# Patient Record
Sex: Female | Born: 1980 | Race: Black or African American | Hispanic: No | Smoking: Former smoker
Health system: Southern US, Community
[De-identification: ages and names within clinical notes are randomized; demographics above are authoritative.]

## PROBLEM LIST (undated history)

## (undated) DIAGNOSIS — H547 Unspecified visual loss: Secondary | ICD-10-CM

## (undated) DIAGNOSIS — K760 Fatty (change of) liver, not elsewhere classified: Secondary | ICD-10-CM

## (undated) DIAGNOSIS — H409 Unspecified glaucoma: Secondary | ICD-10-CM

## (undated) DIAGNOSIS — H3530 Unspecified macular degeneration: Secondary | ICD-10-CM

## (undated) DIAGNOSIS — G629 Polyneuropathy, unspecified: Secondary | ICD-10-CM

## (undated) DIAGNOSIS — H269 Unspecified cataract: Secondary | ICD-10-CM

## (undated) DIAGNOSIS — I10 Essential (primary) hypertension: Secondary | ICD-10-CM

## (undated) DIAGNOSIS — E785 Hyperlipidemia, unspecified: Secondary | ICD-10-CM

## (undated) DIAGNOSIS — D649 Anemia, unspecified: Secondary | ICD-10-CM

## (undated) DIAGNOSIS — F329 Major depressive disorder, single episode, unspecified: Secondary | ICD-10-CM

## (undated) DIAGNOSIS — F32A Depression, unspecified: Secondary | ICD-10-CM

## (undated) DIAGNOSIS — E119 Type 2 diabetes mellitus without complications: Secondary | ICD-10-CM

## (undated) HISTORY — PX: OTHER SURGICAL HISTORY: SHX169

## (undated) HISTORY — PX: RETINAL LASER PROCEDURE: SHX2339

## (undated) HISTORY — DX: Unspecified glaucoma: H40.9

## (undated) HISTORY — DX: Essential (primary) hypertension: I10

## (undated) HISTORY — DX: Anemia, unspecified: D64.9

## (undated) HISTORY — DX: Hyperlipidemia, unspecified: E78.5

---

## 2000-01-24 ENCOUNTER — Encounter: Admission: RE | Admit: 2000-01-24 | Discharge: 2000-04-23 | Payer: Self-pay | Admitting: Internal Medicine

## 2002-10-29 ENCOUNTER — Emergency Department (HOSPITAL_COMMUNITY): Admission: EM | Admit: 2002-10-29 | Discharge: 2002-10-29 | Payer: Self-pay | Admitting: Emergency Medicine

## 2002-11-21 ENCOUNTER — Emergency Department (HOSPITAL_COMMUNITY): Admission: EM | Admit: 2002-11-21 | Discharge: 2002-11-21 | Payer: Self-pay | Admitting: Emergency Medicine

## 2003-09-05 ENCOUNTER — Emergency Department (HOSPITAL_COMMUNITY): Admission: EM | Admit: 2003-09-05 | Discharge: 2003-09-05 | Payer: Self-pay | Admitting: Emergency Medicine

## 2004-04-12 ENCOUNTER — Emergency Department (HOSPITAL_COMMUNITY): Admission: EM | Admit: 2004-04-12 | Discharge: 2004-04-13 | Payer: Self-pay | Admitting: *Deleted

## 2004-05-26 ENCOUNTER — Emergency Department (HOSPITAL_COMMUNITY): Admission: EM | Admit: 2004-05-26 | Discharge: 2004-05-26 | Payer: Self-pay | Admitting: Emergency Medicine

## 2004-08-23 ENCOUNTER — Emergency Department (HOSPITAL_COMMUNITY): Admission: EM | Admit: 2004-08-23 | Discharge: 2004-08-23 | Payer: Self-pay | Admitting: Emergency Medicine

## 2004-10-01 ENCOUNTER — Emergency Department (HOSPITAL_COMMUNITY): Admission: EM | Admit: 2004-10-01 | Discharge: 2004-10-01 | Payer: Self-pay | Admitting: Emergency Medicine

## 2005-01-31 ENCOUNTER — Emergency Department (HOSPITAL_COMMUNITY): Admission: EM | Admit: 2005-01-31 | Discharge: 2005-01-31 | Payer: Self-pay | Admitting: Emergency Medicine

## 2005-07-19 ENCOUNTER — Emergency Department (HOSPITAL_COMMUNITY): Admission: EM | Admit: 2005-07-19 | Discharge: 2005-07-19 | Payer: Self-pay | Admitting: Emergency Medicine

## 2005-10-14 ENCOUNTER — Emergency Department (HOSPITAL_COMMUNITY): Admission: EM | Admit: 2005-10-14 | Discharge: 2005-10-14 | Payer: Self-pay | Admitting: Emergency Medicine

## 2006-02-03 ENCOUNTER — Emergency Department (HOSPITAL_COMMUNITY): Admission: EM | Admit: 2006-02-03 | Discharge: 2006-02-03 | Payer: Self-pay | Admitting: Emergency Medicine

## 2006-02-06 ENCOUNTER — Emergency Department (HOSPITAL_COMMUNITY): Admission: EM | Admit: 2006-02-06 | Discharge: 2006-02-06 | Payer: Self-pay | Admitting: Emergency Medicine

## 2006-02-07 ENCOUNTER — Ambulatory Visit: Payer: Self-pay | Admitting: Internal Medicine

## 2006-02-08 ENCOUNTER — Emergency Department (HOSPITAL_COMMUNITY): Admission: EM | Admit: 2006-02-08 | Discharge: 2006-02-08 | Payer: Self-pay | Admitting: Emergency Medicine

## 2006-02-11 ENCOUNTER — Ambulatory Visit: Payer: Self-pay | Admitting: Internal Medicine

## 2006-02-14 ENCOUNTER — Ambulatory Visit: Payer: Self-pay | Admitting: Internal Medicine

## 2006-02-17 ENCOUNTER — Ambulatory Visit: Payer: Self-pay | Admitting: Internal Medicine

## 2006-02-19 ENCOUNTER — Ambulatory Visit: Payer: Self-pay | Admitting: Internal Medicine

## 2006-02-24 ENCOUNTER — Ambulatory Visit: Payer: Self-pay | Admitting: Internal Medicine

## 2006-02-26 ENCOUNTER — Ambulatory Visit: Payer: Self-pay | Admitting: Internal Medicine

## 2006-03-05 ENCOUNTER — Ambulatory Visit: Payer: Self-pay | Admitting: Internal Medicine

## 2006-03-11 ENCOUNTER — Ambulatory Visit: Payer: Self-pay | Admitting: Family Medicine

## 2006-03-21 ENCOUNTER — Ambulatory Visit: Payer: Self-pay | Admitting: Internal Medicine

## 2006-08-27 ENCOUNTER — Ambulatory Visit: Payer: Self-pay | Admitting: Internal Medicine

## 2006-12-03 ENCOUNTER — Emergency Department (HOSPITAL_COMMUNITY): Admission: EM | Admit: 2006-12-03 | Discharge: 2006-12-03 | Payer: Self-pay | Admitting: Emergency Medicine

## 2007-02-03 ENCOUNTER — Emergency Department (HOSPITAL_COMMUNITY): Admission: EM | Admit: 2007-02-03 | Discharge: 2007-02-03 | Payer: Self-pay | Admitting: Emergency Medicine

## 2007-11-05 ENCOUNTER — Emergency Department (HOSPITAL_COMMUNITY): Admission: EM | Admit: 2007-11-05 | Discharge: 2007-11-05 | Payer: Self-pay | Admitting: Emergency Medicine

## 2008-08-15 ENCOUNTER — Emergency Department (HOSPITAL_COMMUNITY): Admission: EM | Admit: 2008-08-15 | Discharge: 2008-08-15 | Payer: Self-pay | Admitting: Emergency Medicine

## 2008-08-29 ENCOUNTER — Inpatient Hospital Stay (HOSPITAL_COMMUNITY): Admission: EM | Admit: 2008-08-29 | Discharge: 2008-09-03 | Payer: Self-pay | Admitting: Emergency Medicine

## 2008-08-31 ENCOUNTER — Ambulatory Visit: Payer: Self-pay | Admitting: Gastroenterology

## 2008-09-01 ENCOUNTER — Ambulatory Visit: Payer: Self-pay | Admitting: Internal Medicine

## 2008-09-03 ENCOUNTER — Ambulatory Visit: Payer: Self-pay | Admitting: Internal Medicine

## 2009-01-18 ENCOUNTER — Emergency Department (HOSPITAL_COMMUNITY): Admission: EM | Admit: 2009-01-18 | Discharge: 2009-01-18 | Payer: Self-pay | Admitting: Emergency Medicine

## 2009-02-07 ENCOUNTER — Emergency Department (HOSPITAL_COMMUNITY): Admission: EM | Admit: 2009-02-07 | Discharge: 2009-02-07 | Payer: Self-pay | Admitting: Emergency Medicine

## 2009-05-13 ENCOUNTER — Emergency Department (HOSPITAL_COMMUNITY): Admission: EM | Admit: 2009-05-13 | Discharge: 2009-05-13 | Payer: Self-pay | Admitting: Emergency Medicine

## 2009-07-10 ENCOUNTER — Emergency Department (HOSPITAL_COMMUNITY): Admission: EM | Admit: 2009-07-10 | Discharge: 2009-07-10 | Payer: Self-pay | Admitting: Emergency Medicine

## 2009-07-17 ENCOUNTER — Emergency Department (HOSPITAL_COMMUNITY): Admission: EM | Admit: 2009-07-17 | Discharge: 2009-07-17 | Payer: Self-pay | Admitting: Emergency Medicine

## 2009-07-25 ENCOUNTER — Emergency Department (HOSPITAL_COMMUNITY): Admission: EM | Admit: 2009-07-25 | Discharge: 2009-07-25 | Payer: Self-pay | Admitting: Emergency Medicine

## 2009-08-08 ENCOUNTER — Emergency Department (HOSPITAL_COMMUNITY): Admission: EM | Admit: 2009-08-08 | Discharge: 2009-08-08 | Payer: Self-pay | Admitting: Emergency Medicine

## 2010-04-03 ENCOUNTER — Emergency Department (HOSPITAL_COMMUNITY): Admission: EM | Admit: 2010-04-03 | Discharge: 2010-04-03 | Payer: Self-pay | Admitting: Emergency Medicine

## 2010-04-26 ENCOUNTER — Emergency Department (HOSPITAL_COMMUNITY): Admission: EM | Admit: 2010-04-26 | Discharge: 2010-04-26 | Payer: Self-pay | Admitting: Emergency Medicine

## 2011-02-02 ENCOUNTER — Inpatient Hospital Stay (INDEPENDENT_AMBULATORY_CARE_PROVIDER_SITE_OTHER)
Admission: RE | Admit: 2011-02-02 | Discharge: 2011-02-02 | Disposition: A | Discharge status: Home / Self Care | Payer: Self-pay | Source: Ambulatory Visit | Attending: Family Medicine | Admitting: Family Medicine

## 2011-02-02 DIAGNOSIS — L0100 Impetigo, unspecified: Secondary | ICD-10-CM

## 2011-02-02 LAB — POCT PREGNANCY, URINE: Preg Test, Ur: NEGATIVE

## 2011-03-09 LAB — RAPID STREP SCREEN (MED CTR MEBANE ONLY): Streptococcus, Group A Screen (Direct): NEGATIVE

## 2011-03-11 LAB — URINALYSIS, ROUTINE W REFLEX MICROSCOPIC
Bilirubin Urine: NEGATIVE
Glucose, UA: 500 mg/dL — AB
Hgb urine dipstick: NEGATIVE
Nitrite: NEGATIVE
Protein, ur: NEGATIVE mg/dL
Specific Gravity, Urine: >1.03 — ABNORMAL HIGH (ref 1.005–1.030)
Urobilinogen, UA: 0.2 mg/dL (ref 0.0–1.0)
pH: 5.5 (ref 5.0–8.0)

## 2011-03-11 LAB — POCT CARDIAC MARKERS
CKMB, poc: <1 ng/mL — ABNORMAL LOW (ref 1.0–8.0)
Myoglobin, poc: 33.4 ng/mL (ref 12–200)
Troponin i, poc: <0.05 ng/mL (ref 0.00–0.09)

## 2011-03-11 LAB — DIFFERENTIAL
Basophils Absolute: 0 10*3/uL (ref 0.0–0.1)
Basophils Relative: 1 % (ref 0–1)
Eosinophils Absolute: 0.1 10*3/uL (ref 0.0–0.7)
Eosinophils Relative: 1 % (ref 0–5)
Lymphocytes Relative: 38 % (ref 12–46)
Lymphs Abs: 2.8 10*3/uL (ref 0.7–4.0)
Monocytes Absolute: 0.4 10*3/uL (ref 0.1–1.0)
Monocytes Relative: 6 % (ref 3–12)
Neutro Abs: 4.1 10*3/uL (ref 1.7–7.7)
Neutrophils Relative %: 55 % (ref 43–77)

## 2011-03-11 LAB — CBC
HCT: 40.4 % (ref 36.0–46.0)
Hemoglobin: 14 g/dL (ref 12.0–15.0)
MCHC: 34.7 g/dL (ref 30.0–36.0)
MCV: 80.8 fL (ref 78.0–100.0)
Platelets: 233 10*3/uL (ref 150–400)
RBC: 5 MIL/uL (ref 3.87–5.11)
RDW: 14.7 % (ref 11.5–15.5)
WBC: 7.4 10*3/uL (ref 4.0–10.5)

## 2011-03-11 LAB — BASIC METABOLIC PANEL
BUN: 11 mg/dL (ref 6–23)
CO2: 24 mEq/L (ref 19–32)
Calcium: 9 mg/dL (ref 8.4–10.5)
Chloride: 106 mEq/L (ref 96–112)
Creatinine, Ser: 0.65 mg/dL (ref 0.4–1.2)
GFR calc Af Amer: >60 mL/min (ref >60)
GFR calc non Af Amer: >60 mL/min (ref >60)
Glucose, Bld: 276 mg/dL — ABNORMAL HIGH (ref 70–99)
Potassium: 4.2 mEq/L (ref 3.5–5.1)
Sodium: 138 mEq/L (ref 135–145)

## 2011-03-11 LAB — GLUCOSE, CAPILLARY: Glucose-Capillary: 278 mg/dL — ABNORMAL HIGH (ref 70–99)

## 2011-03-19 LAB — GLUCOSE, CAPILLARY: Glucose-Capillary: 195 mg/dL — ABNORMAL HIGH (ref 70–99)

## 2011-04-16 NOTE — Group Therapy Note (Signed)
NAME:  Rachel George, TAKEDA                ACCOUNT NO.:  [id number]   MEDICAL RECORD NO.:  [id number]          PATIENT TYPE:  INP   LOCATION:  A338                          FACILITY:  [facility]   PHYSICIAN:  Margaretmary Dys, M.D.DATE OF BIRTH:  31-Jul-1981   DATE OF PROCEDURE:  08/30/2008  DATE OF DISCHARGE:                                 PROGRESS NOTE   SUBJECTIVE:  The patient feels slightly better today.  She is having no  more vomiting but her nausea is still present.  She has had diarrhea,  but this has also improved.  The patient had an ultrasound today which  was unremarkable.  The patient is generally very unkempt due to poor  hygiene and her uncontrolled diabetes.   The patient also has some skin excoriation around her vulvar area,  likely due to Candida.   OBJECTIVE:  Conscious, alert, comfortable; not in acute distress.  Well-  oriented in time, place and person.  VITAL SIGNS:  Blood pressure 96/59, with a pulse of 117.  Respiratory is  20.  Temperature 98.5 degrees Fahrenheit.  Oxygen saturation was 98% on  room air.  Blood sugar is now down to 160.  HEENT:  Exam normocephalic, atraumatic.  Oral mucosa was moist.  Poor  oral dentition.  The patient is generally unkempt.  NECK:  Supple.  No JVD or lymphadenopathy.  LUNGS:  Clear clinically.  Good air entry bilaterally.  HEART:  S1 and S2, tachycardiac.  No S3, murmurs, gallops or rubs.  ABDOMEN:  Soft, obese.  Bowel sounds were positive.  No masses palpable.  EXTREMITIES:  No edema.  SKIN:  The patient has multiple small blistering lesions, also there  were some on the anterior chest with an exudate.  The patient also has  some excoriations around her vulva area.  CNS:  Exam was grossly intact with no focal neurological deficits.   LABORATORY/DIAGNOSTIC DATA:  White blood count 19.6, hemoglobin 13.6,  hematocrit 40.2, platelet count 130 with 89% neutrophils.  PT 15.2, INR  1.2.  Glucose levels range between 161-203.  Sodium  430, potassium 3.1,  chloride 98, CO2 22, glucose 229, BUN 36, creatinine 1.48.  AST 21, ALT  14, calcium 7.8, albumin 1.9.  Cholesterol 128, triglycerides 343, LDL  38.   ASSESSMENT AND PLAN:  1. Acute gastroenteritis, possibly viral.  2. Poorly controlled type 2 diabetes with nonketotic hyperglycemic      state.  3. Acute renal insufficiency, likely secondary to prerenal azotemia;      although cannot rule out possibility of early diabetic nephropathy.      The patient has protein in her urine.  4. Multiple skin infections with possible MRSA and also Candida      infection.  5. Sepsis  6. Dehydration.   PLAN:  1. Will continue IV fluid hydration with normal saline at 150 an hour.      I will replace potassium.  Renal ultrasound was negative.  Liver      function test unremarkable today.  2. Will continue current IV antibiotics with vancomycin and Zosyn.  3. We  will continue to monitor BUN and creatinine.  4. I will stop her insulin infusion and place her on a sliding scale      and also place her on Lantus insulin -- depending on how much      sliding scale she requires.  A hemoglobin A1c is not available at      this time.  5. Will continue antibiotics for urinary tract infection, as mentioned      above.  6. Will start the patient on nystatin cream b.i.d. for her vulvar skin      infection.   DISPOSITION:  The patient will remain on the medical floor for now.  We  will continue on IV fluids and continue to monitor her blood pressure.  Extensive counseling was provided to the patient regarding compliance to  the medications, dietary and lifestyle changes and weight loss.      Margaretmary Dys, M.D.  Electronically Signed     AM/MEDQ  D:  08/30/2008  T:  08/30/2008  Job:  [id number]

## 2011-04-16 NOTE — H&P (Signed)
NAME:  Rachel George, Rachel George                ACCOUNT NO.:  [id number]   MEDICAL RECORD NO.:  [id number]          PATIENT TYPE:  INP   LOCATION:  A338                          FACILITY:  [facility]   PHYSICIAN:  Osvaldo Shipper, MD     DATE OF BIRTH:  [birth year]   DATE OF ADMISSION:  08/29/2008  DATE OF DISCHARGE:  LH                              HISTORY & PHYSICAL   The patient does not have a PMD.   ADMISSION DIAGNOSES:  1. Gastroenteritis, acute.  2. Hyperglycemia, nonketotic.  3. Acute renal failure, likely secondary to prerenal azotemia.  4. Possible skin infection with methicillin resistant Staphylococcus      aureus.  5. Sepsis.  6. Severe Dehydration.   CHIEF COMPLAINT:  Nausea, vomiting, diarrhea, abdominal pain since three  days.   HISTORY OF PRESENT ILLNESS:  The patient is a 30 year old African  American female who is morbidly obese with a history of diabetes but she  has been untreated for the past four years.  The patient does not have a  PMD and has not been following up with anybody.  The patient was in her  usual state of health until about three days ago when she started  feeling bad.  She started feeling weak and this was followed by onset  of nausea and vomiting.  She has vomited numerous times, watery clear  liquid.  Then diarrhea started this morning.  She has had about five  episodes of watery stool which have been black in color.  She has been  taking Pepto Bismol which could account for the black stools.  She  denied any blood in the stools.  She had also been coughing green  expectoration, sometimes tinged with blood.  She denied any chills but  has been having fever but has not taken her temperature.  She has had  decreased p.o. intake as well for the past two days.  She also admits to  having some abdominal pain in the upper abdomen along with this nausea  and vomiting.  With the cough she experiences sharp chest pain but  otherwise does not have any pain.  She has  been having some shortness of  breath as well.  She denies any sick contacts.   She mentioned that she also had a boil on her chest wall area about two  days ago and that seems to be resolving.  She had a history of numerous  skin infections which have been I&D'd in the past.  All this is very  suspicious for MRSA.   MEDICATIONS AT HOME:  Nothing on a scheduled basis.  She used to be on  Glucophage in 2004 for her diabetes but has not taken anything since  then.  She is on Tylenol and Pepto Bismol as needed.   ALLERGIES:  SHELLFISH.   PAST MEDICAL HISTORY:  1. Positive for diabetes, untreated.  2. Hypertension.  3. Bronchitis.  4. Skin infections.   PAST SURGICAL HISTORY:  Local I&D's for the skin infections.  She still  has her gallbladder and appendix.   SOCIAL HISTORY:  She lives in [place] with significant other.  She  works at the [employer].  She smokes half a pack of cigarettes on a daily  basis.  Denies any alcohol use or illicit drug use.  Independent with  her daily activities.   FAMILY HISTORY:  Positive for diabetes.   REVIEW OF SYSTEMS:  GENERAL:  Positive for weakness, malaise.  HEENT:  Unremarkable.  CARDIOVASCULAR:  Unremarkable.  RESPIRATORY:  As in HPI.  GASTROINTESTINAL:  As in HPI.  GENITOURINARY:  Positive for decreased  quantity of urine and dark urine, otherwise unremarkable.  MUSCULOSKELETAL:  Unremarkable.  DERMATOLOGIC:  As in HPI.  PSYCHIATRIC:  Unremarkable.  NEUROLOGIC:  Unremarkable.  Other systems unremarkable.   PHYSICAL EXAMINATION:  VITAL SIGNS:  Temperature 98.2, blood pressure  109/53, heart rate when she came in was 153, respiratory rate 22,  saturation 97%.  Currently heart rate is in the 130s, irregular on the  monitor.  GENERAL:  This is a morbidly obese black female, looks a little bit sick  but in no distress.  HEENT:  There is no pallor, no icterus.  Oral mucous membranes very dry.  No oral lesions are noted.  NECK:  Soft and supple.   No thyromegaly is appreciated.  LUNGS:  Clear to auscultation bilaterally.  No wheezing, rales, or  rhonchi.  No dullness to percussion.  CARDIOVASCULAR:  Tachycardic, regular, no murmurs appreciated.  No S3 or  S4.  Heart sounds are somewhat distant because of her size.  ABDOMEN:  Soft.  There is tenderness in the epigastric and also over the  right upper quadrant.  No rebound or guarding is present.  She does have  normal bowel sounds.  No lower abdominal tenderness is elicited.  No  mass or organomegaly is present.  EXTREMITIES:  Did not show any edema.  Musculoskeletal exam otherwise  unremarkable.  DERMATOLOGIC:  Revealed multiple small punctate skin lesions.  The one  most prominent was her anterior chest where a small amount of exudate is  elicited.  There is no active drainage.  No significant erythema is  noted as well.  The area is tender to palpation.  NEUROLOGIC:  She is alert, oriented x3.  No focal neurological deficits  are present.   LABORATORY DATA:  Her white count was 23,500 with 92% neutrophils, more  than 20 bands  reported.  Toxic granulation is noted.  Hemoglobin is  14.9, platelet count is 141.  Coags are not being checked.  Sodium is  125, potassium is 3.8, chloride is 90, bicarb is 21, glucose is 495, BUN  is 34, creatinine is 1.96, GFR is 37, calcium is 8.1.  UA shows turbid  yellow urine, glucosuria, trace ketones, trace blood, some protein,  small leukocytes, 3-6 wbc's, many bacteria noted.   Imaging studies include chest x-ray which showed no evidence for  cardiopulmonary process.   She also had an EKG which shows sinus tachycardia rhythm with a rate of  140.  Axis was normal and intervals appear to be in the normal range.  No Q-waves are present.  No concerning ST or T-wave changes present.   ASSESSMENT:  This is a 30 year old African American female who has  untreated diabetes, was morbidly obese, and presents with a 3-day  history of nausea,  vomiting, diarrhea.  These symptoms are consistent  with gastroenteritis.  She also had right upper quadrant abdominal pain  which is worrisome for gallbladder disease considering her obesity and  the history of  diabetes.  She also has skin infections which is  concerning for MRSA.  She is very dehydrated  with acute renal failure.  She also has criteria fitting systemic inflammatory response syndrome  (SIRS) and possibly even sepsis. She has hyperglycemia due to untreated  diabetes.   PLAN:  1. Acute gastroenteritis will be treated symptomatically with      antiemetics and antidiarrheal agents.  Her black stool is because      of the Pepto Bismol and not because of any bleeding.  We will      monitor hemoglobin closely.  PPIs will be initiated.  2. Abdominal pain.  LFTs and lipase will be checked.  Ultrasound of      the abdomen will be ordered for tomorrow to evaluate for      cholecystitis.  A surgical consultation may be needed.  If the      ultrasound is negative and if she continues to have her symptoms a      GI consultation will be obtained and consideration to getting a CAT      scan will be given.  3. Systemic inflammatory response syndrome/Sepsis.  This was likely      from skin infections from possible urinary tract infection.  We      will put her on broad spectrum antibiotics and vancomycin for      presumed MRSA.  Blood cultures will be sent off.  Nasal swab for      MRSA screen will be done.  Swab from the skin will be sent off as      well.  Because of the possibility of gallbladder disease I will      also start her on Zosyn.  Once ultrasound comes back a narrow      spectrum treatment can be initiated. Aggresive fluid resuscitation      will be initiated.  4. Hyperglycemia.  Since she is having nausea and vomiting will      initiate a Glucommander which will provide a little bit more      consistent glycemic control.  5. Acute renal failure.  Likely from  dehydration.  We will initiate IV      fluids and monitor this closely.  6. Hyponatremia because of hypovolemia.  This will also be monitored      closely.   The patient has been counseled regarding her diabetes.  I have explained  to her that if she ignores her diabetes and does not get treated she  will end up getting multiple complications in the future.  All this has  been emphasized very strongly to the individual.   Stool studies will be sent off.  Contact precautions.  DVT prophylaxis.   Further management decisions will depend on results of further testing  and patient's response to treatment.      Osvaldo Shipper, MD  Electronically Signed     GK/MEDQ  D:  08/29/2008  T:  08/29/2008  Job:  [id number]

## 2011-04-16 NOTE — Group Therapy Note (Signed)
NAME:  Rachel George, SLAPE NO.:  [id number]   MEDICAL RECORD NO.:  [id number]          PATIENT TYPE:  INP   LOCATION:  A338                          FACILITY:  [facility]   PHYSICIAN:  Skeet Latch, DO    DATE OF BIRTH:  [birth year]   DATE OF PROCEDURE:  08/31/2008  DATE OF DISCHARGE:                                 PROGRESS NOTE   SUBJECTIVE:  The patient states that she still is having abdominal  discomfort with nausea and vomiting.  The patient states that she cannot  tolerate any liquids or solids at this time.  She has epigastric and  lower abdominal discomfort.  The patient also admits to having skin  lesions after been given Tramadol after going to the dentist for a  dental procedure.  The patient also admits to not being on diabetes  medications since 2004, has not seen a physician since then.   OBJECTIVE:  GENERAL:  The patient is awake, alert and in no acute  distress.  VITAL SIGNS:  Temperature is 96.7, pulse 101, respirations 22, blood  pressure 96/57.  HEENT:  Head is normocephalic, atraumatic.  Oral mucosa is moist.  Patient does have lesions on her face.  NECK:  Soft, supple, nontender, nondistended.  LUNGS:  Clear to auscultation bilaterally.  No rales, rhonchi or  wheezing.  HEART:  Regular rate and rhythm.  No murmurs, rubs or gallops.  ABDOMEN:  Soft, obese, positive bowel sounds.  She has some epigastric  and lower abdominal tenderness on deep palpation.  EXTREMITIES: No cyanosis, clubbing or edema.  SKIN:  She does have blistering lesions on anterior chest wall.  Excoriations around her vulva.   LABORATORY DATA:  Sodium 138, potassium 3.2, chloride 106, CO2 21,  glucose 254, BUN 23, creatinine 1.11.  White count is 16.3, hemoglobin  13.1, hematocrit 37.6, platelet count 112,000.   ASSESSMENT AND PLAN:  1. Abdominal/epigastric pain.  At this time will increase her Protonix      to 40 mg IV twice daily.  Will also get a gastroenterology  consultation regarding her abdominal pain and chronic nausea and      vomiting.  The patient's on a liquid diet and we will continue this      until she is evaluated by gastroenterology.  2. Poorly controlled type 2 diabetes with nonketotic hyperglycemic      state. Will change her sliding scale insulin to resistant as well      as get a hemoglobin A1C in the A.M.  Will also consult diabetes      team.  Patient will need close followup and aggressive therapy for      her diabetes secondary to her young age.  Patient will also need an      eye exam, probably as an outpatient, to evaluate her retina.  3. For her multiple skin infections, so far wound cultures show a few      Staphylococcus Aureus.  At this time we will continue with her      vancomycin and Zosyn.  The patient does have an elevated  white      count.  If this continues to be elevated we may need to adjust her      antibiotics.  4. For her dehydration, the patient's BUN and creatinine seem to be      within normal range.  Will continue with IV hydration.  Will      increase her IV secondary to her __________urinary output.  Will      continue to monitor closely.  5. For her hypokalemia we will continue to replace as needed at this      time.  6. Will continue to encourage compliance with her medications as well      as her diet and lifestyle as well as weight loss.      Skeet Latch, DO  Electronically Signed     SM/MEDQ  D:  08/31/2008  T:  08/31/2008  Job:  [id number]

## 2011-04-16 NOTE — Consult Note (Signed)
NAME:  Rachel George, Rachel George                ACCOUNT NO.:  [id number]   MEDICAL RECORD NO.:  [id number]          PATIENT TYPE:  INP   LOCATION:  A338                          FACILITY:  [facility]   PHYSICIAN:  Kassie Mends, M.D.      DATE OF BIRTH:  [birth year]   DATE OF CONSULTATION:  08/31/2008  DATE OF DISCHARGE:                                 CONSULTATION   REASON FOR CONSULTATION:  Abdominal pain.   PHYSICIAN REQUESTING CONSULTATION:  Skeet Latch, DO with [facility].   HISTORY OF PRESENT ILLNESS:  The patient is a 30 year old African  American female with history of diabetes mellitus, who has been  noncompliant for the past 5 years, who was admitted with acute-onset  nausea, vomiting, diarrhea.  The patient states that she was not feeling  well last week.  She had been on some pain pills including tramadol and  hydrocodone for her toothache over the past couple of weeks.  She  thought the medication was causing her nausea.  By the weekend, she was  having severe nausea and started vomiting.  She had several episodes.  Denies any hematemesis.  She then developed diarrhea.  This persisted  for several days.  She finally sought medical attention the day of  admission.  She states she does have chronic early satiety.  She does  have chronic daily heartburn for which she takes an occasional Rolaids.  Denies any dysphagia or odynophagia.  She has chronic alternating  constipation and diarrhea.  With this episode of vomiting, she has had  some epigastric pain.  The pain is persistent.  Her vomiting is  improving at this point since admission, but then she still had some  vomiting yesterday.  She mainly consumes ibuprofen, sometimes 6 or more  pills daily for various aches and pain, and predominantly for  toothaches.  She does not always do it daily, but does it quite  frequently.  She states she has not taken any of her medications for  diabetes in over 5 years.  She has not been trying  to control it with  her diet either.   MEDICATIONS AT HOME:  1. Tylenol p.r.n.  2. Pepto-Bismol p.r.n.  3. Rolaids p.r.n.  4. Ibuprofen up to 6 daily.   ALLERGIES:  SHELLFISH.   PAST MEDICAL HISTORY:  1. Diabetes mellitus.  She has been noncompliant for over 5 years.  2. Hypertension.  3. Skin infections with history of I&D in the past.  4. Bronchitis.   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  She  smokes half pack of cigarettes daily.  Denies alcohol or drug use.  She  lives with her significant other here in [place].   FAMILY HISTORY:  Negative for colorectal cancer, peptic ulcer disease.  She states her brother has chronic diarrhea with incontinence and is  currently being worked up.   REVIEW OF SYSTEMS:  GI:  Per HPI.  CONSTITUTIONAL:  Denies any weight  loss.  CARDIOPULMONARY:  No chest pain, shortness of breath, or  palpitations.  She  has had some coughing since her vomiting.  GENITOURINARY:  She has dysuria.  No hematuria.  She complains of  vaginal itching.   PHYSICAL EXAMINATION:  VITAL SIGNS:  Temperature 96.7, pulse 101,  respirations 22, blood pressure 86/57, height 67 inches, weight 143.6  kg.  GENERAL:  Pleasant, morbidly obese black female in no acute distress.  SKIN:  Warm and dry.  No jaundice.  HEENT:  Sclerae nonicteric.  Oropharyngeal mucosa moist and pink.  CHEST:  Lungs are clear to auscultation.  CARDIAC:  Regular rate and rhythm.  No murmurs, rubs, or gallops.  ABDOMEN:  Positive bowel sounds.  Abdomen is obese.  She has mild  epigastric tenderness to deep palpation.  No rebound or guarding.  No  organomegaly or masses appreciated, limited due to body habitus.  She  has multiple skin lesions in various stages of healing, which appear to  be boils.  LOWER EXTREMITIES:  No edema.   LABORATORY DATA:  Sodium 138, potassium 3.2, BUN 23, creatinine 1.11, on  admission it was 1.96, and glucose 254.  White count on admission  was  23,500 and today 16,300, hemoglobin 13.1 and platelets 112,000.  TSH  1.333, INR 1.2, magnesium 1.9, lipase less than 10.  Total bilirubin  0.8, alkaline phosphatase was 145, down to 131 today.  AST 21, ALT 14,  albumin was 1.8.  Urinalysis was positive for lots of bacteria, 3-6  wbc's, trace blood and ketones, small leukocytes, and negative nitrites.  Hemoglobin A1c was 12.4.  Blood cultures are pending.  Nasal Staph  screen is pending.  Wound culture is growing a few Staph aureus.  Mono  pending.   Abdominal ultrasound revealed a common bile duct diameter of 5 mm and  fatty infiltration.  Chest x-ray was negative.   IMPRESSION:  The patient is a 30 year old lady, noncompliant diabetic  with 7-10-day history of nausea with vomiting.  She also had diarrhea  over the weekend, which has appeared to have resolved.  She may have had  acute gastroenteritis on top of chronic gastroparesis, gastroesophageal  reflux disease, or even peptic ulcer disease, given her history of  significant Advil use.  Based on her abdominal ultrasound, it does not  appear that she has acute cholecystitis.  The source of her significant  leukocytosis is unclear at this time.  The patient is being covered with  Zosyn, vancomycin.  Leukocytosis is improving.   RECOMMENDATIONS:  1. Agree with PPI b.i.d.  2. Replete potassium.  3. We would hold on EGD for now, but will consider prior to discharge.      We would like her current infectious process to be controlled, and      I do not have any clear evidence at this time that it is GI in      etiology.  If her abdominal pain does not improve with increase in      PPI therapy, she may need a CT of the abdomen and pelvis      as well.  4. We will monitor the patient over the next 24 hours.  Further      recommendations to follow.  I have discussed this case with Dr.      Cira Servant, who is in agreement with the plan.      Tana Coast, P.A.      Kassie Mends,  M.D.  Electronically Signed    LL/MEDQ  D:  08/31/2008  T:  09/01/2008  Job:  [id number]

## 2011-04-19 NOTE — Discharge Summary (Signed)
NAME:  Rachel George, AMMONS NO.:  [id number]   MEDICAL RECORD NO.:  [id number]          PATIENT TYPE:  INP   LOCATION:  A338                          FACILITY:  [facility]   PHYSICIAN:  Skeet Latch, DO    DATE OF BIRTH:  Mar 21, 1981   DATE OF ADMISSION:  08/29/2008  DATE OF DISCHARGE:  10/03/2009LH                               DISCHARGE SUMMARY   DISCHARGE DIAGNOSES:  1. Abdominal pain.  2. Poorly controlled type 2 diabetes.  3. Multiple skin infections.  4. Dehydration resolved.  5. Hypokalemia.   BRIEF HOSPITAL COURSE:  This is a 30 year old African American female  morbidly obese with history of diabetes treated for 4 years who  presented with some weakness, nausea, vomiting, and abdominal pain.  The  patient states that 3 days prior to be admitted, she was feeling weak,  then began to have nausea and vomiting and diarrhea.  The patient had  approximately 5 episodes of watery stools and became black in color.  The patient also admitted to having coughing spells that were  productive, but denied any chills or fevers.  The patient states she did  began to have abdominal pain in her upper abdomen with some nausea and  vomiting and some sharp chest discomfort.  The patient has history of  multiple skin infections.  She had boil on her chest area about 2 days  prior, this seem to be resolving.  The patient does have a history of an  IUD in the past that was suspicious for MRSA.  The patient presented  with above symptoms.  Initial labs showed a white count 23,500 with 92  neutrophils, hemoglobin 40.9, platelets 141.  Sodium was 125, potassium  3.8, glucose 495, BUN 34, creatinine 1.96.  Imaging studies showed no  acute changes.  EKG showed she was in sinus tachycardia.  The patient  was admitted for gastroenteritis, placed on antiemetics, antidiarrheals,  as well as PPI.  For abdominal pain, her LFTs and lipase were checked.  She had ultrasound of her abdomen, which  showed no evidence of  gallstones of biliary dilatation.  It showed a diffuse fatty  infiltration of the liver.   OTHER RADIOLOGIC STUDIES:  The patient did have a CT of her abdomen and  pelvis, which showed no acute findings.  It did show a 5.5-cm right  ovarian cyst slightly represent functional ovarian cysts.  Recommend  followup pelvic ultrasound in 6-8 weeks.  Secondary to her abdominal  pain, nausea, and vomiting, Gastroenterology was consulted who felt that  she may have acute gastroenteritis, chronic gastroparesis with GERD or  possible positive ulcer disease.  I recommended continue the patient on  her IV antibiotics as well as her PPI twice a day.  Her potassium was  repleted.  Recommended no EGD at that time.  The patient's symptoms  slowly recovered.  The patient was placed on IV insulin on discharge as  well as a sliding scale.  The patient was told to follow up with a  primary care physician.   Her medications on discharge include Lantus 50  units subcu daily,  Bactrim DS twice a day for 10 days, Prilosec 40 mg daily, and Humalog  sliding scale.   The patient is told to follow up with her primary care physician and the  patient also was told that she needed diabetes teaching.  The patient  was discharged to home in stable condition.      Skeet Latch, DO  Electronically Signed     SM/MEDQ  D:  10/19/2008  T:  10/19/2008  Job:  [id number]

## 2011-08-03 ENCOUNTER — Emergency Department (HOSPITAL_COMMUNITY)
Admission: EM | Admit: 2011-08-03 | Discharge: 2011-08-03 | Disposition: A | Discharge status: Home / Self Care | Payer: Self-pay | Attending: Emergency Medicine | Admitting: Emergency Medicine

## 2011-08-03 DIAGNOSIS — Z91199 Patient's noncompliance with other medical treatment and regimen due to unspecified reason: Secondary | ICD-10-CM | POA: Insufficient documentation

## 2011-08-03 DIAGNOSIS — R42 Dizziness and giddiness: Secondary | ICD-10-CM | POA: Insufficient documentation

## 2011-08-03 DIAGNOSIS — Z794 Long term (current) use of insulin: Secondary | ICD-10-CM | POA: Insufficient documentation

## 2011-08-03 DIAGNOSIS — E119 Type 2 diabetes mellitus without complications: Secondary | ICD-10-CM | POA: Insufficient documentation

## 2011-08-03 DIAGNOSIS — E669 Obesity, unspecified: Secondary | ICD-10-CM | POA: Insufficient documentation

## 2011-08-03 DIAGNOSIS — I10 Essential (primary) hypertension: Secondary | ICD-10-CM | POA: Insufficient documentation

## 2011-08-03 DIAGNOSIS — Z9119 Patient's noncompliance with other medical treatment and regimen: Secondary | ICD-10-CM | POA: Insufficient documentation

## 2011-08-03 LAB — GLUCOSE, CAPILLARY: Glucose-Capillary: 295 mg/dL — ABNORMAL HIGH (ref 70–99)

## 2011-09-02 LAB — PHOSPHORUS: Phosphorus: 3.3

## 2011-09-02 LAB — GLUCOSE, CAPILLARY
Glucose-Capillary: 131 — ABNORMAL HIGH
Glucose-Capillary: 192 — ABNORMAL HIGH
Glucose-Capillary: 196 — ABNORMAL HIGH
Glucose-Capillary: 266 — ABNORMAL HIGH
Glucose-Capillary: 291 — ABNORMAL HIGH
Glucose-Capillary: 307 — ABNORMAL HIGH
Glucose-Capillary: 373 — ABNORMAL HIGH
Glucose-Capillary: 375 — ABNORMAL HIGH
Glucose-Capillary: 404 — ABNORMAL HIGH
Glucose-Capillary: 408 — ABNORMAL HIGH
Glucose-Capillary: 193 — ABNORMAL HIGH
Glucose-Capillary: 193 — ABNORMAL HIGH
Glucose-Capillary: 196 — ABNORMAL HIGH
Glucose-Capillary: 328 — ABNORMAL HIGH
Glucose-Capillary: 329 — ABNORMAL HIGH

## 2011-09-02 LAB — URINALYSIS, ROUTINE W REFLEX MICROSCOPIC
Bilirubin Urine: NEGATIVE
Glucose, UA: >1000 — AB
Leukocytes, UA: NEGATIVE
Nitrite: NEGATIVE
Protein, ur: 30 — AB
Protein, ur: 30 — AB
Specific Gravity, Urine: 1.025
Urobilinogen, UA: 0.2
Urobilinogen, UA: 0.2
pH: 5

## 2011-09-02 LAB — CBC
HCT: 37.6
HCT: 40.2
HCT: 36.3
Hemoglobin: 13.6
Hemoglobin: 14.9
Hemoglobin: 12.4
Hemoglobin: 12.6
MCHC: 33.4
MCHC: 34
MCHC: 34.2
MCV: 79.9
MCV: 80.7
Platelets: 112 — ABNORMAL LOW
Platelets: 130 — ABNORMAL LOW
Platelets: 141 — ABNORMAL LOW
Platelets: 158
RBC: 5.45 — ABNORMAL HIGH
RBC: 4.51
RBC: 4.95
RDW: 14
RDW: 14.3
RDW: 14.4
RDW: 14.6
RDW: 14.8
WBC: 19.6 — ABNORMAL HIGH
WBC: 13.2 — ABNORMAL HIGH

## 2011-09-02 LAB — URINE CULTURE
Colony Count: 30000
Colony Count: >=100000
Special Requests: NEGATIVE

## 2011-09-02 LAB — HEPATIC FUNCTION PANEL
Albumin: 2.2 — ABNORMAL LOW
Alkaline Phosphatase: 145 — ABNORMAL HIGH
Total Protein: 6.6

## 2011-09-02 LAB — BASIC METABOLIC PANEL
BUN: 23
BUN: 37 — ABNORMAL HIGH
CO2: 22
CO2: 24
Calcium: 7.7 — ABNORMAL LOW
Calcium: 8.1 — ABNORMAL LOW
Calcium: 8 — ABNORMAL LOW
Chloride: 106
Chloride: 96
Chloride: 102
Chloride: 103
Creatinine, Ser: 1.63 — ABNORMAL HIGH
Creatinine, Ser: 1.96 — ABNORMAL HIGH
Creatinine, Ser: 0.8
Creatinine, Ser: 0.98
GFR calc Af Amer: 37 — ABNORMAL LOW
GFR calc Af Amer: 45 — ABNORMAL LOW
GFR calc Af Amer: >60
GFR calc Af Amer: >60
GFR calc non Af Amer: 30 — ABNORMAL LOW
GFR calc non Af Amer: 59 — ABNORMAL LOW
GFR calc non Af Amer: >60
Glucose, Bld: 254 — ABNORMAL HIGH
Glucose, Bld: 198 — ABNORMAL HIGH
Glucose, Bld: 251 — ABNORMAL HIGH
Potassium: 3.2 — ABNORMAL LOW
Potassium: 3 — ABNORMAL LOW
Potassium: 3.3 — ABNORMAL LOW
Sodium: 125 — ABNORMAL LOW
Sodium: 134 — ABNORMAL LOW
Sodium: 135

## 2011-09-02 LAB — MAGNESIUM: Magnesium: 2.3

## 2011-09-02 LAB — LIPASE, BLOOD: Lipase: <10 — ABNORMAL LOW

## 2011-09-02 LAB — DIFFERENTIAL
Basophils Absolute: 0
Basophils Absolute: 0
Basophils Absolute: 0
Basophils Absolute: 0
Basophils Absolute: 0
Basophils Relative: 0
Basophils Relative: 0
Basophils Relative: 0
Eosinophils Absolute: 0.5
Eosinophils Absolute: 0.6
Eosinophils Absolute: 0.4
Eosinophils Relative: 1
Eosinophils Relative: 2
Eosinophils Relative: 4
Eosinophils Relative: 2
Lymphocytes Relative: 19
Lymphocytes Relative: 27
Lymphs Abs: 2.5
Monocytes Absolute: 0.2
Monocytes Absolute: 0.2
Monocytes Absolute: 0.2
Monocytes Absolute: 0.2
Monocytes Relative: 1 — ABNORMAL LOW
Monocytes Relative: 1 — ABNORMAL LOW
Monocytes Relative: 1 — ABNORMAL LOW
Monocytes Relative: 3
Neutro Abs: 8.6 — ABNORMAL HIGH
Neutro Abs: 9.8 — ABNORMAL HIGH
Neutrophils Relative %: 93 — ABNORMAL HIGH
Neutrophils Relative %: 70
Neutrophils Relative %: 74
WBC Morphology: INCREASED
WBC Morphology: INCREASED

## 2011-09-02 LAB — URINE MICROSCOPIC-ADD ON

## 2011-09-02 LAB — LIPID PANEL
Cholesterol: 128
HDL: 21 — ABNORMAL LOW
LDL Cholesterol: 38
Total CHOL/HDL Ratio: 6.1
Triglycerides: 343 — ABNORMAL HIGH

## 2011-09-02 LAB — COMPREHENSIVE METABOLIC PANEL
ALT: 14
Albumin: 1.9 — ABNORMAL LOW
Alkaline Phosphatase: 131 — ABNORMAL HIGH
Chloride: 98
Potassium: 3.1 — ABNORMAL LOW
Sodium: 130 — ABNORMAL LOW
Total Bilirubin: 0.8
Total Protein: 6.1

## 2011-09-02 LAB — HEMOGLOBIN A1C
Hgb A1c MFr Bld: 12.4 — ABNORMAL HIGH
Mean Plasma Glucose: 301

## 2011-09-02 LAB — CULTURE, BLOOD (ROUTINE X 2)
Culture: NO GROWTH
Culture: NO GROWTH
Report Status: 10032009

## 2011-09-02 LAB — WOUND CULTURE

## 2011-09-02 LAB — VANCOMYCIN, TROUGH: Vancomycin Tr: 17.2

## 2012-11-12 ENCOUNTER — Emergency Department (HOSPITAL_COMMUNITY)
Admission: EM | Admit: 2012-11-12 | Discharge: 2012-11-12 | Disposition: A | Discharge status: Home / Self Care | Payer: Self-pay | Attending: Emergency Medicine | Admitting: Emergency Medicine

## 2012-11-12 ENCOUNTER — Encounter (HOSPITAL_COMMUNITY): Payer: Self-pay

## 2012-11-12 DIAGNOSIS — E860 Dehydration: Secondary | ICD-10-CM

## 2012-11-12 DIAGNOSIS — R300 Dysuria: Secondary | ICD-10-CM | POA: Insufficient documentation

## 2012-11-12 DIAGNOSIS — R079 Chest pain, unspecified: Secondary | ICD-10-CM | POA: Insufficient documentation

## 2012-11-12 DIAGNOSIS — R5381 Other malaise: Secondary | ICD-10-CM | POA: Insufficient documentation

## 2012-11-12 DIAGNOSIS — R739 Hyperglycemia, unspecified: Secondary | ICD-10-CM

## 2012-11-12 DIAGNOSIS — R112 Nausea with vomiting, unspecified: Secondary | ICD-10-CM

## 2012-11-12 DIAGNOSIS — Z794 Long term (current) use of insulin: Secondary | ICD-10-CM | POA: Insufficient documentation

## 2012-11-12 DIAGNOSIS — R197 Diarrhea, unspecified: Secondary | ICD-10-CM | POA: Insufficient documentation

## 2012-11-12 DIAGNOSIS — E1169 Type 2 diabetes mellitus with other specified complication: Secondary | ICD-10-CM | POA: Insufficient documentation

## 2012-11-12 DIAGNOSIS — Z79899 Other long term (current) drug therapy: Secondary | ICD-10-CM | POA: Insufficient documentation

## 2012-11-12 DIAGNOSIS — R5383 Other fatigue: Secondary | ICD-10-CM | POA: Insufficient documentation

## 2012-11-12 DIAGNOSIS — N390 Urinary tract infection, site not specified: Secondary | ICD-10-CM

## 2012-11-12 HISTORY — DX: Type 2 diabetes mellitus without complications: E11.9

## 2012-11-12 LAB — URINALYSIS, ROUTINE W REFLEX MICROSCOPIC
Glucose, UA: 500 mg/dL — AB
Specific Gravity, Urine: 1.025 (ref 1.005–1.030)
pH: 5.5 (ref 5.0–8.0)

## 2012-11-12 LAB — CBC WITH DIFFERENTIAL/PLATELET
Eosinophils Absolute: 0 10*3/uL (ref 0.0–0.7)
Eosinophils Relative: 0 % (ref 0–5)
Hemoglobin: 14 g/dL (ref 12.0–15.0)
Lymphs Abs: 1.3 10*3/uL (ref 0.7–4.0)
MCH: 27.6 pg (ref 26.0–34.0)
MCV: 80.7 fL (ref 78.0–100.0)
Monocytes Relative: 12 % (ref 3–12)
RBC: 5.07 MIL/uL (ref 3.87–5.11)

## 2012-11-12 LAB — COMPREHENSIVE METABOLIC PANEL
AST: 13 U/L (ref 0–37)
Albumin: 2.9 g/dL — ABNORMAL LOW (ref 3.5–5.2)
Calcium: 9.4 mg/dL (ref 8.4–10.5)
Creatinine, Ser: 0.95 mg/dL (ref 0.50–1.10)
Total Protein: 7.7 g/dL (ref 6.0–8.3)

## 2012-11-12 LAB — GLUCOSE, CAPILLARY
Glucose-Capillary: 326 mg/dL — ABNORMAL HIGH (ref 70–99)
Glucose-Capillary: 296 mg/dL — ABNORMAL HIGH (ref 70–99)

## 2012-11-12 LAB — URINE MICROSCOPIC-ADD ON

## 2012-11-12 MED ORDER — LOPERAMIDE HCL 2 MG PO CAPS
4.0000 mg | ORAL_CAPSULE | Freq: Once | ORAL | Status: AC
  Administered 2012-11-12: 4 mg via ORAL
  Filled 2012-11-12: qty 2

## 2012-11-12 MED ORDER — DIPHENHYDRAMINE HCL 50 MG/ML IJ SOLN
25.0000 mg | Freq: Once | INTRAMUSCULAR | Status: AC
  Administered 2012-11-12: 25 mg via INTRAVENOUS
  Filled 2012-11-12: qty 1

## 2012-11-12 MED ORDER — CEFTRIAXONE SODIUM 1 G IJ SOLR
1.0000 g | Freq: Once | INTRAMUSCULAR | Status: AC
  Administered 2012-11-12: 1 g via INTRAVENOUS
  Filled 2012-11-12: qty 10

## 2012-11-12 MED ORDER — METOCLOPRAMIDE HCL 5 MG/ML IJ SOLN
10.0000 mg | Freq: Once | INTRAMUSCULAR | Status: AC
  Administered 2012-11-12: 10 mg via INTRAVENOUS
  Filled 2012-11-12: qty 2

## 2012-11-12 MED ORDER — CEPHALEXIN 500 MG PO CAPS: 500.0000 mg | ORAL_CAPSULE | Freq: Three times a day (TID) | ORAL | Status: DC

## 2012-11-12 MED ORDER — SODIUM CHLORIDE 0.9 % IV BOLUS (SEPSIS)
1000.0000 mL | Freq: Once | INTRAVENOUS | Status: AC
  Administered 2012-11-12: 1000 mL via INTRAVENOUS

## 2012-11-12 MED ORDER — ONDANSETRON HCL 4 MG/2ML IJ SOLN
4.0000 mg | Freq: Once | INTRAMUSCULAR | Status: AC
  Administered 2012-11-12: 4 mg via INTRAVENOUS
  Filled 2012-11-12: qty 2

## 2012-11-12 MED ORDER — PROMETHAZINE HCL 25 MG RE SUPP: 25.0000 mg | Freq: Four times a day (QID) | RECTAL | Status: DC | PRN

## 2012-11-12 MED ORDER — INSULIN ASPART 100 UNIT/ML IV SOLN
3.0000 [IU] | Freq: Once | INTRAVENOUS | Status: AC
  Administered 2012-11-12: 3 [IU] via INTRAVENOUS

## 2012-11-12 MED ORDER — FAMOTIDINE IN NACL 20-0.9 MG/50ML-% IV SOLN
20.0000 mg | Freq: Once | INTRAVENOUS | Status: AC
  Administered 2012-11-12: 20 mg via INTRAVENOUS
  Filled 2012-11-12: qty 50

## 2012-11-12 MED ORDER — SODIUM CHLORIDE 0.9 % IV SOLN
1000.0000 mL | INTRAVENOUS | Status: DC
  Administered 2012-11-12: 1000 mL via INTRAVENOUS

## 2012-11-12 MED ORDER — METFORMIN HCL 500 MG PO TABS: 500.0000 mg | ORAL_TABLET | Freq: Two times a day (BID) | ORAL | Status: DC

## 2012-11-12 MED ORDER — SODIUM CHLORIDE 0.9 % IV SOLN
1000.0000 mL | Freq: Once | INTRAVENOUS | Status: AC
  Administered 2012-11-12: 1000 mL via INTRAVENOUS

## 2012-11-12 NOTE — ED Notes (Signed)
C/o abd pain n/v/d since Tuesday. Pt also reports stopping her Novolog and Lantus 2 months ago, " because I couldn't afford it"

## 2012-11-12 NOTE — ED Notes (Signed)
Pt ambulatory to restoom, tolerated well, states that she is feeling better,

## 2012-11-12 NOTE — ED Provider Notes (Signed)
History  This chart was scribed for Ward Givens, MD by Manuela Schwartz, ED scribe. This patient was seen in room APA10/[facility] and the patient's care was started at 1506.   CSN: [id number]  Arrival date & time 11/12/12  1506   First MD Initiated Contact with Patient 11/12/12 1538      Chief Complaint  Patient presents with  . Abdominal Pain  . Nausea  . Emesis  . Diarrhea   The history is provided by the patient. No language interpreter was used.   Rachel George is a 31 y.o. female who presents to the Emergency Department complaining of emesis, diarrhea, and constant upper diffuse abdominal pain right side worse than left over the past 3 days that rotates from side to side. She states at least 7 episodes per day of emesis and diarrhea each, no blood in either. She also c/p intermittent sharp mid sternal CP that lasts a few seconds. She states associated dizzy, weakness, subjective fever, chills, intermittent urinary incontinence, dysuria, and feels dehydrated. She denies SOB.   Shes currently out of Novolog and Lantus past 2 months, DM since 31 yo and began with pills but switched to insulin 3 years ago. States she can't afford.  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 Smoker 0.5 ppd, no alcohol  No PCP, goes to [facility]   Past Medical History  Diagnosis Date  . Diabetes mellitus without complication     History reviewed. No pertinent past surgical history.  No family history on file.  History  Substance Use Topics  . Smoking status:  Smoker 1/2 ppd  . Smokeless tobacco: Not on file  . Alcohol Use: No  Employed Lives at home  OB History    Grav Para Term Preterm Abortions TAB SAB Ect Mult Living                  Review of Systems  Constitutional: Positive for fatigue. Negative for fever and chills.  Respiratory: Negative for shortness of breath.   Cardiovascular: Positive for chest pain.  Gastrointestinal:  Positive for nausea, vomiting, abdominal pain and diarrhea.  Genitourinary: Positive for dysuria.  Neurological: Negative for weakness.  All other systems reviewed and are negative.    Allergies  Shellfish allergy  Home Medications   Current Outpatient Rx  Name  Route  Sig  Dispense  Refill  . INSULIN ASPART 100 UNIT/ML [place] SOLN   Subcutaneous   Inject into the skin 3 (three) times daily before meals. As directed per sliding scale instructions         . INSULIN GLARGINE 100 UNIT/ML  SOLN   Subcutaneous   Inject 30 Units into the skin at bedtime.         Not taking for at least 2 months  Triage Vitals: BP 105/55  Pulse 114  Temp 98.8 F (37.1 C) (Oral)  Resp 16  SpO2 98%  Vital signs normal except tachycardia in low but normal blood pressure   Physical Exam  Nursing note and vitals reviewed. Constitutional: She is oriented to person, place, and time. She appears well-developed and well-nourished.  Non-toxic appearance. She does not appear ill. No distress.       Obese Looks like she feels bad  HENT:  Head: Normocephalic and atraumatic.  Right Ear: External ear normal.  Left Ear: External ear normal.  Nose: Nose normal. No mucosal edema or rhinorrhea.  Mouth/Throat: Mucous membranes  are normal. No dental abscesses or uvula swelling.       Dry mucous membranes  Eyes: Conjunctivae normal and EOM are normal. Pupils are equal, round, and reactive to light.  Neck: Normal range of motion and full passive range of motion without pain. Neck supple.  Cardiovascular: Normal rate, regular rhythm and normal heart sounds.  Exam reveals no gallop and no friction rub.   No murmur heard. Pulmonary/Chest: Effort normal and breath sounds normal. No respiratory distress. She has no wheezes. She has no rhonchi. She has no rales. She exhibits no tenderness and no crepitus.  Abdominal: Soft. Normal appearance and bowel sounds are normal. She exhibits no distension. There is tenderness  (diffuse upper abdominal pain). There is no rebound and no guarding.    Musculoskeletal: Normal range of motion. She exhibits no edema and no tenderness.       Moves all extremities well.   Neurological: She is alert and oriented to person, place, and time. She has normal strength. No cranial nerve deficit.  Skin: Skin is warm, dry and intact. No rash noted. No erythema. No pallor.  Psychiatric: Her speech is normal and behavior is normal. Her mood appears not anxious.       Flat affect    ED Course  Procedures (including critical care time)  Medications  0.9 %  sodium chloride infusion (0 mL Intravenous Stopped 11/12/12 1645)    Followed by  0.9 %  sodium chloride infusion (1000 mL Intravenous New Bag/Given 11/12/12 2149)  ondansetron (ZOFRAN) injection 4 mg (4 mg Intravenous Given 11/12/12 1621)  famotidine (PEPCID) IVPB 20 mg (0 mg Intravenous Stopped 11/12/12 1700)  metoCLOPramide (REGLAN) injection 10 mg (10 mg Intravenous Given 11/12/12 1621)  diphenhydrAMINE (BENADRYL) injection 25 mg (25 mg Intravenous Given 11/12/12 1621)  sodium chloride 0.9 % bolus 1,000 mL (0 mL Intravenous Stopped 11/12/12 2029)  insulin aspart (novoLOG) injection 3 Units (3 Units Intravenous Given 11/12/12 1653)  cefTRIAXone (ROCEPHIN) 1 g in dextrose 5 % 50 mL IVPB (0 g Intravenous Stopped 11/12/12 1834)  loperamide (IMODIUM) capsule 4 mg (4 mg Oral Given 11/12/12 1926)  sodium chloride 0.9 % bolus 1,000 mL (0 mL Intravenous Stopped 11/12/12 2148)   DIAGNOSTIC STUDIES: Oxygen Saturation is 98% on room air, normal by my interpretation.    COORDINATION OF CARE: At 410 PM Discussed treatment plan with patient which includes pepcid, reglan, benadryl, IV fluids, nausea medicine, blood work, UA. Patient agrees.   Recheck 18:25 feeling better, had diarrhea since I saw her, feels she can take orals now. CBG improved to 296. Will give imodium. IV not working, hasn't gotten second bolus yet.   Recheck 20:10  feeling better, voided once since last seen and states would be okay with more IV fluids before going home.   Recheck 21:02 Pt states feeling well after 3rd liter of fluid and I recommended going to [facility] for her diabetes medicines and plan to D/C home.    No results found. Results for orders placed during the hospital encounter of 11/12/12  GLUCOSE, CAPILLARY      Component Value Range   Glucose-Capillary 326 (*) 70 - 99 mg/dL   Comment 1 Notify RN    COMPREHENSIVE METABOLIC PANEL      Component Value Range   Sodium 132 (*) 135 - 145 mEq/L   Potassium 4.5  3.5 - 5.1 mEq/L   Chloride 97  96 - 112 mEq/L   CO2 26  19 - 32 mEq/L  Glucose, Bld 335 (*) 70 - 99 mg/dL   BUN 14  6 - 23 mg/dL   Creatinine, Ser 1.61  0.50 - 1.10 mg/dL   Calcium 9.4  8.4 - 09.6 mg/dL   Total Protein 7.7  6.0 - 8.3 g/dL   Albumin 2.9 (*) 3.5 - 5.2 g/dL   AST 13  0 - 37 U/L   ALT 12  0 - 35 U/L   Alkaline Phosphatase 98  39 - 117 U/L   Total Bilirubin 0.3  0.3 - 1.2 mg/dL   GFR calc non Af Amer 78 (*) >90 mL/min   GFR calc Af Amer >90  >90 mL/min  CBC WITH DIFFERENTIAL      Component Value Range   WBC 11.4 (*) 4.0 - 10.5 K/uL   RBC 5.07  3.87 - 5.11 MIL/uL   Hemoglobin 14.0  12.0 - 15.0 g/dL   HCT 04.5  40.9 - 81.1 %   MCV 80.7  78.0 - 100.0 fL   MCH 27.6  26.0 - 34.0 pg   MCHC 34.2  30.0 - 36.0 g/dL   RDW 91.4  78.2 - 95.6 %   Platelets 138 (*) 150 - 400 K/uL   Neutrophils Relative 76  43 - 77 %   Neutro Abs 8.6 (*) 1.7 - 7.7 K/uL   Lymphocytes Relative 12  12 - 46 %   Lymphs Abs 1.3  0.7 - 4.0 K/uL   Monocytes Relative 12  3 - 12 %   Monocytes Absolute 1.4 (*) 0.1 - 1.0 K/uL   Eosinophils Relative 0  0 - 5 %   Eosinophils Absolute 0.0  0.0 - 0.7 K/uL   Basophils Relative 0  0 - 1 %   Basophils Absolute 0.0  0.0 - 0.1 K/uL  LIPASE, BLOOD      Component Value Range   Lipase 12  11 - 59 U/L  URINALYSIS, ROUTINE W REFLEX MICROSCOPIC      Component Value Range   Color, Urine YELLOW  YELLOW    APPearance CLOUDY (*) CLEAR   Specific Gravity, Urine 1.025  1.005 - 1.030   pH 5.5  5.0 - 8.0   Glucose, UA 500 (*) NEGATIVE mg/dL   Hgb urine dipstick MODERATE (*) NEGATIVE   Bilirubin Urine NEGATIVE  NEGATIVE   Ketones, ur TRACE (*) NEGATIVE mg/dL   Protein, ur 213 (*) NEGATIVE mg/dL   Urobilinogen, UA 0.2  0.0 - 1.0 mg/dL   Nitrite NEGATIVE  NEGATIVE   Leukocytes, UA SMALL (*) NEGATIVE  URINE MICROSCOPIC-ADD ON      Component Value Range   Squamous Epithelial / LPF FEW (*) RARE   WBC, UA 21-50  <3 WBC/hpf   RBC / HPF 7-10  <3 RBC/hpf   Bacteria, UA MANY (*) RARE  GLUCOSE, CAPILLARY      Component Value Range   Glucose-Capillary 296 (*) 70 - 99 mg/dL   Comment 1 Notify RN     Laboratory interpretation all normal except hyperglycemia without ketoacidosis, UTI, mild leukocytosis     1. Hyperglycemia   2. Nausea vomiting and diarrhea   3. Dehydration   4. UTI (lower urinary tract infection)      New Prescriptions   CEPHALEXIN (KEFLEX) 500 MG CAPSULE    Take 1 capsule (500 mg total) by mouth 3 (three) times daily.   METFORMIN (GLUCOPHAGE) 500 MG TABLET    Take 1 tablet (500 mg total) by mouth 2 (two) times daily with a meal.  PROMETHAZINE (PHENERGAN) 25 MG SUPPOSITORY    Place 1 suppository (25 mg total) rectally every 6 (six) hours as needed for nausea.    Plan discharge   Devoria Albe, MD, FACEP    MDM  I personally performed the services described in this documentation, which was scribed in my presence. The recorded information has been reviewed and considered.  Devoria Albe, MD, Armando Gang          Ward Givens, MD 11/12/12 [phone]

## 2012-11-12 NOTE — ED Notes (Signed)
Pt assisted to restroom, tolerated well, update given on plan of care, states that the nausea is feeling better.

## 2012-11-14 LAB — URINE CULTURE

## 2012-11-15 NOTE — ED Notes (Signed)
+  Urine. Patient treated with Keflex. Sensitive to same. Per protocol MD. °

## 2014-04-20 ENCOUNTER — Emergency Department (HOSPITAL_COMMUNITY)
Admission: EM | Admit: 2014-04-20 | Discharge: 2014-04-20 | Disposition: A | Discharge status: Home / Self Care | Payer: Self-pay | Attending: Emergency Medicine | Admitting: Emergency Medicine

## 2014-04-20 ENCOUNTER — Encounter (HOSPITAL_COMMUNITY): Payer: Self-pay | Admitting: Emergency Medicine

## 2014-04-20 DIAGNOSIS — E119 Type 2 diabetes mellitus without complications: Secondary | ICD-10-CM | POA: Insufficient documentation

## 2014-04-20 DIAGNOSIS — J3489 Other specified disorders of nose and nasal sinuses: Secondary | ICD-10-CM | POA: Insufficient documentation

## 2014-04-20 DIAGNOSIS — X58XXXA Exposure to other specified factors, initial encounter: Secondary | ICD-10-CM | POA: Insufficient documentation

## 2014-04-20 DIAGNOSIS — R5381 Other malaise: Secondary | ICD-10-CM | POA: Insufficient documentation

## 2014-04-20 DIAGNOSIS — Z79899 Other long term (current) drug therapy: Secondary | ICD-10-CM | POA: Insufficient documentation

## 2014-04-20 DIAGNOSIS — R6889 Other general symptoms and signs: Secondary | ICD-10-CM | POA: Insufficient documentation

## 2014-04-20 DIAGNOSIS — Y929 Unspecified place or not applicable: Secondary | ICD-10-CM | POA: Insufficient documentation

## 2014-04-20 DIAGNOSIS — S0500XA Injury of conjunctiva and corneal abrasion without foreign body, unspecified eye, initial encounter: Secondary | ICD-10-CM

## 2014-04-20 DIAGNOSIS — R110 Nausea: Secondary | ICD-10-CM | POA: Insufficient documentation

## 2014-04-20 DIAGNOSIS — R0982 Postnasal drip: Secondary | ICD-10-CM | POA: Insufficient documentation

## 2014-04-20 DIAGNOSIS — H109 Unspecified conjunctivitis: Secondary | ICD-10-CM

## 2014-04-20 DIAGNOSIS — S058X9A Other injuries of unspecified eye and orbit, initial encounter: Secondary | ICD-10-CM | POA: Insufficient documentation

## 2014-04-20 DIAGNOSIS — Y939 Activity, unspecified: Secondary | ICD-10-CM | POA: Insufficient documentation

## 2014-04-20 DIAGNOSIS — R5383 Other fatigue: Secondary | ICD-10-CM

## 2014-04-20 DIAGNOSIS — Z794 Long term (current) use of insulin: Secondary | ICD-10-CM | POA: Insufficient documentation

## 2014-04-20 HISTORY — DX: Unspecified macular degeneration: H35.30

## 2014-04-20 LAB — CBG MONITORING, ED: Glucose-Capillary: 384 mg/dL — ABNORMAL HIGH (ref 70–99)

## 2014-04-20 MED ORDER — TETRACAINE HCL 0.5 % OP SOLN
OPHTHALMIC | Status: AC
  Filled 2014-04-20: qty 2

## 2014-04-20 MED ORDER — FLUORESCEIN SODIUM 1 MG OP STRP
ORAL_STRIP | OPHTHALMIC | Status: AC
  Filled 2014-04-20: qty 1

## 2014-04-20 MED ORDER — HYDROCODONE-ACETAMINOPHEN 5-325 MG PO TABS: 1.0000 | ORAL_TABLET | ORAL | Status: DC | PRN

## 2014-04-20 MED ORDER — TOBRAMYCIN 0.3 % OP SOLN
2.0000 [drp] | Freq: Once | OPHTHALMIC | Status: AC
  Administered 2014-04-20: 2 [drp] via OPHTHALMIC
  Filled 2014-04-20: qty 5

## 2014-04-20 MED ORDER — ACETAMINOPHEN 500 MG PO TABS
1000.0000 mg | ORAL_TABLET | Freq: Once | ORAL | Status: AC
Start: 2014-04-20 — End: 2014-04-20
  Administered 2014-04-20: 1000 mg via ORAL
  Filled 2014-04-20: qty 2

## 2014-04-20 NOTE — ED Notes (Signed)
C/o left eye pain and swelling.

## 2014-04-20 NOTE — Discharge Instructions (Signed)
Your examination is consistent with a conjunctivitis as well as a corneal abrasion. The conjunctivitis is contagious. Please wash hands frequently. Please use cool compress to the left eye frequently during the day. Please use dark glasses, and with a brim. Please white count your surfaces with antibacterial cleaners. Please use Tylenol for mild pain, use Norco for more severe pain. Norco may cause drowsiness, please use with caution. Please use 2  Tobramycin eyedrops to the left eye every 4 hours for the next 5 days. Conjunctivitis Conjunctivitis is commonly called "pink eye." Conjunctivitis can be caused by bacterial or viral infection, allergies, or injuries. There is usually redness of the lining of the eye, itching, discomfort, and sometimes discharge. There may be deposits of matter along the eyelids. A viral infection usually causes a watery discharge, while a bacterial infection causes a yellowish, thick discharge. Pink eye is very contagious and spreads by direct contact. You may be given antibiotic eyedrops as part of your treatment. Before using your eye medicine, remove all drainage from the eye by washing gently with warm water and cotton balls. Continue to use the medication until you have awakened 2 mornings in a row without discharge from the eye. Do not rub your eye. This increases the irritation and helps spread infection. Use separate towels from other household members. Wash your hands with soap and water before and after touching your eyes. Use cold compresses to reduce pain and sunglasses to relieve irritation from light. Do not wear contact lenses or wear eye makeup until the infection is gone. SEEK MEDICAL CARE IF:   Your symptoms are not better after 3 days of treatment.  You have increased pain or trouble seeing.  The outer eyelids become very red or swollen. Document Released: 12/26/2004 Document Revised: 02/10/2012 Document Reviewed: 11/18/2005 [facility] Patient Information  2014 [employer], [place].  Corneal Abrasion The cornea is the clear covering at the front and center of the eye. When you look at the colored portion of the eye, you are looking through the cornea. It is a thin tissue made up of layers. The top layer is the most sensitive layer. A corneal abrasion happens if this layer is scratched or an injury causes it to come off.  HOME CARE  You may be given drops or a medicated cream. Use the medicine as told by your doctor.  A pressure patch may be put over the eye. If this is done, follow your doctor's instructions for when to remove the patch. Do not drive or use machines while the eye patch is on. Judging distances is hard to do with a patch on.  See your doctor for a follow-up exam if you are told to do so. It is very important that you keep this appointment. GET HELP IF:   You have pain, are sensitive to light, and have a scratchy feeling in one eye or both eyes.  Your pressure patch keeps getting loose. You can blink your eye under the patch.  You have fluid coming from your eye or the lids stick together in the morning.  You have the same symptoms in the morning that you did with the first abrasion. This could be days, weeks, or months after the first abrasion healed. MAKE SURE YOU:   Understand these instructions.  Will watch your condition.  Will get help right away if you are not doing well or get worse. Document Released: 05/06/2008 Document Revised: 09/08/2013 Document Reviewed: 07/26/2013 [facility] Patient Information 2014 [place], [place].

## 2014-04-20 NOTE — ED Provider Notes (Signed)
CSN: [id number]     Arrival date & time 04/20/14  0845 History   First MD Initiated Contact with Patient 04/20/14 0932     Chief Complaint  Patient presents with  . Eye Pain     (Consider location/radiation/quality/duration/timing/severity/associated sxs/prior Treatment) HPI Comments: Pt states that on yesterday she awaken to the vision in the left eye to be "fuzzy", and the eye mild to moderately painful. There was drainage and increase mucus present. She has tried OTC eye drop, but his has not been successful. No hx in injury. No hx of welding or grinding. No know exposure to chemicals.   Patient is a 33 y.o. female presenting with eye pain. The history is provided by the patient.  Eye Pain This is a new problem. The current episode started yesterday. The problem occurs constantly. Associated symptoms include congestion, fatigue and nausea. Pertinent negatives include no abdominal pain, arthralgias, chest pain, coughing, fever or neck pain.    Past Medical History  Diagnosis Date  . Diabetes mellitus without complication   . Macular degeneration    History reviewed. No pertinent past surgical history. History reviewed. No pertinent family history. History  Substance Use Topics  . Smoking status: Never Smoker   . Smokeless tobacco: Not on file  . Alcohol Use: No   OB History   Grav Para Term Preterm Abortions TAB SAB Ect Mult Living                 Review of Systems  Constitutional: Positive for fatigue. Negative for fever and activity change.       All ROS Neg except as noted in HPI  HENT: Positive for congestion, postnasal drip, rhinorrhea and sneezing. Negative for nosebleeds.   Eyes: Positive for photophobia, pain, discharge, redness and itching.  Respiratory: Negative for cough, shortness of breath and wheezing.   Cardiovascular: Negative for chest pain and palpitations.  Gastrointestinal: Positive for nausea. Negative for abdominal pain and blood in stool.    Genitourinary: Negative for dysuria, frequency and hematuria.  Musculoskeletal: Negative for arthralgias, back pain and neck pain.  Skin: Negative.   Neurological: Negative for dizziness, seizures and speech difficulty.  Psychiatric/Behavioral: Negative for hallucinations and confusion.      Allergies  Shellfish allergy  Home Medications   Prior to Admission medications   Medication Sig Start Date End Date Taking? Authorizing Provider  acetaminophen (TYLENOL) 500 MG tablet Take 1,000 mg by mouth every 6 (six) hours as needed for moderate pain.   Yes Historical Provider, MD  insulin detemir (LEVEMIR) 100 UNIT/ML injection Inject 25 Units into the skin at bedtime.   Yes Historical Provider, MD  loratadine (CLARITIN) 10 MG tablet Take 10 mg by mouth daily.   Yes Historical Provider, MD   BP 117/64  Pulse 95  Temp(Src) 98 F (36.7 C) (Oral)  Resp 18  Ht 5\' 7"  (1.702 m)  Wt 315 lb (142.883 kg)  BMI 49.32 kg/m2  SpO2 99%  LMP 03/22/2014 Physical Exam  Nursing note and vitals reviewed. Constitutional: She is oriented to person, place, and time. She appears well-developed and well-nourished.  Non-toxic appearance.  HENT:  Head: Normocephalic.  Right Ear: Tympanic membrane and external ear normal.  Left Ear: Tympanic membrane and external ear normal.  Eyes: EOM are normal. Pupils are equal, round, and reactive to light. Right eye exhibits no chemosis and no hordeolum. Left eye exhibits discharge. Left eye exhibits no chemosis and no hordeolum. No foreign body present in the left  eye. Right conjunctiva is not injected. Left conjunctiva is injected.  Slit lamp exam:      The left eye shows corneal abrasion and fluorescein uptake. The left eye shows no corneal ulcer, no foreign body, no hyphema and no anterior chamber bulge.    Neck: Normal range of motion. Neck supple. Carotid bruit is not present.  Cardiovascular: Normal rate, regular rhythm, normal heart sounds, intact distal  pulses and normal pulses.   Pulmonary/Chest: Breath sounds normal. No respiratory distress.  Abdominal: Soft. Bowel sounds are normal. There is no tenderness. There is no guarding.  Musculoskeletal: Normal range of motion.  Lymphadenopathy:       Head (right side): No submandibular adenopathy present.       Head (left side): No submandibular adenopathy present.    She has no cervical adenopathy.  Neurological: She is alert and oriented to person, place, and time. She has normal strength. No cranial nerve deficit or sensory deficit.  Skin: Skin is warm and dry.  Psychiatric: She has a normal mood and affect. Her speech is normal.    ED Course  Procedures (including critical care time) Labs Review Labs Reviewed  CBG MONITORING, ED - Abnormal; Notable for the following:    Glucose-Capillary 384 (*)    All other components within normal limits    Imaging Review No results found.   EKG Interpretation None      MDM Patient's examination reveals evidence of conjunctivitis as well as a shallow abrasion of the left cornea. The patient is treated with tobramycin eyedrops. She's been advised to use cool compresses. Have advised her to use dark glasses and hat with brim.  No history of any welding or grinding, And no sign of foreign body in the eye.    Final diagnoses:  None    **I have reviewed nursing notes, vital signs, and all appropriate lab and imaging results for this patient.    Kathie DikeHobson M Temple Ewart, PA-C 04/20/14 1825

## 2014-04-21 NOTE — ED Provider Notes (Signed)
Medical screening examination/treatment/procedure(s) were performed by non-physician practitioner and as supervising physician I was immediately available for consultation/collaboration.   EKG Interpretation None        Nina Hoar L Rickey Sadowski, MD 04/21/14 1508 

## 2014-04-21 NOTE — Care Management Note (Addendum)
Pt is a diabetic with no PCP and no money for supplies or meds, unless she can get her parents to help her, which they do when they can. She has a problem with her vision already. [facility] but states she has a problem getting there. [facility] with meds and supplies. Appointment made for next Wednesday. She will use [facility]  for transportation. [facility]. Suggested she apply for disability if she is not able to work, and to [facility] for Medicaid and SSI. May not qualify, but she can at least try.

## 2015-01-29 ENCOUNTER — Emergency Department (HOSPITAL_COMMUNITY)
Admission: EM | Admit: 2015-01-29 | Discharge: 2015-01-29 | Disposition: A | Discharge status: Home / Self Care | Payer: Self-pay | Attending: Emergency Medicine | Admitting: Emergency Medicine

## 2015-01-29 ENCOUNTER — Encounter (HOSPITAL_COMMUNITY): Payer: Self-pay | Admitting: *Deleted

## 2015-01-29 ENCOUNTER — Emergency Department (HOSPITAL_COMMUNITY): Payer: Self-pay

## 2015-01-29 DIAGNOSIS — Z720 Tobacco use: Secondary | ICD-10-CM | POA: Insufficient documentation

## 2015-01-29 DIAGNOSIS — R69 Illness, unspecified: Secondary | ICD-10-CM

## 2015-01-29 DIAGNOSIS — J111 Influenza due to unidentified influenza virus with other respiratory manifestations: Secondary | ICD-10-CM | POA: Insufficient documentation

## 2015-01-29 DIAGNOSIS — E119 Type 2 diabetes mellitus without complications: Secondary | ICD-10-CM | POA: Insufficient documentation

## 2015-01-29 DIAGNOSIS — Z8669 Personal history of other diseases of the nervous system and sense organs: Secondary | ICD-10-CM | POA: Insufficient documentation

## 2015-01-29 DIAGNOSIS — Z794 Long term (current) use of insulin: Secondary | ICD-10-CM | POA: Insufficient documentation

## 2015-01-29 LAB — RAPID STREP SCREEN (MED CTR MEBANE ONLY): STREPTOCOCCUS, GROUP A SCREEN (DIRECT): NEGATIVE

## 2015-01-29 LAB — CBG MONITORING, ED: Glucose-Capillary: 123 mg/dL — ABNORMAL HIGH (ref 70–99)

## 2015-01-29 MED ORDER — IBUPROFEN 800 MG PO TABS
800.0000 mg | ORAL_TABLET | Freq: Once | ORAL | Status: AC
  Administered 2015-01-29: 800 mg via ORAL
  Filled 2015-01-29: qty 1

## 2015-01-29 MED ORDER — OSELTAMIVIR PHOSPHATE 75 MG PO CAPS: 75.0000 mg | ORAL_CAPSULE | Freq: Two times a day (BID) | ORAL | Status: DC

## 2015-01-29 NOTE — Discharge Instructions (Signed)

## 2015-01-29 NOTE — ED Provider Notes (Signed)
CSN: [id number]     Arrival date & time 01/29/15  1317 History  This chart was scribed for Rachel OctaveStephen Sharlyne Koeneman, MD by Ronney LionSuzanne Le, ED Scribe. This patient was seen in room APA17/[facility]17 and the patient's care was started at 3:21 PM.      Chief Complaint  Patient presents with  . Influenza   The history is provided by the patient. No language interpreter was used.    HPI Comments: Rachel George is a 34 y.o. female with a history of well-controlled IDDM who presents to the Emergency Department complaining of flu-like symptoms that began yesterday morning; she was last normal when she went to bed 2 nights ago. Patient complains of a productive cough with dark brown sputum, 1 episode of post-tussive vomiting today and 6 episodes yesterday, subjective fever, abdominal pain, generalized myalgias, headache. She hasn't taken Tylenol or Motrin. 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 Patient has a keloid on her scalp which is baseline. She denies a history of asthma or COPD. She denies a history of cholecystectomy or appendix.  PCP health department Patient has NKDA.   Past Medical History  Diagnosis Date  . Diabetes mellitus without complication   . Macular degeneration    History reviewed. No pertinent past surgical history. History reviewed. No pertinent family history. History  Substance Use Topics  . Smoking status: Current Every Day Smoker    Types: Cigarettes  . Smokeless tobacco: Not on file  . Alcohol Use: No   OB History    No data available     Review of Systems  A complete 10 system review of systems was obtained and all systems are negative except as noted in the HPI and PMH.    Allergies  Shellfish allergy  Home Medications   Prior to Admission medications   Medication Sig Start Date End Date Taking? Authorizing Provider  acetaminophen (TYLENOL) 500 MG tablet Take 1,000 mg by mouth every 6 (six) hours as needed for moderate pain.    Yes Historical Provider, MD  guaifenesin (ROBITUSSIN) 100 MG/5ML syrup Take 200 mg by mouth 3 (three) times daily as needed for cough.   Yes Historical Provider, MD  ibuprofen (ADVIL,MOTRIN) 200 MG tablet Take 600 mg by mouth every 6 (six) hours as needed for mild pain.   Yes Historical Provider, MD  insulin aspart (NOVOLOG) 100 UNIT/ML injection Inject 15 Units into the skin 3 (three) times daily before meals.   Yes Historical Provider, MD  insulin detemir (LEVEMIR) 100 UNIT/ML injection Inject 25 Units into the skin at bedtime.   Yes Historical Provider, MD  HYDROcodone-acetaminophen (NORCO/VICODIN) 5-325 MG per tablet Take 1 tablet by mouth every 4 (four) hours as needed for moderate pain. Patient not taking: Reported on 01/29/2015 04/20/14   Kathie DikeHobson M Bryant, PA-C  oseltamivir (TAMIFLU) 75 MG capsule Take 1 capsule (75 mg total) by mouth every 12 (twelve) hours. 01/29/15   Rachel OctaveStephen Rachel Eggenberger, MD   BP 106/61 mmHg  Pulse 92  Temp(Src) 98.8 F (37.1 C) (Oral)  Resp 18  Ht 5\' 7"  (1.702 m)  Wt 320 lb (145.151 kg)  BMI 50.11 kg/m2  SpO2 98%  LMP 01/16/2015 Physical Exam  Constitutional: She is oriented to person, place, and time. She appears well-developed and well-nourished. No distress.  Congested voice.   HENT:  Head: Normocephalic and atraumatic.  Mouth/Throat: Oropharynx is clear and moist. No oropharyngeal exudate.  Ears and throat are clear. Large nontender keloid to  posterior scalp.   Eyes: Conjunctivae and EOM are normal. Pupils are equal, round, and reactive to light.  Neck: Normal range of motion. Neck supple.  No meningismus.  Cardiovascular: Normal rate, regular rhythm, normal heart sounds and intact distal pulses.   No murmur heard. Pulmonary/Chest: Effort normal and breath sounds normal. No respiratory distress. She exhibits tenderness.  Diminished breath sounds. Chest wall tenderness.   Abdominal: Soft. There is no tenderness. There is no rebound and no guarding.   Musculoskeletal: Normal range of motion. She exhibits no edema or tenderness.  Neurological: She is alert and oriented to person, place, and time. No cranial nerve deficit. She exhibits normal muscle tone. Coordination normal.  No ataxia on finger to nose bilaterally. No pronator drift. 5/5 strength throughout. CN 2-12 intact. Negative Romberg. Equal grip strength. Sensation intact. Gait is normal.   Skin: Skin is warm.  Psychiatric: She has a normal mood and affect. Her behavior is normal.  Nursing note and vitals reviewed.   ED Course  Procedures (including critical care time)  DIAGNOSTIC STUDIES: Oxygen Saturation is 99% on room air, normal by my interpretation.    COORDINATION OF CARE: 3:28 PM - Discussed treatment plan with pt at bedside which includes CXR, and pt agreed to plan.   Labs Review Labs Reviewed  CBG MONITORING, ED - Abnormal; Notable for the following:    Glucose-Capillary 123 (*)    All other components within normal limits  RAPID STREP SCREEN  CULTURE, GROUP A STREP   Imaging Review Dg Chest 2 View  01/29/2015   CLINICAL DATA:  Cough, fever, influenza  EXAM: CHEST  2 VIEW  COMPARISON:  08/08/2009  FINDINGS: Lungs are essentially clear. Prominent lung markings in the bilateral lower lobes likely reflect normal pulmonary markings when correlating with prior studies. No pleural effusion or pneumothorax.  The heart is normal in size.  Mild degenerative changes of the visualized thoracolumbar spine.  IMPRESSION: No evidence of acute cardiopulmonary disease.   Electronically Signed   By: Charline Bills M.D.   On: 01/29/2015 16:21     EKG Interpretation None      MDM   Final diagnoses:  Influenza-like illness   2 days of influenza-like symptoms with cough, congestion, body aches, chills, vomiting multiple sick contacts.  No distress. No meningismus. Breath sounds diminished. Chest x-ray negative. Rapid strep negative.  Patient nontoxic appearing.  Suspected influenza-like illness. Patient agreeable to try Tamiflu after discussing risks and benefits. Follow up with PCP. Return precautions discussed.  I personally performed the services described in this documentation, which was scribed in my presence. The recorded information has been reviewed and is accurate.   Rachel Octave, MD 01/29/15 2132

## 2015-01-29 NOTE — ED Notes (Signed)
Pt c/o flu like symptoms since Sat. Morning, states co-workers have had the flu; c/o chills, N/V, body aches

## 2015-01-29 NOTE — ED Notes (Signed)
Pt did fine on ambulation. She stated that she did feel a little weak about half way around the nurses station. Gave Pt water.

## 2015-02-09 LAB — CULTURE, GROUP A STREP

## 2015-02-20 ENCOUNTER — Encounter (HOSPITAL_COMMUNITY): Payer: Self-pay | Admitting: Emergency Medicine

## 2015-02-20 ENCOUNTER — Emergency Department (HOSPITAL_COMMUNITY)
Admission: EM | Admit: 2015-02-20 | Discharge: 2015-02-20 | Discharge status: Against Medical Advice | Payer: Self-pay | Attending: Emergency Medicine | Admitting: Emergency Medicine

## 2015-02-20 DIAGNOSIS — Z720 Tobacco use: Secondary | ICD-10-CM | POA: Insufficient documentation

## 2015-02-20 DIAGNOSIS — R1030 Lower abdominal pain, unspecified: Secondary | ICD-10-CM | POA: Insufficient documentation

## 2015-02-20 DIAGNOSIS — E119 Type 2 diabetes mellitus without complications: Secondary | ICD-10-CM | POA: Insufficient documentation

## 2015-02-20 HISTORY — DX: Fatty (change of) liver, not elsewhere classified: K76.0

## 2015-02-20 LAB — COMPREHENSIVE METABOLIC PANEL
ALT: 14 U/L (ref 0–35)
AST: 17 U/L (ref 0–37)
Albumin: 3.5 g/dL (ref 3.5–5.2)
Alkaline Phosphatase: 79 U/L (ref 39–117)
Anion gap: 7 (ref 5–15)
BUN: 11 mg/dL (ref 6–23)
CHLORIDE: 104 mmol/L (ref 96–112)
CO2: 27 mmol/L (ref 19–32)
Calcium: 8.8 mg/dL (ref 8.4–10.5)
Creatinine, Ser: 0.78 mg/dL (ref 0.50–1.10)
GFR calc Af Amer: >90 mL/min (ref >90)
GLUCOSE: 407 mg/dL — AB (ref 70–99)
Potassium: 4.2 mmol/L (ref 3.5–5.1)
SODIUM: 138 mmol/L (ref 135–145)
Total Bilirubin: 0.4 mg/dL (ref 0.3–1.2)
Total Protein: 7.2 g/dL (ref 6.0–8.3)

## 2015-02-20 LAB — URINALYSIS, ROUTINE W REFLEX MICROSCOPIC
BILIRUBIN URINE: NEGATIVE
Glucose, UA: >1000 mg/dL — AB
KETONES UR: NEGATIVE mg/dL
LEUKOCYTES UA: NEGATIVE
NITRITE: POSITIVE — AB
PROTEIN: NEGATIVE mg/dL
Specific Gravity, Urine: 1.015 (ref 1.005–1.030)
UROBILINOGEN UA: 0.2 mg/dL (ref 0.0–1.0)
pH: 6 (ref 5.0–8.0)

## 2015-02-20 LAB — CBC WITH DIFFERENTIAL/PLATELET
Basophils Absolute: 0 10*3/uL (ref 0.0–0.1)
Basophils Relative: 0 % (ref 0–1)
Eosinophils Absolute: 0.1 10*3/uL (ref 0.0–0.7)
Eosinophils Relative: 1 % (ref 0–5)
HCT: 41.9 % (ref 36.0–46.0)
Hemoglobin: 13.8 g/dL (ref 12.0–15.0)
LYMPHS PCT: 55 % — AB (ref 12–46)
Lymphs Abs: 4.3 10*3/uL — ABNORMAL HIGH (ref 0.7–4.0)
MCH: 27.1 pg (ref 26.0–34.0)
MCHC: 32.9 g/dL (ref 30.0–36.0)
MCV: 82.3 fL (ref 78.0–100.0)
Monocytes Absolute: 0.4 10*3/uL (ref 0.1–1.0)
Monocytes Relative: 5 % (ref 3–12)
Neutro Abs: 3.1 10*3/uL (ref 1.7–7.7)
Neutrophils Relative %: 39 % — ABNORMAL LOW (ref 43–77)
PLATELETS: 216 10*3/uL (ref 150–400)
RBC: 5.09 MIL/uL (ref 3.87–5.11)
RDW: 15.1 % (ref 11.5–15.5)
WBC: 7.9 10*3/uL (ref 4.0–10.5)

## 2015-02-20 LAB — URINE MICROSCOPIC-ADD ON

## 2015-02-20 LAB — PREGNANCY, URINE: Preg Test, Ur: NEGATIVE

## 2015-02-20 NOTE — ED Notes (Signed)
Pt reports lower abdominal pain, "cloudy" urine x2 days. Pt reports v/d as well. nad noted.

## 2015-02-20 NOTE — ED Notes (Signed)
Pt called three times for room placement.  No response.

## 2015-03-18 ENCOUNTER — Emergency Department (HOSPITAL_COMMUNITY)
Admission: EM | Admit: 2015-03-18 | Discharge: 2015-03-18 | Disposition: A | Discharge status: Home / Self Care | Payer: Self-pay | Attending: Emergency Medicine | Admitting: Emergency Medicine

## 2015-03-18 ENCOUNTER — Encounter (HOSPITAL_COMMUNITY): Payer: Self-pay | Admitting: Cardiology

## 2015-03-18 DIAGNOSIS — M25562 Pain in left knee: Secondary | ICD-10-CM

## 2015-03-18 DIAGNOSIS — G8929 Other chronic pain: Secondary | ICD-10-CM | POA: Insufficient documentation

## 2015-03-18 DIAGNOSIS — R519 Headache, unspecified: Secondary | ICD-10-CM

## 2015-03-18 DIAGNOSIS — R0981 Nasal congestion: Secondary | ICD-10-CM | POA: Insufficient documentation

## 2015-03-18 DIAGNOSIS — Z794 Long term (current) use of insulin: Secondary | ICD-10-CM | POA: Insufficient documentation

## 2015-03-18 DIAGNOSIS — Z720 Tobacco use: Secondary | ICD-10-CM | POA: Insufficient documentation

## 2015-03-18 DIAGNOSIS — Z79899 Other long term (current) drug therapy: Secondary | ICD-10-CM | POA: Insufficient documentation

## 2015-03-18 DIAGNOSIS — E119 Type 2 diabetes mellitus without complications: Secondary | ICD-10-CM | POA: Insufficient documentation

## 2015-03-18 DIAGNOSIS — Z8719 Personal history of other diseases of the digestive system: Secondary | ICD-10-CM | POA: Insufficient documentation

## 2015-03-18 DIAGNOSIS — R51 Headache: Secondary | ICD-10-CM | POA: Insufficient documentation

## 2015-03-18 MED ORDER — METOCLOPRAMIDE HCL 5 MG/ML IJ SOLN
10.0000 mg | Freq: Once | INTRAMUSCULAR | Status: AC
  Administered 2015-03-18: 10 mg via INTRAMUSCULAR
  Filled 2015-03-18: qty 2

## 2015-03-18 MED ORDER — CETIRIZINE HCL 10 MG PO CAPS: ORAL_CAPSULE | ORAL | Status: DC

## 2015-03-18 MED ORDER — NAPROXEN 500 MG PO TABS: 500.0000 mg | ORAL_TABLET | Freq: Two times a day (BID) | ORAL | Status: DC

## 2015-03-18 MED ORDER — KETOROLAC TROMETHAMINE 60 MG/2ML IM SOLN
60.0000 mg | Freq: Once | INTRAMUSCULAR | Status: AC
  Administered 2015-03-18: 60 mg via INTRAMUSCULAR
  Filled 2015-03-18: qty 2

## 2015-03-18 MED ORDER — DIPHENHYDRAMINE HCL 25 MG PO CAPS
25.0000 mg | ORAL_CAPSULE | Freq: Once | ORAL | Status: AC
  Administered 2015-03-18: 25 mg via ORAL
  Filled 2015-03-18: qty 1

## 2015-03-18 NOTE — ED Provider Notes (Signed)
CSN: [id number]     Arrival date & time 03/18/15  0846 History   First MD Initiated Contact with Patient 03/18/15 [phone]     Chief Complaint  Patient presents with  . Headache  . Knee Pain     (Consider location/radiation/quality/duration/timing/severity/associated sxs/prior Treatment) HPI   Rachel George is a 34 y.o. female who presents to the Emergency Department complaining of left sided headache that began yesterday.  She describes the headache as a throbbing sensation to her left temple. Pain is waxing and waning, Improved yesterday after taking Tylenol but symptoms returned last evening. She also reports intermittent problems with her allergies and notes sneezing runny nose and watery eyes for several days. She's been taking Benadryl for her allergy symptoms without relief. Patient also reports recurrent left knee pain that also began yesterday. She describes the pain as aching with excessive walking and bending. She denies injury.  She also denies fever, neck pain or stiffness, facial pain or numbness, vomiting or nausea.     Past Medical History  Diagnosis Date  . Diabetes mellitus without complication   . Macular degeneration   . Fatty liver disease, nonalcoholic    History reviewed. No pertinent past surgical history. History reviewed. No pertinent family history. History  Substance Use Topics  . Smoking status: Current Every Day Smoker -- 0.50 packs/day    Types: Cigarettes  . Smokeless tobacco: Not on file  . Alcohol Use: No   OB History    No data available     Review of Systems  Constitutional: Negative for fever and chills.  HENT: Positive for congestion, sinus pressure and sneezing.   Eyes: Positive for photophobia. Negative for pain and visual disturbance.  Respiratory: Negative for shortness of breath.   Gastrointestinal: Negative for nausea, vomiting and abdominal pain.  Musculoskeletal: Negative for joint swelling. Arthralgias: left knee pain.  Skin:  Negative for color change and wound.  Neurological: Positive for headaches. Negative for dizziness, syncope, speech difficulty, weakness and numbness.  Psychiatric/Behavioral: The patient is not nervous/anxious.   All other systems reviewed and are negative.     Allergies  Shellfish allergy  Home Medications   Prior to Admission medications   Medication Sig Start Date End Date Taking? Authorizing Provider  acetaminophen (TYLENOL) 500 MG tablet Take 1,000 mg by mouth every 6 (six) hours as needed for moderate pain.    Historical Provider, MD  guaifenesin (ROBITUSSIN) 100 MG/5ML syrup Take 200 mg by mouth 3 (three) times daily as needed for cough.    Historical Provider, MD  HYDROcodone-acetaminophen (NORCO/VICODIN) 5-325 MG per tablet Take 1 tablet by mouth every 4 (four) hours as needed for moderate pain. Patient not taking: Reported on 01/29/2015 04/20/14   Ivery QualeHobson Bryant, PA-C  ibuprofen (ADVIL,MOTRIN) 200 MG tablet Take 600 mg by mouth every 6 (six) hours as needed for mild pain.    Historical Provider, MD  insulin aspart (NOVOLOG) 100 UNIT/ML injection Inject 15 Units into the skin 3 (three) times daily before meals.    Historical Provider, MD  insulin detemir (LEVEMIR) 100 UNIT/ML injection Inject 25 Units into the skin at bedtime.    Historical Provider, MD  oseltamivir (TAMIFLU) 75 MG capsule Take 1 capsule (75 mg total) by mouth every 12 (twelve) hours. 01/29/15   Glynn OctaveStephen Rancour, MD   LMP 03/08/2015 Physical Exam  Constitutional: She is oriented to person, place, and time. She appears well-developed and well-nourished. No distress.  HENT:  Head: Normocephalic and atraumatic.  Right Ear:  Tympanic membrane and ear canal normal.  Left Ear: Tympanic membrane and ear canal normal.  Nose: Mucosal edema and rhinorrhea present. Right sinus exhibits no maxillary sinus tenderness and no frontal sinus tenderness. Left sinus exhibits no maxillary sinus tenderness and no frontal sinus  tenderness.  Mouth/Throat: Uvula is midline, oropharynx is clear and moist and mucous membranes are normal.  Eyes: EOM and lids are normal. Pupils are equal, round, and reactive to light. Left conjunctiva is injected.  Left conjunctiva slightly injected  Neck: Normal range of motion, full passive range of motion without pain and phonation normal. Neck supple. No spinous process tenderness and no muscular tenderness present. No rigidity. No Kernig's sign noted.  Cardiovascular: Normal rate, regular rhythm, normal heart sounds and intact distal pulses.   No murmur heard. Pulmonary/Chest: Effort normal and breath sounds normal. No respiratory distress.  Musculoskeletal: Normal range of motion. She exhibits tenderness. She exhibits no edema.  Diffuse tenderness with movement of the left patella.  Mild patellar crepitus.  No effusion, erythema or edema.  Pt has full ROM.    Neurological: She is alert and oriented to person, place, and time. She has normal strength. No cranial nerve deficit or sensory deficit. She exhibits normal muscle tone. Coordination and gait normal. GCS eye subscore is 4. GCS verbal subscore is 5. GCS motor subscore is 6.  Reflex Scores:      Tricep reflexes are 2+ on the right side and 2+ on the left side.      Bicep reflexes are 2+ on the right side and 2+ on the left side. Skin: Skin is warm and dry.  Psychiatric: She has a normal mood and affect.  Nursing note and vitals reviewed.   ED Course  Procedures (including critical care time) Labs Review Labs Reviewed - No data to display  Imaging Review No results found.   EKG Interpretation None      MDM   Final diagnoses:  Left-sided headache  Chronic knee pain, left    Patient reviewed on the [place] narcotics database, no recent prescriptions filed.  Pt is well appearing, non-toxic. No meningeal signs. Vitals area stable.  Headache is waxing and waning and I do not clinically suspect emergent process.   Likely sinus vs tension headache.  Knee pain is chronic and no concerning sx's for septic joint. NV intact.  Pt agrees to symptomatic tx and close PMD f/u  She is feeling better after medications and appears stable for d/c.  Agrees to arrange PMD f/u. Pt understands that narcotics are not clinically indicated.      Pauline Aus, PA-C 03/18/15 2018  Azalia Bilis, MD 03/19/15 [phone]

## 2015-03-18 NOTE — Discharge Instructions (Signed)

## 2015-03-18 NOTE — ED Notes (Signed)
Headache since yesterday and chronic left  knee pain that is worse.

## 2015-04-16 ENCOUNTER — Emergency Department (HOSPITAL_COMMUNITY)
Admission: EM | Admit: 2015-04-16 | Discharge: 2015-04-17 | Disposition: A | Discharge status: Home / Self Care | Payer: Self-pay | Attending: Emergency Medicine | Admitting: Emergency Medicine

## 2015-04-16 ENCOUNTER — Encounter (HOSPITAL_COMMUNITY): Payer: Self-pay | Admitting: Emergency Medicine

## 2015-04-16 ENCOUNTER — Emergency Department (HOSPITAL_COMMUNITY): Payer: Self-pay

## 2015-04-16 DIAGNOSIS — Z794 Long term (current) use of insulin: Secondary | ICD-10-CM | POA: Insufficient documentation

## 2015-04-16 DIAGNOSIS — N390 Urinary tract infection, site not specified: Secondary | ICD-10-CM | POA: Insufficient documentation

## 2015-04-16 DIAGNOSIS — Z791 Long term (current) use of non-steroidal anti-inflammatories (NSAID): Secondary | ICD-10-CM | POA: Insufficient documentation

## 2015-04-16 DIAGNOSIS — Z8669 Personal history of other diseases of the nervous system and sense organs: Secondary | ICD-10-CM | POA: Insufficient documentation

## 2015-04-16 DIAGNOSIS — Z8719 Personal history of other diseases of the digestive system: Secondary | ICD-10-CM | POA: Insufficient documentation

## 2015-04-16 DIAGNOSIS — E119 Type 2 diabetes mellitus without complications: Secondary | ICD-10-CM | POA: Insufficient documentation

## 2015-04-16 DIAGNOSIS — Z3202 Encounter for pregnancy test, result negative: Secondary | ICD-10-CM | POA: Insufficient documentation

## 2015-04-16 DIAGNOSIS — Z79899 Other long term (current) drug therapy: Secondary | ICD-10-CM | POA: Insufficient documentation

## 2015-04-16 DIAGNOSIS — R197 Diarrhea, unspecified: Secondary | ICD-10-CM | POA: Insufficient documentation

## 2015-04-16 DIAGNOSIS — Z720 Tobacco use: Secondary | ICD-10-CM | POA: Insufficient documentation

## 2015-04-16 LAB — CBC WITH DIFFERENTIAL/PLATELET
BASOS ABS: 0 10*3/uL (ref 0.0–0.1)
BASOS PCT: 0 % (ref 0–1)
EOS PCT: 2 % (ref 0–5)
Eosinophils Absolute: 0.1 10*3/uL (ref 0.0–0.7)
HCT: 39.7 % (ref 36.0–46.0)
HEMOGLOBIN: 13 g/dL (ref 12.0–15.0)
LYMPHS PCT: 63 % — AB (ref 12–46)
Lymphs Abs: 5 10*3/uL — ABNORMAL HIGH (ref 0.7–4.0)
MCH: 26.8 pg (ref 26.0–34.0)
MCHC: 32.7 g/dL (ref 30.0–36.0)
MCV: 81.9 fL (ref 78.0–100.0)
MONO ABS: 0.4 10*3/uL (ref 0.1–1.0)
Monocytes Relative: 6 % (ref 3–12)
NEUTROS PCT: 29 % — AB (ref 43–77)
Neutro Abs: 2.3 10*3/uL (ref 1.7–7.7)
PLATELETS: 201 10*3/uL (ref 150–400)
RBC: 4.85 MIL/uL (ref 3.87–5.11)
RDW: 14.6 % (ref 11.5–15.5)
WBC: 7.9 10*3/uL (ref 4.0–10.5)

## 2015-04-16 LAB — URINALYSIS, ROUTINE W REFLEX MICROSCOPIC
Bilirubin Urine: NEGATIVE
Glucose, UA: 500 mg/dL — AB
Ketones, ur: NEGATIVE mg/dL
Leukocytes, UA: NEGATIVE
NITRITE: POSITIVE — AB
PH: 6 (ref 5.0–8.0)
Specific Gravity, Urine: >1.03 — ABNORMAL HIGH (ref 1.005–1.030)
Urobilinogen, UA: 0.2 mg/dL (ref 0.0–1.0)

## 2015-04-16 LAB — COMPREHENSIVE METABOLIC PANEL
ALT: 11 U/L — ABNORMAL LOW (ref 14–54)
AST: 14 U/L — ABNORMAL LOW (ref 15–41)
Albumin: 3.1 g/dL — ABNORMAL LOW (ref 3.5–5.0)
Alkaline Phosphatase: 86 U/L (ref 38–126)
Anion gap: 7 (ref 5–15)
BUN: 12 mg/dL (ref 6–20)
CO2: 22 mmol/L (ref 22–32)
CREATININE: 0.67 mg/dL (ref 0.44–1.00)
Calcium: 8.4 mg/dL — ABNORMAL LOW (ref 8.9–10.3)
Chloride: 108 mmol/L (ref 101–111)
GFR calc Af Amer: >60 mL/min (ref >60)
GFR calc non Af Amer: >60 mL/min (ref >60)
Glucose, Bld: 299 mg/dL — ABNORMAL HIGH (ref 65–99)
POTASSIUM: 3.7 mmol/L (ref 3.5–5.1)
Sodium: 137 mmol/L (ref 135–145)
Total Bilirubin: 0.2 mg/dL — ABNORMAL LOW (ref 0.3–1.2)
Total Protein: 6.1 g/dL — ABNORMAL LOW (ref 6.5–8.1)

## 2015-04-16 LAB — URINE MICROSCOPIC-ADD ON

## 2015-04-16 LAB — PREGNANCY, URINE: Preg Test, Ur: NEGATIVE

## 2015-04-16 MED ORDER — SODIUM CHLORIDE 0.9 % IV SOLN
1000.0000 mL | INTRAVENOUS | Status: DC
  Administered 2015-04-17: 1000 mL via INTRAVENOUS

## 2015-04-16 MED ORDER — FENTANYL CITRATE (PF) 100 MCG/2ML IJ SOLN
50.0000 ug | Freq: Once | INTRAMUSCULAR | Status: AC
  Administered 2015-04-16: 50 ug via INTRAVENOUS
  Filled 2015-04-16: qty 2

## 2015-04-16 MED ORDER — ONDANSETRON HCL 4 MG/2ML IJ SOLN
4.0000 mg | Freq: Once | INTRAMUSCULAR | Status: AC
  Administered 2015-04-16: 4 mg via INTRAVENOUS
  Filled 2015-04-16: qty 2

## 2015-04-16 MED ORDER — SODIUM CHLORIDE 0.9 % IJ SOLN
INTRAMUSCULAR | Status: AC
  Filled 2015-04-16: qty 30

## 2015-04-16 MED ORDER — SODIUM CHLORIDE 0.9 % IV SOLN
1000.0000 mL | Freq: Once | INTRAVENOUS | Status: AC
  Administered 2015-04-17: 1000 mL via INTRAVENOUS

## 2015-04-16 NOTE — ED Provider Notes (Signed)
CSN: [id number]     Arrival date & time 04/16/15  2139 History  This chart was scribed for Devoria AlbeIva Kaelen Caughlin, MD by [place] Peace, ED Scribe. The patient was seen in APA06/APA06. The patient's care was started at 11:35 PM.    Chief Complaint  Patient presents with  . Abdominal Pain      The history is provided by the patient. No language interpreter was used.   HPI Comments: Rachel George is a 34 y.o. female who presents to the Emergency Department complaining of recurrent abdominal pain x 1 week, right side worse than left, that has progressively worsened today with nausea, diarrhea (1 episode a day) last week, and feeling of urinary retention because she feels like she is only urinating small amounts. She adds her urine is "white" and "foamy". She complains of dysuria and frequency. Her pain is in the upper abdomen but worse on the right and goes into bilateral flank area. She states the pain was off and on but today became more constant. She describes it as pressure. She denies vomiting or fever. She states she's had similar symptoms with UTI in the past. . Pt reports blood sugars have been doing "all right", she reports levels are around 145 in the mornings and 160 - 180 at night.    [facility]  Past Medical History  Diagnosis Date  . Diabetes mellitus without complication   . Macular degeneration   . Fatty liver disease, nonalcoholic    History reviewed. No pertinent past surgical history. History reviewed. No pertinent family history. History  Substance Use Topics  . Smoking status: Current Every Day Smoker -- 0.50 packs/day    Types: Cigarettes  . Smokeless tobacco: Not on file  . Alcohol Use: No   employed  OB History    No data available     Review of Systems  Constitutional: Negative for fever.  Gastrointestinal: Positive for nausea, abdominal pain and diarrhea. Negative for vomiting.  Genitourinary: Positive for decreased urine volume.  All  other systems reviewed and are negative.     Allergies  Shellfish allergy  Home Medications   Prior to Admission medications   Medication Sig Start Date End Date Taking? Authorizing Provider  acetaminophen (TYLENOL) 500 MG tablet Take 1,000 mg by mouth every 6 (six) hours as needed for moderate pain.    Historical Provider, MD  cephALEXin (KEFLEX) 500 MG capsule Take 1 capsule (500 mg total) by mouth 3 (three) times daily. 04/17/15   Devoria AlbeIva Janisa Labus, MD  Cetirizine HCl (ZYRTEC ALLERGY) 10 MG CAPS One capsule daily as needed 03/18/15   Tammy Triplett, PA-C  diphenhydrAMINE (BENADRYL) 25 MG tablet Take 25 mg by mouth every 6 (six) hours as needed for allergies.    Historical Provider, MD  guaifenesin (ROBITUSSIN) 100 MG/5ML syrup Take 200 mg by mouth 3 (three) times daily as needed for cough.    Historical Provider, MD  HYDROcodone-acetaminophen (NORCO/VICODIN) 5-325 MG per tablet Take 1 tablet by mouth every 4 (four) hours as needed for moderate pain. Patient not taking: Reported on 01/29/2015 04/20/14   Ivery QualeHobson Bryant, PA-C  insulin aspart (NOVOLOG) 100 UNIT/ML injection Inject 15 Units into the skin 3 (three) times daily before meals.    Historical Provider, MD  insulin detemir (LEVEMIR) 100 UNIT/ML injection Inject 25 Units into the skin 2 (two) times daily.     Historical Provider, MD  naproxen (NAPROSYN) 500 MG tablet Take 1 tablet (500 mg total) by mouth 2 (  two) times daily with a meal. 03/18/15   Tammy Triplett, PA-C  oseltamivir (TAMIFLU) 75 MG capsule Take 1 capsule (75 mg total) by mouth every 12 (twelve) hours. Patient not taking: Reported on 03/18/2015 01/29/15   Glynn Octave, MD  phenazopyridine (PYRIDIUM) 200 MG tablet Take 1 tablet (200 mg total) by mouth 3 (three) times daily as needed (pain on urination). 04/17/15   Devoria Albe, MD  tetrahydrozoline 0.05 % ophthalmic solution Place 2 drops into both eyes 3 (three) times daily.    Historical Provider, MD   BP 156/90 mmHg  Pulse 84   Temp(Src) 97.9 F (36.6 C) (Oral)  Resp 20  Ht  (1.702 m)  Wt 300 lb (136.079 kg)  BMI 46.98 kg/m2  SpO2 100%  LMP 04/10/2015  Vital signs normal   Physical Exam  Constitutional: She is oriented to person, place, and time. She appears well-developed and well-nourished.  Non-toxic appearance. She does not appear ill. No distress.  HENT:  Head: Normocephalic and atraumatic.  Right Ear: External ear normal.  Left Ear: External ear normal.  Nose: Nose normal. No mucosal edema or rhinorrhea.  Mouth/Throat: Oropharynx is clear and moist and mucous membranes are normal. No dental abscesses or uvula swelling.  Tongue is dry.  Eyes: Conjunctivae and EOM are normal. Pupils are equal, round, and reactive to light.  Neck: Normal range of motion and full passive range of motion without pain. Neck supple.  Cardiovascular: Normal rate, regular rhythm and normal heart sounds.  Exam reveals no gallop and no friction rub.   No murmur heard. Pulmonary/Chest: Effort normal and breath sounds normal. No respiratory distress. She has no wheezes. She has no rhonchi. She has no rales. She exhibits no tenderness and no crepitus.  Abdominal: Soft. Normal appearance and bowel sounds are normal. She exhibits no distension. There is tenderness. There is no rebound and no guarding.    Tenderness to RUQ. Area of pain noted  Musculoskeletal: Normal range of motion. She exhibits no edema or tenderness.  Moves all extremities well.   Neurological: She is alert and oriented to person, place, and time. She has normal strength. No cranial nerve deficit.  Skin: Skin is warm, dry and intact. No rash noted. No erythema. No pallor.  Psychiatric: She has a normal mood and affect. Her speech is normal and behavior is normal. Her mood appears not anxious.  Nursing note and vitals reviewed.   ED Course  Procedures (including critical care time)  Medications  0.9 %  sodium chloride infusion (1,000 mLs Intravenous New  Bag/Given 04/17/15 0004)    Followed by  0.9 %  sodium chloride infusion (0 mLs Intravenous Stopped 04/17/15 0334)  sodium chloride 0.9 % injection (not administered)  fentaNYL (SUBLIMAZE) injection 50 mcg (50 mcg Intravenous Given 04/16/15 2355)  ondansetron (ZOFRAN) injection 4 mg (4 mg Intravenous Given 04/16/15 2354)  iohexol (OMNIPAQUE) 300 MG/ML solution 50 mL (50 mLs Oral Contrast Given 04/17/15 0020)  iohexol (OMNIPAQUE) 300 MG/ML solution 100 mL (100 mLs Intravenous Contrast Given 04/17/15 0123)  cefTRIAXone (ROCEPHIN) 1 g in dextrose 5 % 50 mL IVPB (1 g Intravenous New Bag/Given 04/17/15 0308)   11:44 PM- Treatment plan was discussed with patient who verbalizes understanding and agrees.  She was rechecked. She states her pain is improved. She was given IV antibiotics for presumed UTI.   Labs Review Results for orders placed or performed during the hospital encounter of 04/16/15  Urinalysis, Routine w reflex microscopic  Result Value  Ref Range   Color, Urine YELLOW YELLOW   APPearance HAZY (A) CLEAR   Specific Gravity, Urine >1.030 (H) 1.005 - 1.030   pH 6.0 5.0 - 8.0   Glucose, UA 500 (A) NEGATIVE mg/dL   Hgb urine dipstick MODERATE (A) NEGATIVE   Bilirubin Urine NEGATIVE NEGATIVE   Ketones, ur NEGATIVE NEGATIVE mg/dL   Protein, ur TRACE (A) NEGATIVE mg/dL   Urobilinogen, UA 0.2 0.0 - 1.0 mg/dL   Nitrite POSITIVE (A) NEGATIVE   Leukocytes, UA NEGATIVE NEGATIVE  CBC with Differential  Result Value Ref Range   WBC 7.9 4.0 - 10.5 K/uL   RBC 4.85 3.87 - 5.11 MIL/uL   Hemoglobin 13.0 12.0 - 15.0 g/dL   HCT 16.139.7 09.636.0 - 04.546.0 %   MCV 81.9 78.0 - 100.0 fL   MCH 26.8 26.0 - 34.0 pg   MCHC 32.7 30.0 - 36.0 g/dL   RDW 40.914.6 81.111.5 - 91.415.5 %   Platelets 201 150 - 400 K/uL   Neutrophils Relative % 29 (L) 43 - 77 %   Neutro Abs 2.3 1.7 - 7.7 K/uL   Lymphocytes Relative 63 (H) 12 - 46 %   Lymphs Abs 5.0 (H) 0.7 - 4.0 K/uL   Monocytes Relative 6 3 - 12 %   Monocytes Absolute 0.4 0.1 -  1.0 K/uL   Eosinophils Relative 2 0 - 5 %   Eosinophils Absolute 0.1 0.0 - 0.7 K/uL   Basophils Relative 0 0 - 1 %   Basophils Absolute 0.0 0.0 - 0.1 K/uL  Comprehensive metabolic panel  Result Value Ref Range   Sodium 137 135 - 145 mmol/L   Potassium 3.7 3.5 - 5.1 mmol/L   Chloride 108 101 - 111 mmol/L   CO2 22 22 - 32 mmol/L   Glucose, Bld 299 (H) 65 - 99 mg/dL   BUN 12 6 - 20 mg/dL   Creatinine, Ser 7.820.67 0.44 - 1.00 mg/dL   Calcium 8.4 (L) 8.9 - 10.3 mg/dL   Total Protein 6.1 (L) 6.5 - 8.1 g/dL   Albumin 3.1 (L) 3.5 - 5.0 g/dL   AST 14 (L) 15 - 41 U/L   ALT 11 (L) 14 - 54 U/L   Alkaline Phosphatase 86 38 - 126 U/L   Total Bilirubin 0.2 (L) 0.3 - 1.2 mg/dL   GFR calc non Af Amer >60 >60 mL/min   GFR calc Af Amer >60 >60 mL/min   Anion gap 7 5 - 15  Pregnancy, urine  Result Value Ref Range   Preg Test, Ur NEGATIVE NEGATIVE  Urine microscopic-add on  Result Value Ref Range   Squamous Epithelial / LPF FEW (A) RARE   WBC, UA 11-20 <3 WBC/hpf   RBC / HPF 3-6 <3 RBC/hpf   Bacteria, UA MANY (A) RARE  Lipase, blood  Result Value Ref Range   Lipase 28 22 - 51 U/L   Laboratory interpretation all normal except uti     Imaging Review Ct Abdomen Pelvis W Contrast  04/17/2015   CLINICAL DATA:  Acute onset of generalized abdominal pain, right greater than left, with nausea, diarrhea and urinary retention. Initial encounter.  EXAM: CT ABDOMEN AND PELVIS WITH CONTRAST  TECHNIQUE: Multidetector CT imaging of the abdomen and pelvis was performed using the standard protocol following bolus administration of intravenous contrast.  CONTRAST:  100mL OMNIPAQUE IOHEXOL 300 MG/ML  SOLN  COMPARISON:  CT of the abdomen and pelvis performed 08/31/2008  FINDINGS: The visualized lung bases are clear.  Chronic diffuse skin thickening is noted along the mid back, with underlying stable soft tissue scarring.  The liver and spleen are unremarkable in appearance. The gallbladder is within normal limits.  The pancreas and adrenal glands are unremarkable.  The kidneys are unremarkable in appearance. There is no evidence of hydronephrosis. No renal or ureteral stones are seen. No perinephric stranding is appreciated.  No free fluid is identified. The small bowel is unremarkable in appearance. The stomach is within normal limits. No acute vascular abnormalities are seen.  The appendix is normal in caliber, without evidence of appendicitis. The colon is largely decompressed and unremarkable in appearance.  The bladder is mildly distended. A small amount of air is seen within the bladder. 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 The uterus is unremarkable in appearance. The ovaries are within normal limits. No suspicious adnexal masses are seen. No inguinal lymphadenopathy is seen.  No acute osseous abnormalities are identified.  IMPRESSION: 1. Small amount of air noted within the bladder. 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 2. Stable appearance to chronic diffuse skin thickening along the mid back, with underlying chronic soft tissue scarring.   Electronically Signed   By: Roanna Raider M.D.   On: 04/17/2015 02:24     EKG Interpretation None          MDM   Final diagnoses:  Urinary tract infection without hematuria, site unspecified    New Prescriptions   CEPHALEXIN (KEFLEX) 500 MG CAPSULE    Take 1 capsule (500 mg total) by mouth 3 (three) times daily.   PHENAZOPYRIDINE (PYRIDIUM) 200 MG TABLET    Take 1 tablet (200 mg total) by mouth 3 (three) times daily as needed (pain on urination).   Plan discharge  Devoria Albe, MD, FACEP   I personally performed the services described in this documentation, which was scribed in my presence. The recorded information has been reviewed and considered.  Devoria Albe, MD, Concha Pyo, MD 04/17/15 [phone]

## 2015-04-16 NOTE — ED Notes (Addendum)
Patient complaining of abdominal pain to both upper quadrants for over a week, but has worsened today. She also reports urinary retention and nausea. States urine is white and "foamy".

## 2015-04-17 LAB — LIPASE, BLOOD: LIPASE: 28 U/L (ref 22–51)

## 2015-04-17 MED ORDER — IOHEXOL 300 MG/ML  SOLN
100.0000 mL | Freq: Once | INTRAMUSCULAR | Status: AC | PRN
  Administered 2015-04-17: 100 mL via INTRAVENOUS

## 2015-04-17 MED ORDER — CEPHALEXIN 500 MG PO CAPS: 500.0000 mg | ORAL_CAPSULE | Freq: Three times a day (TID) | ORAL | Status: DC

## 2015-04-17 MED ORDER — DEXTROSE 5 % IV SOLN
1.0000 g | Freq: Once | INTRAVENOUS | Status: AC
  Administered 2015-04-17: 1 g via INTRAVENOUS
  Filled 2015-04-17: qty 10

## 2015-04-17 MED ORDER — PHENAZOPYRIDINE HCL 200 MG PO TABS: 200.0000 mg | ORAL_TABLET | Freq: Three times a day (TID) | ORAL | Status: DC | PRN

## 2015-04-17 MED ORDER — IOHEXOL 300 MG/ML  SOLN
50.0000 mL | Freq: Once | INTRAMUSCULAR | Status: AC | PRN
  Administered 2015-04-17: 50 mL via ORAL

## 2015-04-17 NOTE — Discharge Instructions (Signed)
Drink plenty of fluids. Take the antibiotics until gone. Use the pyridium for pain on urination.  Recheck if you get a fever, or have uncontrolled vomiting or pain.  Urinary Tract Infection A urinary tract infection (UTI) can occur any place along the urinary tract. The tract includes the kidneys, ureters, bladder, and urethra. A type of germ called bacteria often causes a UTI. UTIs are often helped with antibiotic medicine.  HOME CARE   If given, take antibiotics as told by your doctor. Finish them even if you start to feel better.  Drink enough fluids to keep your pee (urine) clear or pale yellow.  Avoid tea, drinks with caffeine, and bubbly (carbonated) drinks.  Pee often. Avoid holding your pee in for a long time.  Pee before and after having sex (intercourse).  Wipe from front to back after you poop (bowel movement) if you are a woman. Use each tissue only once. GET HELP RIGHT AWAY IF:   You have back pain.  You have lower belly (abdominal) pain.  You have chills.  You feel sick to your stomach (nauseous).  You throw up (vomit).  Your burning or discomfort with peeing does not go away.  You have a fever.  Your symptoms are not better in 3 days. MAKE SURE YOU:   Understand these instructions.  Will watch your condition.  Will get help right away if you are not doing well or get worse. Document Released: 05/06/2008 Document Revised: 08/12/2012 Document Reviewed: 06/18/2012 [facility] Patient Information 2015 [place] [place]. This information is not intended to replace advice given to you by your health care provider. Make sure you discuss any questions you have with your health care provider.

## 2015-04-17 NOTE — ED Notes (Signed)
Discharge instructions given, pt demonstrated teach back and verbal understanding. No concerns voiced.  

## 2015-04-19 LAB — URINE CULTURE: Colony Count: >=100000

## 2015-04-20 ENCOUNTER — Telehealth (HOSPITAL_BASED_OUTPATIENT_CLINIC_OR_DEPARTMENT_OTHER): Payer: Self-pay | Admitting: Emergency Medicine

## 2015-04-20 NOTE — Telephone Encounter (Signed)
Post ED Visit - Positive Culture Follow-up  Culture report reviewed by antimicrobial stewardship pharmacist: []  Wes Dulaney, Pharm.D., BCPS [x]  Celedonio MiyamotoJeremy Frens, [address] BCPS []  Georgina PillionElizabeth Martin, Pharm.D., BCPS []  [place] Pham, [place]D., BCPS, AAHIVP []  Estella HuskMichelle Turner, Pharm.D., BCPS, AAHIVP []  Elder CyphersLorie Poole, [address] BCPS  Positive urine culture Klebsiella Treated with cephalexin, organism sensitive to the same and no further patient follow-up is required at this time.  Rachel George, Rachel George 04/20/2015, 11:34 AM

## 2015-08-25 ENCOUNTER — Emergency Department (HOSPITAL_COMMUNITY): Payer: Self-pay

## 2015-08-25 ENCOUNTER — Encounter (HOSPITAL_COMMUNITY): Payer: Self-pay | Admitting: *Deleted

## 2015-08-25 ENCOUNTER — Emergency Department (HOSPITAL_COMMUNITY)
Admission: EM | Admit: 2015-08-25 | Discharge: 2015-08-25 | Disposition: A | Discharge status: Home / Self Care | Payer: Self-pay | Attending: Emergency Medicine | Admitting: Emergency Medicine

## 2015-08-25 DIAGNOSIS — E669 Obesity, unspecified: Secondary | ICD-10-CM | POA: Insufficient documentation

## 2015-08-25 DIAGNOSIS — R110 Nausea: Secondary | ICD-10-CM | POA: Insufficient documentation

## 2015-08-25 DIAGNOSIS — Z3202 Encounter for pregnancy test, result negative: Secondary | ICD-10-CM | POA: Insufficient documentation

## 2015-08-25 DIAGNOSIS — K5900 Constipation, unspecified: Secondary | ICD-10-CM | POA: Insufficient documentation

## 2015-08-25 DIAGNOSIS — R1011 Right upper quadrant pain: Secondary | ICD-10-CM | POA: Insufficient documentation

## 2015-08-25 DIAGNOSIS — R109 Unspecified abdominal pain: Secondary | ICD-10-CM

## 2015-08-25 DIAGNOSIS — E119 Type 2 diabetes mellitus without complications: Secondary | ICD-10-CM | POA: Insufficient documentation

## 2015-08-25 DIAGNOSIS — Z794 Long term (current) use of insulin: Secondary | ICD-10-CM | POA: Insufficient documentation

## 2015-08-25 DIAGNOSIS — Z8669 Personal history of other diseases of the nervous system and sense organs: Secondary | ICD-10-CM | POA: Insufficient documentation

## 2015-08-25 DIAGNOSIS — Z792 Long term (current) use of antibiotics: Secondary | ICD-10-CM | POA: Insufficient documentation

## 2015-08-25 DIAGNOSIS — Z79899 Other long term (current) drug therapy: Secondary | ICD-10-CM | POA: Insufficient documentation

## 2015-08-25 DIAGNOSIS — Z720 Tobacco use: Secondary | ICD-10-CM | POA: Insufficient documentation

## 2015-08-25 LAB — COMPREHENSIVE METABOLIC PANEL
ALT: 14 U/L (ref 14–54)
ANION GAP: 7 (ref 5–15)
AST: 17 U/L (ref 15–41)
Albumin: 3.5 g/dL (ref 3.5–5.0)
Alkaline Phosphatase: 84 U/L (ref 38–126)
BUN: 9 mg/dL (ref 6–20)
CO2: 24 mmol/L (ref 22–32)
Calcium: 8.3 mg/dL — ABNORMAL LOW (ref 8.9–10.3)
Chloride: 103 mmol/L (ref 101–111)
Creatinine, Ser: 0.77 mg/dL (ref 0.44–1.00)
GFR calc Af Amer: >60 mL/min (ref >60)
GFR calc non Af Amer: >60 mL/min (ref >60)
Glucose, Bld: 376 mg/dL — ABNORMAL HIGH (ref 65–99)
POTASSIUM: 4.4 mmol/L (ref 3.5–5.1)
SODIUM: 134 mmol/L — AB (ref 135–145)
Total Bilirubin: 0.4 mg/dL (ref 0.3–1.2)
Total Protein: 7 g/dL (ref 6.5–8.1)

## 2015-08-25 LAB — PREGNANCY, URINE: PREG TEST UR: NEGATIVE

## 2015-08-25 LAB — CBC WITH DIFFERENTIAL/PLATELET
Basophils Absolute: 0 10*3/uL (ref 0.0–0.1)
Basophils Relative: 0 %
EOS ABS: 0.1 10*3/uL (ref 0.0–0.7)
EOS PCT: 1 %
HCT: 43.5 % (ref 36.0–46.0)
Hemoglobin: 14.4 g/dL (ref 12.0–15.0)
LYMPHS ABS: 3.9 10*3/uL (ref 0.7–4.0)
Lymphocytes Relative: 56 %
MCH: 27.2 pg (ref 26.0–34.0)
MCHC: 33.1 g/dL (ref 30.0–36.0)
MCV: 82.2 fL (ref 78.0–100.0)
MONO ABS: 0.6 10*3/uL (ref 0.1–1.0)
Monocytes Relative: 8 %
Neutro Abs: 2.5 10*3/uL (ref 1.7–7.7)
Neutrophils Relative %: 35 %
PLATELETS: 186 10*3/uL (ref 150–400)
RBC: 5.29 MIL/uL — AB (ref 3.87–5.11)
RDW: 15.1 % (ref 11.5–15.5)
WBC: 7.1 10*3/uL (ref 4.0–10.5)

## 2015-08-25 LAB — URINE MICROSCOPIC-ADD ON

## 2015-08-25 LAB — URINALYSIS, ROUTINE W REFLEX MICROSCOPIC
Bilirubin Urine: NEGATIVE
Glucose, UA: >1000 mg/dL — AB
Ketones, ur: NEGATIVE mg/dL
Leukocytes, UA: NEGATIVE
Nitrite: POSITIVE — AB
Protein, ur: NEGATIVE mg/dL
Specific Gravity, Urine: 1.015 (ref 1.005–1.030)
UROBILINOGEN UA: 0.2 mg/dL (ref 0.0–1.0)
pH: 5 (ref 5.0–8.0)

## 2015-08-25 LAB — LIPASE, BLOOD: LIPASE: 21 U/L — AB (ref 22–51)

## 2015-08-25 MED ORDER — ONDANSETRON HCL 4 MG/2ML IJ SOLN
4.0000 mg | Freq: Once | INTRAMUSCULAR | Status: AC
  Administered 2015-08-25: 4 mg via INTRAVENOUS
  Filled 2015-08-25: qty 2

## 2015-08-25 MED ORDER — PANTOPRAZOLE SODIUM 40 MG PO TBEC
40.0000 mg | DELAYED_RELEASE_TABLET | Freq: Once | ORAL | Status: AC
  Administered 2015-08-25: 40 mg via ORAL
  Filled 2015-08-25: qty 1

## 2015-08-25 MED ORDER — RANITIDINE HCL 150 MG PO CAPS: 150.0000 mg | ORAL_CAPSULE | Freq: Two times a day (BID) | ORAL | Status: DC

## 2015-08-25 MED ORDER — SODIUM CHLORIDE 0.9 % IV BOLUS (SEPSIS)
1000.0000 mL | Freq: Once | INTRAVENOUS | Status: AC
  Administered 2015-08-25: 1000 mL via INTRAVENOUS

## 2015-08-25 NOTE — ED Notes (Signed)
MD at bedside. 

## 2015-08-25 NOTE — ED Provider Notes (Addendum)
CSN: [id number]     Arrival date & time 08/25/15  0809 History  This chart was scribed for Rachel Berkshire, MD by Marica Otter, ED Scribe. This patient was seen in room APA11/APA11 and the patient's care was started at 8:25 AM.   Chief Complaint  Patient presents with  . Abdominal Pain   Patient is a 34 y.o. female presenting with abdominal pain. The history is provided by the patient. No language interpreter was used.  Abdominal Pain Pain location:  RUQ Pain radiates to:  Does not radiate Pain severity:  Severe Onset quality:  Gradual Duration:  1 day Chronicity:  Recurrent Context: not previous surgeries   Associated symptoms: constipation and nausea   Associated symptoms: no chest pain, no cough, no diarrhea, no fatigue, no hematuria and no vomiting   Risk factors: obesity   Risk factors: not elderly    PCP: No PCP Per Patient HPI Comments: Rachel George is a 34 y.o. female, with PMHx noted below including Hx of constipation with associated abd pain and nausea, who presents to the Emergency Department complaining of constipation onset 5 days ago with associated abd pain and nausea onset yesterday. Pt reports her last known baseline bowel movements was 6 days ago. Pt reports using magnesium citrate at home with minimal relief noting that she had a very small bowel movement yesterday. Pt denies any Hx of abd surgeries.   Past Medical History  Diagnosis Date  . Diabetes mellitus without complication   . Macular degeneration   . Fatty liver disease, nonalcoholic    History reviewed. No pertinent past surgical history. No family history on file. Social History  Substance Use Topics  . Smoking status: Current Every Day Smoker -- 0.50 packs/day    Types: Cigarettes  . Smokeless tobacco: None  . Alcohol Use: No   OB History    No data available     Review of Systems  Constitutional: Negative for appetite change and fatigue.  HENT: Negative for congestion, ear discharge and  sinus pressure.   Eyes: Negative for discharge.  Respiratory: Negative for cough.   Cardiovascular: Negative for chest pain.  Gastrointestinal: Positive for nausea, abdominal pain and constipation. Negative for vomiting and diarrhea.  Genitourinary: Negative for frequency and hematuria.  Musculoskeletal: Negative for back pain.  Skin: Negative for rash.  Neurological: Negative for seizures and headaches.  Psychiatric/Behavioral: Negative for hallucinations.   Allergies  Shellfish allergy  Home Medications   Prior to Admission medications   Medication Sig Start Date End Date Taking? Authorizing Provider  acetaminophen (TYLENOL) 500 MG tablet Take 1,000 mg by mouth every 6 (six) hours as needed for moderate pain.    Historical Provider, MD  cephALEXin (KEFLEX) 500 MG capsule Take 1 capsule (500 mg total) by mouth 3 (three) times daily. 04/17/15   Devoria Albe, MD  Cetirizine HCl (ZYRTEC ALLERGY) 10 MG CAPS One capsule daily as needed 03/18/15   Tammy Triplett, PA-C  diphenhydrAMINE (BENADRYL) 25 MG tablet Take 25 mg by mouth every 6 (six) hours as needed for allergies.    Historical Provider, MD  guaifenesin (ROBITUSSIN) 100 MG/5ML syrup Take 200 mg by mouth 3 (three) times daily as needed for cough.    Historical Provider, MD  HYDROcodone-acetaminophen (NORCO/VICODIN) 5-325 MG per tablet Take 1 tablet by mouth every 4 (four) hours as needed for moderate pain. Patient not taking: Reported on 01/29/2015 04/20/14   Ivery Quale, PA-C  insulin aspart (NOVOLOG) 100 UNIT/ML injection Inject 15 Units into  the skin 3 (three) times daily before meals.    Historical Provider, MD  insulin detemir (LEVEMIR) 100 UNIT/ML injection Inject 25 Units into the skin 2 (two) times daily.     Historical Provider, MD  naproxen (NAPROSYN) 500 MG tablet Take 1 tablet (500 mg total) by mouth 2 (two) times daily with a meal. 03/18/15   Tammy Triplett, PA-C  oseltamivir (TAMIFLU) 75 MG capsule Take 1 capsule (75 mg total)  by mouth every 12 (twelve) hours. Patient not taking: Reported on 03/18/2015 01/29/15   Glynn Octave, MD  phenazopyridine (PYRIDIUM) 200 MG tablet Take 1 tablet (200 mg total) by mouth 3 (three) times daily as needed (pain on urination). 04/17/15   Devoria Albe, MD  tetrahydrozoline 0.05 % ophthalmic solution Place 2 drops into both eyes 3 (three) times daily.    Historical Provider, MD   Triage Vitals: BP 143/92 mmHg  Pulse 94  Temp(Src) 97.9 F (36.6 C) (Oral)  Resp 18  Ht  (1.702 m)  Wt 300 lb (136.079 kg)  BMI 46.98 kg/m2  SpO2 95%  LMP 08/10/2015 Physical Exam  Constitutional: She is oriented to person, place, and time. She appears well-developed.  HENT:  Head: Normocephalic.  Eyes: Conjunctivae and EOM are normal. No scleral icterus.  Neck: Neck supple. No thyromegaly present.  Cardiovascular: Normal rate and regular rhythm.  Exam reveals no gallop and no friction rub.   No murmur heard. Pulmonary/Chest: No stridor. She has no wheezes. She has no rales. She exhibits no tenderness.  Abdominal: She exhibits no distension. There is tenderness in the right upper quadrant. There is no rebound.  Musculoskeletal: Normal range of motion. She exhibits no edema.  Lymphadenopathy:    She has no cervical adenopathy.  Neurological: She is oriented to person, place, and time. She exhibits normal muscle tone. Coordination normal.  Skin: No rash noted. No erythema.  Psychiatric: She has a normal mood and affect. Her behavior is normal.    ED Course  Procedures (including critical care time) DIAGNOSTIC STUDIES: Oxygen Saturation is 95% on RA, adequate by my interpretation.    COORDINATION OF CARE: 8:27 AM: Discussed treatment plan which includes labs with pt at bedside; patient verbalizes understanding and agrees with treatment plan.  Labs Review Labs Reviewed - No data to display  Imaging Review No results found. I have personally reviewed and evaluated these images and lab  results as part of my medical decision-making.  MDM   Final diagnoses:  None   abdominal pain with unremarkable length films and labs. We'll treat for GERD. Patient will follow up with primary care doctor if symptoms continue. She may need further workup to rule out gallstones.   Rachel Berkshire, MD The chart was scribed for me under my direct supervision.  I personally performed the history, physical, and medical decision making and all procedures in the evaluation of this patient.Marland Kitchen  08/25/15 1131  Rachel Berkshire, MD 08/25/15 1131

## 2015-08-25 NOTE — ED Notes (Signed)
Pt reports constipation and RUQ and LUQ abdominal pain. Tender to palpation in RUQ and LUQ, bowel sounds present in all 4 quadrants, pt able to pass gass. Pt reports her last normal BM was 08/19/15 but did have a very small one yesterday 08/24/15. Pt normally has 1-2 BMs daily. Pt reports she has taken Mag Citrate yesterday at 1700 and had very small BM after straining. Pt reports nausea, but denies vomiting. Pt reports she has hx of constipation problems.

## 2015-08-25 NOTE — ED Notes (Signed)
Pt states constipation, stating LNBM was Saturday. Mag Citrate attempted at home with no relief. Nausea with some vomiting, per pt.

## 2015-08-25 NOTE — Discharge Instructions (Signed)
Follow up with your md next week for recheck °

## 2015-08-27 LAB — URINE CULTURE: Special Requests: NORMAL

## 2015-08-28 ENCOUNTER — Telehealth (HOSPITAL_COMMUNITY): Payer: Self-pay

## 2015-08-28 NOTE — Telephone Encounter (Signed)
Post ED Visit - Positive Culture Follow-up: Successful Patient Follow-Up  Culture assessed and recommendations reviewed by:  Celedonio Miyamoto, Pharm.D., BCPS-AQ ID  Georgina Pillion, [address].D., BCPS  [place], [address].D., BCPS, AAHIVP  Estella Husk, Pharm.D., BCPS, AAHIVP  Cristy Reyes, [address].D.  Tennis Must, [place].D.  Positive urnie culture   Patient discharged without antimicrobial prescription and treatment is now indicated  Organism is resistant to prescribed ED discharge antimicrobial  Patient with positive blood cultures  Changes discussed with ED provider: Trixie Dredge PA  New antibiotic prescription symptom check. If having dysuria, frequency please give bactrim DS 1 tab bid x 3 days  Attempting to contact pt  Ashley Jacobs 08/28/2015, 11:54 AM

## 2015-08-28 NOTE — Progress Notes (Signed)
ED Antimicrobial Stewardship Positive Culture Follow Up   Rachel George is an 34 y.o. female who presented to [facility] on 08/25/2015 with a chief complaint of  Chief Complaint  Patient presents with  . Abdominal Pain    Recent Results (from the past 720 hour(s))  Urine culture     Status: None   Collection Time: 08/25/15 10:41 AM  Result Value Ref Range Status   Specimen Description URINE, CLEAN CATCH  Final   Special Requests Normal  Final   Culture   Final    >=100,000 COLONIES/mL KLEBSIELLA PNEUMONIAE Performed at [facility]    Report Status 08/27/2015 FINAL  Final   Organism ID, Bacteria KLEBSIELLA PNEUMONIAE  Final      Susceptibility   Klebsiella pneumoniae - MIC*    AMPICILLIN >=32 RESISTANT Resistant     CEFAZOLIN <=4 SENSITIVE Sensitive     CEFTRIAXONE <=1 SENSITIVE Sensitive     CIPROFLOXACIN <=0.25 SENSITIVE Sensitive     GENTAMICIN <=1 SENSITIVE Sensitive     IMIPENEM <=0.25 SENSITIVE Sensitive     NITROFURANTOIN 64 INTERMEDIATE Intermediate     TRIMETH/SULFA <=20 SENSITIVE Sensitive     AMPICILLIN/SULBACTAM 4 SENSITIVE Sensitive     PIP/TAZO <=4 SENSITIVE Sensitive     * >=100,000 COLONIES/mL KLEBSIELLA PNEUMONIAE   Check for symptoms of dysuria, urinary frequency or urgency. If pt reports symptoms, treat with Bactrim DS 1 tab PO BID x 3 days.   ED Shawn Dannenberg: Trixie Dredge, PA-C   Greggory Stallion, PharmD Clinical Pharmacy Resident Pager # [phone] 08/28/2015 9:11 AM   Infectious Diseases Pharmacist Phone# [phone]

## 2015-08-30 ENCOUNTER — Telehealth (HOSPITAL_BASED_OUTPATIENT_CLINIC_OR_DEPARTMENT_OTHER): Payer: Self-pay | Admitting: Emergency Medicine

## 2015-08-30 NOTE — Telephone Encounter (Signed)
Post ED Visit - Positive Culture Follow-up: Successful Patient Follow-Up  Culture assessed and recommendations reviewed by:  Celedonio Miyamoto, Pharm.D., BCPS-AQ ID  Georgina Pillion, Pharm.D., BCPS  [place], [address].D., BCPS, AAHIVP  Estella Husk, Pharm.D., BCPS, AAHIVP  [employer], [address].D.  Cassie Roseanne [place], [place].D.  Positive urine culture Klebsiella   Patient discharged without antimicrobial prescription and treatment is now indicated  Organism is resistant to prescribed ED discharge antimicrobial  Patient with positive blood cultures  Changes discussed with ED provider: Trixie Dredge PA, symptom check, pt with dysuria and frequency New antibiotic prescription Bactrim DS one tab po bid x 3 days Called to [facility]   Contacted patient, 08/30/15 1140   Rachel George 08/30/2015, 11:39 AM

## 2015-08-31 ENCOUNTER — Telehealth (HOSPITAL_BASED_OUTPATIENT_CLINIC_OR_DEPARTMENT_OTHER): Payer: Self-pay | Admitting: Emergency Medicine

## 2015-09-08 ENCOUNTER — Emergency Department (HOSPITAL_COMMUNITY)
Admission: EM | Admit: 2015-09-08 | Discharge: 2015-09-08 | Disposition: A | Discharge status: Home / Self Care | Payer: Self-pay | Attending: Emergency Medicine | Admitting: Emergency Medicine

## 2015-09-08 ENCOUNTER — Emergency Department (HOSPITAL_COMMUNITY): Payer: Self-pay

## 2015-09-08 ENCOUNTER — Encounter (HOSPITAL_COMMUNITY): Payer: Self-pay | Admitting: Emergency Medicine

## 2015-09-08 DIAGNOSIS — Z8669 Personal history of other diseases of the nervous system and sense organs: Secondary | ICD-10-CM | POA: Insufficient documentation

## 2015-09-08 DIAGNOSIS — Z794 Long term (current) use of insulin: Secondary | ICD-10-CM | POA: Insufficient documentation

## 2015-09-08 DIAGNOSIS — Z3202 Encounter for pregnancy test, result negative: Secondary | ICD-10-CM | POA: Insufficient documentation

## 2015-09-08 DIAGNOSIS — Z8719 Personal history of other diseases of the digestive system: Secondary | ICD-10-CM | POA: Insufficient documentation

## 2015-09-08 DIAGNOSIS — E1165 Type 2 diabetes mellitus with hyperglycemia: Secondary | ICD-10-CM | POA: Insufficient documentation

## 2015-09-08 DIAGNOSIS — R05 Cough: Secondary | ICD-10-CM | POA: Insufficient documentation

## 2015-09-08 DIAGNOSIS — Z720 Tobacco use: Secondary | ICD-10-CM | POA: Insufficient documentation

## 2015-09-08 DIAGNOSIS — R739 Hyperglycemia, unspecified: Secondary | ICD-10-CM

## 2015-09-08 LAB — BASIC METABOLIC PANEL
ANION GAP: 6 (ref 5–15)
BUN: 13 mg/dL (ref 6–20)
CALCIUM: 8.4 mg/dL — AB (ref 8.9–10.3)
CO2: 23 mmol/L (ref 22–32)
Chloride: 105 mmol/L (ref 101–111)
Creatinine, Ser: 0.84 mg/dL (ref 0.44–1.00)
GFR calc non Af Amer: >60 mL/min (ref >60)
Glucose, Bld: 300 mg/dL — ABNORMAL HIGH (ref 65–99)
POTASSIUM: 4.2 mmol/L (ref 3.5–5.1)
SODIUM: 134 mmol/L — AB (ref 135–145)

## 2015-09-08 LAB — CBG MONITORING, ED
Glucose-Capillary: 314 mg/dL — ABNORMAL HIGH (ref 65–99)
Glucose-Capillary: 237 mg/dL — ABNORMAL HIGH (ref 65–99)
Glucose-Capillary: 221 mg/dL — ABNORMAL HIGH (ref 65–99)

## 2015-09-08 LAB — CBC WITH DIFFERENTIAL/PLATELET
BASOS ABS: 0 10*3/uL (ref 0.0–0.1)
BASOS PCT: 1 %
EOS PCT: 1 %
Eosinophils Absolute: 0.1 10*3/uL (ref 0.0–0.7)
HCT: 41.2 % (ref 36.0–46.0)
Hemoglobin: 13.9 g/dL (ref 12.0–15.0)
LYMPHS PCT: 55 %
Lymphs Abs: 4.4 10*3/uL — ABNORMAL HIGH (ref 0.7–4.0)
MCH: 27.6 pg (ref 26.0–34.0)
MCHC: 33.7 g/dL (ref 30.0–36.0)
MCV: 81.9 fL (ref 78.0–100.0)
Monocytes Absolute: 0.6 10*3/uL (ref 0.1–1.0)
Monocytes Relative: 7 %
Neutro Abs: 2.9 10*3/uL (ref 1.7–7.7)
Neutrophils Relative %: 36 %
PLATELETS: 202 10*3/uL (ref 150–400)
RBC: 5.03 MIL/uL (ref 3.87–5.11)
RDW: 14.9 % (ref 11.5–15.5)
WBC: 8 10*3/uL (ref 4.0–10.5)

## 2015-09-08 LAB — URINALYSIS, ROUTINE W REFLEX MICROSCOPIC
BILIRUBIN URINE: NEGATIVE
Glucose, UA: >1000 mg/dL — AB
Ketones, ur: NEGATIVE mg/dL
Leukocytes, UA: NEGATIVE
NITRITE: NEGATIVE
PROTEIN: 30 mg/dL — AB
SPECIFIC GRAVITY, URINE: 1.015 (ref 1.005–1.030)
UROBILINOGEN UA: 0.2 mg/dL (ref 0.0–1.0)
pH: 6 (ref 5.0–8.0)

## 2015-09-08 LAB — URINE MICROSCOPIC-ADD ON

## 2015-09-08 LAB — PREGNANCY, URINE: PREG TEST UR: NEGATIVE

## 2015-09-08 MED ORDER — ONDANSETRON HCL 4 MG/2ML IJ SOLN
4.0000 mg | Freq: Once | INTRAMUSCULAR | Status: DC
  Filled 2015-09-08: qty 2

## 2015-09-08 MED ORDER — ONDANSETRON HCL 4 MG/2ML IJ SOLN
4.0000 mg | Freq: Once | INTRAMUSCULAR | Status: AC
  Administered 2015-09-08: 4 mg via INTRAVENOUS

## 2015-09-08 MED ORDER — SODIUM CHLORIDE 0.9 % IV BOLUS (SEPSIS)
1000.0000 mL | Freq: Once | INTRAVENOUS | Status: AC
  Administered 2015-09-08: 1000 mL via INTRAVENOUS

## 2015-09-08 MED ORDER — METFORMIN HCL 500 MG PO TABS: 500.0000 mg | ORAL_TABLET | Freq: Two times a day (BID) | ORAL | Status: DC

## 2015-09-08 MED ORDER — SODIUM CHLORIDE 0.9 % IV SOLN: INTRAVENOUS | Status: DC

## 2015-09-08 NOTE — ED Notes (Signed)
Was called into room by pt -- Pt vomiting.has also been noted to be coughing quite a bit since arrival into room - MD informed and orders obtained ( Accucheck was performed as pt took mothers diabetic med earlier ) .

## 2015-09-08 NOTE — ED Notes (Signed)
Glucose at home 467.  History of DM.  Having frequent urination, blurred vision, fatigue, etc.  Pt says she has taken any of her DM medication (levemir and Novolog since 2014.

## 2015-09-08 NOTE — Discharge Instructions (Signed)
Start the metformin as directed. Make appointment to follow-up either with health Department or the free clinic. Resource guide provided below to help you find a regular doctor.   Emergency Department Resource Guide 1) Find a Doctor and Pay Out of Pocket Although you won't have to find out who is covered by your insurance plan, it is a good idea to ask around and get recommendations. You will then need to call the office and see if the doctor you have chosen will accept you as a new patient and what types of options they offer for patients who are self-pay. Some doctors offer discounts or will set up payment plans for their patients who do not have insurance, but you will need to ask so you aren't surprised when you get to your appointment.  2) Contact Your Local Health Department Not all health departments have doctors that can see patients for sick visits, but many do, so it is worth a call to see if yours does. If you don't know where your local health department is, you can check in your phone book. The CDC also has a tool to help you locate your state's health department, and many state websites also have listings of all of their local health departments.  3) Find a Walk-in Clinic If your illness is not likely to be very severe or complicated, you may want to try a walk in clinic. These are popping up all over the country in pharmacies, drugstores, and shopping centers. They're usually staffed by nurse practitioners or physician assistants that have been trained to treat common illnesses and complaints. They're usually fairly quick and inexpensive. However, if you have serious medical issues or chronic medical problems, these are probably not your best option.  No Primary Care Doctor: - Call Health Connect at  [phone] - they can help you locate a primary care doctor that  accepts your insurance, provides certain services, etc. - Physician Referral Service- [phone]  Chronic Pain  Problems: Organization         Address  Phone   Notes  [facility]  [phone] Patients need to be referred by their primary care doctor.   Medication Assistance: Organization         Address  Phone   Notes  [facility] Medication [facility] [address] [place] [place], [place] [address] [phone] --Must be a resident of [facility] -- Must have NO insurance coverage whatsoever (no Medicaid/ Medicare, etc.) -- The pt. MUST have a primary care doctor that directs their care regularly and follows them in the community   [facility]ist  [phone]   [employer]  [phone]    Agencies that provide inexpensive medical care: Organization         Address  Phone   Notes  [facility]  [phone]   [facility]    [phone]   [facility] [address] [place], [place] [address] [phone]   [facility] [address] [place] [place] [phone]   [facility]anned Parenthood    [phone]   [facility]    [phone]   [facility]  [address], [place] Phone:  [phone], Fax:  [phone] Hours of Operation:  9 am - 6 pm, M-F.  Also accepts Medicaid/Medicare and self-pay.  [facility]  [address] [place],  [address], [place] Phone: [phone], Fax: [phone]. Hours of Operation:  8:30 am - 5:30 pm, M-F.  Also accepts Medicaid and self-pay.  [facility] [place] [address] [place] Phone: [phone]   [place], [place], [place] [phone], Ext. [phone] Mondays & Thursdays: 7-9 AM.  First 15 patients are seen on a first come, first serve basis.    [place] Providers:  Organization         Address  Phone   Notes  [facility] [address], [place] [phone] Also  accepts self-pay patients.  [facility] [address], [place]  [phone]   [place], [place] [phone]   [facility] [address] [place] [phone]   Lucianne Lei [address], [place]   [phone] Only accepts [place] Access [place] patients after they have their name applied to their card.   Self-Pay (no insurance) in [facility]:  Organization         Address  Phone   Notes  Sickle Cell Patients, [facility] [place] [phone]   [facility] [place] [phone]   [facility] [place]  [place], [place], [place] [phone]   [facility]/Dr. Osei-Bonsu  [address] [place] [address], [place] or [address], [place] [phone] Phone number for both [place] and [place] locations is the same.  [facility] [address] [place] [phone]   [facility] [address], [place] or [address] [phone]   [facility] [address], [place] [phone], phone; [phone], fax Sees patients 1st and 3rd Saturday of every month.  Must not qualify for public or private insurance (i.e. Medicaid, Medicare, [place] Health Choice, Veterans' Benefits)  Household income should be no more than 200% of the poverty level The clinic cannot treat you if you are pregnant or think you are pregnant  Sexually transmitted diseases are not treated at the clinic.    Dental Care: Organization         Address  Phone  Notes  [facility] Department of [facility] [phone] Accepts children up to age 14 who are enrolled in [place] or [place]; pregnant  women with a Medicaid card; and children who have applied for Medicaid or [place] Health Choice, but were declined, whose parents can pay a reduced fee at time of service.  [facility] Department of [facility]  [address], [place] [phone] Accepts children up to age 57 who are enrolled in [place] or [place]; pregnant women with a Medicaid card; and children who have applied for Medicaid or [place] Health Choice, but were declined, whose parents can pay a reduced fee at time of service.  [facility]  [place] [phone] Patients are seen by appointment only. Walk-ins are not accepted. [place] will see patients 65 years of age and older. Monday - Tuesday (8am-5pm) Most Wednesdays (8:30-5pm) $30 per visit, cash only  [place] [facility]  [address], [place] [phone] Patients are seen by appointment only. Walk-ins are not accepted. [facility] will see patients 18 years of age and older. °One Wednesday Evening (Monthly: Volunteer Based).  $30 per visit, cash only  °[facility]  [phone] for adults; Children under age 4, call Graduate Pediatric Dentistry at [phone]. Children aged 4-14, please call [phone] to request a pediatric application. ° Dental services are provided in all areas of dental care including fillings, crowns and bridges, complete and partial dentures, implants, gum treatment, root canals, and extractions. Preventive care is also provided. Treatment is provided to both adults and children. °Patients are selected via a lottery and there is often a waiting list. °  °[facility] [address], °[place] ° ([phone] www.drcivils.com °  °[facility] [address], [place], [place] [phone], Ext. [phone] Second and Fourth Thursday of each month, opens at 6:30 AM; Clinic ends at 9 AM.  Patients are  seen on a first-come first-served basis, and a limited number are seen during each clinic.  ° °[facility] ° [address], [place], [place] [phone]   Eligibility Requirements °You must have lived in [place], [place], or [place] counties for at least the last three months. °  You cannot be eligible for state or federal sponsored healthcare insurance, including Veterans Administration, Medicaid, or Medicare. °  You generally cannot be eligible for healthcare insurance through your employer.  °  How to apply: °Eligibility screenings are held every Tuesday and Wednesday afternoon from 1:00 pm until 4:00 pm. You do not need an appointment for the interview!  °[facility] [address], [place], [place] [phone]   °[facility]  [phone]   °[facility]  [phone]   °[facility]  [phone]   ° °Behavioral Health Resources in the Community: °Intensive Outpatient Programs °Organization         Address  Phone  Notes  °[facility] [address], [place], [place] [phone]   °[facility] [address], [place], [place] [phone]   °[facility] [address], [place], [place] ° [phone]   °[facility] [address],  °[place], [place] 1-800-853-5163 or [phone]   °Substance Abuse Resources °Organization         Address  Phone  Notes  °[facility]  [phone]   °[facility]  [phone]   °[facility]  [phone]   °[facility]  336-845-3988   °Residential & Outpatient Substance Abuse Program  1-800-659-3381   °Psychological Services °Organization         Address  Phone  Notes  ° Health  336- [phone]   °[facility]  336- [phone]   °[facility] [address], [place] 1-800-853-5163 or 336-641-4981   ° °Mobile Crisis  Teams °Organization         Address  Phone  Notes  °[facility], Mobile Crisis Care Unit  1-877-626-1772   °Assertive °Psychotherapeutic Services ° [address] [place], [place] [phone]   °Sharon DeEsch [address] °[place] [phone]   ° °Self-Help/Support Groups °Organization         Address  Phone             Notes  °[facility] [place] -   variety of support groups  336- 336-389-6044 Call for more information  Narcotics Anonymous (NA), [facility] [address], [employer] [place]  2 meetings at this location   Residential Facilities manager         Address  Phone  Notes  [facility] [place],    [place]  [phone]   [facility]  [address] [place] [address], [place], [place]   [place], [place] [phone] Admissions: 8am-3pm M-F  [facility] [place] [address],    [place], [place] [phone]   [facility] [address] [place], [place], [place]   [facility] [address]  [place], [place]   [facility] - Intensive Outpatient [address], [place], [place]   [facility] [place] [address]  [place], [place] 1-640-386-3300 or [phone]   [facility] ([facility]) [address] [place], [place] Accepts Medicaid  [facility] [place] [address],  [place] 1-646-193-9807 Substance Abuse/Addiction Treatment   [facility] Organization         Address  Phone  Notes  [facility]  [phone]   Domenic Schwab, PhD [address] [place], [place]   ([phone]) [phone] or ([phone]   [facility], [place] [phone]   [facility] [address] [place], [place] [phone]  Insurance/Medicaid/sponsorship through [facility] and Families [address]                                    [place], [place] [phone] [place] [address], [place] [phone]    Dr. Adele Schilder  [phone]   [facility] Dept. 1) [address] [place] 2) [place] 3)  [place] [address], [place] [phone]  [phone]   [place] [phone] or [phone] (After Hours)

## 2015-09-08 NOTE — ED Notes (Signed)
Discharge papers given to pt - reviewed use of metformin . Pt repeated back understanding. Ambulated off unit

## 2015-09-08 NOTE — ED Notes (Signed)
IV attempted x2 by this nurse-- unsuccessful

## 2015-09-08 NOTE — ED Provider Notes (Signed)
CSN: [id number]     Arrival date & time 09/08/15  1633 History   First MD Initiated Contact with Patient 09/08/15 1717     Chief Complaint  Patient presents with  . Hyperglycemia    at home 467     (Consider location/radiation/quality/duration/timing/severity/associated sxs/prior Treatment) Patient is a 34 y.o. female presenting with hyperglycemia. The history is provided by the patient.  Hyperglycemia Associated symptoms: fatigue, nausea and polyuria   Associated symptoms: no abdominal pain, no chest pain, no confusion, no dysuria, no fever, no shortness of breath and no vomiting    patient here for concerns of her diabetes being out of control. Patient had a glucose test at home that measured 467. Patient been diagnosed with diabetes in the past and been on type II diabetic meds since age 28. Patient states she used to take metformin and also took NovoLog and Levemir. Patient used to be followed by the health department. Patient ablates been having frequent urination blurred vision and fatigue. Some nausea no vomiting prior to arrival. No fevers. Patient also has had a cough.  Past Medical History  Diagnosis Date  . Diabetes mellitus without complication (HCC)   . Macular degeneration   . Fatty liver disease, nonalcoholic    History reviewed. No pertinent past surgical history. History reviewed. No pertinent family history. Social History  Substance Use Topics  . Smoking status: Current Every Day Smoker -- 0.50 packs/day    Types: Cigarettes  . Smokeless tobacco: None  . Alcohol Use: No   OB History    No data available     Review of Systems  Constitutional: Positive for fatigue. Negative for fever.  HENT: Negative for congestion.   Eyes: Positive for visual disturbance.  Respiratory: Positive for cough. Negative for shortness of breath.   Cardiovascular: Negative for chest pain.  Gastrointestinal: Positive for nausea. Negative for vomiting and abdominal pain.  Endocrine:  Positive for polyuria.  Genitourinary: Negative for dysuria.  Musculoskeletal: Negative for back pain.  Skin: Negative for rash.  Neurological: Positive for headaches.  Hematological: Does not bruise/bleed easily.  Psychiatric/Behavioral: Negative for confusion.      Allergies  Shellfish allergy  Home Medications   Prior to Admission medications   Medication Sig Start Date End Date Taking? Authorizing Provider  calcium carbonate (TUMS - DOSED IN MG ELEMENTAL CALCIUM) 500 MG chewable tablet Chew 1 tablet by mouth daily as needed for indigestion or heartburn.   Yes Historical Provider, MD  insulin aspart (NOVOLOG) 100 UNIT/ML injection Inject 5 Units into the skin 3 (three) times daily before meals.     Historical Provider, MD  insulin detemir (LEVEMIR) 100 UNIT/ML injection Inject 25 Units into the skin 2 (two) times daily.     Historical Provider, MD  ranitidine (ZANTAC) 150 MG capsule Take 1 capsule (150 mg total) by mouth 2 (two) times daily. Patient not taking: Reported on 09/08/2015 08/25/15   [name], MD   BP 147/92 mmHg  Pulse 86  Temp(Src) 98.2 F (36.8 C) (Oral)  Resp 16  Ht  (1.702 m)  Wt 300 lb (136.079 kg)  BMI 46.98 kg/m2  SpO2 100%  LMP 08/10/2015 Physical Exam  Constitutional: She is oriented to person, place, and time. She appears well-developed and well-nourished. No distress.  HENT:  Head: Normocephalic and atraumatic.  Mouth/Throat: Oropharynx is clear and moist.  Eyes: Conjunctivae and EOM are normal. Pupils are equal, round, and reactive to light.  Neck: Normal range of motion.  Cardiovascular: Normal rate, regular rhythm and normal heart sounds.   No murmur heard. Pulmonary/Chest: Effort normal and breath sounds normal. No respiratory distress.  Abdominal: Soft. Bowel sounds are normal. There is no tenderness.  Musculoskeletal: Normal range of motion.  Neurological: She is alert and oriented to person, place, and time. No cranial nerve  deficit. She exhibits normal muscle tone. Coordination normal.  Skin: Skin is warm. She is not diaphoretic.  Nursing note and vitals reviewed.   ED Course  Procedures (including critical care time) Labs Review Labs Reviewed  URINALYSIS, ROUTINE W REFLEX MICROSCOPIC (NOT AT [facility]) - Abnormal; Notable for the following:    Glucose, UA >1000 (*)    Hgb urine dipstick LARGE (*)    Protein, ur 30 (*)    All other components within normal limits  CBC WITH DIFFERENTIAL/PLATELET - Abnormal; Notable for the following:    Lymphs Abs 4.4 (*)    All other components within normal limits  BASIC METABOLIC PANEL - Abnormal; Notable for the following:    Sodium 134 (*)    Glucose, Bld 300 (*)    Calcium 8.4 (*)    All other components within normal limits  URINE MICROSCOPIC-ADD ON - Abnormal; Notable for the following:    Squamous Epithelial / LPF MANY (*)    Bacteria, UA FEW (*)    All other components within normal limits  CBG MONITORING, ED - Abnormal; Notable for the following:    Glucose-Capillary 314 (*)    All other components within normal limits  CBG MONITORING, ED - Abnormal; Notable for the following:    Glucose-Capillary 237 (*)    All other components within normal limits  CBG MONITORING, ED - Abnormal; Notable for the following:    Glucose-Capillary 221 (*)    All other components within normal limits  PREGNANCY, URINE  CBG MONITORING, ED   Results for orders placed or performed during the hospital encounter of 09/08/15  Urinalysis, Routine w reflex microscopic (not at [facility])  Result Value Ref Range   Color, Urine YELLOW YELLOW   APPearance CLEAR CLEAR   Specific Gravity, Urine 1.015 1.005 - 1.030   pH 6.0 5.0 - 8.0   Glucose, UA >1000 (A) NEGATIVE mg/dL   Hgb urine dipstick LARGE (A) NEGATIVE   Bilirubin Urine NEGATIVE NEGATIVE   Ketones, ur NEGATIVE NEGATIVE mg/dL   Protein, ur 30 (A) NEGATIVE mg/dL   Urobilinogen, UA 0.2 0.0 - 1.0 mg/dL   Nitrite NEGATIVE NEGATIVE    Leukocytes, UA NEGATIVE NEGATIVE  Pregnancy, urine  Result Value Ref Range   Preg Test, Ur NEGATIVE NEGATIVE  CBC with Differential/Platelet  Result Value Ref Range   WBC 8.0 4.0 - 10.5 K/uL   RBC 5.03 3.87 - 5.11 MIL/uL   Hemoglobin 13.9 12.0 - 15.0 g/dL   HCT 78.2 95.6 - 21.3 %   MCV 81.9 78.0 - 100.0 fL   MCH 27.6 26.0 - 34.0 pg   MCHC 33.7 30.0 - 36.0 g/dL   RDW 08.6 57.8 - 46.9 %   Platelets 202 150 - 400 K/uL   Neutrophils Relative % 36 %   Neutro Abs 2.9 1.7 - 7.7 K/uL   Lymphocytes Relative 55 %   Lymphs Abs 4.4 (H) 0.7 - 4.0 K/uL   Monocytes Relative 7 %   Monocytes Absolute 0.6 0.1 - 1.0 K/uL   Eosinophils Relative 1 %   Eosinophils Absolute 0.1 0.0 - 0.7 K/uL   Basophils Relative 1 %  Basophils Absolute 0.0 0.0 - 0.1 K/uL  Basic metabolic panel  Result Value Ref Range   Sodium 134 (L) 135 - 145 mmol/L   Potassium 4.2 3.5 - 5.1 mmol/L   Chloride 105 101 - 111 mmol/L   CO2 23 22 - 32 mmol/L   Glucose, Bld 300 (H) 65 - 99 mg/dL   BUN 13 6 - 20 mg/dL   Creatinine, Ser 1.61 0.44 - 1.00 mg/dL   Calcium 8.4 (L) 8.9 - 10.3 mg/dL   GFR calc non Af Amer >60 >60 mL/min   GFR calc Af Amer >60 >60 mL/min   Anion gap 6 5 - 15  Urine microscopic-add on  Result Value Ref Range   Squamous Epithelial / LPF MANY (A) RARE   WBC, UA 0-2 <3 WBC/hpf   RBC / HPF 21-50 <3 RBC/hpf   Bacteria, UA FEW (A) RARE  POC CBG, ED  Result Value Ref Range   Glucose-Capillary 314 (H) 65 - 99 mg/dL  CBG monitoring, ED  Result Value Ref Range   Glucose-Capillary 237 (H) 65 - 99 mg/dL  POC CBG, ED  Result Value Ref Range   Glucose-Capillary 221 (H) 65 - 99 mg/dL     Imaging Review Dg Chest 2 View  09/08/2015   CLINICAL DATA:  Cough  EXAM: CHEST  2 VIEW  COMPARISON:  08/25/2015  FINDINGS: Normal heart size. Clear lungs. No pneumothorax. No pleural effusion.  IMPRESSION: No active cardiopulmonary disease.   Electronically Signed   By: Jolaine Click M.D.   On: 09/08/2015 19:55   I have  personally reviewed and evaluated these images and lab results as part of my medical decision-making.   EKG Interpretation None      MDM   Final diagnoses:  Hyperglycemia    Patient with history of type 2 diabetes in the past. Been off medications for a year followed by the health department in the past. Patient here without any evidence of metabolic acidosis. Blood sugar showing signs of improvement and without any insulin. We did discover that patient did take thousand milligrams of metformin that belonged to her mother. This may explain the blood sugar coming down. She took this medication around 3:00 in the afternoon.  The patient will be started on 500 mg of metformin twice a day resource referral information provided for follow-up for her diabetes.    Vanetta Mulders, MD 09/08/15 2052

## 2016-03-19 ENCOUNTER — Encounter (HOSPITAL_COMMUNITY): Payer: Self-pay | Admitting: *Deleted

## 2016-03-19 ENCOUNTER — Emergency Department (HOSPITAL_COMMUNITY)
Admission: EM | Admit: 2016-03-19 | Discharge: 2016-03-19 | Disposition: A | Discharge status: Home / Self Care | Payer: Self-pay | Attending: Emergency Medicine | Admitting: Emergency Medicine

## 2016-03-19 DIAGNOSIS — Z79899 Other long term (current) drug therapy: Secondary | ICD-10-CM | POA: Insufficient documentation

## 2016-03-19 DIAGNOSIS — E1165 Type 2 diabetes mellitus with hyperglycemia: Secondary | ICD-10-CM | POA: Insufficient documentation

## 2016-03-19 DIAGNOSIS — R739 Hyperglycemia, unspecified: Secondary | ICD-10-CM

## 2016-03-19 DIAGNOSIS — F17210 Nicotine dependence, cigarettes, uncomplicated: Secondary | ICD-10-CM | POA: Insufficient documentation

## 2016-03-19 DIAGNOSIS — N390 Urinary tract infection, site not specified: Secondary | ICD-10-CM | POA: Insufficient documentation

## 2016-03-19 DIAGNOSIS — Z7984 Long term (current) use of oral hypoglycemic drugs: Secondary | ICD-10-CM | POA: Insufficient documentation

## 2016-03-19 LAB — URINALYSIS, ROUTINE W REFLEX MICROSCOPIC
Bilirubin Urine: NEGATIVE
Ketones, ur: NEGATIVE mg/dL
LEUKOCYTES UA: NEGATIVE
Nitrite: POSITIVE — AB
PH: 6 (ref 5.0–8.0)
Specific Gravity, Urine: 1.015 (ref 1.005–1.030)

## 2016-03-19 LAB — CBG MONITORING, ED
GLUCOSE-CAPILLARY: 290 mg/dL — AB (ref 65–99)
GLUCOSE-CAPILLARY: 313 mg/dL — AB (ref 65–99)

## 2016-03-19 LAB — URINE MICROSCOPIC-ADD ON

## 2016-03-19 LAB — PREGNANCY, URINE: Preg Test, Ur: NEGATIVE

## 2016-03-19 MED ORDER — PHENAZOPYRIDINE HCL 200 MG PO TABS: 200.0000 mg | ORAL_TABLET | Freq: Three times a day (TID) | ORAL | Status: DC

## 2016-03-19 MED ORDER — CEPHALEXIN 500 MG PO CAPS: 500.0000 mg | ORAL_CAPSULE | Freq: Four times a day (QID) | ORAL | Status: DC

## 2016-03-19 NOTE — ED Notes (Signed)
Pt. C/o dysuria and low back pain since Sunday.

## 2016-03-19 NOTE — ED Notes (Signed)
POC CBG 290.

## 2016-03-19 NOTE — ED Provider Notes (Signed)
CSN: [id number]     Arrival date & time 03/19/16  [id number] History   First MD Initiated Contact with Patient 03/19/16 [phone]     Chief Complaint  Patient presents with  . Dysuria     (Consider location/radiation/quality/duration/timing/severity/associated sxs/prior Treatment) HPI   Rachel George is a 35 y.o. female who presents to the Emergency Department complaining of urinary frequency, burning and right sided low back pain for two days.  She reports hx of frequent UTI's and symptoms feel similar to previous.  She also reports having dark colored urine and urinary hesistancy.  She denies fever, chills, vomiting, vaginal bleeding or discharge.  No new sexual partners.  Past Medical History  Diagnosis Date  . Diabetes mellitus without complication (HCC)   . Macular degeneration   . Fatty liver disease, nonalcoholic    History reviewed. No pertinent past surgical history. History reviewed. No pertinent family history. Social History  Substance Use Topics  . Smoking status: Current Every Day Smoker -- 0.50 packs/day    Types: Cigarettes  . Smokeless tobacco: None  . Alcohol Use: No   OB History    No data available     Review of Systems  Constitutional: Negative for fever, chills, activity change and appetite change.  Respiratory: Negative for chest tightness and shortness of breath.   Gastrointestinal: Negative for nausea, vomiting and abdominal pain.  Genitourinary: Positive for dysuria, urgency and frequency. Negative for hematuria, flank pain, decreased urine volume and difficulty urinating.  Musculoskeletal: Negative for back pain.  Skin: Negative for rash.  Neurological: Negative for dizziness, weakness and numbness.  Hematological: Negative for adenopathy.  Psychiatric/Behavioral: Negative for confusion.  All other systems reviewed and are negative.     Allergies  Shellfish allergy  Home Medications   Prior to Admission medications   Medication Sig Start Date  End Date Taking? Authorizing Provider  calcium carbonate (TUMS - DOSED IN MG ELEMENTAL CALCIUM) 500 MG chewable tablet Chew 1 tablet by mouth daily as needed for indigestion or heartburn.    Historical Provider, MD  insulin aspart (NOVOLOG) 100 UNIT/ML injection Inject 5 Units into the skin 3 (three) times daily before meals.     Historical Provider, MD  insulin detemir (LEVEMIR) 100 UNIT/ML injection Inject 25 Units into the skin 2 (two) times daily.     Historical Provider, MD  metFORMIN (GLUCOPHAGE) 500 MG tablet Take 1 tablet (500 mg total) by mouth 2 (two) times daily with a meal. 09/08/15   Vanetta MuldersScott Zackowski, MD  ranitidine (ZANTAC) 150 MG capsule Take 1 capsule (150 mg total) by mouth 2 (two) times daily. Patient not taking: Reported on 09/08/2015 08/25/15   Bethann BerkshireJoseph Zammit, MD   BP 159/93 mmHg  Pulse 90  Temp(Src) 98.3 F (36.8 C) (Oral)  Resp 16  Ht 5\' 7"  (1.702 m)  Wt 136.079 kg  BMI 46.98 kg/m2  SpO2 100% Physical Exam  Constitutional: She is oriented to person, place, and time. She appears well-developed and well-nourished. No distress.  HENT:  Head: Normocephalic and atraumatic.  Mouth/Throat: Oropharynx is clear and moist.  Cardiovascular: Normal rate, regular rhythm, normal heart sounds and intact distal pulses.   No murmur heard. Pulmonary/Chest: Effort normal and breath sounds normal. No respiratory distress. She has no wheezes. She has no rales.  Abdominal: Soft. Normal appearance. She exhibits no distension and no mass. There is no hepatosplenomegaly. There is tenderness in the suprapubic area. There is no rigidity, no rebound, no guarding, no CVA tenderness and no  tenderness at McBurney's point.  Mild ttp of the suprapubic region.  Remaining abdomen is soft, non-tender without guarding or rebound tenderness. No CVA tenderness  Musculoskeletal: Normal range of motion. She exhibits no edema.  Neurological: She is alert and oriented to person, place, and time. Coordination  normal.  Skin: Skin is warm and dry. No rash noted.  Nursing note and vitals reviewed.   ED Course  Procedures (including critical care time) Labs Review Labs Reviewed  URINE CULTURE - Abnormal; Notable for the following:    Culture >=100,000 COLONIES/mL ESCHERICHIA COLI (*)    All other components within normal limits  URINALYSIS, ROUTINE W REFLEX MICROSCOPIC (NOT AT [facility]) - Abnormal; Notable for the following:    Glucose, UA >1000 (*)    Hgb urine dipstick SMALL (*)    Protein, ur TRACE (*)    Nitrite POSITIVE (*)    All other components within normal limits  URINE MICROSCOPIC-ADD ON - Abnormal; Notable for the following:    Squamous Epithelial / LPF 0-5 (*)    Bacteria, UA MANY (*)    All other components within normal limits  CBG MONITORING, ED - Abnormal; Notable for the following:    Glucose-Capillary 290 (*)    All other components within normal limits  CBG MONITORING, ED - Abnormal; Notable for the following:    Glucose-Capillary 313 (*)    All other components within normal limits  PREGNANCY, URINE    Imaging Review No results found. I have personally reviewed and evaluated these images and lab results as part of my medical decision-making.   EKG Interpretation None      MDM   Final diagnoses:  UTI (urinary tract infection), uncomplicated  Hyperglycemia    Pt is well appearing, non-toxic.  Vitals stable.  No fever, vomiting or CVA tenderness to suggest pyelonephritis.  Blood sugar elevated w/o symptoms.  Pt admits to eating a meal and drinking coffee with sugar prior to arrival.  Rx for keflex and pyridium.  She agrees to ER return for any worsening sxs.    Rosey Bath 03/21/16 2129  Zadie Rhine, MD 03/22/16 [phone]

## 2016-03-19 NOTE — Discharge Instructions (Signed)
Urinary Tract Infection A urinary tract infection (UTI) can occur any place along the urinary tract. The tract includes the kidneys, ureters, bladder, and urethra. A type of germ called bacteria often causes a UTI. UTIs are often helped with antibiotic medicine.  HOME CARE   If given, take antibiotics as told by your doctor. Finish them even if you start to feel better.  Drink enough fluids to keep your pee (urine) clear or pale yellow.  Avoid tea, drinks with caffeine, and bubbly (carbonated) drinks.  Pee often. Avoid holding your pee in for a long time.  Pee before and after having sex (intercourse).  Wipe from front to back after you poop (bowel movement) if you are a woman. Use each tissue only once. GET HELP RIGHT AWAY IF:   You have back pain.  You have lower belly (abdominal) pain.  You have chills.  You feel sick to your stomach (nauseous).  You throw up (vomit).  Your burning or discomfort with peeing does not go away.  You have a fever.  Your symptoms are not better in 3 days. MAKE SURE YOU:   Understand these instructions.  Will watch your condition.  Will get help right away if you are not doing well or get worse.   This information is not intended to replace advice given to you by your health care provider. Make sure you discuss any questions you have with your health care provider.   Document Released: 05/06/2008 Document Revised: 12/09/2014 Document Reviewed: 06/18/2012 Elsevier Interactive Patient Education 2016 Elsevier Inc.  Hyperglycemia High blood sugar (hyperglycemia) means that the level of sugar in your blood is higher than it should be. Signs of high blood sugar include:  Feeling thirsty.  Frequent peeing (urinating).  Feeling tired or sleepy.  Dry mouth.  Vision changes.  Feeling weak.  Feeling hungry but losing weight.  Numbness and tingling in your hands or feet.  Headache. When you ignore these signs, your blood sugar may  keep going up. These problems may get worse, and other problems may begin. HOME CARE  Check your blood sugars as told by your doctor. Write down the numbers with the date and time.  Take the right amount of insulin or diabetes pills at the right time. Write down the dose with date and time.  Refill your insulin or diabetes pills before running out.  Watch what you eat. Follow your meal plan.  Drink liquids without sugar, such as water. Check with your doctor if you have kidney or heart disease.  Follow your doctor's orders for exercise. Exercise at the same time of day.  Keep your doctor's appointments. GET HELP RIGHT AWAY IF:   You have trouble thinking or are confused.  You have fast breathing with fruity smelling breath.  You pass out (faint).  You have 2 to 3 days of high blood sugars and you do not know why.  You have chest pain.  You are feeling sick to your stomach (nauseous) or throwing up (vomiting).  You have sudden vision changes. MAKE SURE YOU:   Understand these instructions.  Will watch your condition.  Will get help right away if you are not doing well or get worse.   This information is not intended to replace advice given to you by your health care provider. Make sure you discuss any questions you have with your health care provider.   Document Released: 09/15/2009 Document Revised: 12/09/2014 Document Reviewed: 07/25/2015 Elsevier Interactive Patient Education [employer] Elsevier Inc.

## 2016-03-22 LAB — URINE CULTURE

## 2016-03-23 ENCOUNTER — Telehealth: Payer: Self-pay

## 2016-03-23 NOTE — Telephone Encounter (Signed)
Post ED Visit - Positive Culture Follow-up  Culture report reviewed by antimicrobial stewardship pharmacist:  []  Rachel George, Pharm.D. []  Rachel George, Pharm.D., BCPS []  Rachel George, Pharm.D. []  Rachel George, Pharm.D., BCPS []  Rachel George, [address] BCPS, AAHIVP []  Rachel George, Pharm.D., BCPS, AAHIVP [x]  Rachel George, Pharm.D. []  Rachel George, [address]  Positive urine culture Treated with Cephalexin, organism sensitive to the same and no further patient follow-up is required at this time.  Rachel George, Rachel George 03/23/2016, 8:19 AM

## 2016-04-16 ENCOUNTER — Emergency Department (HOSPITAL_COMMUNITY)
Admission: EM | Admit: 2016-04-16 | Discharge: 2016-04-16 | Disposition: A | Discharge status: Home / Self Care | Payer: Self-pay | Attending: Emergency Medicine | Admitting: Emergency Medicine

## 2016-04-16 ENCOUNTER — Encounter (HOSPITAL_COMMUNITY): Payer: Self-pay

## 2016-04-16 ENCOUNTER — Emergency Department (HOSPITAL_COMMUNITY): Payer: Self-pay

## 2016-04-16 DIAGNOSIS — R197 Diarrhea, unspecified: Secondary | ICD-10-CM | POA: Insufficient documentation

## 2016-04-16 DIAGNOSIS — R739 Hyperglycemia, unspecified: Secondary | ICD-10-CM

## 2016-04-16 DIAGNOSIS — E1165 Type 2 diabetes mellitus with hyperglycemia: Secondary | ICD-10-CM | POA: Insufficient documentation

## 2016-04-16 DIAGNOSIS — Z7984 Long term (current) use of oral hypoglycemic drugs: Secondary | ICD-10-CM | POA: Insufficient documentation

## 2016-04-16 DIAGNOSIS — R110 Nausea: Secondary | ICD-10-CM | POA: Insufficient documentation

## 2016-04-16 DIAGNOSIS — Z79899 Other long term (current) drug therapy: Secondary | ICD-10-CM | POA: Insufficient documentation

## 2016-04-16 DIAGNOSIS — R1011 Right upper quadrant pain: Secondary | ICD-10-CM | POA: Insufficient documentation

## 2016-04-16 DIAGNOSIS — F17210 Nicotine dependence, cigarettes, uncomplicated: Secondary | ICD-10-CM | POA: Insufficient documentation

## 2016-04-16 DIAGNOSIS — M549 Dorsalgia, unspecified: Secondary | ICD-10-CM | POA: Insufficient documentation

## 2016-04-16 LAB — URINALYSIS, ROUTINE W REFLEX MICROSCOPIC
BILIRUBIN URINE: NEGATIVE
Glucose, UA: >1000 mg/dL — AB
Ketones, ur: NEGATIVE mg/dL
LEUKOCYTES UA: NEGATIVE
NITRITE: NEGATIVE
PH: 5.5 (ref 5.0–8.0)
SPECIFIC GRAVITY, URINE: 1.02 (ref 1.005–1.030)

## 2016-04-16 LAB — CBC WITH DIFFERENTIAL/PLATELET
Basophils Absolute: 0 10*3/uL (ref 0.0–0.1)
Basophils Relative: 0 %
EOS ABS: 0.2 10*3/uL (ref 0.0–0.7)
EOS PCT: 2 %
HCT: 45 % (ref 36.0–46.0)
HEMOGLOBIN: 14.6 g/dL (ref 12.0–15.0)
LYMPHS ABS: 4.9 10*3/uL — AB (ref 0.7–4.0)
Lymphocytes Relative: 65 %
MCH: 26.8 pg (ref 26.0–34.0)
MCHC: 32.4 g/dL (ref 30.0–36.0)
MCV: 82.6 fL (ref 78.0–100.0)
MONO ABS: 0.4 10*3/uL (ref 0.1–1.0)
MONOS PCT: 5 %
NEUTROS PCT: 28 %
Neutro Abs: 2.1 10*3/uL (ref 1.7–7.7)
Platelets: 209 10*3/uL (ref 150–400)
RBC: 5.45 MIL/uL — ABNORMAL HIGH (ref 3.87–5.11)
RDW: 14.3 % (ref 11.5–15.5)
WBC: 7.6 10*3/uL (ref 4.0–10.5)

## 2016-04-16 LAB — COMPREHENSIVE METABOLIC PANEL
ALK PHOS: 79 U/L (ref 38–126)
ALT: 14 U/L (ref 14–54)
ANION GAP: 7 (ref 5–15)
AST: 13 U/L — ABNORMAL LOW (ref 15–41)
Albumin: 3.6 g/dL (ref 3.5–5.0)
BILIRUBIN TOTAL: 0.4 mg/dL (ref 0.3–1.2)
BUN: 10 mg/dL (ref 6–20)
CHLORIDE: 104 mmol/L (ref 101–111)
CO2: 23 mmol/L (ref 22–32)
CREATININE: 0.75 mg/dL (ref 0.44–1.00)
Calcium: 8.8 mg/dL — ABNORMAL LOW (ref 8.9–10.3)
GFR calc non Af Amer: >60 mL/min (ref >60)
Glucose, Bld: 301 mg/dL — ABNORMAL HIGH (ref 65–99)
Potassium: 4.4 mmol/L (ref 3.5–5.1)
SODIUM: 134 mmol/L — AB (ref 135–145)
Total Protein: 7.3 g/dL (ref 6.5–8.1)

## 2016-04-16 LAB — URINE MICROSCOPIC-ADD ON

## 2016-04-16 LAB — PREGNANCY, URINE: PREG TEST UR: NEGATIVE

## 2016-04-16 LAB — TROPONIN I

## 2016-04-16 MED ORDER — ONDANSETRON 8 MG PO TBDP
8.0000 mg | ORAL_TABLET | Freq: Once | ORAL | Status: AC
  Administered 2016-04-16: 8 mg via ORAL
  Filled 2016-04-16: qty 1

## 2016-04-16 MED ORDER — HYDROCODONE-ACETAMINOPHEN 5-325 MG PO TABS: 1.0000 | ORAL_TABLET | ORAL | Status: DC | PRN

## 2016-04-16 MED ORDER — OXYCODONE-ACETAMINOPHEN 5-325 MG PO TABS
1.0000 | ORAL_TABLET | Freq: Once | ORAL | Status: AC
  Administered 2016-04-16: 1 via ORAL
  Filled 2016-04-16: qty 1

## 2016-04-16 NOTE — ED Provider Notes (Addendum)
CSN: [id number]     Arrival date & time 04/16/16  1040 History  By signing my name below, I, Rachel George, attest that this documentation has been prepared under the direction and in the presence of Mancel Bale, MD. Electronically Signed: Angelene Giovanni, ED Scribe. 04/16/2016. 12:31 PM.    Chief Complaint  Patient presents with  . Abdominal Pain   Patient is a 35 y.o. female presenting with abdominal pain. The history is provided by the patient. No language interpreter was used.  Abdominal Pain Pain location:  RUQ Pain radiates to:  Back Pain severity:  Moderate Onset quality:  Gradual Duration:  12 hours Timing:  Intermittent Progression:  Worsening Chronicity:  New Relieved by:  None tried Worsened by:  Nothing tried Ineffective treatments:  None tried Associated symptoms: diarrhea and nausea   Associated symptoms: no dysuria, no fever, no hematuria and no vomiting    HPI Comments: Rachel George is a 35 y.o. female with a hx of DM who presents to the Emergency Department complaining of gradually worsening intermittent RUQ abdominal pain onset last night. She reports associated nausea prior to onset. She adds 2 episodes of non-bloody diarrhea, one episode of non-bloody vomiting, and mild right lower back pain. Pt explains that the pain started when she was laying down after eating potatoes. No alleviating factors noted. Pt has not tried any medication PTA. She is a current smoker but she denies any alcohol use. Pt is currently on Metformin and she states that her home blood sugar runs between 160-200. She denies any fever, or urinary symptoms. There are no other known modifying factors.  No PCP but [facility].    Past Medical History  Diagnosis Date  . Diabetes mellitus without complication (HCC)   . Macular degeneration   . Fatty liver disease, nonalcoholic    History reviewed. No pertinent past surgical history. No family history on file. Social  History  Substance Use Topics  . Smoking status: Current Every Day Smoker -- 0.50 packs/day    Types: Cigarettes  . Smokeless tobacco: None  . Alcohol Use: No   OB History    No data available     Review of Systems  Constitutional: Negative for fever.  Gastrointestinal: Positive for nausea, abdominal pain and diarrhea. Negative for vomiting.  Genitourinary: Negative for dysuria and hematuria.  Musculoskeletal: Positive for back pain.  All other systems reviewed and are negative.     Allergies  Shellfish allergy  Home Medications   Prior to Admission medications   Medication Sig Start Date End Date Taking? Authorizing Provider  bismuth subsalicylate (PEPTO BISMOL) 262 MG/15ML suspension Take 30 mLs by mouth every 6 (six) hours as needed.   Yes Historical Provider, MD  diphenhydrAMINE (BENADRYL) 25 MG tablet Take 25 mg by mouth every 6 (six) hours as needed for allergies.   Yes Historical Provider, MD  metFORMIN (GLUCOPHAGE) 500 MG tablet Take 1 tablet (500 mg total) by mouth 2 (two) times daily with a meal. 09/08/15  Yes Vanetta Mulders, MD  phenazopyridine (PYRIDIUM) 200 MG tablet Take 1 tablet (200 mg total) by mouth 3 (three) times daily. 03/19/16  Yes Tammy Triplett, PA-C  tetrahydrozoline-zinc (VISINE-AC) 0.05-0.25 % ophthalmic solution Place 2 drops into both eyes 3 (three) times daily as needed.   Yes Historical Provider, MD  cephALEXin (KEFLEX) 500 MG capsule Take 1 capsule (500 mg total) by mouth 4 (four) times daily. For 7 days Patient not taking: Reported on 04/16/2016 03/19/16  Tammy Triplett, PA-C  HYDROcodone-acetaminophen (NORCO) 5-325 MG tablet Take 1-2 tablets by mouth every 4 (four) hours as needed. 04/16/16   Mancel Bale, MD  insulin aspart (NOVOLOG) 100 UNIT/ML injection Inject 5 Units into the skin 3 (three) times daily before meals. Reported on 04/16/2016    Historical Provider, MD  insulin detemir (LEVEMIR) 100 UNIT/ML injection Inject 25 Units into the skin 2  (two) times daily. Reported on 04/16/2016    Historical Provider, MD   BP 148/66 mmHg  Pulse 85  Temp(Src) 98.5 F (36.9 C) (Oral)  Resp 20  Ht 5\' 6"  (1.676 m)  Wt 300 lb (136.079 kg)  BMI 48.44 kg/m2  SpO2 100%  LMP 03/18/2016 Physical Exam  Constitutional: She is oriented to person, place, and time. She appears well-developed and well-nourished.  HENT:  Head: Normocephalic and atraumatic.  Cardiovascular: Normal rate.   Pulmonary/Chest: Effort normal.  Abdominal: There is tenderness. There is guarding.  RUQ moderate tenderness with guarding  Musculoskeletal:  Diffuse right lumbar pain  Neurological: She is alert and oriented to person, place, and time.  Skin: Skin is warm and dry.  Psychiatric: She has a normal mood and affect.  Nursing note and vitals reviewed.   ED Course  Procedures (including critical care time) DIAGNOSTIC STUDIES: Oxygen Saturation is 100% on RA, normal by my interpretation.    COORDINATION OF CARE: 12:27 PM- Pt advised of plan for treatment and pt agrees. Pt will receive lab work and US abdomen for further evaluation. She will receive Percocet, Zofran, and a work note.   Medications  ondansetron (ZOFRAN-ODT) disintegrating tablet 8 mg (8 mg Oral Given 04/16/16 1234)  oxyCODONE-acetaminophen (PERCOCET/ROXICET) 5-325 MG per tablet 1 tablet (1 tablet Oral Given 04/16/16 1234)    Patient Vitals for the past 24 hrs:  BP Temp Temp src Pulse Resp SpO2 Height Weight  04/16/16 1054 148/66 mmHg 98.5 F (36.9 C) Oral 85 20 100 % 5\' 6"  (1.676 m) 300 lb (136.079 kg)    1:28 PM Reevaluation with update and discussion. After initial assessment and treatment, an updated evaluation reveals She is comfortable at this time. She expresses no further complaints. Findings discussed with the patient and all questions were answered. [name]     Labs Review Labs Reviewed  CBC WITH DIFFERENTIAL/PLATELET - Abnormal; Notable for the following:    RBC 5.45 (*)     Lymphs Abs 4.9 (*)    All other components within normal limits  COMPREHENSIVE METABOLIC PANEL - Abnormal; Notable for the following:    Sodium 134 (*)    Glucose, Bld 301 (*)    Calcium 8.8 (*)    AST 13 (*)    All other components within normal limits  URINALYSIS, ROUTINE W REFLEX MICROSCOPIC (NOT AT [facility]) - Abnormal; Notable for the following:    APPearance HAZY (*)    Glucose, UA >1000 (*)    Hgb urine dipstick TRACE (*)    Protein, ur TRACE (*)    All other components within normal limits  URINE MICROSCOPIC-ADD ON - Abnormal; Notable for the following:    Squamous Epithelial / LPF 6-30 (*)    Bacteria, UA MANY (*)    All other components within normal limits  URINE CULTURE  PREGNANCY, URINE  TROPONIN I    Imaging Review US Abdomen Complete  04/16/2016  CLINICAL DATA:  Right lower quadrant pain starting last night, nausea and vomiting EXAM: ABDOMEN ULTRASOUND COMPLETE COMPARISON:  04/17/2015 FINDINGS: Gallbladder: No gallstones or  wall thickening visualized. No sonographic Murphy sign noted by sonographer. Common bile duct: Diameter: 4 mm in diameter within normal limits. Liver: No focal lesion identified. Within normal limits in parenchymal echogenicity. IVC: No abnormality visualized. Pancreas: Visualized portion unremarkable. Partially obscured by bowel gas. Spleen: Size and appearance within normal limits. Measures 8.4 cm in length Right Kidney: Length: 12 cm. Echogenicity within normal limits. No mass or hydronephrosis visualized. Left Kidney: Length: 13.4 cm. Echogenicity within normal limits. No mass or hydronephrosis visualized. Abdominal aorta: No aneurysm visualized. Distal aorta and aortic bifurcation is obscured by bowel gas. Other findings: None. IMPRESSION: No gallstones are noted within gallbladder. Normal CBD. No hydronephrosis. Suboptimal exam due to abundant bowel gas. No aortic aneurysm. Electronically Signed   By: Natasha MeadLiviu  Pop M.D.   On: 04/16/2016 13:18      Mancel BaleElliott Courtney Fenlon, MD has personally reviewed and evaluated these images and lab results as part of his medical decision-making.   EKG Interpretation None      MDM   Final diagnoses:  Right upper quadrant pain  Hyperglycemia    Nonspecific right upper quadrant pain, differential diagnosis includes gallstones, UTI, muscle strain and distal cramping. ED evaluation is reassuring. Doubt cholecystitis, cholelithiasis, UTI, intestinal obstruction or colitis.  Nursing Notes Reviewed/ Care Coordinated Applicable Imaging Reviewed Interpretation of Laboratory Data incorporated into ED treatment  The patient appears reasonably screened and/or stabilized for discharge and I doubt any other medical condition or other [facility] requiring further screening, evaluation, or treatment in the ED at this time prior to discharge.  Plan: Home Medications- Norco; Home Treatments- rest, heat to area; return here if the recommended treatment, does not improve the symptoms; Recommended follow up- PCP prn   I personally performed the services described in this documentation, which was scribed in my presence. The recorded information has been reviewed and is accurate.    Mancel BaleElliott Jaina Morin, MD 04/16/16 1339  Mancel BaleElliott Lorilynn Lehr, MD 04/16/16 1340

## 2016-04-16 NOTE — ED Notes (Signed)
Patient with no complaints at this time. Respirations even and unlabored. Skin warm/dry. Discharge instructions reviewed with patient at this time. Patient given opportunity to voice concerns/ask questions. Patient discharged at this time and left Emergency Department with steady gait.   

## 2016-04-16 NOTE — Discharge Instructions (Signed)
There is no clear cause today for the pain. There are no worrisome signs on the testing today. Her blood sugar was a little bit over 300, so it needs to be lowered. Try to drink 1 or 2 L of water each day to help lower the blood sugar. Careful to avoid any concentrated sweets, and limit carbohydrates in your diet. See the instructions below to help you with your sugar intake. For the abdominal pain, try using some heat on the area and resting for a couple of days.  Abdominal Pain, Adult Many things can cause abdominal pain. Usually, abdominal pain is not caused by a disease and will improve without treatment. It can often be observed and treated at home. Your health care provider will do a physical exam and possibly order blood tests and X-rays to help determine the seriousness of your pain. However, in many cases, more time must pass before a clear cause of the pain can be found. Before that point, your health care provider may not know if you need more testing or further treatment. HOME CARE INSTRUCTIONS Monitor your abdominal pain for any changes. The following actions may help to alleviate any discomfort you are experiencing:  Only take over-the-counter or prescription medicines as directed by your health care provider.  Do not take laxatives unless directed to do so by your health care provider.  Try a clear liquid diet (broth, tea, or water) as directed by your health care provider. Slowly move to a bland diet as tolerated. SEEK MEDICAL CARE IF:  You have unexplained abdominal pain.  You have abdominal pain associated with nausea or diarrhea.  You have pain when you urinate or have a bowel movement.  You experience abdominal pain that wakes you in the night.  You have abdominal pain that is worsened or improved by eating food.  You have abdominal pain that is worsened with eating fatty foods.  You have a fever. SEEK IMMEDIATE MEDICAL CARE IF:  Your pain does not go away  within 2 hours.  You keep throwing up (vomiting).  Your pain is felt only in portions of the abdomen, such as the right side or the left lower portion of the abdomen.  You pass bloody or black tarry stools. MAKE SURE YOU:  Understand these instructions.  Will watch your condition.  Will get help right away if you are not doing well or get worse.   This information is not intended to replace advice given to you by your health care provider. Make sure you discuss any questions you have with your health care provider.   Document Released: 08/28/2005 Document Revised: 08/09/2015 Document Reviewed: 07/28/2013 Elsevier Interactive Patient Education 2016 [employer] Inc[employer]  Hyperglycemia Hyperglycemia occurs when the glucose (sugar) in your blood is too high. Hyperglycemia can happen for many reasons, but it most often happens to people who do not know they have diabetes or are not managing their diabetes properly.  CAUSES  Whether you have diabetes or not, there are other causes of hyperglycemia. Hyperglycemia can occur when you have diabetes, but it can also occur in other situations that you might not be as aware of, such as: Diabetes  If you have diabetes and are having problems controlling your blood glucose, hyperglycemia could occur because of some of the following reasons:  Not following your meal plan.  Not taking your diabetes medications or not taking it properly.  Exercising less or doing less activity than you normally do.  Being sick. Pre-diabetes  This cannot be ignored. Before people develop Type 2 diabetes, they almost always have "pre-diabetes." This is when your blood glucose levels are higher than normal, but not yet high enough to be diagnosed as diabetes. Research has shown that some long-term damage to the body, especially the heart and circulatory system, may already be occurring during pre-diabetes. If you take action to manage your blood glucose when you have  pre-diabetes, you may delay or prevent Type 2 diabetes from developing. Stress  If you have diabetes, you may be "diet" controlled or on oral medications or insulin to control your diabetes. However, you may find that your blood glucose is higher than usual in the hospital whether you have diabetes or not. This is often referred to as "stress hyperglycemia." Stress can elevate your blood glucose. This happens because of hormones put out by the body during times of stress. If stress has been the cause of your high blood glucose, it can be followed regularly by your caregiver. That way he/she can make sure your hyperglycemia does not continue to get worse or progress to diabetes. Steroids  Steroids are medications that act on the infection fighting system (immune system) to block inflammation or infection. One side effect can be a rise in blood glucose. Most people can produce enough extra insulin to allow for this rise, but for those who cannot, steroids make blood glucose levels go even higher. It is not unusual for steroid treatments to "uncover" diabetes that is developing. It is not always possible to determine if the hyperglycemia will go away after the steroids are stopped. A special blood test called an A1c is sometimes done to determine if your blood glucose was elevated before the steroids were started. SYMPTOMS  Thirsty.  Frequent urination.  Dry mouth.  Blurred vision.  Tired or fatigue.  Weakness.  Sleepy.  Tingling in feet or leg. DIAGNOSIS  Diagnosis is made by monitoring blood glucose in one or all of the following ways:  A1c test. This is a chemical found in your blood.  Fingerstick blood glucose monitoring.  Laboratory results. TREATMENT  First, knowing the cause of the hyperglycemia is important before the hyperglycemia can be treated. Treatment may include, but is not be limited to:  Education.  Change or adjustment in medications.  Change or adjustment in  meal plan.  Treatment for an illness, infection, etc.  More frequent blood glucose monitoring.  Change in exercise plan.  Decreasing or stopping steroids.  Lifestyle changes. HOME CARE INSTRUCTIONS   Test your blood glucose as directed.  Exercise regularly. Your caregiver will give you instructions about exercise. Pre-diabetes or diabetes which comes on with stress is helped by exercising.  Eat wholesome, balanced meals. Eat often and at regular, fixed times. Your caregiver or nutritionist will give you a meal plan to guide your sugar intake.  Being at an ideal weight is important. If needed, losing as little as 10 to 15 pounds may help improve blood glucose levels. SEEK MEDICAL CARE IF:   You have questions about medicine, activity, or diet.  You continue to have symptoms (problems such as increased thirst, urination, or weight gain). SEEK IMMEDIATE MEDICAL CARE IF:   You are vomiting or have diarrhea.  Your breath smells fruity.  You are breathing faster or slower.  You are very sleepy or incoherent.  You have numbness, tingling, or pain in your feet or hands.  You have chest pain.  Your symptoms get worse even though you have been  following your caregiver's orders.  If you have any other questions or concerns.   This information is not intended to replace advice given to you by your health care provider. Make sure you discuss any questions you have with your health care provider.   Document Released: 05/14/2001 Document Revised: 02/10/2012 Document Reviewed: 07/25/2015 Elsevier Interactive Patient Education 2016 [employer]. Diabetes Mellitus and Food It is important for you to manage your blood sugar (glucose) level. Your blood glucose level can be greatly affected by what you eat. Eating healthier foods in the appropriate amounts throughout the day at about the same time each day will help you control your blood glucose level. It can also help slow or prevent  worsening of your diabetes mellitus. Healthy eating may even help you improve the level of your blood pressure and reach or maintain a healthy weight.  General recommendations for healthful eating and cooking habits include:  Eating meals and snacks regularly. Avoid going long periods of time without eating to lose weight.  Eating a diet that consists mainly of plant-based foods, such as fruits, vegetables, nuts, legumes, and whole grains.  Using low-heat cooking methods, such as baking, instead of high-heat cooking methods, such as deep frying. Work with your dietitian to make sure you understand how to use the Nutrition Facts information on food labels. HOW CAN FOOD AFFECT ME? Carbohydrates Carbohydrates affect your blood glucose level more than any other type of food. Your dietitian will help you determine how many carbohydrates to eat at each meal and teach you how to count carbohydrates. Counting carbohydrates is important to keep your blood glucose at a healthy level, especially if you are using insulin or taking certain medicines for diabetes mellitus. Alcohol Alcohol can cause sudden decreases in blood glucose (hypoglycemia), especially if you use insulin or take certain medicines for diabetes mellitus. Hypoglycemia can be a life-threatening condition. Symptoms of hypoglycemia (sleepiness, dizziness, and disorientation) are similar to symptoms of having too much alcohol.  If your health care provider has given you approval to drink alcohol, do so in moderation and use the following guidelines:  Women should not have more than one drink per day, and men should not have more than two drinks per day. One drink is equal to:  12 oz of beer.  5 oz of wine.  1 oz of hard liquor.  Do not drink on an empty stomach.  Keep yourself hydrated. Have water, diet soda, or unsweetened iced tea.  Regular soda, juice, and other mixers might contain a lot of carbohydrates and should be  counted. WHAT FOODS ARE NOT RECOMMENDED? As you make food choices, it is important to remember that all foods are not the same. Some foods have fewer nutrients per serving than other foods, even though they might have the same number of calories or carbohydrates. It is difficult to get your body what it needs when you eat foods with fewer nutrients. Examples of foods that you should avoid that are high in calories and carbohydrates but low in nutrients include:  Trans fats (most processed foods list trans fats on the Nutrition Facts label).  Regular soda.  Juice.  Candy.  Sweets, such as cake, pie, doughnuts, and cookies.  Fried foods. WHAT FOODS CAN I EAT? Eat nutrient-rich foods, which will nourish your body and keep you healthy. The food you should eat also will depend on several factors, including:  The calories you need.  The medicines you take.  Your weight.  Your  blood glucose level.  Your blood pressure level.  Your cholesterol level. You should eat a variety of foods, including:  Protein.  Lean cuts of meat.  Proteins low in saturated fats, such as fish, egg whites, and beans. Avoid processed meats.  Fruits and vegetables.  Fruits and vegetables that may help control blood glucose levels, such as apples, mangoes, and yams.  Dairy products.  Choose fat-free or low-fat dairy products, such as milk, yogurt, and cheese.  Grains, bread, pasta, and rice.  Choose whole grain products, such as multigrain bread, whole oats, and brown rice. These foods may help control blood pressure.  Fats.  Foods containing healthful fats, such as nuts, avocado, olive oil, canola oil, and fish. DOES EVERYONE WITH DIABETES MELLITUS HAVE THE SAME MEAL PLAN? Because every person with diabetes mellitus is different, there is not one meal plan that works for everyone. It is very important that you meet with a dietitian who will help you create a meal plan that is just right for you.    This information is not intended to replace advice given to you by your health care provider. Make sure you discuss any questions you have with your health care provider.   Document Released: 08/15/2005 Document Revised: 12/09/2014 Document Reviewed: 10/15/2013 Elsevier Interactive Patient Education [employer] Elsevier Inc.

## 2016-04-16 NOTE — ED Notes (Signed)
Pt reports pain in ruq since last night.  Also reports n/v/d.

## 2016-04-17 LAB — URINE CULTURE

## 2016-05-16 ENCOUNTER — Emergency Department (HOSPITAL_COMMUNITY)
Admission: EM | Admit: 2016-05-16 | Discharge: 2016-05-16 | Disposition: A | Discharge status: Home / Self Care | Payer: Self-pay | Attending: Emergency Medicine | Admitting: Emergency Medicine

## 2016-05-16 ENCOUNTER — Encounter (HOSPITAL_COMMUNITY): Payer: Self-pay | Admitting: Emergency Medicine

## 2016-05-16 DIAGNOSIS — Z794 Long term (current) use of insulin: Secondary | ICD-10-CM | POA: Insufficient documentation

## 2016-05-16 DIAGNOSIS — Z7984 Long term (current) use of oral hypoglycemic drugs: Secondary | ICD-10-CM | POA: Insufficient documentation

## 2016-05-16 DIAGNOSIS — F17210 Nicotine dependence, cigarettes, uncomplicated: Secondary | ICD-10-CM | POA: Insufficient documentation

## 2016-05-16 DIAGNOSIS — E119 Type 2 diabetes mellitus without complications: Secondary | ICD-10-CM | POA: Insufficient documentation

## 2016-05-16 DIAGNOSIS — R110 Nausea: Secondary | ICD-10-CM | POA: Insufficient documentation

## 2016-05-16 DIAGNOSIS — L309 Dermatitis, unspecified: Secondary | ICD-10-CM | POA: Insufficient documentation

## 2016-05-16 MED ORDER — TRIAMCINOLONE ACETONIDE 0.5 % EX OINT: 1.0000 "application " | TOPICAL_OINTMENT | Freq: Two times a day (BID) | CUTANEOUS | Status: DC

## 2016-05-16 MED ORDER — DIPHENHYDRAMINE HCL 25 MG PO TABS: 25.0000 mg | ORAL_TABLET | Freq: Four times a day (QID) | ORAL | Status: DC

## 2016-05-16 MED ORDER — DEXAMETHASONE SODIUM PHOSPHATE 4 MG/ML IJ SOLN
8.0000 mg | Freq: Once | INTRAMUSCULAR | Status: AC
  Administered 2016-05-16: 8 mg via INTRAMUSCULAR
  Filled 2016-05-16: qty 2

## 2016-05-16 NOTE — ED Notes (Signed)
Patient with rash to bilateral forearms. +itching. Also c/o nausea that started yesterday. No vomiting. No abdominal pain. LBM was yesterday and loose, but only 1 episode.

## 2016-05-16 NOTE — ED Provider Notes (Signed)
CSN: [id number]     Arrival date & time 05/16/16  0751 History   First MD Initiated Contact with Patient 05/16/16 [phone]     Chief Complaint  Patient presents with  . Rash     (Consider location/radiation/quality/duration/timing/severity/associated sxs/prior Treatment) Patient is a 35 y.o. female presenting with rash. The history is provided by the patient.  Rash Location:  Shoulder/arm Shoulder/arm rash location:  L arm and R arm Quality: dryness, itchiness and scaling   Severity:  Moderate Onset quality:  Gradual Duration:  2 days Timing:  Constant Progression:  Worsening Chronicity:  New Context: not animal contact, not exposure to similar rash, not insect bite/sting, not medications, not new detergent/soap and not plant contact   Relieved by:  Nothing Worsened by:  Nothing tried Associated symptoms: nausea   Associated symptoms: no fever, no shortness of breath and not wheezing     Past Medical History  Diagnosis Date  . Diabetes mellitus without complication (HCC)   . Macular degeneration   . Fatty liver disease, nonalcoholic    History reviewed. No pertinent past surgical history. No family history on file. Social History  Substance Use Topics  . Smoking status: Current Every Day Smoker -- 0.50 packs/day    Types: Cigarettes  . Smokeless tobacco: None  . Alcohol Use: No   OB History    No data available     Review of Systems  Constitutional: Negative for fever, activity change and appetite change.  Respiratory: Negative for shortness of breath, wheezing and stridor.   Gastrointestinal: Positive for nausea.  Skin: Positive for rash.  All other systems reviewed and are negative.     Allergies  Shellfish allergy and Percocet  Home Medications   Prior to Admission medications   Medication Sig Start Date End Date Taking? Authorizing Provider  bismuth subsalicylate (PEPTO BISMOL) 262 MG/15ML suspension Take 30 mLs by mouth every 6 (six) hours as needed.     Historical Provider, MD  cephALEXin (KEFLEX) 500 MG capsule Take 1 capsule (500 mg total) by mouth 4 (four) times daily. For 7 days Patient not taking: Reported on 04/16/2016 03/19/16   Tammy Triplett, PA-C  diphenhydrAMINE (BENADRYL) 25 MG tablet Take 25 mg by mouth every 6 (six) hours as needed for allergies.    Historical Provider, MD  HYDROcodone-acetaminophen (NORCO) 5-325 MG tablet Take 1-2 tablets by mouth every 4 (four) hours as needed. 04/16/16   Mancel BaleElliott Wentz, MD  insulin aspart (NOVOLOG) 100 UNIT/ML injection Inject 5 Units into the skin 3 (three) times daily before meals. Reported on 04/16/2016    Historical Provider, MD  insulin detemir (LEVEMIR) 100 UNIT/ML injection Inject 25 Units into the skin 2 (two) times daily. Reported on 04/16/2016    Historical Provider, MD  metFORMIN (GLUCOPHAGE) 500 MG tablet Take 1 tablet (500 mg total) by mouth 2 (two) times daily with a meal. 09/08/15   Vanetta MuldersScott Zackowski, MD  phenazopyridine (PYRIDIUM) 200 MG tablet Take 1 tablet (200 mg total) by mouth 3 (three) times daily. 03/19/16   Tammy Triplett, PA-C  tetrahydrozoline-zinc (VISINE-AC) 0.05-0.25 % ophthalmic solution Place 2 drops into both eyes 3 (three) times daily as needed.    Historical Provider, MD   BP 117/72 mmHg  Pulse 81  Temp(Src) 98 F (36.7 C) (Oral)  Ht 5\' 7"  (1.702 m)  Wt 131.543 kg  BMI 45.41 kg/m2  SpO2 99%  LMP 05/13/2016 Physical Exam  Constitutional: She is oriented to person, place, and time. She appears well-developed and  well-nourished.  Non-toxic appearance.  HENT:  Head: Normocephalic.  Right Ear: Tympanic membrane and external ear normal.  Left Ear: Tympanic membrane and external ear normal.  Eyes: EOM and lids are normal. Pupils are equal, round, and reactive to light.  Neck: Normal range of motion. Neck supple. Carotid bruit is not present.  Cardiovascular: Normal rate, regular rhythm, normal heart sounds, intact distal pulses and normal pulses.   Pulmonary/Chest:  Breath sounds normal. No respiratory distress.  Abdominal: Soft. Bowel sounds are normal. There is no tenderness. There is no guarding.  Musculoskeletal: Normal range of motion.  Lymphadenopathy:       Head (right side): No submandibular adenopathy present.       Head (left side): No submandibular adenopathy present.    She has no cervical adenopathy.  Neurological: She is alert and oriented to person, place, and time. She has normal strength. No cranial nerve deficit or sensory deficit.  Skin: Skin is warm and dry. Rash noted.  Dry scaling rash of the upper extremities. Antecubital area involved. Some rash on the face. No rash of the oral pharynx or plantar surface or palmar surface.  Psychiatric: She has a normal mood and affect. Her speech is normal.  Nursing note and vitals reviewed.   ED Course  Procedures (including critical care time) Labs Review Labs Reviewed - No data to display  Imaging Review No results found. I have personally reviewed and evaluated these images and lab results as part of my medical decision-making.   EKG Interpretation None      MDM  Vital signs stable. No exposure to new soap, medication, food or environment. Suspect eczema rash. Rx for triamcinolone and benadryl given to the patient. Pt treated with IM decadron in ED. She is in agreement with this plan.   Final diagnoses:  None    *I have reviewed nursing notes, vital signs, and all appropriate lab and imaging results for this patient.[address] PA-C 05/16/16 1610  Raeford Razor, MD 05/28/16 [phone]

## 2016-05-16 NOTE — Discharge Instructions (Signed)
Please apply triamcinolone to all rash areas (except on your face) two times daily. Use benadryl every 6 hours for itching. This may cause drowsiness, use with caution. You were treated with a steroid shot in the ED. [facility] for additional follow  Up if not improving.

## 2016-05-20 ENCOUNTER — Encounter (HOSPITAL_COMMUNITY): Payer: Self-pay | Admitting: Emergency Medicine

## 2016-05-20 ENCOUNTER — Emergency Department (HOSPITAL_COMMUNITY)
Admission: EM | Admit: 2016-05-20 | Discharge: 2016-05-20 | Disposition: A | Discharge status: Home / Self Care | Payer: Self-pay | Attending: Emergency Medicine | Admitting: Emergency Medicine

## 2016-05-20 DIAGNOSIS — F17210 Nicotine dependence, cigarettes, uncomplicated: Secondary | ICD-10-CM | POA: Insufficient documentation

## 2016-05-20 DIAGNOSIS — R739 Hyperglycemia, unspecified: Secondary | ICD-10-CM

## 2016-05-20 DIAGNOSIS — R110 Nausea: Secondary | ICD-10-CM | POA: Insufficient documentation

## 2016-05-20 DIAGNOSIS — E669 Obesity, unspecified: Secondary | ICD-10-CM | POA: Insufficient documentation

## 2016-05-20 DIAGNOSIS — Z794 Long term (current) use of insulin: Secondary | ICD-10-CM | POA: Insufficient documentation

## 2016-05-20 DIAGNOSIS — E1165 Type 2 diabetes mellitus with hyperglycemia: Secondary | ICD-10-CM | POA: Insufficient documentation

## 2016-05-20 DIAGNOSIS — Z6841 Body Mass Index (BMI) 40.0 and over, adult: Secondary | ICD-10-CM | POA: Insufficient documentation

## 2016-05-20 DIAGNOSIS — Z7984 Long term (current) use of oral hypoglycemic drugs: Secondary | ICD-10-CM | POA: Insufficient documentation

## 2016-05-20 LAB — CBC WITH DIFFERENTIAL/PLATELET
BASOS PCT: 0 %
Basophils Absolute: 0 10*3/uL (ref 0.0–0.1)
EOS ABS: 0.1 10*3/uL (ref 0.0–0.7)
EOS PCT: 1 %
HCT: 43.2 % (ref 36.0–46.0)
Hemoglobin: 14.2 g/dL (ref 12.0–15.0)
Lymphocytes Relative: 69 %
Lymphs Abs: 5.7 10*3/uL — ABNORMAL HIGH (ref 0.7–4.0)
MCH: 26.9 pg (ref 26.0–34.0)
MCHC: 32.9 g/dL (ref 30.0–36.0)
MCV: 81.8 fL (ref 78.0–100.0)
MONO ABS: 0.3 10*3/uL (ref 0.1–1.0)
MONOS PCT: 4 %
Neutro Abs: 2.1 10*3/uL (ref 1.7–7.7)
Neutrophils Relative %: 26 %
PLATELETS: 217 10*3/uL (ref 150–400)
RBC: 5.28 MIL/uL — ABNORMAL HIGH (ref 3.87–5.11)
RDW: 14.2 % (ref 11.5–15.5)
WBC: 8.2 10*3/uL (ref 4.0–10.5)

## 2016-05-20 LAB — BASIC METABOLIC PANEL
Anion gap: 4 — ABNORMAL LOW (ref 5–15)
BUN: 12 mg/dL (ref 6–20)
CALCIUM: 8.3 mg/dL — AB (ref 8.9–10.3)
CO2: 23 mmol/L (ref 22–32)
CREATININE: 0.73 mg/dL (ref 0.44–1.00)
Chloride: 108 mmol/L (ref 101–111)
GFR calc Af Amer: >60 mL/min (ref >60)
GLUCOSE: 208 mg/dL — AB (ref 65–99)
Potassium: 4.1 mmol/L (ref 3.5–5.1)
Sodium: 135 mmol/L (ref 135–145)

## 2016-05-20 LAB — CBG MONITORING, ED: GLUCOSE-CAPILLARY: 210 mg/dL — AB (ref 65–99)

## 2016-05-20 NOTE — ED Notes (Signed)
Rachel George at bedside. 

## 2016-05-20 NOTE — Discharge Instructions (Signed)
Hyperglycemia °High blood sugar (hyperglycemia) means that the level of sugar in your blood is higher than it should be. Signs of high blood sugar include: °· Feeling thirsty. °· Frequent peeing (urinating). °· Feeling tired or sleepy. °· Dry mouth. °· Vision changes. °· Feeling weak. °· Feeling hungry but losing weight. °· Numbness and tingling in your hands or feet. °· Headache. °When you ignore these signs, your blood sugar may keep going up. These problems may get worse, and other problems may begin. °HOME CARE °· Check your blood sugars as told by your doctor. Write down the numbers with the date and time. °· Take the right amount of insulin or diabetes pills at the right time. Write down the dose with date and time. °· Refill your insulin or diabetes pills before running out. °· Watch what you eat. Follow your meal plan. °· Drink liquids without sugar, such as water. Check with your doctor if you have kidney or heart disease. °· Follow your doctor's orders for exercise. Exercise at the same time of day. °· Keep your doctor's appointments. °GET HELP RIGHT AWAY IF:  °· You have trouble thinking or are confused. °· You have fast breathing with fruity smelling breath. °· You pass out (faint). °· You have 2 to 3 days of high blood sugars and you do not know why. °· You have chest pain. °· You are feeling sick to your stomach (nauseous) or throwing up (vomiting). °· You have sudden vision changes. °MAKE SURE YOU:  °· Understand these instructions. °· Will watch your condition. °· Will get help right away if you are not doing well or get worse. °  °This information is not intended to replace advice given to you by your health care provider. Make sure you discuss any questions you have with your health care provider. °  °Document Released: 09/15/2009 Document Revised: 12/09/2014 Document Reviewed: 07/25/2015 °Elsevier Interactive Patient Education ©2016 Elsevier Inc. ° °

## 2016-05-20 NOTE — ED Provider Notes (Signed)
CSN: [id number]     Arrival date & time 05/20/16  1320 History  By signing my name below, I, Iona BeardChristian Pulliam, attest that this documentation has been prepared under the direction and in the presence of Venancio Chenier, PA-C.   Electronically Signed: Iona Beardhristian Pulliam, ED Scribe 05/20/2016 at 2:27 PM.  Chief Complaint  Patient presents with  . Hyperglycemia    Patient is a 35 y.o. female presenting with hyperglycemia. The history is provided by the patient. No language interpreter was used.  Hyperglycemia Blood sugar level PTA:  210 Severity:  Moderate Onset quality:  Gradual Duration:  4 days Timing:  Constant Progression:  Unable to specify Chronicity:  Chronic Diabetes status:  Controlled with oral medications Current diabetic therapy:  Metformin Context: not change in medication, not insulin pump use, not new diabetes diagnosis, not noncompliance, not recent change in diet and not recent illness   Context comment:  Recent steroid injection Relieved by:  Nothing Ineffective treatments:  Oral agents Associated symptoms: fatigue and nausea   Associated symptoms: no abdominal pain, no chest pain and no shortness of breath    HPI Comments: Rachel BarkerKaren Droessler is a 35 y.o. female with PMHx of DM who presents to the Emergency Department complaining of gradual onset, hyperglycemia, ongoing for about four days. Pt reports associated fatigue, loss of appetite, nausea, and malaise. Pt was seen on 05/16/2016 for a rash and given a steroid injection. She states "her sugars have been high since receiving the shot". Pt takes metformin but cannot afford to pay for insulin. No other associated symptoms noted. No worsening or alleviating factors noted. Pt denies chest pain, shortness of breath, abdominal pain, vomiting or any other pertinent symptoms.   Past Medical History  Diagnosis Date  . Diabetes mellitus without complication (HCC)   . Macular degeneration   . Fatty liver disease, nonalcoholic     History reviewed. No pertinent past surgical history. History reviewed. No pertinent family history. Social History  Substance Use Topics  . Smoking status: Current Every Day Smoker -- 0.50 packs/day    Types: Cigarettes  . Smokeless tobacco: None  . Alcohol Use: No   OB History    No data available     Review of Systems  Constitutional: Positive for appetite change and fatigue.       Malaise.  Respiratory: Negative for shortness of breath.   Cardiovascular: Negative for chest pain.  Gastrointestinal: Positive for nausea. Negative for abdominal pain.     Allergies  Shellfish allergy and Percocet  Home Medications   Prior to Admission medications   Medication Sig Start Date End Date Taking? Authorizing Provider  bismuth subsalicylate (PEPTO BISMOL) 262 MG/15ML suspension Take 30 mLs by mouth every 6 (six) hours as needed.    Historical Provider, MD  cephALEXin (KEFLEX) 500 MG capsule Take 1 capsule (500 mg total) by mouth 4 (four) times daily. For 7 days Patient not taking: Reported on 04/16/2016 03/19/16   Bethann Qualley, PA-C  diphenhydrAMINE (BENADRYL) 25 MG tablet Take 1 tablet (25 mg total) by mouth every 6 (six) hours. 05/16/16   Ivery QualeHobson Bryant, PA-C  HYDROcodone-acetaminophen (NORCO) 5-325 MG tablet Take 1-2 tablets by mouth every 4 (four) hours as needed. 04/16/16   Mancel BaleElliott Wentz, MD  insulin aspart (NOVOLOG) 100 UNIT/ML injection Inject 5 Units into the skin 3 (three) times daily before meals. Reported on 04/16/2016    Historical Provider, MD  insulin detemir (LEVEMIR) 100 UNIT/ML injection Inject 25 Units into the skin 2 (  two) times daily. Reported on 04/16/2016    Historical Provider, MD  metFORMIN (GLUCOPHAGE) 500 MG tablet Take 1 tablet (500 mg total) by mouth 2 (two) times daily with a meal. 09/08/15   Vanetta Mulders, MD  phenazopyridine (PYRIDIUM) 200 MG tablet Take 1 tablet (200 mg total) by mouth 3 (three) times daily. 03/19/16   Sienna Stonehocker, PA-C   tetrahydrozoline-zinc (VISINE-AC) 0.05-0.25 % ophthalmic solution Place 2 drops into both eyes 3 (three) times daily as needed.    Historical Provider, MD  triamcinolone ointment (KENALOG) 0.5 % Apply 1 application topically 2 (two) times daily. 05/16/16   Ivery Quale, PA-C   BP 129/73 mmHg  Pulse 87  Temp(Src) 98.7 F (37.1 C)  Resp 18  Ht  (1.702 m)  Wt 290 lb (131.543 kg)  BMI 45.41 kg/m2  SpO2 99%  LMP 05/13/2016 Physical Exam  Constitutional: She appears well-developed and well-nourished. No distress.  Obese.  HENT:  Head: Normocephalic and atraumatic.  Mouth/Throat: Oropharynx is clear and moist.  Eyes: Conjunctivae and EOM are normal.  Neck: Neck supple. No tracheal deviation present.  Cardiovascular: Normal rate, regular rhythm and normal heart sounds.   No murmur heard. Pulmonary/Chest: Effort normal. No respiratory distress.  Abdominal: Soft. She exhibits no distension. There is no tenderness. There is no rebound and no guarding.  Musculoskeletal: Normal range of motion.  Lymphadenopathy:    She has no cervical adenopathy.  Neurological: She is alert. Coordination normal.  Skin: Skin is warm and dry.  Psychiatric: She has a normal mood and affect. Her behavior is normal.  Nursing note and vitals reviewed.   ED Course  Procedures (including critical care time) DIAGNOSTIC STUDIES: Oxygen Saturation is 99% on RA, normal by my interpretation.    COORDINATION OF CARE: 2:44 PM Discussed treatment plan which includes BMP and CBC with differential with pt at bedside and pt agreed to plan.  3:33 PM Caseworker spoke with patient and is trying to find resources for affordable insulin medication. Patient has a follow up appointment with the health department in two weeks.    Labs Review Labs Reviewed  BASIC METABOLIC PANEL - Abnormal; Notable for the following:    Glucose, Bld 208 (*)    Calcium 8.3 (*)    Anion gap 4 (*)    All other components within normal  limits  CBC WITH DIFFERENTIAL/PLATELET - Abnormal; Notable for the following:    RBC 5.28 (*)    Lymphs Abs 5.7 (*)    All other components within normal limits  CBG MONITORING, ED - Abnormal; Notable for the following:    Glucose-Capillary 210 (*)    All other components within normal limits   Imaging Review No results found. I have personally reviewed and evaluated these lab results as part of my medical decision-making.   EKG Interpretation None      MDM   Final diagnoses:  Hyperglycemia   Pt well appearing.  No concerning sx's for metabolic acidosis.  Vitals stable.  CBG 208. A gap wnml.  Pt currently taking metformin.  Social worker spoke with patient regarding possibility of financial assistance for insulin.  She has appt with health dept in 2 weeks.  Appears stable for d/c, agrees to continue metformin.       I personally performed the services described in this documentation, which was scribed in my presence. The recorded information has been reviewed and is accurate.    Rachel Aus, PA-C 05/20/16 2253  Zadie Rhine, MD  05/21/16 0740 

## 2016-05-20 NOTE — ED Notes (Signed)
Pt denying any symptoms at this time.

## 2016-05-20 NOTE — ED Notes (Signed)
Patient states she was given steroid shot here last week and her sugar has been high ever since. States "I don't have the money for my insulin, I just take my metformin." Patient's CBG - 210 at triage.

## 2016-06-04 ENCOUNTER — Emergency Department (HOSPITAL_COMMUNITY)
Admission: EM | Admit: 2016-06-04 | Discharge: 2016-06-04 | Disposition: A | Discharge status: Home / Self Care | Payer: Self-pay | Attending: Emergency Medicine | Admitting: Emergency Medicine

## 2016-06-04 ENCOUNTER — Encounter (HOSPITAL_COMMUNITY): Payer: Self-pay | Admitting: Emergency Medicine

## 2016-06-04 ENCOUNTER — Emergency Department (HOSPITAL_COMMUNITY): Payer: Self-pay

## 2016-06-04 DIAGNOSIS — Z7984 Long term (current) use of oral hypoglycemic drugs: Secondary | ICD-10-CM | POA: Insufficient documentation

## 2016-06-04 DIAGNOSIS — E119 Type 2 diabetes mellitus without complications: Secondary | ICD-10-CM | POA: Insufficient documentation

## 2016-06-04 DIAGNOSIS — J40 Bronchitis, not specified as acute or chronic: Secondary | ICD-10-CM | POA: Insufficient documentation

## 2016-06-04 DIAGNOSIS — Z79899 Other long term (current) drug therapy: Secondary | ICD-10-CM | POA: Insufficient documentation

## 2016-06-04 DIAGNOSIS — Z794 Long term (current) use of insulin: Secondary | ICD-10-CM | POA: Insufficient documentation

## 2016-06-04 DIAGNOSIS — F17210 Nicotine dependence, cigarettes, uncomplicated: Secondary | ICD-10-CM | POA: Insufficient documentation

## 2016-06-04 DIAGNOSIS — R0981 Nasal congestion: Secondary | ICD-10-CM | POA: Insufficient documentation

## 2016-06-04 MED ORDER — AZITHROMYCIN 250 MG PO TABS: ORAL_TABLET | ORAL | Status: DC

## 2016-06-04 MED ORDER — AZITHROMYCIN 250 MG PO TABS
500.0000 mg | ORAL_TABLET | Freq: Once | ORAL | Status: AC
  Administered 2016-06-04: 500 mg via ORAL
  Filled 2016-06-04: qty 2

## 2016-06-04 MED ORDER — ONDANSETRON HCL 4 MG PO TABS
4.0000 mg | ORAL_TABLET | Freq: Once | ORAL | Status: AC
  Administered 2016-06-04: 4 mg via ORAL
  Filled 2016-06-04: qty 1

## 2016-06-04 MED ORDER — IPRATROPIUM-ALBUTEROL 0.5-2.5 (3) MG/3ML IN SOLN
3.0000 mL | Freq: Once | RESPIRATORY_TRACT | Status: AC
  Administered 2016-06-04: 3 mL via RESPIRATORY_TRACT
  Filled 2016-06-04: qty 3

## 2016-06-04 MED ORDER — ALBUTEROL SULFATE HFA 108 (90 BASE) MCG/ACT IN AERS
2.0000 | INHALATION_SPRAY | Freq: Once | RESPIRATORY_TRACT | Status: AC
Start: 2016-06-04 — End: 2016-06-04
  Administered 2016-06-04: 2 via RESPIRATORY_TRACT
  Filled 2016-06-04: qty 6.7

## 2016-06-04 MED ORDER — PREDNISONE 20 MG PO TABS
40.0000 mg | ORAL_TABLET | Freq: Once | ORAL | Status: AC
  Administered 2016-06-04: 40 mg via ORAL
  Filled 2016-06-04: qty 2

## 2016-06-04 MED ORDER — CEPHALEXIN 500 MG PO CAPS
500.0000 mg | ORAL_CAPSULE | Freq: Once | ORAL | Status: AC
  Administered 2016-06-04: 500 mg via ORAL
  Filled 2016-06-04: qty 1

## 2016-06-04 MED ORDER — DEXAMETHASONE 4 MG PO TABS: 4.0000 mg | ORAL_TABLET | Freq: Two times a day (BID) | ORAL | Status: DC

## 2016-06-04 MED ORDER — ALBUTEROL SULFATE (2.5 MG/3ML) 0.083% IN NEBU
2.5000 mg | INHALATION_SOLUTION | Freq: Once | RESPIRATORY_TRACT | Status: AC
  Administered 2016-06-04: 2.5 mg via RESPIRATORY_TRACT
  Filled 2016-06-04: qty 3

## 2016-06-04 NOTE — ED Notes (Signed)
Pt reports cough and congestion x3 days coughing up yellow sputum with streaks of blood. Pt alert and oriented.

## 2016-06-04 NOTE — Progress Notes (Signed)
Increased BBS with a very faint scattered expiratory wheeze noted.

## 2016-06-04 NOTE — ED Provider Notes (Signed)
CSN: [id number]     Arrival date & time 06/04/16  [id number] History   First MD Initiated Contact with Patient 06/04/16 1013     Chief Complaint  Patient presents with  . Cough     (Consider location/radiation/quality/duration/timing/severity/associated sxs/prior Treatment) Patient is a 35 y.o. female presenting with cough. The history is provided by the patient.  Cough Cough characteristics:  Productive Sputum characteristics:  Yellow Severity:  Moderate Onset quality:  Gradual Duration:  3 days Timing:  Intermittent Progression:  Worsening Chronicity:  New Context: sick contacts and upper respiratory infection   Relieved by:  Nothing Worsened by:  Nothing tried Associated symptoms: sinus congestion and wheezing   Associated symptoms: no chills and no fever     Past Medical History  Diagnosis Date  . Diabetes mellitus without complication (HCC)   . Macular degeneration   . Fatty liver disease, nonalcoholic    History reviewed. No pertinent past surgical history. History reviewed. No pertinent family history. Social History  Substance Use Topics  . Smoking status: Current Every Day Smoker -- 0.50 packs/day    Types: Cigarettes  . Smokeless tobacco: None  . Alcohol Use: No   OB History    No data available     Review of Systems  Constitutional: Negative for fever and chills.  HENT: Positive for congestion.   Respiratory: Positive for cough and wheezing.   All other systems reviewed and are negative.     Allergies  Shellfish allergy and Percocet  Home Medications   Prior to Admission medications   Medication Sig Start Date End Date Taking? Authorizing Provider  bismuth subsalicylate (PEPTO BISMOL) 262 MG/15ML suspension Take 30 mLs by mouth every 6 (six) hours as needed.   Yes Historical Provider, MD  diphenhydrAMINE (BENADRYL) 25 MG tablet Take 1 tablet (25 mg total) by mouth every 6 (six) hours. 05/16/16  Yes Ivery QualeHobson Jenie Parish, PA-C  insulin aspart (NOVOLOG) 100  UNIT/ML injection Inject 5 Units into the skin 3 (three) times daily before meals. Reported on 04/16/2016   Yes Historical Provider, MD  insulin detemir (LEVEMIR) 100 UNIT/ML injection Inject 25 Units into the skin 2 (two) times daily. Reported on 04/16/2016   Yes Historical Provider, MD  metFORMIN (GLUCOPHAGE) 500 MG tablet Take 1 tablet (500 mg total) by mouth 2 (two) times daily with a meal. 09/08/15  Yes Vanetta MuldersScott Zackowski, MD  tetrahydrozoline-zinc (VISINE-AC) 0.05-0.25 % ophthalmic solution Place 2 drops into both eyes 3 (three) times daily as needed.   Yes Historical Provider, MD  triamcinolone ointment (KENALOG) 0.5 % Apply 1 application topically 2 (two) times daily. 05/16/16  Yes Ivery QualeHobson Lula Michaux, PA-C  azithromycin (ZITHROMAX) 250 MG tablet Take1 every day until finished. Start 7/5. 06/04/16   Ivery QualeHobson Marolyn Urschel, PA-C  dexamethasone (DECADRON) 4 MG tablet Take 1 tablet (4 mg total) by mouth 2 (two) times daily with a meal. 06/04/16   Ivery QualeHobson Belva Koziel, PA-C  phenazopyridine (PYRIDIUM) 200 MG tablet Take 1 tablet (200 mg total) by mouth 3 (three) times daily. Patient not taking: Reported on 06/04/2016 03/19/16   Tammy Triplett, PA-C   BP 172/94 mmHg  Pulse 95  Temp(Src) 98.7 F (37.1 C) (Oral)  Resp 18  Ht 5\' 7"  (1.702 m)  Wt 131.543 kg  BMI 45.41 kg/m2  SpO2 100%  LMP 05/13/2016 Physical Exam  Constitutional: She is oriented to person, place, and time. She appears well-developed and well-nourished.  Non-toxic appearance.  HENT:  Head: Normocephalic.  Right Ear: Tympanic membrane and external ear  normal.  Left Ear: Tympanic membrane and external ear normal.  Eyes: EOM and lids are normal. Pupils are equal, round, and reactive to light.  Neck: Normal range of motion. Neck supple. Carotid bruit is not present.  Cardiovascular: Normal rate, regular rhythm, normal heart sounds, intact distal pulses and normal pulses.   Pulmonary/Chest: No respiratory distress. She has wheezes. She has rhonchi.  Abdominal:  Soft. Bowel sounds are normal. There is no tenderness. There is no guarding.  Musculoskeletal: Normal range of motion.  Lymphadenopathy:       Head (right side): No submandibular adenopathy present.       Head (left side): No submandibular adenopathy present.    She has no cervical adenopathy.  Neurological: She is alert and oriented to person, place, and time. She has normal strength. No cranial nerve deficit or sensory deficit.  Skin: Skin is warm and dry.  Psychiatric: She has a normal mood and affect. Her speech is normal.  Nursing note and vitals reviewed.   ED Course  Procedures (including critical care time) Labs Review Labs Reviewed - No data to display  Imaging Review Dg Chest 2 View  06/04/2016  CLINICAL DATA:  Cough, congestion for 3 days. EXAM: CHEST  2 VIEW COMPARISON:  09/08/2015 FINDINGS: Heart and mediastinal contours are within normal limits. No focal opacities or effusions. No acute bony abnormality. IMPRESSION: No active cardiopulmonary disease. Electronically Signed   By: Charlett NoseKevin  Dover M.D.   On: 06/04/2016 10:42   I have personally reviewed and evaluated these images and lab results as part of my medical decision-making.   EKG Interpretation None      MDM  Chest x-ray is negative for acute changes. The examination favors acute bronchitis. Patient will be treated with albuterol, Decadron, and Zithromax. Patient will follow with her primary physician if not improving.    Final diagnoses:  Bronchitis    **I have reviewed nursing notes, vital signs, and all appropriate lab and imaging results for this patient.    Ivery QualeHobson Jordann Grime, PA-C 06/04/16 1440  Lavera Guiseana Duo Liu, MD 06/04/16 [phone]

## 2016-06-04 NOTE — Discharge Instructions (Signed)
Your x-ray is negative for pneumonia, mass, or fluid. Please increase liquids. Use Afrin spray in each nostril for 5 days only for nasal congestion. Use Zithromax daily until all taken. Use Decadron 2 times daily with a meal until all taken. Use 2 puffs of albuterol every 4 hours.

## 2016-07-11 ENCOUNTER — Emergency Department (HOSPITAL_COMMUNITY)
Admission: EM | Admit: 2016-07-11 | Discharge: 2016-07-11 | Disposition: A | Discharge status: Home / Self Care | Payer: Self-pay | Attending: Emergency Medicine | Admitting: Emergency Medicine

## 2016-07-11 ENCOUNTER — Encounter (HOSPITAL_COMMUNITY): Payer: Self-pay | Admitting: Emergency Medicine

## 2016-07-11 DIAGNOSIS — Z79899 Other long term (current) drug therapy: Secondary | ICD-10-CM | POA: Insufficient documentation

## 2016-07-11 DIAGNOSIS — R197 Diarrhea, unspecified: Secondary | ICD-10-CM | POA: Insufficient documentation

## 2016-07-11 DIAGNOSIS — N390 Urinary tract infection, site not specified: Secondary | ICD-10-CM | POA: Insufficient documentation

## 2016-07-11 DIAGNOSIS — R739 Hyperglycemia, unspecified: Secondary | ICD-10-CM

## 2016-07-11 DIAGNOSIS — E1165 Type 2 diabetes mellitus with hyperglycemia: Secondary | ICD-10-CM | POA: Insufficient documentation

## 2016-07-11 DIAGNOSIS — Z794 Long term (current) use of insulin: Secondary | ICD-10-CM | POA: Insufficient documentation

## 2016-07-11 DIAGNOSIS — Z7984 Long term (current) use of oral hypoglycemic drugs: Secondary | ICD-10-CM | POA: Insufficient documentation

## 2016-07-11 DIAGNOSIS — F17210 Nicotine dependence, cigarettes, uncomplicated: Secondary | ICD-10-CM | POA: Insufficient documentation

## 2016-07-11 DIAGNOSIS — R1110 Vomiting, unspecified: Secondary | ICD-10-CM

## 2016-07-11 LAB — URINE MICROSCOPIC-ADD ON

## 2016-07-11 LAB — URINALYSIS, ROUTINE W REFLEX MICROSCOPIC
Bilirubin Urine: NEGATIVE
KETONES UR: NEGATIVE mg/dL
LEUKOCYTES UA: NEGATIVE
Nitrite: POSITIVE — AB
Specific Gravity, Urine: 1.02 (ref 1.005–1.030)
pH: 5.5 (ref 5.0–8.0)

## 2016-07-11 LAB — I-STAT CHEM 8, ED
BUN: 8 mg/dL (ref 6–20)
CHLORIDE: 102 mmol/L (ref 101–111)
Calcium, Ion: 1.17 mmol/L (ref 1.13–1.30)
Creatinine, Ser: 0.7 mg/dL (ref 0.44–1.00)
Glucose, Bld: 334 mg/dL — ABNORMAL HIGH (ref 65–99)
HEMATOCRIT: 44 % (ref 36.0–46.0)
Hemoglobin: 15 g/dL (ref 12.0–15.0)
Potassium: 4 mmol/L (ref 3.5–5.1)
SODIUM: 139 mmol/L (ref 135–145)
TCO2: 23 mmol/L (ref 0–100)

## 2016-07-11 LAB — CBG MONITORING, ED: GLUCOSE-CAPILLARY: 367 mg/dL — AB (ref 65–99)

## 2016-07-11 LAB — POC URINE PREG, ED: PREG TEST UR: NEGATIVE

## 2016-07-11 MED ORDER — ONDANSETRON HCL 4 MG PO TABS: 4.0000 mg | ORAL_TABLET | Freq: Four times a day (QID) | ORAL | 0 refills | Status: DC | PRN

## 2016-07-11 MED ORDER — ONDANSETRON 4 MG PO TBDP
4.0000 mg | ORAL_TABLET | Freq: Once | ORAL | Status: AC
  Administered 2016-07-11: 4 mg via ORAL
  Filled 2016-07-11: qty 1

## 2016-07-11 MED ORDER — CEPHALEXIN 250 MG PO CAPS: 250.0000 mg | ORAL_CAPSULE | Freq: Four times a day (QID) | ORAL | 0 refills | Status: DC

## 2016-07-11 NOTE — ED Provider Notes (Signed)
[facility]-EMERGENCY DEPT Provider Note   CSN: [id number] Arrival date & time: 07/11/16  1253  First Provider Contact:  First MD Initiated Contact with Patient 07/11/16 1313        History   Chief Complaint Chief Complaint  Patient presents with  . Nausea  . Emesis    times one    HPI Rachel George is a 35 y.o. female.  HPI Patient presents with one day of vomiting and diarrhea. She's had one episode of vomiting and 4 episodes of watery stool. No blood in either. No fever or chills. Patient has had some fatigue. 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 No other known sick contacts. No recent travel. No suspicious food consumption. Denies any abdominal pain currently. She has episodic cramping and gurgling in the abdomen. States her blood sugars have been in the 200 range recently. She also complains of strong urine and dysuria for the past few days. She states she gets frequent UTIs. Past Medical History:  Diagnosis Date  . Diabetes mellitus without complication (HCC)   . Fatty liver disease, nonalcoholic   . Macular degeneration     There are no active problems to display for this patient.   No past surgical history on file.  OB History    No data available       Home Medications    Prior to Admission medications   Medication Sig Start Date End Date Taking? Authorizing Provider  albuterol (PROVENTIL HFA;VENTOLIN HFA) 108 (90 Base) MCG/ACT inhaler Inhale 2 puffs into the lungs every 6 (six) hours as needed for wheezing or shortness of breath.   Yes Historical Provider, MD  bismuth subsalicylate (PEPTO BISMOL) 262 MG/15ML suspension Take 30 mLs by mouth every 6 (six) hours as needed.   Yes Historical Provider, MD  diphenhydrAMINE (BENADRYL) 25 MG tablet Take 1 tablet (25 mg total) by mouth every 6 (six) hours. 05/16/16  Yes Ivery Quale, PA-C  insulin aspart (NOVOLOG) 100 UNIT/ML injection Inject 5 Units into the skin 3 (three) times daily before meals. Reported on  04/16/2016   Yes Historical Provider, MD  insulin detemir (LEVEMIR) 100 UNIT/ML injection Inject 25 Units into the skin 2 (two) times daily. Reported on 04/16/2016   Yes Historical Provider, MD  metFORMIN (GLUCOPHAGE) 500 MG tablet Take 1 tablet (500 mg total) by mouth 2 (two) times daily with a meal. 09/08/15  Yes Vanetta Mulders, MD  tetrahydrozoline-zinc (VISINE-AC) 0.05-0.25 % ophthalmic solution Place 2 drops into both eyes 3 (three) times daily as needed.   Yes Historical Provider, MD  azithromycin (ZITHROMAX) 250 MG tablet Take1 every day until finished. Start 7/5. Patient not taking: Reported on 07/11/2016 06/04/16   Ivery Quale, PA-C  cephALEXin (KEFLEX) 250 MG capsule Take 1 capsule (250 mg total) by mouth 4 (four) times daily. 07/11/16   Loren Racer, MD  dexamethasone (DECADRON) 4 MG tablet Take 1 tablet (4 mg total) by mouth 2 (two) times daily with a meal. Patient not taking: Reported on 07/11/2016 06/04/16   Ivery Quale, PA-C  ondansetron (ZOFRAN) 4 MG tablet Take 1 tablet (4 mg total) by mouth every 6 (six) hours as needed for nausea or vomiting. 07/11/16   Loren Racer, MD  triamcinolone ointment (KENALOG) 0.5 % Apply 1 application topically 2 (two) times daily. Patient not taking: Reported on 07/11/2016 05/16/16   Ivery Quale, PA-C    Family History No family history on file.  Social History Social History  Substance Use Topics  . Smoking status:  Current Every Day Smoker    Packs/day: 0.50    Types: Cigarettes  . Smokeless tobacco: Never Used  . Alcohol use No     Allergies   Shellfish allergy and Percocet [oxycodone-acetaminophen]   Review of Systems Review of Systems  Constitutional: Positive for fatigue. Negative for chills and fever.  Respiratory: Negative for shortness of breath.   Cardiovascular: Negative for chest pain.  Gastrointestinal: Positive for diarrhea, nausea and vomiting. Negative for abdominal pain and blood in stool.  Genitourinary: Positive  for dysuria. Negative for difficulty urinating, flank pain and frequency.  Musculoskeletal: Negative for back pain, myalgias and neck pain.  Skin: Negative for rash and wound.  Neurological: Negative for dizziness, weakness, light-headedness, numbness and headaches.  All other systems reviewed and are negative.    Physical Exam Updated Vital Signs BP 125/68 (BP Location: Left Arm)   Pulse 89   Temp 98.9 F (37.2 C) (Oral)   Resp 16   Ht 5\' 7"  (1.702 m)   Wt 290 lb (131.5 kg)   LMP 06/13/2016 (Exact Date)   SpO2 99%   BMI 45.42 kg/m   Physical Exam  Constitutional: She is oriented to person, place, and time. She appears well-developed and well-nourished.  HENT:  Head: Normocephalic and atraumatic.  Mouth/Throat: Oropharynx is clear and moist.  Eyes: EOM are normal. Pupils are equal, round, and reactive to light.  Neck: Normal range of motion. Neck supple.  Cardiovascular: Normal rate and regular rhythm.   Pulmonary/Chest: Effort normal and breath sounds normal.  Abdominal: Soft. Bowel sounds are normal. There is no tenderness. There is no rebound and no guarding.  Abdomen is soft and nontender  Musculoskeletal: Normal range of motion. She exhibits no edema or tenderness.  Neurological: She is alert and oriented to person, place, and time.  Skin: Skin is warm and dry. No rash noted. No erythema.  Psychiatric: She has a normal mood and affect. Her behavior is normal.  Nursing note and vitals reviewed.    ED Treatments / Results  Labs (all labs ordered are listed, but only abnormal results are displayed) Labs Reviewed  URINALYSIS, ROUTINE W REFLEX MICROSCOPIC (NOT AT [facility]) - Abnormal; Notable for the following:       Result Value   Glucose, UA >1000 (*)    Hgb urine dipstick TRACE (*)    Protein, ur TRACE (*)    Nitrite POSITIVE (*)    All other components within normal limits  URINE MICROSCOPIC-ADD ON - Abnormal; Notable for the following:    Squamous Epithelial /  LPF 6-30 (*)    Bacteria, UA MANY (*)    All other components within normal limits  I-STAT CHEM 8, ED - Abnormal; Notable for the following:    Glucose, Bld 334 (*)    All other components within normal limits  CBG MONITORING, ED - Abnormal; Notable for the following:    Glucose-Capillary 367 (*)    All other components within normal limits  POC URINE PREG, ED    EKG  EKG Interpretation None       Radiology No results found.  Procedures Procedures (including critical care time)  Medications Ordered in ED Medications  ondansetron (ZOFRAN-ODT) disintegrating tablet 4 mg (4 mg Oral Given 07/11/16 1334)     Initial Impression / Assessment and Plan / ED Course  I have reviewed the triage vital signs and the nursing notes.  Pertinent labs & imaging results that were available during my care of the patient  were reviewed by me and considered in my medical decision making (see chart for details).  Clinical Course    Patient is well-appearing. I will exam is benign. No evidence of dehydration. Patient is drinking fluids without further nausea or vomiting. No evidence of DKA. We'll treat with antiemetics and antibiotic for UTI. Return precautions given. Final Clinical Impressions(s) / ED Diagnoses   Final diagnoses:  UTI (lower urinary tract infection)  Vomiting and diarrhea  Hyperglycemia    New Prescriptions New Prescriptions   CEPHALEXIN (KEFLEX) 250 MG CAPSULE    Take 1 capsule (250 mg total) by mouth 4 (four) times daily.   ONDANSETRON (ZOFRAN) 4 MG TABLET    Take 1 tablet (4 mg total) by mouth every 6 (six) hours as needed for nausea or vomiting.     Loren Racer, MD 07/11/16 [phone]

## 2016-07-11 NOTE — ED Triage Notes (Signed)
N/V/D started this am.  Vomited once and 4 loose-watery stool (light brown).  C/o nausea.

## 2016-08-06 ENCOUNTER — Emergency Department (HOSPITAL_COMMUNITY)
Admission: EM | Admit: 2016-08-06 | Discharge: 2016-08-06 | Disposition: A | Discharge status: Home / Self Care | Payer: Self-pay | Attending: Emergency Medicine | Admitting: Emergency Medicine

## 2016-08-06 ENCOUNTER — Encounter (HOSPITAL_COMMUNITY): Payer: Self-pay | Admitting: Emergency Medicine

## 2016-08-06 DIAGNOSIS — Z7984 Long term (current) use of oral hypoglycemic drugs: Secondary | ICD-10-CM | POA: Insufficient documentation

## 2016-08-06 DIAGNOSIS — R112 Nausea with vomiting, unspecified: Secondary | ICD-10-CM | POA: Insufficient documentation

## 2016-08-06 DIAGNOSIS — R197 Diarrhea, unspecified: Secondary | ICD-10-CM | POA: Insufficient documentation

## 2016-08-06 DIAGNOSIS — Z79899 Other long term (current) drug therapy: Secondary | ICD-10-CM | POA: Insufficient documentation

## 2016-08-06 DIAGNOSIS — Z794 Long term (current) use of insulin: Secondary | ICD-10-CM | POA: Insufficient documentation

## 2016-08-06 DIAGNOSIS — F17210 Nicotine dependence, cigarettes, uncomplicated: Secondary | ICD-10-CM | POA: Insufficient documentation

## 2016-08-06 DIAGNOSIS — E119 Type 2 diabetes mellitus without complications: Secondary | ICD-10-CM | POA: Insufficient documentation

## 2016-08-06 LAB — COMPREHENSIVE METABOLIC PANEL
ALT: 13 U/L — AB (ref 14–54)
AST: 14 U/L — AB (ref 15–41)
Albumin: 3.5 g/dL (ref 3.5–5.0)
Alkaline Phosphatase: 64 U/L (ref 38–126)
Anion gap: 8 (ref 5–15)
BUN: 13 mg/dL (ref 6–20)
CHLORIDE: 105 mmol/L (ref 101–111)
CO2: 25 mmol/L (ref 22–32)
Calcium: 8.9 mg/dL (ref 8.9–10.3)
Creatinine, Ser: 0.79 mg/dL (ref 0.44–1.00)
GFR calc Af Amer: >60 mL/min (ref >60)
GFR calc non Af Amer: >60 mL/min (ref >60)
Glucose, Bld: 220 mg/dL — ABNORMAL HIGH (ref 65–99)
POTASSIUM: 4.1 mmol/L (ref 3.5–5.1)
SODIUM: 138 mmol/L (ref 135–145)
Total Bilirubin: 0.4 mg/dL (ref 0.3–1.2)
Total Protein: 6.8 g/dL (ref 6.5–8.1)

## 2016-08-06 LAB — CBC
HEMATOCRIT: 44.3 % (ref 36.0–46.0)
Hemoglobin: 14.5 g/dL (ref 12.0–15.0)
MCH: 26.9 pg (ref 26.0–34.0)
MCHC: 32.7 g/dL (ref 30.0–36.0)
MCV: 82 fL (ref 78.0–100.0)
PLATELETS: 205 10*3/uL (ref 150–400)
RBC: 5.4 MIL/uL — AB (ref 3.87–5.11)
RDW: 14.4 % (ref 11.5–15.5)
WBC: 8.5 10*3/uL (ref 4.0–10.5)

## 2016-08-06 LAB — URINALYSIS, ROUTINE W REFLEX MICROSCOPIC
Bilirubin Urine: NEGATIVE
GLUCOSE, UA: NEGATIVE mg/dL
HGB URINE DIPSTICK: NEGATIVE
Ketones, ur: NEGATIVE mg/dL
Leukocytes, UA: NEGATIVE
Nitrite: POSITIVE — AB
PH: 6 (ref 5.0–8.0)
Specific Gravity, Urine: 1.02 (ref 1.005–1.030)

## 2016-08-06 LAB — URINE MICROSCOPIC-ADD ON: RBC / HPF: NONE SEEN RBC/hpf (ref 0–5)

## 2016-08-06 LAB — PREGNANCY, URINE: Preg Test, Ur: NEGATIVE

## 2016-08-06 LAB — LIPASE, BLOOD: LIPASE: 22 U/L (ref 11–51)

## 2016-08-06 MED ORDER — ONDANSETRON 8 MG PO TBDP
8.0000 mg | ORAL_TABLET | Freq: Once | ORAL | Status: AC
  Administered 2016-08-06: 8 mg via ORAL
  Filled 2016-08-06: qty 1

## 2016-08-06 MED ORDER — DIPHENOXYLATE-ATROPINE 2.5-0.025 MG PO TABS: 1.0000 | ORAL_TABLET | Freq: Four times a day (QID) | ORAL | 0 refills | Status: DC | PRN

## 2016-08-06 MED ORDER — DIPHENOXYLATE-ATROPINE 2.5-0.025 MG PO TABS
2.0000 | ORAL_TABLET | Freq: Once | ORAL | Status: AC
  Administered 2016-08-06: 2 via ORAL
  Filled 2016-08-06: qty 2

## 2016-08-06 MED ORDER — ONDANSETRON 4 MG PO TBDP: 4.0000 mg | ORAL_TABLET | Freq: Three times a day (TID) | ORAL | 0 refills | Status: DC | PRN

## 2016-08-06 MED ORDER — DICYCLOMINE HCL 10 MG/ML IM SOLN
20.0000 mg | Freq: Once | INTRAMUSCULAR | Status: AC
  Administered 2016-08-06: 20 mg via INTRAMUSCULAR
  Filled 2016-08-06: qty 2

## 2016-08-06 MED ORDER — DICYCLOMINE HCL 20 MG PO TABS: 20.0000 mg | ORAL_TABLET | Freq: Two times a day (BID) | ORAL | 0 refills | Status: DC

## 2016-08-06 MED ORDER — MORPHINE SULFATE (PF) 4 MG/ML IV SOLN
INTRAVENOUS | Status: AC
  Filled 2016-08-06: qty 1

## 2016-08-06 NOTE — ED Provider Notes (Signed)
[facility] DEPT Provider Note   CSN: [id number] Arrival date & time: 08/06/16  1129     History   Chief Complaint Chief Complaint  Patient presents with  . Emesis    HPI Rachel George is a 35 y.o. female. History of diabetes. Nausea and vomiting since last night. Some diarrhea this morning and abdominal cramping. Heme-negative nonbilious emesis. No fevers or chills. No localizing abdominal pain no dysuria for used to hematuria. No vaginal bleeding or discharge.  HPI  Past Medical History:  Diagnosis Date  . Diabetes mellitus without complication (HCC)   . Fatty liver disease, nonalcoholic   . Macular degeneration     There are no active problems to display for this patient.   History reviewed. No pertinent surgical history.  OB History    No data available       Home Medications    Prior to Admission medications   Medication Sig Start Date End Date Taking? Authorizing Provider  albuterol (PROVENTIL HFA;VENTOLIN HFA) 108 (90 Base) MCG/ACT inhaler Inhale 2 puffs into the lungs every 6 (six) hours as needed for wheezing or shortness of breath.   Yes Historical Provider, MD  bismuth subsalicylate (PEPTO BISMOL) 262 MG/15ML suspension Take 30 mLs by mouth every 6 (six) hours as needed.   Yes Historical Provider, MD  insulin aspart (NOVOLOG) 100 UNIT/ML injection Inject 5 Units into the skin 3 (three) times daily before meals. Reported on 04/16/2016   Yes Historical Provider, MD  insulin detemir (LEVEMIR) 100 UNIT/ML injection Inject 25 Units into the skin 2 (two) times daily. Reported on 04/16/2016   Yes Historical Provider, MD  loperamide (IMODIUM) 2 MG capsule Take 2 mg by mouth as needed for diarrhea or loose stools.   Yes Historical Provider, MD  metFORMIN (GLUCOPHAGE) 500 MG tablet Take 1 tablet (500 mg total) by mouth 2 (two) times daily with a meal. 09/08/15  Yes Vanetta Mulders, MD  ondansetron (ZOFRAN) 4 MG tablet Take 1 tablet (4 mg total) by mouth every 6 (six)  hours as needed for nausea or vomiting. 07/11/16  Yes Loren Racer, MD  tetrahydrozoline-zinc (VISINE-AC) 0.05-0.25 % ophthalmic solution Place 2 drops into both eyes 3 (three) times daily as needed.   Yes Historical Provider, MD  azithromycin (ZITHROMAX) 250 MG tablet Take1 every day until finished. Start 7/5. Patient not taking: Reported on 07/11/2016 06/04/16   Ivery Quale, PA-C  cephALEXin (KEFLEX) 250 MG capsule Take 1 capsule (250 mg total) by mouth 4 (four) times daily. Patient not taking: Reported on 08/06/2016 07/11/16   Loren Racer, MD  dexamethasone (DECADRON) 4 MG tablet Take 1 tablet (4 mg total) by mouth 2 (two) times daily with a meal. Patient not taking: Reported on 07/11/2016 06/04/16   Ivery Quale, PA-C  diphenhydrAMINE (BENADRYL) 25 MG tablet Take 1 tablet (25 mg total) by mouth every 6 (six) hours. Patient taking differently: Take 25 mg by mouth every 6 (six) hours as needed for allergies.  05/16/16   Ivery Quale, PA-C  triamcinolone ointment (KENALOG) 0.5 % Apply 1 application topically 2 (two) times daily. Patient not taking: Reported on 07/11/2016 05/16/16   Ivery Quale, PA-C    Family History No family history on file.  Social History Social History  Substance Use Topics  . Smoking status: Current Every Day Smoker    Packs/day: 0.50    Types: Cigarettes  . Smokeless tobacco: Never Used  . Alcohol use No     Allergies   Shellfish allergy and  Percocet [oxycodone-acetaminophen]   Review of Systems Review of Systems  Constitutional: Negative for appetite change, chills, diaphoresis, fatigue and fever.  HENT: Negative for mouth sores, sore throat and trouble swallowing.   Eyes: Negative for visual disturbance.  Respiratory: Negative for cough, chest tightness, shortness of breath and wheezing.   Cardiovascular: Negative for chest pain.  Gastrointestinal: Positive for diarrhea, nausea and vomiting. Negative for abdominal distention and abdominal pain.    Endocrine: Negative for polydipsia, polyphagia and polyuria.  Genitourinary: Negative for dysuria, frequency and hematuria.  Musculoskeletal: Negative for gait problem.  Skin: Negative for color change, pallor and rash.  Neurological: Negative for dizziness, syncope, light-headedness and headaches.  Hematological: Does not bruise/bleed easily.  Psychiatric/Behavioral: Negative for behavioral problems and confusion.     Physical Exam Updated Vital Signs BP 122/71 (BP Location: Left Arm)   Pulse 102   Temp 98.4 F (36.9 C) (Oral)   Resp 22   Ht 5\' 7"  (1.702 m)   Wt 290 lb (131.5 kg)   LMP 07/21/2016   SpO2 93%   BMI 45.42 kg/m   Physical Exam  Constitutional: She is oriented to person, place, and time. She appears well-developed and well-nourished. No distress.  HENT:  Head: Normocephalic.  Eyes: Conjunctivae are normal. Pupils are equal, round, and reactive to light. No scleral icterus.  Neck: Normal range of motion. Neck supple. No thyromegaly present.  Cardiovascular: Normal rate and regular rhythm.  Exam reveals no gallop and no friction rub.   No murmur heard. Pulmonary/Chest: Effort normal and breath sounds normal. No respiratory distress. She has no wheezes. She has no rales.  Abdominal: Soft. Bowel sounds are normal. She exhibits no distension. There is no tenderness. There is no rebound.  No localizing tenderness. No guarding or rebound. Normal active bowel sounds.  Musculoskeletal: Normal range of motion.  Neurological: She is alert and oriented to person, place, and time.  Skin: Skin is warm and dry. No rash noted.  Psychiatric: She has a normal mood and affect. Her behavior is normal.     ED Treatments / Results  Labs (all labs ordered are listed, but only abnormal results are displayed) Labs Reviewed  COMPREHENSIVE METABOLIC PANEL - Abnormal; Notable for the following:       Result Value   Glucose, Bld 220 (*)    AST 14 (*)    ALT 13 (*)    All other  components within normal limits  CBC - Abnormal; Notable for the following:    RBC 5.40 (*)    All other components within normal limits  LIPASE, BLOOD  URINALYSIS, ROUTINE W REFLEX MICROSCOPIC (NOT AT [facility])  PREGNANCY, URINE    EKG  EKG Interpretation None       Radiology No results found.  Procedures Procedures (including critical care time)  Medications Ordered in ED Medications  ondansetron (ZOFRAN-ODT) disintegrating tablet 8 mg (8 mg Oral Given 08/06/16 1318)  dicyclomine (BENTYL) injection 20 mg (20 mg Intramuscular Given 08/06/16 1319)  diphenoxylate-atropine (LOMOTIL) 2.5-0.025 MG per tablet 2 tablet (2 tablets Oral Given 08/06/16 1318)     Initial Impression / Assessment and Plan / ED Course  I have reviewed the triage vital signs and the nursing notes.  Pertinent labs & imaging results that were available during my care of the patient were reviewed by me and considered in my medical decision making (see chart for details).  Clinical Course    Given IM Bentyl. Given by mouth Zofran and Lomotil. Symptoms  improved. Taking by mouth. Sugar 220. Not acidotic. Appropriate for discharge. Plan Zofran, Bentyl, push fluids  Final Clinical Impressions(s) / ED Diagnoses   Final diagnoses:  None    New Prescriptions New Prescriptions   No medications on file     Rolland Porter, MD 08/06/16 1432

## 2016-08-06 NOTE — Discharge Instructions (Signed)
Push fluids , stay hydrated.

## 2016-08-06 NOTE — ED Notes (Signed)
Pt requested not to have an IV if possible. Would prefer PO zofran and fluid challenge.

## 2016-08-06 NOTE — ED Triage Notes (Signed)
Pt c/o n/v/d and malaise that began this morning. States that her husband has the stomach virus.

## 2016-09-04 ENCOUNTER — Emergency Department (HOSPITAL_COMMUNITY)
Admission: EM | Admit: 2016-09-04 | Discharge: 2016-09-04 | Disposition: A | Discharge status: Home / Self Care | Payer: Self-pay | Attending: Emergency Medicine | Admitting: Emergency Medicine

## 2016-09-04 ENCOUNTER — Encounter (HOSPITAL_COMMUNITY): Payer: Self-pay | Admitting: Emergency Medicine

## 2016-09-04 ENCOUNTER — Emergency Department (HOSPITAL_COMMUNITY): Payer: Self-pay

## 2016-09-04 DIAGNOSIS — Y939 Activity, unspecified: Secondary | ICD-10-CM | POA: Insufficient documentation

## 2016-09-04 DIAGNOSIS — Z7984 Long term (current) use of oral hypoglycemic drugs: Secondary | ICD-10-CM | POA: Insufficient documentation

## 2016-09-04 DIAGNOSIS — E119 Type 2 diabetes mellitus without complications: Secondary | ICD-10-CM | POA: Insufficient documentation

## 2016-09-04 DIAGNOSIS — Y999 Unspecified external cause status: Secondary | ICD-10-CM | POA: Insufficient documentation

## 2016-09-04 DIAGNOSIS — W16212A Fall in (into) filled bathtub causing other injury, initial encounter: Secondary | ICD-10-CM | POA: Insufficient documentation

## 2016-09-04 DIAGNOSIS — L03115 Cellulitis of right lower limb: Secondary | ICD-10-CM | POA: Insufficient documentation

## 2016-09-04 DIAGNOSIS — F17210 Nicotine dependence, cigarettes, uncomplicated: Secondary | ICD-10-CM | POA: Insufficient documentation

## 2016-09-04 DIAGNOSIS — Y929 Unspecified place or not applicable: Secondary | ICD-10-CM | POA: Insufficient documentation

## 2016-09-04 DIAGNOSIS — B351 Tinea unguium: Secondary | ICD-10-CM | POA: Insufficient documentation

## 2016-09-04 DIAGNOSIS — L089 Local infection of the skin and subcutaneous tissue, unspecified: Secondary | ICD-10-CM

## 2016-09-04 DIAGNOSIS — Z79899 Other long term (current) drug therapy: Secondary | ICD-10-CM | POA: Insufficient documentation

## 2016-09-04 DIAGNOSIS — Z794 Long term (current) use of insulin: Secondary | ICD-10-CM | POA: Insufficient documentation

## 2016-09-04 LAB — BASIC METABOLIC PANEL
Anion gap: 6 (ref 5–15)
BUN: 11 mg/dL (ref 6–20)
CHLORIDE: 105 mmol/L (ref 101–111)
CO2: 25 mmol/L (ref 22–32)
Calcium: 8.7 mg/dL — ABNORMAL LOW (ref 8.9–10.3)
Creatinine, Ser: 0.69 mg/dL (ref 0.44–1.00)
GFR calc Af Amer: >60 mL/min (ref >60)
GFR calc non Af Amer: >60 mL/min (ref >60)
GLUCOSE: 219 mg/dL — AB (ref 65–99)
POTASSIUM: 4 mmol/L (ref 3.5–5.1)
Sodium: 136 mmol/L (ref 135–145)

## 2016-09-04 LAB — CBC WITH DIFFERENTIAL/PLATELET
Basophils Absolute: 0 10*3/uL (ref 0.0–0.1)
Basophils Relative: 0 %
Eosinophils Absolute: 0.2 10*3/uL (ref 0.0–0.7)
Eosinophils Relative: 2 %
HCT: 42.2 % (ref 36.0–46.0)
HEMOGLOBIN: 14 g/dL (ref 12.0–15.0)
LYMPHS ABS: 4.3 10*3/uL — AB (ref 0.7–4.0)
LYMPHS PCT: 55 %
MCH: 27 pg (ref 26.0–34.0)
MCHC: 33.2 g/dL (ref 30.0–36.0)
MCV: 81.3 fL (ref 78.0–100.0)
MONOS PCT: 6 %
Monocytes Absolute: 0.5 10*3/uL (ref 0.1–1.0)
NEUTROS PCT: 37 %
Neutro Abs: 2.9 10*3/uL (ref 1.7–7.7)
Platelets: 199 10*3/uL (ref 150–400)
RBC: 5.19 MIL/uL — AB (ref 3.87–5.11)
RDW: 14.1 % (ref 11.5–15.5)
WBC: 7.9 10*3/uL (ref 4.0–10.5)

## 2016-09-04 LAB — CBG MONITORING, ED: GLUCOSE-CAPILLARY: 218 mg/dL — AB (ref 65–99)

## 2016-09-04 MED ORDER — AMOXICILLIN-POT CLAVULANATE 875-125 MG PO TABS
1.0000 | ORAL_TABLET | Freq: Two times a day (BID) | ORAL | 0 refills | Status: DC
Start: 2016-09-04 — End: 2016-10-03

## 2016-09-04 MED ORDER — MORPHINE SULFATE (PF) 4 MG/ML IV SOLN
4.0000 mg | Freq: Once | INTRAVENOUS | Status: AC
  Administered 2016-09-04: 4 mg via INTRAVENOUS
  Filled 2016-09-04: qty 1

## 2016-09-04 MED ORDER — ONDANSETRON HCL 4 MG/2ML IJ SOLN
4.0000 mg | Freq: Once | INTRAMUSCULAR | Status: AC
  Administered 2016-09-04: 4 mg via INTRAVENOUS
  Filled 2016-09-04: qty 2

## 2016-09-04 MED ORDER — TRAMADOL HCL 50 MG PO TABS: 50.0000 mg | ORAL_TABLET | Freq: Four times a day (QID) | ORAL | 0 refills | Status: DC | PRN

## 2016-09-04 MED ORDER — CEFAZOLIN IN D5W 1 GM/50ML IV SOLN
1.0000 g | Freq: Once | INTRAVENOUS | Status: AC
  Administered 2016-09-04: 1 g via INTRAVENOUS
  Filled 2016-09-04: qty 50

## 2016-09-04 NOTE — ED Provider Notes (Signed)
[facility] Provider Note   CSN: [id number] Arrival date & time: 09/04/16  1044     History   Chief Complaint Chief Complaint  Patient presents with  . Foot Pain    HPI Rachel BarkerKaren George is a 35 y.o. female with a past medical history significant for DM, presenting for evaluation of right ankle pain since she slipped in the bathtub and inverted her ankle last week. Pain is aching, constant and worse with palpation, movement and weight bearing.  The patient was able to weight bear immediately after the event.  There is no radiation of pain and the patient denies numbness distal to the injury site.  The patients treatment prior to arrival included rest and ice.  Additionally she reports chronic toenail fungal infection along with increased swelling of her right great toe with purulent drainage from around the toenail edge.  She has noticed increased redness across her foot since the fall with soreness that is radiating into her mid anterior shin.  She suspects she bumped the nail during her fall which has been chronically thickened and raised.   Foot Pain     Past Medical History:  Diagnosis Date  . Diabetes mellitus without complication (HCC)   . Fatty liver disease, nonalcoholic   . Macular degeneration     There are no active problems to display for this patient.   History reviewed. No pertinent surgical history.  OB History    No data available       Home Medications    Prior to Admission medications   Medication Sig Start Date End Date Taking? Authorizing Provider  albuterol (PROVENTIL HFA;VENTOLIN HFA) 108 (90 Base) MCG/ACT inhaler Inhale 2 puffs into the lungs every 6 (six) hours as needed for wheezing or shortness of breath.    Historical Provider, MD  amoxicillin-clavulanate (AUGMENTIN) 875-125 MG tablet Take 1 tablet by mouth every 12 (twelve) hours. 09/04/16   Burgess AmorJulie Aedyn Mckeon, PA-C  bismuth subsalicylate (PEPTO BISMOL) 262 MG/15ML suspension Take 30 mLs by  mouth every 6 (six) hours as needed.    Historical Provider, MD  dexamethasone (DECADRON) 4 MG tablet Take 1 tablet (4 mg total) by mouth 2 (two) times daily with a meal. Patient not taking: Reported on 07/11/2016 06/04/16   Ivery QualeHobson Bryant, PA-C  dicyclomine (BENTYL) 20 MG tablet Take 1 tablet (20 mg total) by mouth 2 (two) times daily. 08/06/16   Rolland PorterMark [name], MD  diphenhydrAMINE (BENADRYL) 25 MG tablet Take 1 tablet (25 mg total) by mouth every 6 (six) hours. Patient taking differently: Take 25 mg by mouth every 6 (six) hours as needed for allergies.  05/16/16   Ivery QualeHobson Bryant, PA-C  diphenoxylate-atropine (LOMOTIL) 2.5-0.025 MG tablet Take 1 tablet by mouth 4 (four) times daily as needed for diarrhea or loose stools. 08/06/16   Rolland PorterMark [name], MD  insulin aspart (NOVOLOG) 100 UNIT/ML injection Inject 5 Units into the skin 3 (three) times daily before meals. Reported on 04/16/2016    Historical Provider, MD  insulin detemir (LEVEMIR) 100 UNIT/ML injection Inject 25 Units into the skin 2 (two) times daily. Reported on 04/16/2016    Historical Provider, MD  loperamide (IMODIUM) 2 MG capsule Take 2 mg by mouth as needed for diarrhea or loose stools.    Historical Provider, MD  metFORMIN (GLUCOPHAGE) 500 MG tablet Take 1 tablet (500 mg total) by mouth 2 (two) times daily with a meal. 09/08/15   Vanetta MuldersScott Zackowski, MD  ondansetron (ZOFRAN ODT) 4 MG disintegrating tablet Take 1  tablet (4 mg total) by mouth every 8 (eight) hours as needed for nausea. 08/06/16   Rolland Porter, MD  ondansetron (ZOFRAN) 4 MG tablet Take 1 tablet (4 mg total) by mouth every 6 (six) hours as needed for nausea or vomiting. 07/11/16   Loren Racer, MD  tetrahydrozoline-zinc (VISINE-AC) 0.05-0.25 % ophthalmic solution Place 2 drops into both eyes 3 (three) times daily as needed.    Historical Provider, MD  traMADol (ULTRAM) 50 MG tablet Take 1 tablet (50 mg total) by mouth every 6 (six) hours as needed. 09/04/16   Burgess Amor, PA-C  triamcinolone ointment  (KENALOG) 0.5 % Apply 1 application topically 2 (two) times daily. Patient not taking: Reported on 07/11/2016 05/16/16   Ivery Quale, PA-C    Family History No family history on file.  Social History Social History  Substance Use Topics  . Smoking status: Current Every Day Smoker    Packs/day: 0.50    Types: Cigarettes  . Smokeless tobacco: Never Used  . Alcohol use No     Allergies   Shellfish allergy and Percocet [oxycodone-acetaminophen]   Review of Systems Review of Systems  Musculoskeletal: Positive for arthralgias and joint swelling.  Skin: Negative for wound.  Neurological: Negative for weakness and numbness.     Physical Exam Updated Vital Signs BP 146/88 (BP Location: Right Arm)   Pulse 87   Temp 98.2 F (36.8 C) (Oral)   Resp 18   Ht 5\' 7"  (1.702 m)   Wt 131.5 kg   LMP 08/16/2016   SpO2 96%   BMI 45.42 kg/m   Physical Exam  Constitutional: She appears well-developed and well-nourished.  HENT:  Head: Normocephalic.  Cardiovascular: Normal rate and intact distal pulses.  Exam reveals no decreased pulses.   Pulses:      Dorsalis pedis pulses are 2+ on the right side, and 2+ on the left side.       Posterior tibial pulses are 2+ on the right side, and 2+ on the left side.  Musculoskeletal: She exhibits edema and tenderness.       Right ankle: She exhibits decreased range of motion, swelling and ecchymosis. She exhibits normal pulse. Tenderness. Medial malleolus tenderness found. No head of 5th metatarsal and no proximal fibula tenderness found. Achilles tendon normal.  Right great toe with edema and plantar callus formation.  Toe nail is dark, thickened and raised but intact.  There is a small amount of purulent drainage around the proximal nail fold.  No fluctuance or sign of drainable abscess pocket.  Dorsal foot with warm erythema.   Neurological: She is alert. No sensory deficit.  Skin: Skin is warm, dry and intact.  Nursing note and vitals  reviewed.    ED Treatments / Results  Labs (all labs ordered are listed, but only abnormal results are displayed) Labs Reviewed  CBC WITH DIFFERENTIAL/PLATELET - Abnormal; Notable for the following:       Result Value   RBC 5.19 (*)    Lymphs Abs 4.3 (*)    All other components within normal limits  BASIC METABOLIC PANEL - Abnormal; Notable for the following:    Glucose, Bld 219 (*)    Calcium 8.7 (*)    All other components within normal limits  CBG MONITORING, ED - Abnormal; Notable for the following:    Glucose-Capillary 218 (*)    All other components within normal limits    EKG  EKG Interpretation None       Radiology Dg  Ankle Complete Right  Result Date: 09/04/2016 CLINICAL DATA:  Fall in shower last week.  Ankle pain. EXAM: RIGHT ANKLE - COMPLETE 3+ VIEW COMPARISON:  None. FINDINGS: Mild generalized soft tissue swelling. Irregularity at the tip of the medial malleolus could reflect avulsion fracture, age indeterminate. No fibular abnormality. Joint spaces are maintained. Plantar calcaneal spur noted. IMPRESSION: Questionable avulsion fracture off the tip of the medial malleolus, age indeterminate. Electronically Signed   By: Charlett Nose M.D.   On: 09/04/2016 11:56   Dg Foot Complete Right  Result Date: 09/04/2016 CLINICAL DATA:  Fall in shower last week at home.  Ankle pain. EXAM: RIGHT FOOT COMPLETE - 3+ VIEW COMPARISON:  None. FINDINGS: There is no evidence of fracture or dislocation. There is no evidence of arthropathy or other focal bone abnormality. Soft tissues are unremarkable. IMPRESSION: Negative. Electronically Signed   By: Charlett Nose M.D.   On: 09/04/2016 11:55    Procedures Procedures (including critical care time)  Medications Ordered in ED Medications  morphine 4 MG/ML injection 4 mg (4 mg Intravenous Given 09/04/16 1309)  ondansetron (ZOFRAN) injection 4 mg (4 mg Intravenous Given 09/04/16 1309)  ceFAZolin (ANCEF) IVPB 1 g/50 mL premix (1 g  Intravenous New Bag/Given 09/04/16 1340)     Initial Impression / Assessment and Plan / ED Course  I have reviewed the triage vital signs and the nursing notes.  Pertinent labs & imaging results that were available during my care of the patient were reviewed by me and considered in my medical decision making (see chart for details).  Clinical Course    Discussed admission for further IV abx, pt defers, stating would rather try to treat at home.  She understands to return here for any worsening sx including increased pain, spreading redness or swelling.  She was given a dose of IV ancef here, augmentin prescribed (with goodrx coupon), tramadol.  Epsom salt soaks, rest, elevation. She is currently pt of health dept but is transferring her care to the [facility]  She was also given podiatry referral as she will need long term tx of the fungus and diabetic foot care once the acute infection is resolved.  Pt understands and agrees with plan.    Pt was seen by Dr. Lynelle Doctor prior to dc home.  Final Clinical Impressions(s) / ED Diagnoses   Final diagnoses:  Cellulitis of right lower extremity  Fungal toenail infection  Toe infection    New Prescriptions New Prescriptions   AMOXICILLIN-CLAVULANATE (AUGMENTIN) 875-125 MG TABLET    Take 1 tablet by mouth every 12 (twelve) hours.   TRAMADOL (ULTRAM) 50 MG TABLET    Take 1 tablet (50 mg total) by mouth every 6 (six) hours as needed.     Burgess Amor, PA-C 09/05/16 1145    Linwood Dibbles, MD 09/07/16 [phone]

## 2016-09-04 NOTE — Discharge Instructions (Signed)
As discussed you have a diabetic foot infection in association with the fungal infection which will need to be addressed once the bacterial infection is resolved.  Take the entire course of your antibiotics, taking your first tablet this evening.  Do a warm Epsom salt soak with your foot for 15 minutes twice daily.  Return here for recheck in 2 days, sooner if your symptoms are worsening as discussed.

## 2016-09-04 NOTE — ED Notes (Signed)
Patient transported to X-ray 

## 2016-09-04 NOTE — ED Triage Notes (Signed)
Pt c/o right foot pain and swelling since falling last week.

## 2016-09-05 NOTE — ED Provider Notes (Signed)
Pt is a 35 y.o. female who presents with  Chief Complaint  Patient presents with  . Foot Pain  Pt presented to the ED with complaints of toe pain and swelling.  Also complained of ankle pain after a recent fall.  Physical Exam  Constitutional: No distress.  HENT:  Head: Normocephalic and atraumatic.  Eyes: Conjunctivae are normal. Left eye exhibits no discharge. No scleral icterus.  Neck: No tracheal deviation present. No thyromegaly present.  Pulmonary/Chest: Effort normal and breath sounds normal. No stridor.  Abdominal: She exhibits no distension.  Musculoskeletal: She exhibits edema and tenderness. She exhibits no deformity.  Great toe on right with callus formation, evidence of paronychia formation, difficult to assess if there is purulence below the callus although no fluctuance when palpated, erythema of the toe with some extension to the midfoot, onychomycosis of the toenails  Neurological: She is alert.  Skin: Skin is warm. No rash noted. She is not diaphoretic. No erythema.  Psychiatric: Affect normal.    Clinical Course  Pt has evidence of infection.  Spontaneously draining paronychia.  Non toxic and well appearing.  Discussed concern about her diabetes and the infection.  Pt does not want to be admitted to the hospital.  Reasonable to try oral abx with close follow up.    Cellulitis of right lower extremity  Fungal toenail infection  Toe infection   Medical screening examination/treatment/procedure(s) were conducted as a shared visit with non-physician practitioner(s) and myself.  I personally evaluated the patient during the encounter.        Linwood DibblesJon Mariadelosang Wynns, MD 09/05/16 [phone]

## 2016-09-06 ENCOUNTER — Emergency Department (HOSPITAL_COMMUNITY)
Admission: EM | Admit: 2016-09-06 | Discharge: 2016-09-06 | Disposition: A | Discharge status: Home / Self Care | Payer: Self-pay | Attending: Emergency Medicine | Admitting: Emergency Medicine

## 2016-09-06 ENCOUNTER — Encounter (HOSPITAL_COMMUNITY): Payer: Self-pay | Admitting: Emergency Medicine

## 2016-09-06 DIAGNOSIS — E119 Type 2 diabetes mellitus without complications: Secondary | ICD-10-CM | POA: Insufficient documentation

## 2016-09-06 DIAGNOSIS — F17210 Nicotine dependence, cigarettes, uncomplicated: Secondary | ICD-10-CM | POA: Insufficient documentation

## 2016-09-06 DIAGNOSIS — L02611 Cutaneous abscess of right foot: Secondary | ICD-10-CM | POA: Insufficient documentation

## 2016-09-06 DIAGNOSIS — Z7984 Long term (current) use of oral hypoglycemic drugs: Secondary | ICD-10-CM | POA: Insufficient documentation

## 2016-09-06 DIAGNOSIS — L03031 Cellulitis of right toe: Secondary | ICD-10-CM | POA: Insufficient documentation

## 2016-09-06 DIAGNOSIS — Z794 Long term (current) use of insulin: Secondary | ICD-10-CM | POA: Insufficient documentation

## 2016-09-06 MED ORDER — CEFTRIAXONE SODIUM 1 G IJ SOLR
1.0000 g | Freq: Once | INTRAMUSCULAR | Status: AC
  Administered 2016-09-06: 1 g via INTRAMUSCULAR
  Filled 2016-09-06: qty 10

## 2016-09-06 MED ORDER — LIDOCAINE HCL (PF) 1 % IJ SOLN
INTRAMUSCULAR | Status: AC
Start: 2016-09-06 — End: 2016-09-06
  Administered 2016-09-06: 2.1 mL
  Filled 2016-09-06: qty 5

## 2016-09-06 NOTE — Discharge Instructions (Signed)
You were treated in the emergency department today with intramuscular Rocephin. Please continue your Augmentin. Continue your soaks, elevate your foot is much as possible, change her dressing daily. Please return to the emergency department on Monday, October 9 for recheck of your cellulitis. Your temperature was 99.2 today, please keep a close check on your temperature.

## 2016-09-06 NOTE — ED Triage Notes (Signed)
PT states she is her for follow up due to cellulitis to right foot great toe and states pain/swelling has decreased since the start of her PO antibiotics.

## 2016-09-06 NOTE — ED Provider Notes (Signed)
[facility] DEPT Provider Note   CSN: [id number] Arrival date & time: 09/06/16  1478     History   Chief Complaint Chief Complaint  Patient presents with  . Follow-up    HPI Rachel George is a 35 y.o. female.  Patient's 35 year old female who presents to the emergency department requesting recheck of her right first toe. The patient was seen in the emergency department on October 4 was found that she had swelling of the right great toe with edema present. Is a small amount of purulent drainage at the nail fold was also noted some redness of the toe. The patient was treated with antibiotics and advised to return today for recheck. The patient denies any fever or chills. She states the pain is some better, but the swelling is worse.   The history is provided by the patient.    Past Medical History:  Diagnosis Date  . Diabetes mellitus without complication (HCC)   . Fatty liver disease, nonalcoholic   . Macular degeneration     There are no active problems to display for this patient.   History reviewed. No pertinent surgical history.  OB History    No data available       Home Medications    Prior to Admission medications   Medication Sig Start Date End Date Taking? Authorizing Provider  albuterol (PROVENTIL HFA;VENTOLIN HFA) 108 (90 Base) MCG/ACT inhaler Inhale 2 puffs into the lungs every 6 (six) hours as needed for wheezing or shortness of breath.    Historical Provider, MD  amoxicillin-clavulanate (AUGMENTIN) 875-125 MG tablet Take 1 tablet by mouth every 12 (twelve) hours. 09/04/16   Burgess Amor, PA-C  bismuth subsalicylate (PEPTO BISMOL) 262 MG/15ML suspension Take 30 mLs by mouth every 6 (six) hours as needed.    Historical Provider, MD  dexamethasone (DECADRON) 4 MG tablet Take 1 tablet (4 mg total) by mouth 2 (two) times daily with a meal. Patient not taking: Reported on 07/11/2016 06/04/16   Ivery Quale, PA-C  dicyclomine (BENTYL) 20 MG tablet Take 1  tablet (20 mg total) by mouth 2 (two) times daily. 08/06/16   Rolland Porter, MD  diphenhydrAMINE (BENADRYL) 25 MG tablet Take 1 tablet (25 mg total) by mouth every 6 (six) hours. Patient taking differently: Take 25 mg by mouth every 6 (six) hours as needed for allergies.  05/16/16   Ivery Quale, PA-C  diphenoxylate-atropine (LOMOTIL) 2.5-0.025 MG tablet Take 1 tablet by mouth 4 (four) times daily as needed for diarrhea or loose stools. 08/06/16   Rolland Porter, MD  insulin aspart (NOVOLOG) 100 UNIT/ML injection Inject 5 Units into the skin 3 (three) times daily before meals. Reported on 04/16/2016    Historical Provider, MD  insulin detemir (LEVEMIR) 100 UNIT/ML injection Inject 25 Units into the skin 2 (two) times daily. Reported on 04/16/2016    Historical Provider, MD  loperamide (IMODIUM) 2 MG capsule Take 2 mg by mouth as needed for diarrhea or loose stools.    Historical Provider, MD  metFORMIN (GLUCOPHAGE) 500 MG tablet Take 1 tablet (500 mg total) by mouth 2 (two) times daily with a meal. 09/08/15   Vanetta Mulders, MD  ondansetron (ZOFRAN ODT) 4 MG disintegrating tablet Take 1 tablet (4 mg total) by mouth every 8 (eight) hours as needed for nausea. 08/06/16   Rolland Porter, MD  ondansetron (ZOFRAN) 4 MG tablet Take 1 tablet (4 mg total) by mouth every 6 (six) hours as needed for nausea or vomiting. 07/11/16  Loren Racer, MD  tetrahydrozoline-zinc (VISINE-AC) 0.05-0.25 % ophthalmic solution Place 2 drops into both eyes 3 (three) times daily as needed.    Historical Provider, MD  traMADol (ULTRAM) 50 MG tablet Take 1 tablet (50 mg total) by mouth every 6 (six) hours as needed. 09/04/16   Burgess Amor, PA-C  triamcinolone ointment (KENALOG) 0.5 % Apply 1 application topically 2 (two) times daily. Patient not taking: Reported on 07/11/2016 05/16/16   Ivery Quale, PA-C    Family History History reviewed. No pertinent family history.  Social History Social History  Substance Use Topics  . Smoking status:  Current Every Day Smoker    Packs/day: 0.50    Types: Cigarettes  . Smokeless tobacco: Never Used  . Alcohol use No     Allergies   Shellfish allergy and Percocet [oxycodone-acetaminophen]   Review of Systems Review of Systems  Constitutional: Negative for chills and fever.  Musculoskeletal: Positive for arthralgias.       Foot pain  All other systems reviewed and are negative.    Physical Exam Updated Vital Signs BP 122/95 (BP Location: Left Arm)   Pulse 88   Temp 99.2 F (37.3 C) (Oral)   Resp 18   Ht 5\' 7"  (1.702 m)   Wt 131.5 kg   LMP 08/16/2016   SpO2 99%   BMI 45.42 kg/m   Physical Exam  Constitutional: Vital signs are normal. She appears well-developed and well-nourished. She is active.  HENT:  Head: Normocephalic and atraumatic.  Right Ear: Tympanic membrane, external ear and ear canal normal.  Left Ear: Tympanic membrane, external ear and ear canal normal.  Nose: Nose normal.  Mouth/Throat: Uvula is midline, oropharynx is clear and moist and mucous membranes are normal.  Eyes: Conjunctivae, EOM and lids are normal. Pupils are equal, round, and reactive to light.  Neck: Trachea normal, normal range of motion and phonation normal. Neck supple. Carotid bruit is not present.  Cardiovascular: Normal rate, regular rhythm and normal pulses.   Abdominal: Soft. Normal appearance and bowel sounds are normal.  Musculoskeletal: She exhibits tenderness.  There continues to be a ulcer/paronychia that is draining purulent drainage of the right first toe. The first and second toes are swollen area there is increased redness at the base of the first and second toe on. There is redness extending into the dorsum of the right foot. There no lesions noted between the toes. There no puncture wounds noted of the plantar surface of the foot. There is edema from the foot to the mid anterior tibial area. The foot and ankle are warm but not hot.  Lymphadenopathy:       Head (right  side): No submental, no preauricular and no posterior auricular adenopathy present.       Head (left side): No submental, no preauricular and no posterior auricular adenopathy present.    She has no cervical adenopathy.  Neurological: She is alert. She has normal strength. No cranial nerve deficit or sensory deficit. GCS eye subscore is 4. GCS verbal subscore is 5. GCS motor subscore is 6.  Skin: Skin is warm and dry.  Psychiatric: Her speech is normal.         ED Treatments / Results  Labs (all labs ordered are listed, but only abnormal results are displayed) Labs Reviewed - No data to display  EKG  EKG Interpretation None       Radiology No results found.  Procedures Procedures (including critical care time)  Medications Ordered in  ED Medications  cefTRIAXone (ROCEPHIN) injection 1 g (1 g Intramuscular Given 09/06/16 1213)  lidocaine (PF) (XYLOCAINE) 1 % injection (2.1 mLs  Given 09/06/16 1213)     Initial Impression / Assessment and Plan / ED Course  I have reviewed the triage vital signs and the nursing notes.  Pertinent labs & imaging results that were available during my care of the patient were reviewed by me and considered in my medical decision making (see chart for details).  Clinical Course    *I have reviewed nursing notes, vital signs, and all appropriate lab and imaging results for this patient.**  Final Clinical Impressions(s) / ED Diagnoses  Patient's temperature is now 99.2, the remainder the vital signs within normal limits. I encouraged the patient to consider admission for additional IV antibiotics. The patient refuses, but she will receive an IM injection while she is here in the emergency department. Patient given intramuscular Rocephin and asked to continue her current antibiotics. She will continue her current antibiotics. Nonstick dressing was applied. We discussed the need for soaks, for elevation, and for applying a dressing. The patient is  to have her site reexamined in 48-72 hours.    Final diagnoses:  None    New Prescriptions New Prescriptions   No medications on file     Ivery QualeHobson Christie Viscomi, PA-C 09/09/16 1932    Raeford RazorStephen Kohut, MD 09/10/16 [phone]

## 2016-09-09 ENCOUNTER — Encounter: Payer: Self-pay | Admitting: Physician Assistant

## 2016-09-09 ENCOUNTER — Ambulatory Visit: Payer: Self-pay | Admitting: Physician Assistant

## 2016-09-09 VITALS — BP 130/66 | HR 81 | Temp 97.7°F | Ht 66.0 in | Wt 338.5 lb

## 2016-09-09 DIAGNOSIS — E1142 Type 2 diabetes mellitus with diabetic polyneuropathy: Secondary | ICD-10-CM | POA: Insufficient documentation

## 2016-09-09 DIAGNOSIS — E118 Type 2 diabetes mellitus with unspecified complications: Principal | ICD-10-CM

## 2016-09-09 DIAGNOSIS — E1165 Type 2 diabetes mellitus with hyperglycemia: Secondary | ICD-10-CM | POA: Insufficient documentation

## 2016-09-09 DIAGNOSIS — Z13220 Encounter for screening for lipoid disorders: Secondary | ICD-10-CM

## 2016-09-09 DIAGNOSIS — L03119 Cellulitis of unspecified part of limb: Secondary | ICD-10-CM

## 2016-09-09 DIAGNOSIS — E119 Type 2 diabetes mellitus without complications: Secondary | ICD-10-CM | POA: Insufficient documentation

## 2016-09-09 NOTE — Progress Notes (Signed)
BP 130/66 (BP Location: Left Arm, Patient Position: Sitting, Cuff Size: Large)   Pulse 81   Temp 97.7 F (36.5 C)   Ht 5\' 6"  (1.676 m)   Wt (!) 338 lb 8 oz (153.5 kg)   LMP 08/16/2016   SpO2 97%   BMI 54.64 kg/m    Subjective:    Patient ID: Rachel George, female    DOB: 12/08/1980, 35 y.o.   MRN: [id number]  HPI: Rachel George is a 35 y.o. female presenting on 09/09/2016 for No chief complaint on file.   HPI   Pt previously went to [facility] but hasn't been there since last year.  She says she ran out of her insulin about 2 wk ago.  She works as Lawyer in assisted living facility  She doesn't have blood sugar meter but uses her parents' meters.   pt reports having macular degeneration. States last seen by ophthalogist in august. Says was referred to specialist but couldn't afford to go  Pt has been to the ER twice with cellulitis R great toe. She says it is doing much better and the swelling has gone down a lot. She is not having fevers  Relevant past medical, surgical, family and social history reviewed and updated as indicated. Interim medical history since our last visit reviewed. Allergies and medications reviewed and updated.   Current Outpatient Prescriptions:  .  albuterol (PROVENTIL HFA;VENTOLIN HFA) 108 (90 Base) MCG/ACT inhaler, Inhale 2 puffs into the lungs every 6 (six) hours as needed for wheezing or shortness of breath., Disp: , Rfl:  .  amoxicillin-clavulanate (AUGMENTIN) 875-125 MG tablet, Take 1 tablet by mouth every 12 (twelve) hours., Disp: 14 tablet, Rfl: 0 .  bismuth subsalicylate (PEPTO BISMOL) 262 MG/15ML suspension, Take 30 mLs by mouth every 6 (six) hours as needed., Disp: , Rfl:  .  diphenhydrAMINE (BENADRYL) 25 MG tablet, Take 1 tablet (25 mg total) by mouth every 6 (six) hours. (Patient taking differently: Take 25 mg by mouth every 6 (six) hours as needed for allergies. ), Disp: 20 tablet, Rfl: 0 .  diphenoxylate-atropine (LOMOTIL) 2.5-0.025 MG  tablet, Take 1 tablet by mouth 4 (four) times daily as needed for diarrhea or loose stools., Disp: 30 tablet, Rfl: 0 .  loperamide (IMODIUM) 2 MG capsule, Take 2 mg by mouth as needed for diarrhea or loose stools., Disp: , Rfl:  .  metFORMIN (GLUCOPHAGE) 500 MG tablet, Take 1 tablet (500 mg total) by mouth 2 (two) times daily with a meal., Disp: 28 tablet, Rfl: 1 .  ondansetron (ZOFRAN ODT) 4 MG disintegrating tablet, Take 1 tablet (4 mg total) by mouth every 8 (eight) hours as needed for nausea., Disp: 6 tablet, Rfl: 0 .  tetrahydrozoline-zinc (VISINE-AC) 0.05-0.25 % ophthalmic solution, Place 2 drops into both eyes 3 (three) times daily as needed., Disp: , Rfl:  .  traMADol (ULTRAM) 50 MG tablet, Take 1 tablet (50 mg total) by mouth every 6 (six) hours as needed., Disp: 15 tablet, Rfl: 0 .  insulin aspart (NOVOLOG) 100 UNIT/ML injection, Inject 5 Units into the skin 3 (three) times daily before meals. Reported on 04/16/2016, Disp: , Rfl:  .  insulin detemir (LEVEMIR) 100 UNIT/ML injection, Inject 25 Units into the skin 2 (two) times daily. Reported on 04/16/2016, Disp: , Rfl:    Review of Systems  Constitutional: Negative for appetite change, chills, diaphoresis, fatigue, fever and unexpected weight change.  HENT: Negative for congestion, dental problem, drooling, ear pain, facial swelling, hearing loss,  mouth sores, sneezing, sore throat, trouble swallowing and voice change.   Eyes: Positive for pain and redness. Negative for discharge, itching and visual disturbance.  Respiratory: Negative for cough, choking, shortness of breath and wheezing.   Cardiovascular: Positive for leg swelling. Negative for chest pain and palpitations.  Gastrointestinal: Negative for abdominal pain, blood in stool, constipation, diarrhea and vomiting.  Endocrine: Negative for cold intolerance, heat intolerance and polydipsia.  Genitourinary: Negative for decreased urine volume, dysuria and hematuria.  Musculoskeletal:  Positive for arthralgias and gait problem. Negative for back pain.  Skin: Negative for rash.  Allergic/Immunologic: Negative for environmental allergies.  Neurological: Negative for seizures, syncope, light-headedness and headaches.  Hematological: Negative for adenopathy.  Psychiatric/Behavioral: Positive for dysphoric mood. Negative for agitation and suicidal ideas. The patient is nervous/anxious.     Per HPI unless specifically indicated above     Objective:    BP 130/66 (BP Location: Left Arm, Patient Position: Sitting, Cuff Size: Large)   Pulse 81   Temp 97.7 F (36.5 C)   Ht 5\' 6"  (1.676 m)   Wt (!) 338 lb 8 oz (153.5 kg)   LMP 08/16/2016   SpO2 97%   BMI 54.64 kg/m   Wt Readings from Last 3 Encounters:  09/09/16 (!) 338 lb 8 oz (153.5 kg)  09/06/16 290 lb (131.5 kg)  09/04/16 290 lb (131.5 kg)    Physical Exam  Constitutional: She is oriented to person, place, and time. She appears well-developed and well-nourished.  HENT:  Head: Normocephalic and atraumatic.  Mouth/Throat: Oropharynx is clear and moist. No oropharyngeal exudate.  Eyes: Conjunctivae and EOM are normal. Pupils are equal, round, and reactive to light.  Neck: Neck supple. No thyromegaly present.  Cardiovascular: Normal rate and regular rhythm.   Pulmonary/Chest: Effort normal and breath sounds normal.  Abdominal: Soft. Bowel sounds are normal. She exhibits no mass. There is no hepatosplenomegaly. There is no tenderness.  Musculoskeletal: She exhibits no edema.       Right ankle: She exhibits swelling.       Right foot: There is swelling.  Mild swelling R foot and ankle. Not hot to the touch.  ucler on R great toe without redness. Appears to be healing.  The R great toenail is very thick and growing outward - looks more like horn than nail. The skin on both feet is very thick and callused. There is no maceration in the webspaces.  Lymphadenopathy:    She has no cervical adenopathy.  Neurological: She  is alert and oriented to person, place, and time. She displays no tremor. Coordination and gait normal.  Sensation both feet present but diminished when tested with monofilament  Skin: Skin is warm and dry.  Psychiatric: She has a normal mood and affect. Her behavior is normal.  Vitals reviewed.   Results for orders placed or performed during the hospital encounter of 09/04/16  CBC with Differential  Result Value Ref Range   WBC 7.9 4.0 - 10.5 K/uL   RBC 5.19 (H) 3.87 - 5.11 MIL/uL   Hemoglobin 14.0 12.0 - 15.0 g/dL   HCT 16.142.2 09.636.0 - 04.546.0 %   MCV 81.3 78.0 - 100.0 fL   MCH 27.0 26.0 - 34.0 pg   MCHC 33.2 30.0 - 36.0 g/dL   RDW 40.914.1 81.111.5 - 91.415.5 %   Platelets 199 150 - 400 K/uL   Neutrophils Relative % 37 %   Neutro Abs 2.9 1.7 - 7.7 K/uL   Lymphocytes Relative 55 %  Lymphs Abs 4.3 (H) 0.7 - 4.0 K/uL   Monocytes Relative 6 %   Monocytes Absolute 0.5 0.1 - 1.0 K/uL   Eosinophils Relative 2 %   Eosinophils Absolute 0.2 0.0 - 0.7 K/uL   Basophils Relative 0 %   Basophils Absolute 0.0 0.0 - 0.1 K/uL  Basic metabolic panel  Result Value Ref Range   Sodium 136 135 - 145 mmol/L   Potassium 4.0 3.5 - 5.1 mmol/L   Chloride 105 101 - 111 mmol/L   CO2 25 22 - 32 mmol/L   Glucose, Bld 219 (H) 65 - 99 mg/dL   BUN 11 6 - 20 mg/dL   Creatinine, Ser 1.61 0.44 - 1.00 mg/dL   Calcium 8.7 (L) 8.9 - 10.3 mg/dL   GFR calc non Af Amer >60 >60 mL/min   GFR calc Af Amer >60 >60 mL/min   Anion gap 6 5 - 15  POC CBG, ED  Result Value Ref Range   Glucose-Capillary 218 (H) 65 - 99 mg/dL   Comment 1 Document in Chart       Assessment & Plan:   Encounter Diagnoses  Name Primary?  Marland Kitchen Uncontrolled type 2 diabetes mellitus with complication, unspecified long term insulin use status (HCC) Yes  . Cellulitis of foot   . Screening cholesterol level   . Morbid obesity (HCC)   . Diabetic polyneuropathy associated with type 2 diabetes mellitus (HCC)      -will Check on podiatrists to see if we can  refer pt with [facility] -pt to finish her augmentin -Record request sent to [facility] for most recent pap and to [facility] for records on macular degeneration -pt to Get baseline labs drawn -Gave insulin samples to pt- levemir and novolog -gave pt Note to return to work tomorrow -pt to attend diabetes education class -follow up one month with bs log.  She is to RTO right away for any worsening of the foot

## 2016-09-09 NOTE — Patient Instructions (Addendum)
Get labs drawn Go to diabetic education class

## 2016-09-12 LAB — LIPID PANEL
CHOLESTEROL: 128 mg/dL (ref 125–200)
HDL: 36 mg/dL — ABNORMAL LOW (ref >=46)
LDL Cholesterol: 67 mg/dL (ref <130)
Total CHOL/HDL Ratio: 3.6 Ratio (ref <=5.0)
Triglycerides: 124 mg/dL (ref <150)
VLDL: 25 mg/dL (ref <30)

## 2016-09-12 LAB — COMPREHENSIVE METABOLIC PANEL
ALBUMIN: 3.4 g/dL — AB (ref 3.6–5.1)
ALK PHOS: 88 U/L (ref 33–115)
ALT: 10 U/L (ref 6–29)
AST: 14 U/L (ref 10–30)
BILIRUBIN TOTAL: 0.3 mg/dL (ref 0.2–1.2)
BUN: 14 mg/dL (ref 7–25)
CALCIUM: 8.9 mg/dL (ref 8.6–10.2)
CO2: 22 mmol/L (ref 20–31)
CREATININE: 0.67 mg/dL (ref 0.50–1.10)
Chloride: 105 mmol/L (ref 98–110)
GLUCOSE: 278 mg/dL — AB (ref 65–99)
Potassium: 4.9 mmol/L (ref 3.5–5.3)
SODIUM: 136 mmol/L (ref 135–146)
Total Protein: 6.3 g/dL (ref 6.1–8.1)

## 2016-09-12 LAB — TSH: TSH: 0.9 mIU/L

## 2016-09-13 LAB — HEMOGLOBIN A1C
Hgb A1c MFr Bld: 11 % — ABNORMAL HIGH
Mean Plasma Glucose: 269 mg/dL

## 2016-09-13 LAB — MICROALBUMIN, URINE: Microalb, Ur: 34.4 mg/dL

## 2016-10-03 ENCOUNTER — Encounter: Payer: Self-pay | Admitting: Physician Assistant

## 2016-10-03 ENCOUNTER — Ambulatory Visit: Payer: Self-pay | Admitting: Physician Assistant

## 2016-10-03 VITALS — BP 134/72 | HR 95 | Temp 97.9°F | Ht 66.0 in | Wt 342.3 lb

## 2016-10-03 DIAGNOSIS — E118 Type 2 diabetes mellitus with unspecified complications: Secondary | ICD-10-CM

## 2016-10-03 DIAGNOSIS — E1142 Type 2 diabetes mellitus with diabetic polyneuropathy: Secondary | ICD-10-CM

## 2016-10-03 DIAGNOSIS — E1165 Type 2 diabetes mellitus with hyperglycemia: Secondary | ICD-10-CM

## 2016-10-03 DIAGNOSIS — J069 Acute upper respiratory infection, unspecified: Secondary | ICD-10-CM

## 2016-10-03 DIAGNOSIS — L03031 Cellulitis of right toe: Secondary | ICD-10-CM

## 2016-10-03 MED ORDER — CEFTRIAXONE SODIUM 1 G IJ SOLR
1.0000 g | INTRAMUSCULAR | Status: DC
  Administered 2016-10-03: 1 g via INTRAMUSCULAR

## 2016-10-03 MED ORDER — AMOXICILLIN-POT CLAVULANATE 875-125 MG PO TABS: 1.0000 | ORAL_TABLET | Freq: Two times a day (BID) | ORAL | 0 refills | Status: DC

## 2016-10-03 MED ORDER — DICLOFENAC SODIUM 75 MG PO TBEC: 75.0000 mg | DELAYED_RELEASE_TABLET | Freq: Two times a day (BID) | ORAL | 0 refills | Status: DC | PRN

## 2016-10-03 NOTE — Progress Notes (Signed)
BP 134/72 (BP Location: Left Arm, Patient Position: Sitting, Cuff Size: Large)   Pulse 95   Temp 97.9 F (36.6 C) (Other (Comment))   Ht 5\' 6"  (1.676 m)   Wt (!) 342 lb 4.8 oz (155.3 kg)   LMP 09/10/2016 (Approximate)   SpO2 97%   BMI 55.25 kg/m    Subjective:    Patient ID: Rachel George, female    DOB: [birth year], 35 y.o.   MRN: [id number]  HPI: Rachel George is a 35 y.o. female presenting on 10/03/2016 for Nasal Congestion (and chest congestion since last Friday); Cough (productive since Friday, has been taking Mucinex q4h prn); and Toe Pain (reinjured at work when was run over by wheelchair last Sunday)   HPI   Chief Complaint  Patient presents with  . Nasal Congestion    and chest congestion since last Friday  . Cough    productive since Friday, has been taking Mucinex q4h prn  . Toe Pain    reinjured at work when was run over by wheelchair last Sunday   She is a smoker.  She says mucinex helps some  Pt states that she originally injured her R great toe with a fall.  She went to ER and got antibiotics and says the toe improved.  It was doing better at her new pt appointment here a month ago.  She says that she re-injured it at work when a wheelchair ran over her toe last Sunday.  States the toe is painful, discolored and the R lower leg is swelled.   Relevant past medical, surgical, family and social history reviewed and updated as indicated. Interim medical history since our last visit reviewed. Allergies and medications reviewed and updated.   Current Outpatient Prescriptions:  .  albuterol (PROVENTIL HFA;VENTOLIN HFA) 108 (90 Base) MCG/ACT inhaler, Inhale 2 puffs into the lungs every 6 (six) hours as needed for wheezing or shortness of breath., Disp: , Rfl:  .  bismuth subsalicylate (PEPTO BISMOL) 262 MG/15ML suspension, Take 30 mLs by mouth every 6 (six) hours as needed., Disp: , Rfl:  .  diphenhydrAMINE (BENADRYL) 25 MG tablet, Take 1 tablet (25 mg total) by  mouth every 6 (six) hours. (Patient taking differently: Take 25 mg by mouth every 6 (six) hours as needed for allergies. ), Disp: 20 tablet, Rfl: 0 .  diphenoxylate-atropine (LOMOTIL) 2.5-0.025 MG tablet, Take 1 tablet by mouth 4 (four) times daily as needed for diarrhea or loose stools., Disp: 30 tablet, Rfl: 0 .  insulin aspart (NOVOLOG) 100 UNIT/ML injection, Inject 5 Units into the skin 3 (three) times daily before meals. Reported on 04/16/2016, Disp: , Rfl:  .  insulin detemir (LEVEMIR) 100 UNIT/ML injection, Inject 25 Units into the skin 2 (two) times daily. Reported on 04/16/2016, Disp: , Rfl:  .  loperamide (IMODIUM) 2 MG capsule, Take 2 mg by mouth as needed for diarrhea or loose stools., Disp: , Rfl:  .  metFORMIN (GLUCOPHAGE) 500 MG tablet, Take 1 tablet (500 mg total) by mouth 2 (two) times daily with a meal., Disp: 28 tablet, Rfl: 1 .  ondansetron (ZOFRAN ODT) 4 MG disintegrating tablet, Take 1 tablet (4 mg total) by mouth every 8 (eight) hours as needed for nausea., Disp: 6 tablet, Rfl: 0 .  tetrahydrozoline-zinc (VISINE-AC) 0.05-0.25 % ophthalmic solution, Place 2 drops into both eyes 3 (three) times daily as needed., Disp: , Rfl:  .  traMADol (ULTRAM) 50 MG tablet, Take 1 tablet (50 mg total) by mouth  every 6 (six) hours as needed., Disp: 15 tablet, Rfl: 0   Review of Systems  Constitutional: Positive for appetite change and fatigue. Negative for chills, diaphoresis, fever and unexpected weight change.  HENT: Positive for congestion, sneezing, sore throat, trouble swallowing and voice change. Negative for dental problem, drooling, ear pain, facial swelling, hearing loss and mouth sores.   Eyes: Positive for pain and redness. Negative for discharge, itching and visual disturbance.  Respiratory: Positive for cough, shortness of breath and wheezing. Negative for choking.   Cardiovascular: Positive for leg swelling. Negative for chest pain and palpitations.  Gastrointestinal: Negative for  abdominal pain, blood in stool, constipation, diarrhea and vomiting.  Endocrine: Positive for cold intolerance and polydipsia. Negative for heat intolerance.  Genitourinary: Negative for decreased urine volume, dysuria and hematuria.  Musculoskeletal: Positive for arthralgias and gait problem. Negative for back pain.  Skin: Negative for rash.  Allergic/Immunologic: Positive for environmental allergies.  Neurological: Positive for headaches. Negative for seizures, syncope and light-headedness.  Hematological: Negative for adenopathy.  Psychiatric/Behavioral: Positive for agitation and dysphoric mood. Negative for suicidal ideas. The patient is not nervous/anxious.     Per HPI unless specifically indicated above     Objective:    BP 134/72 (BP Location: Left Arm, Patient Position: Sitting, Cuff Size: Large)   Pulse 95   Temp 97.9 F (36.6 C) (Other (Comment))   Ht 5\' 6"  (1.676 m)   Wt (!) 342 lb 4.8 oz (155.3 kg)   LMP 09/10/2016 (Approximate)   SpO2 97%   BMI 55.25 kg/m   Wt Readings from Last 3 Encounters:  10/03/16 (!) 342 lb 4.8 oz (155.3 kg)  09/09/16 (!) 338 lb 8 oz (153.5 kg)  09/06/16 290 lb (131.5 kg)    Physical Exam  Constitutional: She is oriented to person, place, and time. She appears well-developed and well-nourished.  HENT:  Head: Normocephalic and atraumatic.  Right Ear: Hearing, tympanic membrane, external ear and ear canal normal.  Left Ear: Hearing, tympanic membrane, external ear and ear canal normal.  Nose: Nose normal.  Mouth/Throat: Uvula is midline and oropharynx is clear and moist. No oropharyngeal exudate.  Neck: Neck supple.  Cardiovascular: Normal rate and regular rhythm.   Pulses:      Dorsalis pedis pulses are 2+ on the right side.  Pulmonary/Chest: Effort normal and breath sounds normal. She has no wheezes.  Musculoskeletal:       Right lower leg: She exhibits swelling and edema.       Right foot: There is tenderness and swelling.  Right  great toe with swelling, red, black and purulence.   Lymphadenopathy:    She has no cervical adenopathy.  Neurological: She is alert and oriented to person, place, and time.  Skin: Skin is warm and dry.  Psychiatric: She has a normal mood and affect. Her behavior is normal.  Vitals reviewed.  (pictures taken, unable to attach to encounter)     Assessment & Plan:     Encounter Diagnoses  Name Primary?  . Cellulitis of toe of right foot Yes  . Acute upper respiratory infection   . Uncontrolled type 2 diabetes mellitus with complication, unspecified long term insulin use status (HCC)   . Diabetic polyneuropathy associated with type 2 diabetes mellitus (HCC)     -F/u Monday as scheduled. Go to ER if worsens (ie fever, worsening pain, swelling or redness, etc). Pt states understanding -rocephin given in office for cellulitis. rx augmentin.   Referred to wound clinic-appointment  next week -pt to elevate the leg.  She is given note to be out of work for 2 days -Counseled on symptomatic treatment of URI with OTCs

## 2016-10-03 NOTE — Patient Instructions (Addendum)
[facility] [phone] [address], [address]  [place], [place] [id number]  Thurs. Nov. 9, 2017 @ 8:45 AM

## 2016-10-07 ENCOUNTER — Encounter: Payer: Self-pay | Admitting: Physician Assistant

## 2016-10-07 ENCOUNTER — Ambulatory Visit: Payer: Self-pay | Admitting: Physician Assistant

## 2016-10-07 VITALS — BP 124/76 | HR 87 | Temp 97.9°F | Ht 66.0 in | Wt 340.0 lb

## 2016-10-07 DIAGNOSIS — E1142 Type 2 diabetes mellitus with diabetic polyneuropathy: Secondary | ICD-10-CM

## 2016-10-07 DIAGNOSIS — E118 Type 2 diabetes mellitus with unspecified complications: Secondary | ICD-10-CM

## 2016-10-07 DIAGNOSIS — E1165 Type 2 diabetes mellitus with hyperglycemia: Secondary | ICD-10-CM

## 2016-10-07 DIAGNOSIS — L03031 Cellulitis of right toe: Secondary | ICD-10-CM | POA: Insufficient documentation

## 2016-10-07 HISTORY — DX: Cellulitis of right toe: L03.031

## 2016-10-07 MED ORDER — CEFTRIAXONE SODIUM 1 G IJ SOLR
1.0000 g | Freq: Once | INTRAMUSCULAR | Status: AC
  Administered 2016-10-07: 1 g via INTRAMUSCULAR

## 2016-10-07 MED ORDER — SITAGLIPTIN PHOSPHATE 100 MG PO TABS: 100.0000 mg | ORAL_TABLET | Freq: Every day | ORAL | 2 refills | Status: DC

## 2016-10-07 NOTE — Progress Notes (Signed)
BP 124/76 (BP Location: Left Arm, Patient Position: Sitting, Cuff Size: Large)   Pulse 87   Temp 97.9 F (36.6 C) (Other (Comment))   Ht 5\' 6"  (1.676 m)   Wt (!) 340 lb (154.2 kg)   LMP 09/10/2016 (Approximate)   SpO2 98%   BMI 54.88 kg/m    Subjective:    Patient ID: Rachel George, female    DOB: [birth year], 35 y.o.   MRN: [id number]  HPI: Rachel George is a 35 y.o. female presenting on 10/07/2016 for Diabetes (feels right great toe is getting better, swelling has decreased)   HPI   Pt didn't bring her bs log. She says she was looking at it last night and she doesn't remember where she put it.  She says it's been running between 200-300.   She hasn't been to diabetic class yet  Records obtained from eye dr- [facility] to [facility]   [facility]  She is taking her augmentin and is soaking the toe.    Relevant past medical, surgical, family and social history reviewed and updated as indicated. Interim medical history since our last visit reviewed. Allergies and medications reviewed and updated.   Current Outpatient Prescriptions:  .  albuterol (PROVENTIL HFA;VENTOLIN HFA) 108 (90 Base) MCG/ACT inhaler, Inhale 2 puffs into the lungs every 6 (six) hours as needed for wheezing or shortness of breath., Disp: , Rfl:  .  amoxicillin-clavulanate (AUGMENTIN) 875-125 MG tablet, Take 1 tablet by mouth 2 (two) times daily., Disp: 20 tablet, Rfl: 0 .  bismuth subsalicylate (PEPTO BISMOL) 262 MG/15ML suspension, Take 30 mLs by mouth every 6 (six) hours as needed., Disp: , Rfl:  .  diclofenac (VOLTAREN) 75 MG EC tablet, Take 1 tablet (75 mg total) by mouth 2 (two) times daily as needed., Disp: 30 tablet, Rfl: 0 .  diphenhydrAMINE (BENADRYL) 25 MG tablet, Take 1 tablet (25 mg total) by mouth every 6 (six) hours. (Patient taking differently: Take 25 mg by mouth every 6 (six) hours as needed for allergies. ),  Disp: 20 tablet, Rfl: 0 .  diphenoxylate-atropine (LOMOTIL) 2.5-0.025 MG tablet, Take 1 tablet by mouth 4 (four) times daily as needed for diarrhea or loose stools., Disp: 30 tablet, Rfl: 0 .  insulin aspart (NOVOLOG) 100 UNIT/ML injection, Inject 5 Units into the skin 3 (three) times daily before meals. Reported on 04/16/2016, Disp: , Rfl:  .  insulin detemir (LEVEMIR) 100 UNIT/ML injection, Inject 25 Units into the skin 2 (two) times daily. Reported on 04/16/2016, Disp: , Rfl:  .  loperamide (IMODIUM) 2 MG capsule, Take 2 mg by mouth as needed for diarrhea or loose stools., Disp: , Rfl:  .  metFORMIN (GLUCOPHAGE) 500 MG tablet, Take 1 tablet (500 mg total) by mouth 2 (two) times daily with a meal., Disp: 28 tablet, Rfl: 1 .  ondansetron (ZOFRAN ODT) 4 MG disintegrating tablet, Take 1 tablet (4 mg total) by mouth every 8 (eight) hours as needed for nausea., Disp: 6 tablet, Rfl: 0 .  tetrahydrozoline-zinc (VISINE-AC) 0.05-0.25 % ophthalmic solution, Place 2 drops into both eyes 3 (three) times daily as needed., Disp: , Rfl:  .  traMADol (ULTRAM) 50 MG tablet, Take 1 tablet (50 mg total) by mouth every 6 (six) hours as needed., Disp: 15 tablet, Rfl: 0   Review of Systems  Constitutional: Positive for fatigue. Negative for appetite change, chills, diaphoresis, fever and  unexpected weight change.  HENT: Positive for congestion. Negative for dental problem, drooling, ear pain, facial swelling, hearing loss, mouth sores, sneezing, sore throat, trouble swallowing and voice change.   Eyes: Positive for pain, discharge, redness, itching and visual disturbance.  Respiratory: Positive for cough. Negative for choking, shortness of breath and wheezing.   Cardiovascular: Positive for leg swelling. Negative for chest pain and palpitations.  Gastrointestinal: Negative for abdominal pain, blood in stool, constipation, diarrhea and vomiting.  Endocrine: Negative for cold intolerance, heat intolerance and polydipsia.   Genitourinary: Negative for decreased urine volume, dysuria and hematuria.  Musculoskeletal: Positive for arthralgias and gait problem. Negative for back pain.  Skin: Negative for rash.  Allergic/Immunologic: Negative for environmental allergies.  Neurological: Negative for seizures, syncope, light-headedness and headaches.  Hematological: Negative for adenopathy.  Psychiatric/Behavioral: Positive for agitation. Negative for dysphoric mood and suicidal ideas. The patient is nervous/anxious.     Per HPI unless specifically indicated above     Objective:    BP 124/76 (BP Location: Left Arm, Patient Position: Sitting, Cuff Size: Large)   Pulse 87   Temp 97.9 F (36.6 C) (Other (Comment))   Ht 5\' 6"  (1.676 m)   Wt (!) 340 lb (154.2 kg)   LMP 09/10/2016 (Approximate)   SpO2 98%   BMI 54.88 kg/m   Wt Readings from Last 3 Encounters:  10/07/16 (!) 340 lb (154.2 kg)  10/03/16 (!) 342 lb 4.8 oz (155.3 kg)  09/09/16 (!) 338 lb 8 oz (153.5 kg)    Physical Exam  Constitutional: She is oriented to person, place, and time. She appears well-developed and well-nourished.  HENT:  Head: Normocephalic and atraumatic.  Neck: Neck supple.  Cardiovascular: Normal rate and regular rhythm.   Pulmonary/Chest: Effort normal and breath sounds normal.  Abdominal: Soft. Bowel sounds are normal. She exhibits no mass. There is no hepatosplenomegaly. There is no tenderness.  Musculoskeletal: She exhibits edema (RLE).       Right foot: There is swelling.  R great toe still with redness, swelling, purulence, odor.  No changes from exam Thursday.   Lymphadenopathy:    She has no cervical adenopathy.  Neurological: She is alert and oriented to person, place, and time.  Skin: Skin is warm and dry.  Psychiatric: She has a normal mood and affect. Her behavior is normal.  Vitals reviewed.   Results for orders placed or performed in visit on 09/09/16  HgB A1c  Result Value Ref Range   Hgb A1c MFr Bld  11.0 (H) <5.7 %   Mean Plasma Glucose 269 mg/dL  Lipid Profile  Result Value Ref Range   Cholesterol 128 125 - 200 mg/dL   Triglycerides 347124 <425<150 mg/dL   HDL 36 (L) >=95>=46 mg/dL   Total CHOL/HDL Ratio 3.6 <=5.0 Ratio   VLDL 25 <30 mg/dL   LDL Cholesterol 67 <638<130 mg/dL  Comprehensive Metabolic Panel (CMET)  Result Value Ref Range   Sodium 136 135 - 146 mmol/L   Potassium 4.9 3.5 - 5.3 mmol/L   Chloride 105 98 - 110 mmol/L   CO2 22 20 - 31 mmol/L   Glucose, Bld 278 (H) 65 - 99 mg/dL   BUN 14 7 - 25 mg/dL   Creat 7.560.67 4.330.50 - 2.951.10 mg/dL   Total Bilirubin 0.3 0.2 - 1.2 mg/dL   Alkaline Phosphatase 88 33 - 115 U/L   AST 14 10 - 30 U/L   ALT 10 6 - 29 U/L   Total Protein 6.3 6.1 -  8.1 g/dL   Albumin 3.4 (L) 3.6 - 5.1 g/dL   Calcium 8.9 8.6 - 16.1 mg/dL  Microalbumin, urine  Result Value Ref Range   Microalb, Ur 34.4 Not estab mg/dL  TSH  Result Value Ref Range   TSH 0.90 mIU/L      Assessment & Plan:    Encounter Diagnoses  Name Primary?  . Cellulitis of toe of right foot Yes  . Uncontrolled type 2 diabetes mellitus with complication, unspecified long term insulin use status (HCC)   . Diabetic polyneuropathy associated with type 2 diabetes mellitus (HCC)   . Morbid obesity (HCC)     -reviewed labs with pt -Signed up for [facility]. Order Al[facility]ly. (samples given) -pt to Continue her metformin and both insulins. -she is to Monitor and bring bs log for f/u OV -rocephin given today and xray ordered. Pt has appointment with wound clinic on Thursday.  Discussed with pt infection worrisome for loosing the toe. She agrees. Continue augmentin. Counseled pt to Stop Soaking the toe.  -Wound clinic thursday as scheduled.  -F/u 1 week.  Pt told to RTO or go to ER for worsening or new symptoms like fever

## 2016-10-07 NOTE — Patient Instructions (Signed)
Xray today Continue antibiotics Do not soak the foot Wound care appointment Thursday as scheduled Start januvia once daily

## 2016-10-08 ENCOUNTER — Ambulatory Visit (HOSPITAL_COMMUNITY)
Admission: RE | Admit: 2016-10-08 | Discharge: 2016-10-08 | Disposition: A | Discharge status: Home / Self Care | Payer: Self-pay | Source: Ambulatory Visit | Attending: Physician Assistant | Admitting: Physician Assistant

## 2016-10-08 DIAGNOSIS — L03031 Cellulitis of right toe: Secondary | ICD-10-CM | POA: Insufficient documentation

## 2016-10-09 ENCOUNTER — Ambulatory Visit: Payer: Self-pay | Admitting: Physician Assistant

## 2016-10-09 ENCOUNTER — Encounter: Payer: Self-pay | Admitting: Physician Assistant

## 2016-10-09 VITALS — BP 124/72 | HR 98 | Temp 97.2°F | Ht 66.0 in | Wt 340.0 lb

## 2016-10-09 DIAGNOSIS — E118 Type 2 diabetes mellitus with unspecified complications: Secondary | ICD-10-CM

## 2016-10-09 DIAGNOSIS — E1165 Type 2 diabetes mellitus with hyperglycemia: Secondary | ICD-10-CM

## 2016-10-09 DIAGNOSIS — E1142 Type 2 diabetes mellitus with diabetic polyneuropathy: Secondary | ICD-10-CM

## 2016-10-09 DIAGNOSIS — L03031 Cellulitis of right toe: Secondary | ICD-10-CM

## 2016-10-09 MED ORDER — CEFTRIAXONE SODIUM 1 G IJ SOLR
1.0000 g | Freq: Once | INTRAMUSCULAR | Status: AC
  Administered 2016-10-09: 1 g via INTRAMUSCULAR

## 2016-10-09 NOTE — Progress Notes (Signed)
BP 124/72 (BP Location: Left Arm, Patient Position: Sitting, Cuff Size: Large)   Pulse 98   Temp 97.2 F (36.2 C)   Ht 5\' 6"  (1.676 m)   Wt (!) 340 lb (154.2 kg)   LMP 09/10/2016 (Approximate)   SpO2 98%   BMI 54.88 kg/m    Subjective:    Patient ID: Rachel BarkerKaren Galligan, female    DOB: 1981-02-01, 35 y.o.   MRN: [id number]  HPI: Rachel George is a 35 y.o. female presenting on 10/09/2016 for Follow-up (toe)   HPI   Pt believes toe is doing better.  She thinks the swelling and odor are down.  She isn't having much pain.  She is taking her augmentin.    Pt is very intelligent about her diabetes and the importance of taking care of this toe infection.   Relevant past medical, surgical, family and social history reviewed and updated as indicated. Interim medical history since our last visit reviewed. Allergies and medications reviewed and updated.   Current Outpatient Prescriptions:  .  albuterol (PROVENTIL HFA;VENTOLIN HFA) 108 (90 Base) MCG/ACT inhaler, Inhale 2 puffs into the lungs every 6 (six) hours as needed for wheezing or shortness of breath., Disp: , Rfl:  .  amoxicillin-clavulanate (AUGMENTIN) 875-125 MG tablet, Take 1 tablet by mouth 2 (two) times daily., Disp: 20 tablet, Rfl: 0 .  bismuth subsalicylate (PEPTO BISMOL) 262 MG/15ML suspension, Take 30 mLs by mouth every 6 (six) hours as needed., Disp: , Rfl:  .  diclofenac (VOLTAREN) 75 MG EC tablet, Take 1 tablet (75 mg total) by mouth 2 (two) times daily as needed., Disp: 30 tablet, Rfl: 0 .  diphenhydrAMINE (BENADRYL) 25 MG tablet, Take 1 tablet (25 mg total) by mouth every 6 (six) hours. (Patient taking differently: Take 25 mg by mouth every 6 (six) hours as needed for allergies. ), Disp: 20 tablet, Rfl: 0 .  diphenoxylate-atropine (LOMOTIL) 2.5-0.025 MG tablet, Take 1 tablet by mouth 4 (four) times daily as needed for diarrhea or loose stools., Disp: 30 tablet, Rfl: 0 .  insulin aspart (NOVOLOG) 100 UNIT/ML injection,  Inject 5 Units into the skin 3 (three) times daily before meals. Reported on 04/16/2016, Disp: , Rfl:  .  insulin detemir (LEVEMIR) 100 UNIT/ML injection, Inject 25 Units into the skin 2 (two) times daily. Reported on 04/16/2016, Disp: , Rfl:  .  loperamide (IMODIUM) 2 MG capsule, Take 2 mg by mouth as needed for diarrhea or loose stools., Disp: , Rfl:  .  metFORMIN (GLUCOPHAGE) 500 MG tablet, Take 1 tablet (500 mg total) by mouth 2 (two) times daily with a meal., Disp: 28 tablet, Rfl: 1 .  ondansetron (ZOFRAN ODT) 4 MG disintegrating tablet, Take 1 tablet (4 mg total) by mouth every 8 (eight) hours as needed for nausea., Disp: 6 tablet, Rfl: 0 .  sitaGLIPtin (JANUVIA) 100 MG tablet, Take 1 tablet (100 mg total) by mouth daily., Disp: 90 tablet, Rfl: 2 .  tetrahydrozoline-zinc (VISINE-AC) 0.05-0.25 % ophthalmic solution, Place 2 drops into both eyes 3 (three) times daily as needed., Disp: , Rfl:  .  traMADol (ULTRAM) 50 MG tablet, Take 1 tablet (50 mg total) by mouth every 6 (six) hours as needed., Disp: 15 tablet, Rfl: 0  Review of Systems  Per HPI unless specifically indicated above     Objective:    BP 124/72 (BP Location: Left Arm, Patient Position: Sitting, Cuff Size: Large)   Pulse 98   Temp 97.2 F (36.2 C)  Ht 5\' 6"  (1.676 m)   Wt (!) 340 lb (154.2 kg)   LMP 09/10/2016 (Approximate)   SpO2 98%   BMI 54.88 kg/m   Wt Readings from Last 3 Encounters:  10/09/16 (!) 340 lb (154.2 kg)  10/07/16 (!) 340 lb (154.2 kg)  10/03/16 (!) 342 lb 4.8 oz (155.3 kg)    Physical Exam  Constitutional: She is oriented to person, place, and time. She appears well-developed and well-nourished.  HENT:  Head: Normocephalic and atraumatic.  Pulmonary/Chest: Effort normal.  Musculoskeletal: She exhibits edema (RLE).       Right foot: There is swelling.  Toe swelled with odor and infection.  No significant change from Monday.  Odor seems to be less today.  No drainage.  Pt does have some sensation  in the toe (it hurts a bit during exam)  Neurological: She is alert and oriented to person, place, and time.  Psychiatric: She has a normal mood and affect. Her behavior is normal. Thought content normal.  Nursing note and vitals reviewed.       Assessment & Plan:    Encounter Diagnoses  Name Primary?  . Cellulitis of toe of right foot Yes  . Uncontrolled type 2 diabetes mellitus with complication, unspecified long term insulin use status (HCC)   . Diabetic polyneuropathy associated with type 2 diabetes mellitus (HCC)      -rocephin given in office today -reviewed xray results -pt to continue augmentin -pt has appointment at wound clinic tomorrow -pt has follow up here on monday

## 2016-10-10 ENCOUNTER — Encounter: Payer: Self-pay | Attending: Surgery | Admitting: Surgery

## 2016-10-10 DIAGNOSIS — F17218 Nicotine dependence, cigarettes, with other nicotine-induced disorders: Secondary | ICD-10-CM | POA: Insufficient documentation

## 2016-10-10 DIAGNOSIS — L97512 Non-pressure chronic ulcer of other part of right foot with fat layer exposed: Secondary | ICD-10-CM | POA: Insufficient documentation

## 2016-10-10 DIAGNOSIS — I10 Essential (primary) hypertension: Secondary | ICD-10-CM | POA: Insufficient documentation

## 2016-10-10 DIAGNOSIS — E1140 Type 2 diabetes mellitus with diabetic neuropathy, unspecified: Secondary | ICD-10-CM | POA: Insufficient documentation

## 2016-10-10 DIAGNOSIS — E11621 Type 2 diabetes mellitus with foot ulcer: Secondary | ICD-10-CM | POA: Insufficient documentation

## 2016-10-11 NOTE — Progress Notes (Signed)
Rachel George, Rachel George ([id number]) Visit Report for 10/10/2016 Chief Complaint Document Details Patient Name: Rachel George Date of Service: 10/10/2016 8:45 AM Medical Record Patient Account Number: [id number] Number: Afful, RN, BSN, Treating RN: 1981-08-02 (35 y.o. [place] Date of Birth/Sex: Female) Other Clinician: Primary Care Physician: Jacquelin Hawking Treating Jaloni Sorber Referring Physician: Jacquelin Hawking Physician/Extender: Weeks in Treatment: 0 Information Obtained from: Patient Chief Complaint Patients presents for treatment of an open diabetic ulcer the right big toe which she's had for about 6 weeks Electronic Signature(s) Signed: 10/10/2016 10:14:02 AM By: Evlyn Kanner MD, FACS Entered By: Evlyn Kanner on 10/10/2016 10:14:02 Rachel George ([id number]) -------------------------------------------------------------------------------- Debridement Details Patient Name: Rachel George Date of Service: 10/10/2016 8:45 AM Medical Record Patient Account Number: [id number] Number: Afful, RN, BSN, Treating RN: 1981/03/31 (35 y.o. [place] S[place] Date of Birth/Sex: Female) Other Clinician: Primary Care Physician: Jacquelin Hawking Treating Markie Frith Referring Physician: Jacquelin Hawking Physician/Extender: Tania Ade in Treatment: 0 Debridement Performed for Wound #1 Right,Dorsal Toe Great Assessment: Performed By: Physician Evlyn Kanner, MD Debridement: Debridement Pre-procedure Yes - 09:39 Verification/Time Out Taken: Start Time: 09:39 Pain Control: Lidocaine 4% Topical Solution Level: Skin/Subcutaneous Tissue Total Area Debrided (L x 5 (cm) x 6 (cm) = 30 (cm) W): Tissue and other Viable, Non-Viable, Callus, Exudate, Fibrin/Slough, Skin, Subcutaneous material debrided: Instrument: Forceps, Scissors Bleeding: None End Time: 09:47 Procedural Pain: Insensate Post Procedural Pain: Insensate Response to Treatment: Procedure was tolerated well Post  Debridement Measurements of Total Wound Length: (cm) 5 Width: (cm) 6 Depth: (cm) 0.2 Volume: (cm) 4.712 Character of Wound/Ulcer Post Stable Debridement: Severity of Tissue Post Debridement: Fat layer exposed Post Procedure Diagnosis Same as Pre-procedure Electronic Signature(s) Signed: 10/10/2016 10:13:36 AM By: Evlyn Kanner MD, FACS Signed: 10/10/2016 5:10:52 PM By: Elpidio Eric BSN, RN Entered By: Evlyn Kanner on 10/10/2016 10:13:36 Rachel George, Rachel George ([id number]) Rachel George, Rachel George ([id number]) -------------------------------------------------------------------------------- HPI Details Patient Name: Rachel George Date of Service: 10/10/2016 8:45 AM Medical Record Patient Account Number: [id number] Number: Afful, RN, BSN, Treating RN: 05-26-1981 (35 y.o. [place] Sink Date of Birth/Sex: Female) Other Clinician: Primary Care Physician: Jacquelin Hawking Treating Julis Haubner Referring Physician: Jacquelin Hawking Physician/Extender: Weeks in Treatment: 0 History of Present Illness Location: had a fall and injured her right lower extremity and right big toe Quality: Patient reports experiencing a dull pain to affected area(s). Severity: Patient states wound are getting worse. Duration: Patient has had the wound for < 6 weeks prior to presenting for treatment Timing: Pain in wound is Intermittent (comes and goes Context: The wound occurred when the patient had a fall in early October and injured her right ankle right leg and her toenail Modifying Factors: Other treatment(s) tried include:the ortho surgeons at [facility] and her PCP Associated Signs and Symptoms: Patient reports having foul odor. HPI Description: 35 year old patient who has been a diabetic for a long while was recently seen several times by her PCPs office for a cellulitis and ulcer of her right great toe which has been treated in the ER and in the clinic. that she has had hyperglycemia for a long while due to  noncompliance and she is also morbidly obese with diabetic polyneuropathy. She has been on Augmentin for a while and continues to be a smoker. Xray of the right foot done on 10/08/2016 -- IMPRESSION:Soft tissue swelling and subcutaneous air within the right great toe compatible with ongoing cellulitis.No other acute osseous finding or evidence of definite osteomyelitis by plain radiography. No radiopaque foreign body. Last Hemoglobin A1c done on 09/12/2016 --  was 11%. She has had treatment for her ankle and leg and has had old fractures there which are being treated conservatively and she will follow-up with her orthopedic specialist at [facility]. Electronic Signature(s) Signed: 10/10/2016 10:15:55 AM By: Evlyn Kanner MD, FACS Previous Signature: 10/10/2016 9:16:54 AM Version By: Evlyn Kanner MD, FACS Previous Signature: 10/10/2016 9:14:55 AM Version By: Evlyn Kanner MD, FACS Entered By: Evlyn Kanner on 10/10/2016 10:15:54 Rachel George ([id number]) -------------------------------------------------------------------------------- Physical Exam Details Patient Name: Rachel George Date of Service: 10/10/2016 8:45 AM Medical Record Patient Account Number: [id number] Number: Afful, RN, BSN, Treating RN: 06-07-1981 (35 y.o. [place] Sink Date of Birth/Sex: Female) Other Clinician: Primary Care Physician: Jacquelin Hawking Treating Keria Widrig Referring Physician: Jacquelin Hawking Physician/Extender: Weeks in Treatment: 0 Constitutional . Pulse regular. Respirations normal and unlabored. Afebrile. . Eyes Nonicteric. Reactive to light. Ears, Nose, Mouth, and Throat Lips, teeth, and gums WNL.Marland Kitchen Moist mucosa without lesions. Neck supple and nontender. No palpable supraclavicular or cervical adenopathy. Normal sized without goiter. Respiratory WNL. No retractions.. Cardiovascular Pedal Pulses WNL. ABI the left was 1.08 on the right was 1.15. he has edema of the right  lower extremity right ankle and foot due to a orthopedic related injury about 6 weeks ago.. Gastrointestinal (GI) Abdomen without masses or tenderness.. No liver or spleen enlargement or tenderness.. Lymphatic No adneopathy. No adenopathy. No adenopathy. Musculoskeletal Adexa without tenderness or enlargement.. Digits and nails w/o clubbing, cyanosis, infection, petechiae, ischemia, or inflammatory conditions.. Integumentary (Hair, Skin) No suspicious lesions. No crepitus or fluctuance. No peri-wound warmth or erythema. No masses.Marland Kitchen Psychiatric Judgement and insight Intact.. No evidence of depression, anxiety, or agitation.. Notes the patient's right toe has a large thickened area of necrotic debris and her nailbed is also necrotic with fungal infection. There is no evidence of gross cellulitis but there is edema of the ankle and foot due to an orthopedic related injury. Using forceps and scissors I have debrided the entire roof of the right big toe including the nail which was necrotic. There is some subcutaneous thickened macerated skin possibly due to fluid collection over the month after injury. There is no evidence of gross cellulitis or pus. Electronic Signature(s) Signed: 10/10/2016 10:17:45 AM By: Evlyn Kanner MD, FACS [place], Clydie Braun ([id number]) Entered By: Evlyn Kanner on 10/10/2016 10:17:44 Rachel George, Rachel George ([id number]) -------------------------------------------------------------------------------- Physician Orders Details Patient Name: Rachel George Date of Service: 10/10/2016 8:45 AM Medical Record Patient Account Number: [id number] Number: Afful, RN, BSN, Treating RN: Aug 25, 1981 (35 y.o. [place] Sink Date of Birth/Sex: Female) Other Clinician: Primary Care Physician: Jacquelin Hawking Treating Tylisa Alcivar Referring Physician: Jacquelin Hawking Physician/Extender: Tania Ade in Treatment: 0 Verbal / Phone Orders: Yes Clinician: Afful, RN, BSN, Rita Read Back and  Verified: Yes Diagnosis Coding ICD-10 Coding Code Description E11.621 Type 2 diabetes mellitus with foot ulcer 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t with fat layer exposed E66.01 Morbid (severe) obesity due to excess calories F17.218 Nicotine dependence, cigarettes, with other nicotine-induced disorders Wound Cleansing Wound #1 Right,Dorsal Toe Great o Cleanse wound with mild soap and water o May Shower, gently pat wound dry prior to applying new dressing. Anesthetic Wound #1 Right,Dorsal Toe Great o Topical Lidocaine 4% cream applied to wound bed prior to debridement Primary Wound Dressing Wound #1 Right,Dorsal Toe Great o Sorbalgon Ag - or equivalent Secondary Dressing Wound #1 Right,Dorsal Toe Great o Gauze and Kerlix/Conform Dressing Change Frequency Wound #1 Right,Dorsal Toe Great o Change dressing every day. Follow-up Appointments Wound #1 Right,Dorsal  Toe Great o Return Appointment in 1 week. Rachel George, Rachel George ([id number]) Off-Loading Wound #1 Right,Dorsal Toe Great o Open toe surgical shoe with peg assist. Additional Orders / Instructions Wound #1 Right,Dorsal Toe Great o Stop Smoking o Increase protein intake. o Activity as tolerated o Other: - Zinc, MVI, Vit A, C Electronic Signature(s) Signed: 10/10/2016 5:08:19 PM By: Evlyn Kanner MD, FACS Signed: 10/10/2016 5:10:52 PM By: Elpidio Eric BSN, RN Entered By: Elpidio Eric on 10/10/2016 09:50:42 Rachel George, Rachel George ([id number]) -------------------------------------------------------------------------------- Problem List Details Patient Name: Rachel George Date of Service: 10/10/2016 8:45 AM Medical Record Patient Account Number: [id number] Number: Afful, RN, BSN, Treating RN: 1981-07-13 (35 y.o. 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t with 10/10/2016 Yes fat layer exposed E66.01 Morbid (severe) obesity due to excess calories 10/10/2016 Yes F17.218 Nicotine dependence, cigarettes, with other nicotine- 10/10/2016 Yes induced disorders Inactive Problems Resolved Problems Electronic Signature(s) Signed: 10/10/2016 10:12:59 AM By: Evlyn Kanner MD, FACS Previous Signature: 10/10/2016 9:17:38 AM Version By: Evlyn Kanner MD, FACS Entered By: Evlyn Kanner on 10/10/2016 10:12:59 Rachel George ([id number]) -------------------------------------------------------------------------------- Progress Note Details Patient Name: Rachel George Date of Service: 10/10/2016 8:45 AM Medical Record Patient Account Number: [id number] Number: Afful, RN, BSN, Treating RN: [birth year] (35 y.o. [place] Sink Date of Birth/Sex: Female) Other Clinician: Primary Care Physician: Jacquelin Hawking Treating Ricky Doan Referring Physician: Jacquelin Hawking Physician/Extender: Weeks in Treatment: 0 Subjective Chief Complaint Information obtained from Patient Patients presents for treatment of an open diabetic ulcer the right big toe which she's had for about 6 weeks History of Present Illness (HPI) The following HPI elements were documented for the patient's wound: Location: had a fall and injured her right lower extremity and right big toe Quality: Patient reports experiencing a dull pain to affected area(s). Severity: Patient states wound are getting worse. Duration: Patient has had the wound for < 6 weeks prior to presenting for treatment Timing: Pain in wound is Intermittent (comes and goes Context: The wound occurred when the patient had a fall in early October and injured her right  ankle right leg and her toenail Modifying Factors: Other treatment(s) tried include:the ortho surgeons at [facility] and her PCP Associated Signs and Symptoms: Patient reports having foul odor. 35 year old patient who has been a diabetic for a long while was recently seen several times by her PCPs office for a cellulitis and ulcer of her right great toe which has been treated in the ER and in the clinic. that she has had hyperglycemia for a long while due to noncompliance and she is also morbidly obese with diabetic polyneuropathy. She has been on Augmentin for a while and continues to be a smoker. Xray of the right foot done on 10/08/2016 -- IMPRESSION:Soft tissue swelling and subcutaneous air within the right great toe compatible with ongoing cellulitis.No other acute osseous finding or evidence of definite osteomyelitis by plain radiography. No radiopaque foreign body. Last Hemoglobin A1c done on 09/12/2016 -- was 11%. She has had treatment for her ankle and leg and has had old fractures there which are being treated conservatively and she will follow-up with her orthopedic specialist at [facility]. Wound History Patient presents with 1 open wound that has been present for approximately 76month.  Patient has been treating wound in the following manner: dry dressing. Laboratory tests have not been performed in the last month. Patient reportedly has not tested positive for an antibiotic resistant organism. Patient reportedly has not tested positive for osteomyelitis. Patient reportedly has not had testing performed to evaluate circulation in the legs. Patient experiences the following problems associated with their wounds: infection. Rachel George, Rachel George ([id number]) Patient History Information obtained from Patient. Allergies Shellfish Containing Products Family History Cancer - Siblings, Diabetes - Father, Siblings, Hypertension - Siblings, Father, Mother, Seizures -  Siblings, Thyroid Problems - Siblings, Maternal Grandparents, Mother, No family history of Heart Disease, Hereditary Spherocytosis, Kidney Disease, Lung Disease, Stroke, Tuberculosis. Social History Current every day smoker, Marital Status - Married, Alcohol Use - Never, Drug Use - Prior History, Caffeine Use - Never. Medical History Ear/Nose/Mouth/Throat Denies history of Chronic sinus problems/congestion, Middle ear problems Hematologic/Lymphatic Denies history of Anemia, Hemophilia, Human Immunodeficiency Virus, Lymphedema, Sickle Cell Disease Respiratory Denies history of Aspiration, Asthma, Chronic Obstructive Pulmonary Disease (COPD), Pneumothorax, Sleep Apnea, Tuberculosis Cardiovascular Patient has history of Hypertension Gastrointestinal Denies history of Cirrhosis , Colitis, Crohn s, Hepatitis A, Hepatitis B, Hepatitis C Endocrine Patient has history of Type II Diabetes Immunological Denies history of Lupus Erythematosus, Raynaud s, Scleroderma Integumentary (Skin) Denies history of History of Burn, History of pressure wounds Musculoskeletal Denies history of Gout, Rheumatoid Arthritis, Osteoarthritis, Osteomyelitis Neurologic Patient has history of Neuropathy Denies history of Dementia, Quadriplegia, Paraplegia, Seizure Disorder Oncologic Denies history of Received Chemotherapy, Received Radiation Psychiatric Denies history of Anorexia/bulimia, Confinement Anxiety Patient is treated with Insulin, Oral Agents. Blood sugar results noted at the following times: Bedtime - 284. Review of Systems (ROS) Constitutional Symptoms (General Health) The patient has no complaints or symptoms. Rachel George, Rachel George ([id number]) Eyes Complains or has symptoms of Dry Eyes, Glasses / Contacts. Ear/Nose/Mouth/Throat The patient has no complaints or symptoms. Hematologic/Lymphatic The patient has no complaints or symptoms. Respiratory The patient has no complaints or  symptoms. Cardiovascular The patient has no complaints or symptoms. Gastrointestinal The patient has no complaints or symptoms. Endocrine The patient has no complaints or symptoms. Genitourinary The patient has no complaints or symptoms. Immunological The patient has no complaints or symptoms. Integumentary (Skin) Complains or has symptoms of Wounds, Breakdown. Musculoskeletal The patient has no complaints or symptoms. Neurologic The patient has no complaints or symptoms. Oncologic The patient has no complaints or symptoms. Psychiatric The patient has no complaints or symptoms. Medications albuterol sulfate HFA 90 mcg/actuation aerosol inhaler inhalation HFA aerosol inhaler inhalation amoxicillin 875 mg-potassium clavulanate 125 mg tablet oral tablet oral She is also on amoxicillin clavulanate, Voltaren, insulin, metformin, tramadol. Objective Constitutional Pulse regular. Respirations normal and unlabored. Afebrile. Vitals Time Taken: 9:01 AM, Height: 67 in, Source: Stated, Weight: 343 lbs, BMI: 53.7, Temperature: 97.8  F, Pulse: 91 bpm, Respiratory Rate: 18 breaths/min, Blood Pressure: 137/81 mmHg. Rachel George, Rachel George ([id number]) Eyes Nonicteric. Reactive to light. Ears, Nose, Mouth, and Throat Lips, teeth, and gums WNL.Marland Kitchen Moist mucosa without lesions. Neck supple and nontender. No palpable supraclavicular or cervical adenopathy. Normal sized without goiter. Respiratory WNL. No retractions.. Cardiovascular Pedal Pulses WNL. ABI the left was 1.08 on the right was 1.15. he has edema of the right lower extremity right ankle and foot due to a orthopedic related injury about 6 weeks ago.. Gastrointestinal (GI) Abdomen without masses or tenderness.. No liver or spleen enlargement or tenderness.. Lymphatic No adneopathy. No adenopathy. No adenopathy. Musculoskeletal Adexa without tenderness or enlargement.. Digits and nails w/o clubbing, cyanosis,  infection,  petechiae, ischemia, or inflammatory conditions.Marland Kitchen Psychiatric Judgement and insight Intact.. No evidence of depression, anxiety, or agitation.. General Notes: the patient's right toe has a large thickened area of necrotic debris and her nailbed is also necrotic with fungal infection. There is no evidence of gross cellulitis but there is edema of the ankle and foot due to an orthopedic related injury. Using forceps and scissors I have debrided the entire roof of the right big toe including the nail which was necrotic. There is some subcutaneous thickened macerated skin possibly due to fluid collection over the month after injury. There is no evidence of gross cellulitis or pus. Integumentary (Hair, Skin) No suspicious lesions. No crepitus or fluctuance. No peri-wound warmth or erythema. No masses.. Wound #1 status is Open. Original cause of wound was Gradually Appeared. The wound is located on the Right,Dorsal [place]. The wound measures 1cm length x 6cm width x 0.5cm depth; 4.712cm^2 area and 2.356cm^3 volume. The wound is limited to skin breakdown. There is no tunneling or undermining noted. There is a large amount of serosanguineous drainage noted. The wound margin is thickened. There is no granulation within the wound bed. There is a large (67-100%) amount of necrotic tissue within the wound bed including Adherent Slough. The periwound skin appearance exhibited: Localized Edema, Maceration, Moist. The periwound skin appearance did not exhibit: Callus, Crepitus, Excoriation, Fluctuance, Friable, Induration, Rash, Scarring, Dry/Scaly, Atrophie Blanche, Cyanosis, Ecchymosis, Hemosiderin Staining, Mottled, Pallor, Rubor, Erythema. Periwound temperature was noted as No Abnormality. Rachel George, Rachel George ([id number]) Assessment Active Problems ICD-10 E11.621 - Type 2 diabetes mellitus with foot ulcer L97.512 - Non-pressure chronic ulcer of other part of right foot with fat layer exposed E66.01 -  Morbid (severe) obesity due to excess calories F17.218 - Nicotine dependence, cigarettes, with other nicotine-induced disorders 35 year old patient who has diabetes mellitus, morbidly obese and is a smoker has had a nonhealing wound to the right foot for about 6 weeks now. After review I have recommended: 1. dressing to be applied with silver alginate and a bordered foam to be changed daily after washing with soap and water. 2. Off-Loading this toe as much as possible. 3. spent 3 minutes discussing with her the need to completely give up smoking and have discussed the risks benefits and alternatives great detail 4. following up with her orthopedic specialist at [facility] regarding her ankle swelling and pain. 5. good control of her diabetes mellitus and appropriate nutrition has been discussed with her 6. Regular visits to the wound center Procedures Wound #1 Wound #1 is a Diabetic Wound/Ulcer of the Lower Extremity located on the Right,Dorsal Toe Great . There was a Skin/Subcutaneous Tissue Debridement ([id number]) debridement with total area of 30 sq cm performed by Evlyn Kanner, MD. with the following instrument(s): Forceps and Scissors to remove Viable and Non-Viable tissue/material including Exudate, Fibrin/Slough, Skin, Callus, and Subcutaneous after achieving pain control using Lidocaine 4% Topical Solution. A time out was conducted at 09:39, prior to the start of the procedure. There was no bleeding. The procedure was tolerated well with a pain level of Insensate throughout and a pain level of Insensate following the procedure. Post Debridement Measurements: 5cm length x 6cm width x 0.2cm depth; 4.712cm^3 volume. Character of Wound/Ulcer Post Debridement is stable. Severity of Tissue Post Debridement is: Fat layer exposed. Post procedure Diagnosis Wound #1: Same as Pre-Procedure Rachel George, Rachel George ([id number]) Plan Wound Cleansing: Wound #1 Right,Dorsal Toe  Great: Cleanse wound with mild soap and water May Shower, gently  pat wound dry prior to applying new dressing. Anesthetic: Wound #1 Right,Dorsal Toe Great: Topical Lidocaine 4% cream applied to wound bed prior to debridement Primary Wound Dressing: Wound #1 Right,Dorsal Toe Great: Sorbalgon Ag - or equivalent Secondary Dressing: Wound #1 Right,Dorsal Toe Great: Gauze and Kerlix/Conform Dressing Change Frequency: Wound #1 Right,Dorsal Toe Great: Change dressing every day. Follow-up Appointments: Wound #1 Right,Dorsal Toe Great: Return Appointment in 1 week. Off-Loading: Wound #1 Right,Dorsal Toe Great: Open toe surgical shoe with peg assist. Additional Orders / Instructions: Wound #1 Right,Dorsal Toe Great: Stop Smoking Increase protein intake. Activity as tolerated Other: - Zinc, MVI, Vit A, C 35 year old patient who has diabetes mellitus, morbidly obese and is a smoker has had a nonhealing wound to the right foot for about 6 weeks now. After review I have recommended: 1. dressing to be applied with silver alginate and a bordered foam to be changed daily after washing with soap and water. 2. Off-Loading this toe as much as possible. 3. spent 3 minutes discussing with her the need to completely give up smoking and have discussed the risks benefits and alternatives great detail 4. following up with her orthopedic specialist at [facility] regarding her ankle swelling and pain. 5. good control of her diabetes mellitus and appropriate nutrition has been discussed with her 6. Regular visits to the wound center Rachel BarkerROBERTSON, Rachel George ([id number]) Electronic Signature(s) Signed: 10/10/2016 10:21:29 AM By: Evlyn KannerBritto, Harshini Trent MD, FACS Entered By: Evlyn KannerBritto, Emmabelle Fear on 10/10/2016 10:21:29 Rachel BarkerROBERTSON, Rachel George ([id number]) -------------------------------------------------------------------------------- ROS/PFSH Details Patient Name: Rachel BarkerOBERTSON, Korrina Date of Service: 10/10/2016 8:45 AM Medical  Record Patient Account Number: [id number] Number: Afful, RN, BSN, Treating RN: December 17, 1980 (35 y.o. [place] Date of Birth/Sex: Female) Other Clinician: Primary Care Physician: Jacquelin HawkingMCELROY, SHANNON Treating Lilac Hoff Referring Physician: Jacquelin HawkingMCELROY, SHANNON Physician/Extender: Weeks in Treatment: 0 Information Obtained From Patient Wound History Do you currently have one or more open woundso Yes How many open wounds do you currently haveo 1 Approximately how long have you had your woundso 57month How have you been treating your wound(s) until nowo dry dressing Has your wound(s) ever healed and then re-openedo No Have you had any lab work done in the past montho No Have you tested positive for an antibiotic resistant organism (MRSA, VRE)o No Have you tested positive for osteomyelitis (bone infection)o No Have you had any tests for circulation on your legso No Have you had other problems associated with your woundso Infection Eyes Complaints and Symptoms: Positive for: Dry Eyes; Glasses / Contacts Integumentary (Skin) Complaints and Symptoms: Positive for: Wounds; Breakdown Medical History: Negative for: History of Burn; History of pressure wounds Constitutional Symptoms (General Health) Complaints and Symptoms: No Complaints or Symptoms Ear/Nose/Mouth/Throat Complaints and Symptoms: No Complaints or Symptoms Medical History: Negative for: Chronic sinus problems/congestion; Middle ear problems Rachel BarkerROBERTSON, Kjersti ([id number]) Hematologic/Lymphatic Complaints and Symptoms: No Complaints or Symptoms Medical History: Negative for: Anemia; Hemophilia; Human Immunodeficiency Virus; Lymphedema; Sickle Cell Disease Respiratory Complaints and Symptoms: No Complaints or Symptoms Medical History: Negative for: Aspiration; Asthma; Chronic Obstructive Pulmonary Disease (COPD); Pneumothorax; Sleep Apnea; Tuberculosis Cardiovascular Complaints and Symptoms: No Complaints or  Symptoms Medical History: Positive for: Hypertension Gastrointestinal Complaints and Symptoms: No Complaints or Symptoms Medical History: Negative for: Cirrhosis ; Colitis; Crohnos; Hepatitis A; Hepatitis B; Hepatitis C Endocrine Complaints and Symptoms: No Complaints or Symptoms Medical History: Positive for: Type II Diabetes Time with diabetes: 20 year Treated with: Insulin, Oral agents Blood sugar testing results: Bedtime: 284 Genitourinary Complaints and Symptoms: No Complaints or  Symptoms Immunological KWYNN, SCHLOTTER ([id number]) Complaints and Symptoms: No Complaints or Symptoms Medical History: Negative for: Lupus Erythematosus; Raynaudos; Scleroderma Musculoskeletal Complaints and Symptoms: No Complaints or Symptoms Medical History: Negative for: Gout; Rheumatoid Arthritis; Osteoarthritis; Osteomyelitis Neurologic Complaints and Symptoms: No Complaints or Symptoms Medical History: Positive for: Neuropathy Negative for: Dementia; Quadriplegia; Paraplegia; Seizure Disorder Oncologic Complaints and Symptoms: No Complaints or Symptoms Medical History: Negative for: Received Chemotherapy; Received Radiation Psychiatric Complaints and Symptoms: No Complaints or Symptoms Medical History: Negative for: Anorexia/bulimia; Confinement Anxiety Immunizations Pneumococcal Vaccine: Received Pneumococcal Vaccination: No Family and Social History Cancer: Yes - Siblings; Diabetes: Yes - Father, Siblings; Heart Disease: No; Hereditary Spherocytosis: No; Hypertension: Yes - Siblings, Father, Mother; Kidney Disease: No; Lung Disease: No; Seizures: Yes - Siblings; Stroke: No; Thyroid Problems: Yes - Siblings, Maternal Grandparents, Mother; Tuberculosis: No; Current every day smoker; Marital Status - Married; Alcohol Use: Never; Drug Use: Prior History; Caffeine Use: Never; Financial Concerns: No; Food, Clothing or Shelter Needs: No; Support System Lacking:  No; Transportation Concerns: No; Advanced Directives: No; Patient does not want information on Advanced Directives; Living Will: No ILEEN, KAHRE ([id number]) Physician Affirmation I have reviewed and agree with the above information. Electronic Signature(s) Signed: 10/10/2016 5:08:19 PM By: Evlyn Kanner MD, FACS Signed: 10/10/2016 5:10:52 PM By: Elpidio Eric BSN, RN Entered By: Evlyn Kanner on 10/10/2016 09:17:57 ODILIA, DAMICO ([id number]) -------------------------------------------------------------------------------- SuperBill Details Patient Name: Rachel George Date of Service: 10/10/2016 Medical Record Patient Account Number: [id number] Number: Afful, RN, BSN, Treating RN: Jan 28, 1981 (35 y.o. [place] Date of Birth/Sex: Female) Other Clinician: Primary Care Physician: Jacquelin Hawking Treating [place] Ferch Referring Physician: Jacquelin Hawking Physician/Extender: Weeks in Treatment: 0 Diagnosis Coding ICD-10 Codes Code Description E11.621 Type 2 diabetes mellitus with foot ulcer 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t with fat layer exposed E66.01 Morbid (severe) obesity due to excess calories F17.218 Nicotine dependence, cigarettes, with other nicotine-induced disorders Facility Procedures CPT4 Code Description: 08657846 99213 - WOUND CARE VISIT-LEV 3 EST PT Modifier: Quantity: 1 CPT4 Code Description: [id number] 11042 - DEB SUBQ TISSUE 20 SQ CM/< ICD-10 Description Diagnosis E11.621 Type 2 diabetes mellitus with foot ulcer F17.218 Nicotine dependence, cigarettes, with other nicotine- L97.512 Non-pressure chronic ulcer of other  part of right foo E66.01 Morbid (severe) obesity due to excess calories Modifier: induced disor t with fat la Quantity: 1 ders yer exposed CPT4 Code Description: 13244010 11045 - DEB SUBQ TISS EA ADDL 20CM ICD-10 Description Diagnosis E11.621 Type 2 diabetes mellitus with foot ulcer L97.512 Non-pressure chronic  ulcer of other part of right foo E66.01 Morbid (severe) obesity due to excess  calories F17.218 Nicotine dependence, cigarettes, with other nicotine- Modifier: t with fat la induced disor Quantity: 1 yer exposed ders CPT4 Code Description: [id number] 99406-SMOKING CESSATION 3-10MINS ICD-10 Description Diagnosis E11.621 Type 2 diabetes mellitus with foot ulcer 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 F17.218 Nicotine dependence, cigarettes, with  other nicotine- LORECE, KEACH ([id number]) Modifier: t with fat la induced disor Quantity: 1 yer exposed ders Physician Procedures CPT4 Code Description: 5638756 43329 - WC PHYS LEVEL 4 - NEW PT ICD-10 Description Diagnosis E11.621 Type 2 diabetes mellitus with foot ulcer 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 E66.01 Morbid (severe) obesity due to excess  calories F17.218 Nicotine dependence, cigarettes, with other nicotine- Modifier: t with fat lay induced disord Quantity: 1 er exposed ers CPT4 Code Description: 5188416 11042 - WC PHYS SUBQ TISS 20 SQ CM ICD-10 Description  Diagnosis E11.621 Type 2 diabetes mellitus with foot ulcer F17.218 Nicotine dependence, cigarettes, with other nicotine- L97.512 Non-pressure chronic ulcer of other part  of right foo E66.01 Morbid (severe) obesity due to excess calories Modifier: induced disord t with fat lay Quantity: 1 ers er exposed CPT4 Code Description: [id number] 11045 - WC PHYS SUBQ TISS EA ADDL 20 CM ICD-10 Description Diagnosis E11.621 Type 2 diabetes mellitus with foot ulcer 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 E66.01 Morbid (severe) obesity due to  excess calories F17.218 Nicotine dependence, cigarettes, with other nicotine- Modifier: t with fat lay induced disord Quantity: 1 er exposed ers CPT4 Code Description: [phone]- SMOKING CESSATION 3-10 MINS ICD-10 Description Diagnosis E11.621 Type 2 diabetes mellitus with foot ulcer L97.512  Non-pressure chronic ulcer of other part of right foo F17.218 Nicotine dependence, cigarettes, with  other nicotine- Modifier: t with fat lay induced disord Quantity: 1 er exposed Financial traderers Electronic Signature(s) Signed: 10/10/2016 10:22:09 AM By: Evlyn KannerBritto, Andromeda Poppen MD, FACS Entered By: Evlyn KannerBritto, Kennah Hehr on 10/10/2016 10:22:08

## 2016-10-11 NOTE — Progress Notes (Signed)
Rachel George, Leata ([id number]) Visit Report for 10/10/2016 Abuse/Suicide Risk Screen Details Patient Name: Rachel George, [name] Date of Service: 10/10/2016 8:45 AM Medical Record Patient Account Number: [id number] Number: Afful, RN, BSN, Treating RN: 10-05-1981 (35 y.o. [place] Date of Birth/Sex: Female) Other Clinician: Primary Care Physician: Jacquelin HawkingMCELROY, SHANNON Treating Britto, Errol Referring Physician: Jacquelin HawkingMCELROY, SHANNON Physician/Extender: Weeks in Treatment: 0 Abuse/Suicide Risk Screen Items Answer ABUSE/SUICIDE RISK SCREEN: Has anyone close to you tried to hurt or harm you recentlyo No Do you feel uncomfortable with anyone in your familyo No Has anyone forced you do things that you didnot want to doo No Do you have any thoughts of harming yourselfo No Patient displays signs or symptoms of abuse and/or neglect. No Electronic Signature(s) Signed: 10/10/2016 5:10:52 PM By: Elpidio EricAfful, Rita BSN, RN Entered By: Elpidio EricAfful, Rita on 10/10/2016 09:02:31 Rachel George, Anani ([id number]) -------------------------------------------------------------------------------- Activities of Daily Living Details Patient Name: Rachel George, Steph Date of Service: 10/10/2016 8:45 AM Medical Record Patient Account Number: [id number] Number: Afful, RN, BSN, Treating RN: 10-05-1981 (35 y.o. [place] Date of Birth/Sex: Female) Other Clinician: Primary Care Physician: Jacquelin HawkingMCELROY, SHANNON Treating Evlyn KannerBritto, Errol Referring Physician: Jacquelin HawkingMCELROY, SHANNON Physician/Extender: Weeks in Treatment: 0 Activities of Daily Living Items Answer Activities of Daily Living (Please select one for each item) Drive Automobile Completely Able Take Medications Completely Able Use Telephone Completely Able Care for Appearance Completely Able Use Toilet Completely Able Bath / Shower Completely Able Dress Self Completely Able Feed Self Completely Able Walk Completely Able Get In / Out Bed Completely Able Housework Completely  Able Prepare Meals Completely Able Handle Money Completely Able Shop for Self Completely Able Electronic Signature(s) Signed: 10/10/2016 5:10:52 PM By: Elpidio EricAfful, Rita BSN, RN Entered By: Elpidio EricAfful, Rita on 10/10/2016 09:02:49 Rachel George, Pernell ([id number]) -------------------------------------------------------------------------------- Education Assessment Details Patient Name: Rachel George, Nataleah Date of Service: 10/10/2016 8:45 AM Medical Record Patient Account Number: [id number] Number: Afful, RN, BSN, Treating RN: 10-05-1981 (35 y.o. [place] Date of Birth/Sex: Female) Other Clinician: Primary Care Physician: Jacquelin HawkingMCELROY, SHANNON Treating Britto, Errol Referring Physician: Jacquelin HawkingMCELROY, SHANNON Physician/Extender: Tania AdeWeeks in Treatment: 0 Primary Learner Assessed: Patient Learning Preferences/Education Level/Primary Language Learning Preference: Explanation Highest Education Level: College or Above Preferred Language: English Cognitive Barrier Assessment/Beliefs Language Barrier: No Physical Barrier Assessment Impaired Vision: Yes Glasses Knowledge/Comprehension Assessment Knowledge Level: High Comprehension Level: High Ability to understand written High instructions: Ability to understand verbal High instructions: Motivation Assessment Anxiety Level: Calm Cooperation: Cooperative Education Importance: Acknowledges Need Interest in Health Problems: Asks Questions Perception: Coherent Willingness to Engage in Self- High Management Activities: Readiness to Engage in Self- High Management Activities: Electronic Signature(s) Signed: 10/10/2016 5:10:52 PM By: Elpidio EricAfful, Rita BSN, RN Entered By: Elpidio EricAfful, Rita on 10/10/2016 09:03:08 Rachel George, Deandrea ([id number]) -------------------------------------------------------------------------------- Fall Risk Assessment Details Patient Name: Rachel George, Autum Date of Service: 10/10/2016 8:45 AM Medical Record Patient Account Number:  [id number] Number: Afful, RN, BSN, Treating RN: 10-05-1981 (35 y.o. [place] Date of Birth/Sex: Female) Other Clinician: Primary Care Physician: Jacquelin HawkingMCELROY, SHANNON Treating Britto, Errol Referring Physician: Jacquelin HawkingMCELROY, SHANNON Physician/Extender: Weeks in Treatment: 0 Fall Risk Assessment Items Have you had 2 or more falls in the last 12 monthso 0 Yes Have you had any fall that resulted in injury in the last 12 monthso 0 Yes FALL RISK ASSESSMENT: History of falling - immediate or within 3 months 25 Yes Secondary diagnosis 0 No Ambulatory aid None/bed rest/wheelchair/nurse 0 Yes Crutches/cane/walker 0 No Furniture 0 No IV Access/Saline Lock 0 No Gait/Training Normal/bed rest/immobile 0 Yes Weak 0 No Impaired 0 No Mental Status  Oriented to own ability 0 Yes Electronic Signature(s) Signed: 10/10/2016 5:10:52 PM By: Elpidio EricAfful, Rita BSN, RN Entered By: Elpidio EricAfful, Rita on 10/10/2016 09:03:20 Rachel George, Deshana ([id number]) -------------------------------------------------------------------------------- Foot Assessment Details Patient Name: Rachel George, Aundrea Date of Service: 10/10/2016 8:45 AM Medical Record Patient Account Number: [id number] Number: Afful, RN, BSN, Treating RN: 1981-03-26 (35 y.o. [place] Date of Birth/Sex: Female) Other Clinician: Primary Care Physician: Jacquelin HawkingMCELROY, SHANNON Treating Britto, Errol Referring Physician: Jacquelin HawkingMCELROY, SHANNON Physician/Extender: Weeks in Treatment: 0 Foot Assessment Items Site Locations + = Sensation present, - = Sensation absent, C = Callus, U = Ulcer R = Redness, W = Warmth, M = Maceration, PU = Pre-ulcerative lesion F = Fissure, S = Swelling, D = Dryness Assessment Right: Left: Other Deformity: No No Prior Foot Ulcer: No No Prior Amputation: No No Charcot Joint: No No Ambulatory Status: Ambulatory Without Help Gait: Steady Electronic Signature(s) Signed: 10/10/2016 5:10:52 PM By: Elpidio EricAfful, Rita BSN, RN Entered By: Elpidio EricAfful, Rita on  10/10/2016 09:03:45 Rachel George, Demri ([id number]) -------------------------------------------------------------------------------- Nutrition Risk Assessment Details Patient Name: Rachel George, Jaleyah Date of Service: 10/10/2016 8:45 AM Medical Record Patient Account Number: [id number] Number: Afful, RN, BSN, Treating RN: 1981-03-26 (35 y.o. [place] Date of Birth/Sex: Female) Other Clinician: Primary Care Physician: Jacquelin HawkingMCELROY, SHANNON Treating Britto, Errol Referring Physician: Jacquelin HawkingMCELROY, SHANNON Physician/Extender: Weeks in Treatment: 0 Height (in): 67 Weight (lbs): 343 Body Mass Index (BMI): 53.7 Nutrition Risk Assessment Items NUTRITION RISK SCREEN: I have an illness or condition that made me change the kind and/or 0 No amount of food I eat I eat fewer than two meals per day 0 No I eat few fruits and vegetables, or milk products 0 No I have three or more drinks of beer, liquor or wine almost every day 0 No I have tooth or mouth problems that make it hard for me to eat 0 No I don't always have enough money to buy the food I need 0 No I eat alone most of the time 0 No I take three or more different prescribed or over-the-counter drugs a 0 No day Without wanting to, I have lost or gained 10 pounds in the last six 0 No months I am not always physically able to shop, cook and/or feed myself 0 No Nutrition Protocols Good Risk Protocol 0 No interventions needed Moderate Risk Protocol Electronic Signature(s) Signed: 10/10/2016 5:10:52 PM By: Elpidio EricAfful, Rita BSN, RN Entered By: Elpidio EricAfful, Rita on 10/10/2016 09:03:25

## 2016-10-11 NOTE — Progress Notes (Signed)
Rachel BarkerROBERTSON, Mollie ([id number]) Visit Report for 10/10/2016 Allergy List Details Patient Name: Rachel BarkerROBERTSON, Laloni Date of Service: 10/10/2016 8:45 AM Medical Record Number: [id number] Patient Account Number: [id number] Date of Birth/Sex: June 10, 1981 (35 y.o. Female) Treating RN: Afful, RN, BSN, [place] Sinkita Primary Care Physician: Jacquelin HawkingMCELROY, SHANNON Other Clinician: Referring Physician: Jacquelin HawkingMCELROY, SHANNON Treating Physician/Extender: Rudene ReBritto, Errol Weeks in Treatment: 0 Allergies Active Allergies Shellfish Containing Products Allergy Notes Electronic Signature(s) Signed: 10/10/2016 5:10:52 PM By: Elpidio EricAfful, Rita BSN, RN Entered By: Elpidio EricAfful, Rita on 10/10/2016 09:04:04 Rachel BarkerOBERTSON, Makyna ([id number]) -------------------------------------------------------------------------------- Arrival Information Details Patient Name: Rachel BarkerOBERTSON, Ardell Date of Service: 10/10/2016 8:45 AM Medical Record Number: [id number] Patient Account Number: [id number] Date of Birth/Sex: June 10, 1981 (35 y.o. Female) Treating RN: Clover MealyAfful, RN, BSN, [place] Sinkita Primary Care Physician: Jacquelin HawkingMCELROY, SHANNON Other Clinician: Referring Physician: Jacquelin HawkingMCELROY, SHANNON Treating Physician/Extender: Rudene ReBritto, Errol Weeks in Treatment: 0 Visit Information Patient Arrived: Ambulatory Arrival Time: 08:58 Accompanied By: self Transfer Assistance: None Patient Identification Verified: Yes Secondary Verification Process Yes Completed: Patient Requires Transmission-Based No Precautions: Patient Has Alerts: No Electronic Signature(s) Signed: 10/10/2016 5:10:52 PM By: Elpidio EricAfful, Rita BSN, RN Entered By: Elpidio EricAfful, Rita on 10/10/2016 09:00:58 Rachel BarkerOBERTSON, Malaney ([id number]) -------------------------------------------------------------------------------- Clinic Level of Care Assessment Details Patient Name: Rachel BarkerOBERTSON, Jameya Date of Service: 10/10/2016 8:45 AM Medical Record Number: [id number] Patient Account Number: [id number] Date of Birth/Sex: June 10, 1981 (35 y.o. Female) Treating  RN: Afful, RN, BSN, [place] Sinkita Primary Care Physician: Jacquelin HawkingMCELROY, SHANNON Other Clinician: Referring Physician: Jacquelin HawkingMCELROY, SHANNON Treating Physician/Extender: Rudene ReBritto, Errol Weeks in Treatment: 0 Clinic Level of Care Assessment Items TOOL 1 Quantity Score []  - Use when EandM and Procedure is performed on INITIAL visit 0 ASSESSMENTS - Nursing Assessment / Reassessment X - General Physical Exam (combine w/ comprehensive assessment (listed just 1 20 below) when performed on new pt. evals) X - Comprehensive Assessment (HX, ROS, Risk Assessments, Wounds Hx, etc.) 1 25 ASSESSMENTS - Wound and Skin Assessment / Reassessment []  - Dermatologic / Skin Assessment (not related to wound area) 0 ASSESSMENTS - Ostomy and/or Continence Assessment and Care []  - Incontinence Assessment and Management 0 []  - Ostomy Care Assessment and Management (repouching, etc.) 0 PROCESS - Coordination of Care X - Simple Patient / Family Education for ongoing care 1 15 []  - Complex (extensive) Patient / Family Education for ongoing care 0 X - Staff obtains ChiropractorConsents, Records, Test Results / Process Orders 1 10 []  - Staff telephones HHA, Nursing Homes / Clarify orders / etc 0 []  - Routine Transfer to another Facility (non-emergent condition) 0 []  - Routine Hospital Admission (non-emergent condition) 0 X - New Admissions / Manufacturing engineernsurance Authorizations / Ordering NPWT, Apligraf, etc. 1 15 []  - Emergency Hospital Admission (emergent condition) 0 PROCESS - Special Needs []  - Pediatric / Minor Patient Management 0 []  - Isolation Patient Management 0 Rachel BarkerROBERTSON, Keyasha ([id number]) []  - Hearing / Language / Visual special needs 0 []  - Assessment of Community assistance (transportation, D/C planning, etc.) 0 []  - Additional assistance / Altered mentation 0 []  - Support Surface(s) Assessment (bed, cushion, seat, etc.) 0 INTERVENTIONS - Miscellaneous []  - External ear exam 0 []  - Patient Transfer (multiple staff / Nurse, adultHoyer Lift / Similar  devices) 0 []  - Simple Staple / Suture removal (25 or less) 0 []  - Complex Staple / Suture removal (26 or more) 0 []  - Hypo/Hyperglycemic Management (do not check if billed separately) 0 X - Ankle / Brachial Index (ABI) - do not check if billed separately 1 15 Has the patient been seen at the hospital within the last three  years: Yes Total Score: 100 Level Of Care: New/Established - Level 3 Electronic Signature(s) Signed: 10/10/2016 5:10:52 PM By: Elpidio EricAfful, Rita BSN, RN Entered By: Elpidio EricAfful, Rita on 10/10/2016 09:51:16 Rachel BarkerOBERTSON, Shaden ([id number]) -------------------------------------------------------------------------------- Encounter Discharge Information Details Patient Name: Rachel BarkerOBERTSON, Khadijah Date of Service: 10/10/2016 8:45 AM Medical Record Number: [id number] Patient Account Number: [id number] Date of Birth/Sex: [birth year] (35 y.o. Female) Treating RN: Clover MealyAfful, RN, BSN, [place] Sinkita Primary Care Physician: Jacquelin HawkingMCELROY, SHANNON Other Clinician: Referring Physician: Jacquelin HawkingMCELROY, SHANNON Treating Physician/Extender: Rudene ReBritto, Errol Weeks in Treatment: 0 Encounter Discharge Information Items Discharge Pain Level: 0 Discharge Condition: Stable Ambulatory Status: Ambulatory Discharge Destination: Home Transportation: Private Auto Accompanied By: self Schedule Follow-up Appointment: No Medication Reconciliation completed and provided to Patient/Care No [place] Marcucci: Provided on Clinical Summary of Care: 10/10/2016 Form Type Recipient Paper Patient KR Electronic Signature(s) Signed: 10/10/2016 5:10:52 PM By: Elpidio EricAfful, Rita BSN, RN Previous Signature: 10/10/2016 9:59:44 AM Version By: Gwenlyn PerkingMoore, Shelia Entered By: Elpidio EricAfful, Rita on 10/10/2016 10:03:18 Rachel BarkerOBERTSON, Carrianne ([id number]) -------------------------------------------------------------------------------- Lower Extremity Assessment Details Patient Name: Rachel BarkerOBERTSON, Katerra Date of Service: 10/10/2016 8:45 AM Medical Record Number: [id number] Patient Account Number:  [id number] Date of Birth/Sex: [birth year] (35 y.o. Female) Treating RN: Afful, RN, BSN, Rita Primary Care Physician: Jacquelin HawkingMCELROY, SHANNON Other Clinician: Referring Physician: Jacquelin HawkingMCELROY, SHANNON Treating Physician/Extender: Rudene ReBritto, Errol Weeks in Treatment: 0 Edema Assessment Assessed: [Left: No] [Right: No] Edema: [Left: No] [Right: Yes] Calf Left: Right: Point of Measurement: 31 cm From Medial Instep 39 cm 42.5 cm Ankle Left: Right: Point of Measurement: 8 cm From Medial Instep 22.3 cm 27.5 cm Vascular Assessment Claudication: Claudication Assessment [Left:None] [Right:None] Pulses: Posterior Tibial Palpable: [Left:Yes] [Right:No] Doppler: [Left:Multiphasic] [Right:Multiphasic] Dorsalis Pedis Palpable: [Left:Yes] [Right:Yes] Doppler: [Left:Multiphasic] [Right:Multiphasic] Extremity colors, hair growth, and conditions: Extremity Color: [Left:Normal] [Right:Normal] Hair Growth on Extremity: [Left:Yes] [Right:Yes] Temperature of Extremity: [Left:Warm] [Right:Warm] Capillary Refill: [Left:< 3 seconds] [Right:< 3 seconds] Blood Pressure: Brachial: [Left:128] [Right:137] Dorsalis Pedis: 142 [Left:Dorsalis Pedis: 138] Ankle: Posterior Tibial: 148 [Left:Posterior Tibial: 158 1.08] [Right:1.15] Toe Nail Assessment Left: Right: Thick: Yes Yes Discolored: Yes Yes Rachel BarkerROBERTSON, Bridgette ([id number]) Deformed: No No Improper Length and Hygiene: No No Electronic Signature(s) Signed: 10/10/2016 5:10:52 PM By: Elpidio EricAfful, Rita BSN, RN Entered By: Elpidio EricAfful, Rita on 10/10/2016 09:25:54 Rachel BarkerOBERTSON, Solash ([id number]) -------------------------------------------------------------------------------- Multi Wound Chart Details Patient Name: Rachel BarkerOBERTSON, Lynnley Date of Service: 10/10/2016 8:45 AM Medical Record Number: [id number] Patient Account Number: [id number] Date of Birth/Sex: [birth year] (35 y.o. Female) Treating RN: Clover MealyAfful, RN, BSN, [place] Sinkita Primary Care Physician: Jacquelin HawkingMCELROY, SHANNON Other Clinician: Referring  Physician: Jacquelin HawkingMCELROY, SHANNON Treating Physician/Extender: Rudene ReBritto, Errol Weeks in Treatment: 0 Vital Signs Height(in): 67 Pulse(bpm): 91 Weight(lbs): 343 Blood Pressure 137/81 (mmHg): Body Mass Index(BMI): 54 Temperature(F): 97.8 Respiratory Rate 18 (breaths/min): Photos: [1:No Photos] [N/A:N/A] Wound Location: [1:Right Toe Great - Dorsal] [N/A:N/A] Wounding Event: [1:Gradually Appeared] [N/A:N/A] Primary Etiology: [1:Diabetic Wound/Ulcer of the Lower Extremity] [N/A:N/A] Comorbid History: [1:Hypertension, Type II Diabetes, Neuropathy] [N/A:N/A] Date Acquired: [1:09/11/2016] [N/A:N/A] Weeks of Treatment: [1:0] [N/A:N/A] Wound Status: [1:Open] [N/A:N/A] Measurements L x W x D 1x6x0.5 [N/A:N/A] (cm) Area (cm) : [1:4.712] [N/A:N/A] Volume (cm) : [1:2.356] [N/A:N/A] Classification: [1:Grade 2] [N/A:N/A] Exudate Amount: [1:Large] [N/A:N/A] Exudate Type: [1:Serosanguineous] [N/A:N/A] Exudate Color: [1:red, brown] [N/A:N/A] Foul Odor After [1:Yes] [N/A:N/A] Cleansing: Odor Anticipated Due to No [N/A:N/A] Product Use: Wound Margin: [1:Thickened] [N/A:N/A] Granulation Amount: [1:None Present (0%)] [N/A:N/A] Necrotic Amount: [1:Large (67-100%)] [N/A:N/A] Exposed Structures: [1:Fascia: No Fat: No Tendon: No Muscle: No Joint: No] [N/A:N/A] Bone: No Limited to Skin Breakdown Epithelialization: None N/A N/A Periwound Skin Texture: Edema: Yes  N/A N/A Excoriation: No Induration: No Callus: No Crepitus: No Fluctuance: No Friable: No Rash: No Scarring: No Periwound Skin Maceration: Yes N/A N/A Moisture: Moist: Yes Dry/Scaly: No Periwound Skin Color: Atrophie Blanche: No N/A N/A Cyanosis: No Ecchymosis: No Erythema: No Hemosiderin Staining: No Mottled: No Pallor: No Rubor: No Temperature: No Abnormality N/A N/A Tenderness on No N/A N/A Palpation: Wound Preparation: Ulcer Cleansing: N/A N/A Rinsed/Irrigated with Saline Topical Anesthetic Applied: Other:  lidocaine 4% Treatment Notes Electronic Signature(s) Signed: 10/10/2016 5:10:52 PM By: Elpidio Eric BSN, RN Entered By: Elpidio Eric on 10/10/2016 09:40:24 ASMAA, TIRPAK ([id number]) -------------------------------------------------------------------------------- Multi-Disciplinary Care Plan Details Patient Name: Rachel George Date of Service: 10/10/2016 8:45 AM Medical Record Number: [id number] Patient Account Number: [id number] Date of Birth/Sex: 1981/08/04 (35 y.o. Female) Treating RN: Afful, RN, BSN, [place] Sink Primary Care Physician: Jacquelin Hawking Other Clinician: Referring Physician: Jacquelin Hawking Treating Physician/Extender: Rudene Re in Treatment: 0 Active Inactive Abuse / Safety / Falls / Self Care Management Nursing Diagnoses: Impaired home maintenance Impaired physical mobility Knowledge deficit related to: safety; personal, health (wound), emergency Potential for falls Self care deficit: actual or potential Goals: Patient/caregiver will verbalize understanding of skin care regimen Date Initiated: 10/10/2016 Goal Status: Active Patient/caregiver will verbalize/demonstrate measure taken to improve self care Date Initiated: 10/10/2016 Goal Status: Active Patient/caregiver will verbalize/demonstrate measures taken to improve the patient's personal safety Date Initiated: 10/10/2016 Goal Status: Active Patient/caregiver will verbalize/demonstrate measures taken to prevent injury and/or falls Date Initiated: 10/10/2016 Goal Status: Active Patient/caregiver will verbalize/demonstrate understanding of what to do in case of emergency Date Initiated: 10/10/2016 Goal Status: Active Interventions: Assess fall risk on admission and as needed Assess: immobility, friction, shearing, incontinence upon admission and as needed Assess impairment of mobility on admission and as needed per policy Assess self care needs on admission and as needed Provide education on basic  hygiene Provide education on fall prevention Provide education on personal and home safety Provide education on safe transfers ZYION, LEIDNER ([id number]) Notes: Orientation to the Wound Care Program Nursing Diagnoses: Knowledge deficit related to the wound healing center program Goals: Patient/caregiver will verbalize understanding of the Wound Healing Center Program Date Initiated: 10/10/2016 Goal Status: Active Interventions: Provide education on orientation to the wound center Notes: Wound/Skin Impairment Nursing Diagnoses: Impaired tissue integrity Knowledge deficit related to smoking impact on wound healing Knowledge deficit related to ulceration/compromised skin integrity Goals: Patient will demonstrate a reduced rate of smoking or cessation of smoking Date Initiated: 10/10/2016 Goal Status: Active Patient/caregiver will verbalize understanding of skin care regimen Date Initiated: 10/10/2016 Goal Status: Active Ulcer/skin breakdown will have a volume reduction of 30% by week 4 Date Initiated: 10/10/2016 Goal Status: Active Ulcer/skin breakdown will have a volume reduction of 50% by week 8 Date Initiated: 10/10/2016 Goal Status: Active Ulcer/skin breakdown will have a volume reduction of 80% by week 12 Date Initiated: 10/10/2016 Goal Status: Active Ulcer/skin breakdown will heal within 14 weeks Date Initiated: 10/10/2016 Goal Status: Active Interventions: Assess patient/caregiver ability to obtain necessary supplies SHAYDA, KALKA ([id number]) Assess patient/caregiver ability to perform ulcer/skin care regimen upon admission and as needed Assess ulceration(s) every visit Provide education on smoking Provide education on ulcer and skin care Treatment Activities: Referred to DME Charleene Callegari for dressing supplies : 10/10/2016 Skin care regimen initiated : 10/10/2016 Topical wound management initiated : 10/10/2016 Notes: Electronic Signature(s) Signed: 10/10/2016 5:10:52 PM  By: Elpidio Eric BSN, RN Entered By: Elpidio Eric on 10/10/2016 09:38:09 Rachel George ([id number]) -------------------------------------------------------------------------------- Pain Assessment Details Patient Name: Merilynn Finland,  Antonique Date of Service: 10/10/2016 8:45 AM Medical Record Number: [id number] Patient Account Number: [id number] Date of Birth/Sex: [birth year] (35 y.o. Female) Treating RN: Clover Mealy, RN, BSN, [place] Sink Primary Care Physician: Jacquelin Hawking Other Clinician: Referring Physician: Jacquelin Hawking Treating Physician/Extender: Rudene Re in Treatment: 0 Active Problems Location of Pain Severity and Description of Pain Patient Has Paino Yes Site Locations Pain Location: Pain in Ulcers With Dressing Change: No Rate the pain. Current Pain Level: 4 Pain Management and Medication Current Pain Management: Electronic Signature(s) Signed: 10/10/2016 5:10:52 PM By: Elpidio Eric BSN, RN Entered By: Elpidio Eric on 10/10/2016 09:01:19 Rachel George ([id number]) -------------------------------------------------------------------------------- Patient/Caregiver Education Details Patient Name: Rachel George Date of Service: 10/10/2016 8:45 AM Medical Record Number: [id number] Patient Account Number: [id number] Date of Birth/Gender: 1981-11-28 (34 y.o. Female) Treating RN: Clover Mealy, RN, BSN, [place] Sink Primary Care Physician: Jacquelin Hawking Other Clinician: Referring Physician: Jacquelin Hawking Treating Physician/Extender: Rudene Re in Treatment: 0 Education Assessment Education Provided To: Patient Education Topics Provided Basic Hygiene: Methods: Explain/Verbal Responses: State content correctly Safety: Methods: Explain/Verbal Responses: State content correctly Smoking and Wound Healing: Methods: Explain/Verbal Responses: State content correctly Welcome To The Wound Care Center: Methods: Explain/Verbal Responses: State content correctly Wound/Skin  Impairment: Methods: Explain/Verbal Responses: State content correctly Electronic Signature(s) Signed: 10/10/2016 5:10:52 PM By: Elpidio Eric BSN, RN Entered By: Elpidio Eric on 10/10/2016 10:03:41 Rachel George ([id number]) -------------------------------------------------------------------------------- Wound Assessment Details Patient Name: Rachel George Date of Service: 10/10/2016 8:45 AM Medical Record Number: [id number] Patient Account Number: [id number] Date of Birth/Sex: 1981/07/30 (35 y.o. Female) Treating RN: Afful, RN, BSN, [place] Sink Primary Care Physician: Jacquelin Hawking Other Clinician: Referring Physician: Jacquelin Hawking Treating Physician/Extender: Rudene Re in Treatment: 0 Wound Status Wound Number: 1 Primary Diabetic Wound/Ulcer of the Lower Etiology: Extremity Wound Location: Right Toe Great - Dorsal Wound Status: Open Wounding Event: Gradually Appeared Comorbid Hypertension, Type II Diabetes, Date Acquired: 09/11/2016 History: Neuropathy Weeks Of Treatment: 0 Clustered Wound: No Photos Photo Uploaded By: Elpidio Eric on 10/10/2016 17:10:14 Wound Measurements Length: (cm) 1 Width: (cm) 6 Depth: (cm) 0.5 Area: (cm) 4.712 Volume: (cm) 2.356 % Reduction in Area: % Reduction in Volume: Epithelialization: None Tunneling: No Undermining: No Wound Description Classification: Grade 2 Wound Margin: Thickened Exudate Amount: Large Exudate Type: Serosanguineous Exudate Color: red, brown DEWEY, NEUKAM ([id number]) Foul Odor After Cleansing: Yes Due to Product Use: No Wound Bed Granulation Amount: None Present (0%) Exposed Structure Necrotic Amount: Large (67-100%) Fascia Exposed: No Necrotic Quality: Adherent Slough Fat Layer Exposed: No Tendon Exposed: No Muscle Exposed: No Joint Exposed: No Bone Exposed: No Limited to Skin Breakdown Periwound Skin Texture Texture Color No Abnormalities Noted: No No Abnormalities Noted: No Callus:  No Atrophie Blanche: No Crepitus: No Cyanosis: No Excoriation: No Ecchymosis: No Fluctuance: No Erythema: No Friable: No Hemosiderin Staining: No Induration: No Mottled: No Localized Edema: Yes Pallor: No Rash: No Rubor: No Scarring: No Temperature / Pain Moisture Temperature: No Abnormality No Abnormalities Noted: No Dry / Scaly: No Maceration: Yes Moist: Yes Wound Preparation Ulcer Cleansing: Rinsed/Irrigated with Saline Topical Anesthetic Applied: Other: lidocaine 4%, Treatment Notes Wound #1 (Right, Dorsal Toe Great) 1. Cleansed with: Clean wound with Normal Saline 4. Dressing Applied: Aquacel Ag 5. Secondary Dressing Applied Gauze and Kerlix/Conform 6. Footwear/Offloading device applied Other footwear/offloading device applied (specify in notes) 7. Secured with Tape Notes VASHON, ARCH ([id number]) [place] Electronic Signature(s) Signed: 10/10/2016 5:10:52 PM By: Elpidio Eric BSN, RN Entered By: Elpidio Eric on 10/10/2016 09:16:21 Rachel George ([id number]) -------------------------------------------------------------------------------- Vitals  Details Patient Name: SHAMECKA, HOCUTT Date of Service: 10/10/2016 8:45 AM Medical Record Number: [id number] Patient Account Number: [id number] Date of Birth/Sex: Aug 20, 1981 (35 y.o. Female) Treating RN: Afful, RN, BSN, Rita Primary Care Physician: Jacquelin Hawking Other Clinician: Referring Physician: Jacquelin Hawking Treating Physician/Extender: Rudene Re in Treatment: 0 Vital Signs Time Taken: 09:01 Temperature (F): 97.8 Height (in): 67 Pulse (bpm): 91 Source: Stated Respiratory Rate (breaths/min): 18 Weight (lbs): 343 Blood Pressure (mmHg): 137/81 Body Mass Index (BMI): 53.7 Reference Range: 80 - 120 mg / dl Electronic Signature(s) Signed: 10/10/2016 5:10:52 PM By: Elpidio Eric BSN, RN Entered By: Elpidio Eric on 10/10/2016 09:02:07

## 2016-10-14 ENCOUNTER — Ambulatory Visit: Payer: Self-pay | Admitting: Physician Assistant

## 2016-10-15 ENCOUNTER — Encounter: Payer: Self-pay | Admitting: Physician Assistant

## 2016-10-15 ENCOUNTER — Ambulatory Visit: Payer: Self-pay | Admitting: Physician Assistant

## 2016-10-15 VITALS — BP 112/70 | HR 90 | Temp 97.7°F | Ht 66.0 in | Wt 341.8 lb

## 2016-10-15 DIAGNOSIS — E11621 Type 2 diabetes mellitus with foot ulcer: Secondary | ICD-10-CM | POA: Insufficient documentation

## 2016-10-15 DIAGNOSIS — E118 Type 2 diabetes mellitus with unspecified complications: Principal | ICD-10-CM

## 2016-10-15 DIAGNOSIS — L97509 Non-pressure chronic ulcer of other part of unspecified foot with unspecified severity: Secondary | ICD-10-CM

## 2016-10-15 DIAGNOSIS — F17200 Nicotine dependence, unspecified, uncomplicated: Secondary | ICD-10-CM | POA: Insufficient documentation

## 2016-10-15 DIAGNOSIS — E1165 Type 2 diabetes mellitus with hyperglycemia: Secondary | ICD-10-CM

## 2016-10-15 DIAGNOSIS — L97512 Non-pressure chronic ulcer of other part of right foot with fat layer exposed: Secondary | ICD-10-CM

## 2016-10-15 DIAGNOSIS — F17218 Nicotine dependence, cigarettes, with other nicotine-induced disorders: Secondary | ICD-10-CM

## 2016-10-15 HISTORY — DX: Type 2 diabetes mellitus with foot ulcer: E11.621

## 2016-10-15 HISTORY — DX: Type 2 diabetes mellitus with foot ulcer: L97.509

## 2016-10-15 NOTE — Progress Notes (Signed)
BP 112/70   Pulse 90   Temp 97.7 F (36.5 C)   Ht 5\' 6"  (1.676 m)   Wt (!) 341 lb 12 oz (155 kg)   LMP 09/10/2016 (Approximate)   SpO2 95%   BMI 55.16 kg/m    Subjective:    Patient ID: Rachel George, female    DOB: [birth year], 35 y.o.   MRN: [id number]  HPI: Rachel George is a 35 y.o. female presenting on 10/15/2016 for Diabetes   HPI   Pt says that wound clinic debrided toe and gave her some salve and she is using a dry wrap until she follows up this Thursday.    Pt brings in bs log today.  Morning fbs range 183-280, lunch 204 and 212, suppertime range 195-255, hs 193-244.  Relevant past medical, surgical, family and social history reviewed and updated as indicated. Interim medical history since our last visit reviewed. Allergies and medications reviewed and updated.   Current Outpatient Prescriptions:  .  albuterol (PROVENTIL HFA;VENTOLIN HFA) 108 (90 Base) MCG/ACT inhaler, Inhale 2 puffs into the lungs every 6 (six) hours as needed for wheezing or shortness of breath., Disp: , Rfl:  .  bismuth subsalicylate (PEPTO BISMOL) 262 MG/15ML suspension, Take 30 mLs by mouth every 6 (six) hours as needed., Disp: , Rfl:  .  diclofenac (VOLTAREN) 75 MG EC tablet, Take 1 tablet (75 mg total) by mouth 2 (two) times daily as needed., Disp: 30 tablet, Rfl: 0 .  diphenhydrAMINE (BENADRYL) 25 MG tablet, Take 1 tablet (25 mg total) by mouth every 6 (six) hours. (Patient taking differently: Take 25 mg by mouth every 6 (six) hours as needed for allergies. ), Disp: 20 tablet, Rfl: 0 .  diphenoxylate-atropine (LOMOTIL) 2.5-0.025 MG tablet, Take 1 tablet by mouth 4 (four) times daily as needed for diarrhea or loose stools., Disp: 30 tablet, Rfl: 0 .  insulin aspart (NOVOLOG) 100 UNIT/ML injection, Inject 5 Units into the skin 3 (three) times daily before meals. Reported on 04/16/2016, Disp: , Rfl:  .  insulin detemir (LEVEMIR) 100 UNIT/ML injection, Inject 25 Units into the skin 2 (two) times  daily. Reported on 04/16/2016, Disp: , Rfl:  .  loperamide (IMODIUM) 2 MG capsule, Take 2 mg by mouth as needed for diarrhea or loose stools., Disp: , Rfl:  .  metFORMIN (GLUCOPHAGE) 500 MG tablet, Take 1 tablet (500 mg total) by mouth 2 (two) times daily with a meal., Disp: 28 tablet, Rfl: 1 .  ondansetron (ZOFRAN ODT) 4 MG disintegrating tablet, Take 1 tablet (4 mg total) by mouth every 8 (eight) hours as needed for nausea., Disp: 6 tablet, Rfl: 0 .  sitaGLIPtin (JANUVIA) 100 MG tablet, Take 1 tablet (100 mg total) by mouth daily., Disp: 90 tablet, Rfl: 2 .  tetrahydrozoline-zinc (VISINE-AC) 0.05-0.25 % ophthalmic solution, Place 2 drops into both eyes 3 (three) times daily as needed., Disp: , Rfl:  .  traMADol (ULTRAM) 50 MG tablet, Take 1 tablet (50 mg total) by mouth every 6 (six) hours as needed., Disp: 15 tablet, Rfl: 0   Review of Systems  Constitutional: Positive for fatigue. Negative for appetite change, chills, diaphoresis, fever and unexpected weight change.  HENT: Negative for congestion, dental problem, drooling, ear pain, facial swelling, hearing loss, mouth sores, sneezing, sore throat, trouble swallowing and voice change.   Eyes: Positive for discharge, redness and itching. Negative for pain and visual disturbance.  Respiratory: Negative for cough, choking, shortness of breath and wheezing.   Cardiovascular:  Positive for leg swelling. Negative for chest pain and palpitations.  Gastrointestinal: Negative for abdominal pain, blood in stool, constipation, diarrhea and vomiting.  Endocrine: Negative for cold intolerance, heat intolerance and polydipsia.  Genitourinary: Negative for decreased urine volume, dysuria and hematuria.  Musculoskeletal: Positive for arthralgias. Negative for back pain and gait problem.  Skin: Negative for rash.  Allergic/Immunologic: Positive for environmental allergies.  Neurological: Positive for headaches. Negative for seizures, syncope and  light-headedness.  Hematological: Negative for adenopathy.  Psychiatric/Behavioral: Negative for agitation, dysphoric mood and suicidal ideas. The patient is not nervous/anxious.     Per HPI unless specifically indicated above     Objective:    BP 112/70   Pulse 90   Temp 97.7 F (36.5 C)   Ht 5\' 6"  (1.676 m)   Wt (!) 341 lb 12 oz (155 kg)   LMP 09/10/2016 (Approximate)   SpO2 95%   BMI 55.16 kg/m   Wt Readings from Last 3 Encounters:  10/15/16 (!) 341 lb 12 oz (155 kg)  10/09/16 (!) 340 lb (154.2 kg)  10/07/16 (!) 340 lb (154.2 kg)    Physical Exam  Constitutional: She is oriented to person, place, and time. She appears well-developed and well-nourished.  HENT:  Head: Normocephalic and atraumatic.  Neck: Neck supple.  Cardiovascular: Normal rate and regular rhythm.   Pulses:      Dorsalis pedis pulses are 2+ on the right side.  Pulmonary/Chest: Effort normal and breath sounds normal.  Musculoskeletal: She exhibits edema (R ankle, 1+.  decreased from last week).       Right foot: There is tenderness. There is no swelling.       Feet:  R great toe is with less swelling than it was last week- at this time no swelling appreciated.  The toe has been debrided.  There is no odor.  On plantar surface is fresh new tissue.  Lymphadenopathy:    She has no cervical adenopathy.  Neurological: She is alert and oriented to person, place, and time.  Skin: Skin is warm and dry.  Psychiatric: She has a normal mood and affect. Her behavior is normal.  Vitals reviewed.       Assessment & Plan:   Encounter Diagnoses  Name Primary?  Marland Kitchen. Uncontrolled type 2 diabetes mellitus with complication, unspecified long term insulin use status (HCC) Yes  . Diabetic ulcer of toe of right foot associated with type 2 diabetes mellitus, with fat layer exposed (HCC)   . Cigarette nicotine dependence with other nicotine-induced disorder   . Morbid obesity (HCC)     -Change levemir to 50 u qhs and  novolog to 10u qac.  Pt is reminded to call office for fbs >300 or < 70. -Follow up with wound clinic as scheduled.  Pt reminded to call for any changes in the toe like drainage or fever -Counseled on smoking cessation.   Discussed how smoking will make it more difficult for her toe to heal and overall will contribute to poorer health -pt to follow up one week with her bs log

## 2016-10-17 ENCOUNTER — Ambulatory Visit: Payer: Self-pay | Admitting: Surgery

## 2016-10-21 ENCOUNTER — Encounter: Payer: Self-pay | Admitting: Surgery

## 2016-10-21 ENCOUNTER — Encounter: Payer: Self-pay | Admitting: Physician Assistant

## 2016-10-21 ENCOUNTER — Ambulatory Visit: Payer: Self-pay | Admitting: Physician Assistant

## 2016-10-21 VITALS — BP 129/74 | HR 97 | Temp 97.5°F | Ht 66.0 in | Wt 335.5 lb

## 2016-10-21 DIAGNOSIS — E1165 Type 2 diabetes mellitus with hyperglycemia: Secondary | ICD-10-CM

## 2016-10-21 DIAGNOSIS — E11621 Type 2 diabetes mellitus with foot ulcer: Secondary | ICD-10-CM

## 2016-10-21 DIAGNOSIS — R609 Edema, unspecified: Secondary | ICD-10-CM

## 2016-10-21 DIAGNOSIS — F17218 Nicotine dependence, cigarettes, with other nicotine-induced disorders: Secondary | ICD-10-CM

## 2016-10-21 DIAGNOSIS — L97512 Non-pressure chronic ulcer of other part of right foot with fat layer exposed: Secondary | ICD-10-CM

## 2016-10-21 DIAGNOSIS — E1142 Type 2 diabetes mellitus with diabetic polyneuropathy: Secondary | ICD-10-CM

## 2016-10-21 DIAGNOSIS — E118 Type 2 diabetes mellitus with unspecified complications: Principal | ICD-10-CM

## 2016-10-21 NOTE — Progress Notes (Signed)
BP 129/74 (BP Location: Left Arm, Patient Position: Sitting, Cuff Size: Large)   Pulse 97   Temp 97.5 F (36.4 C) (Other (Comment))   Ht 5\' 6"  (1.676 m)   Wt (!) 335 lb 8 oz (152.2 kg)   LMP 10/07/2016   SpO2 98%   BMI 54.15 kg/m    Subjective:    Patient ID: Rachel George, female    DOB: October 08, 1981, 35 y.o.   MRN: [id number]  HPI: Rachel George is a 35 y.o. female presenting on 10/21/2016 for Diabetes   HPI   She went to wound clinic this morning.  She got a cast shoe and some more debriding.  She has f/u there 11/01/16.  bs log reviewed.  Improving.  Only one reading  > 200  Pt got apporved for [facility] discount- she doesn't remember what percent.    Relevant past medical, surgical, family and social history reviewed and updated as indicated. Interim medical history since our last visit reviewed. Allergies and medications reviewed and updated.   Current Outpatient Prescriptions:  .  albuterol (PROVENTIL HFA;VENTOLIN HFA) 108 (90 Base) MCG/ACT inhaler, Inhale 2 puffs into the lungs every 6 (six) hours as needed for wheezing or shortness of breath., Disp: , Rfl:  .  bismuth subsalicylate (PEPTO BISMOL) 262 MG/15ML suspension, Take 30 mLs by mouth every 6 (six) hours as needed., Disp: , Rfl:  .  diclofenac (VOLTAREN) 75 MG EC tablet, Take 1 tablet (75 mg total) by mouth 2 (two) times daily as needed., Disp: 30 tablet, Rfl: 0 .  diphenhydrAMINE (BENADRYL) 25 MG tablet, Take 1 tablet (25 mg total) by mouth every 6 (six) hours. (Patient taking differently: Take 25 mg by mouth every 6 (six) hours as needed for allergies. ), Disp: 20 tablet, Rfl: 0 .  diphenoxylate-atropine (LOMOTIL) 2.5-0.025 MG tablet, Take 1 tablet by mouth 4 (four) times daily as needed for diarrhea or loose stools., Disp: 30 tablet, Rfl: 0 .  insulin aspart (NOVOLOG) 100 UNIT/ML injection, Inject 10 Units into the skin 3 (three) times daily before meals. Reported on 04/16/2016, Disp: , Rfl:  .  insulin detemir  (LEVEMIR) 100 UNIT/ML injection, Inject 50 Units into the skin at bedtime. Reported on 04/16/2016, Disp: , Rfl:  .  loperamide (IMODIUM) 2 MG capsule, Take 2 mg by mouth as needed for diarrhea or loose stools., Disp: , Rfl:  .  metFORMIN (GLUCOPHAGE) 500 MG tablet, Take 1 tablet (500 mg total) by mouth 2 (two) times daily with a meal., Disp: 28 tablet, Rfl: 1 .  ondansetron (ZOFRAN ODT) 4 MG disintegrating tablet, Take 1 tablet (4 mg total) by mouth every 8 (eight) hours as needed for nausea., Disp: 6 tablet, Rfl: 0 .  sitaGLIPtin (JANUVIA) 100 MG tablet, Take 1 tablet (100 mg total) by mouth daily., Disp: 90 tablet, Rfl: 2 .  tetrahydrozoline-zinc (VISINE-AC) 0.05-0.25 % ophthalmic solution, Place 2 drops into both eyes 3 (three) times daily as needed., Disp: , Rfl:  .  traMADol (ULTRAM) 50 MG tablet, Take 1 tablet (50 mg total) by mouth every 6 (six) hours as needed., Disp: 15 tablet, Rfl: 0   Review of Systems  Constitutional: Positive for fatigue. Negative for appetite change, chills, diaphoresis, fever and unexpected weight change.  HENT: Negative for congestion, dental problem, drooling, ear pain, facial swelling, hearing loss, mouth sores, sneezing, sore throat, trouble swallowing and voice change.   Eyes: Positive for discharge, redness and itching. Negative for pain and visual disturbance.  Respiratory: Negative  for cough, choking, shortness of breath and wheezing.   Cardiovascular: Positive for leg swelling. Negative for chest pain and palpitations.  Gastrointestinal: Negative for abdominal pain, blood in stool, constipation, diarrhea and vomiting.  Endocrine: Negative for cold intolerance, heat intolerance and polydipsia.  Genitourinary: Negative for decreased urine volume, dysuria and hematuria.  Musculoskeletal: Positive for arthralgias and gait problem. Negative for back pain.  Skin: Negative for rash.  Allergic/Immunologic: Positive for environmental allergies.  Neurological:  Positive for headaches. Negative for seizures, syncope and light-headedness.  Hematological: Negative for adenopathy.  Psychiatric/Behavioral: Positive for agitation. Negative for dysphoric mood and suicidal ideas. The patient is not nervous/anxious.     Per HPI unless specifically indicated above     Objective:    BP 129/74 (BP Location: Left Arm, Patient Position: Sitting, Cuff Size: Large)   Pulse 97   Temp 97.5 F (36.4 C) (Other (Comment))   Ht 5\' 6"  (1.676 m)   Wt (!) 335 lb 8 oz (152.2 kg)   LMP 10/07/2016   SpO2 98%   BMI 54.15 kg/m   Wt Readings from Last 3 Encounters:  10/21/16 (!) 335 lb 8 oz (152.2 kg)  10/15/16 (!) 341 lb 12 oz (155 kg)  10/09/16 (!) 340 lb (154.2 kg)    Physical Exam  Constitutional: She is oriented to person, place, and time. She appears well-developed and well-nourished.  HENT:  Head: Normocephalic and atraumatic.  Neck: Neck supple.  Cardiovascular: Normal rate and regular rhythm.   Pulmonary/Chest: Effort normal and breath sounds normal.  Abdominal: Soft. Bowel sounds are normal. She exhibits no mass. There is no hepatosplenomegaly. There is no tenderness.  Musculoskeletal: She exhibits edema (RLE).  Lymphadenopathy:    She has no cervical adenopathy.  Neurological: She is alert and oriented to person, place, and time.  Skin: Skin is warm and dry.  Psychiatric: She has a normal mood and affect. Her behavior is normal.  Vitals reviewed.       Assessment & Plan:   Encounter Diagnoses  Name Primary?  Marland Kitchen. Uncontrolled type 2 diabetes mellitus with complication, unspecified long term insulin use status (HCC) Yes  . Edema, unspecified type      -Increase levemir to 55u qhs.   [place] novolog 10u tid ac. Pt reminded to notify office for fbs >300 or < 70  -Pt to let us know what percent [facility] discount she has  -Order doppler test to check RLE edema  -follow up 3 weeks with bs log. RTO sooner prn

## 2016-10-21 NOTE — Progress Notes (Signed)
Rachel George, Chastity ([id number]) Visit Report for 10/21/2016 Arrival Information Details Patient Name: Rachel George, Alnita Date of Service: 10/21/2016 8:00 AM Medical Record Number: [id number] Patient Account Number: [id number] Date of Birth/Sex: [birth year] (35 y.o. Female) Treating RN: Huel CoventryWoody, Kim Primary Care Physician: Jacquelin HawkingMCELROY, SHANNON Other Clinician: Referring Physician: Jacquelin HawkingMCELROY, SHANNON Treating Physician/Extender: Rudene ReBritto, Errol Weeks in Treatment: 1 Visit Information History Since Last Visit Added or deleted any medications: No Patient Arrived: Ambulatory Any new allergies or adverse reactions: No Arrival Time: 08:18 Had a fall or experienced change in No Accompanied By: self activities of daily living that may affect Transfer Assistance: None risk of falls: Patient Identification Verified: Yes Signs or symptoms of abuse/neglect since last No Secondary Verification Process Yes visito Completed: Hospitalized since last visit: No Patient Requires Transmission-Based No Has Dressing in Place as Prescribed: Yes Precautions: Pain Present Now: No Patient Has Alerts: No Electronic Signature(s) Signed: 10/21/2016 1:17:45 PM By: Elliot GurneyWoody, RN, BSN, Kim RN, BSN Entered By: Elliot GurneyWoody, RN, BSN, Kim on 10/21/2016 08:18:57 Rachel BarkerOBERTSON, Meko ([id number]) -------------------------------------------------------------------------------- Encounter Discharge Information Details Patient Name: Rachel BarkerOBERTSON, Lupie Date of Service: 10/21/2016 8:00 AM Medical Record Number: [id number] Patient Account Number: [id number] Date of Birth/Sex: [birth year] (35 y.o. Female) Treating RN: Huel CoventryWoody, Kim Primary Care Physician: Jacquelin HawkingMCELROY, SHANNON Other Clinician: Referring Physician: Jacquelin HawkingMCELROY, SHANNON Treating Physician/Extender: Rudene ReBritto, Errol Weeks in Treatment: 1 Encounter Discharge Information Items Discharge Pain Level: 0 Discharge Condition: Stable Ambulatory Status: Ambulatory Discharge Destination:  Home Transportation: Private Auto Accompanied By: self Schedule Follow-up Appointment: Yes Medication Reconciliation completed and provided to Patient/Care Yes Shannell Mikkelsen: Provided on Clinical Summary of Care: 10/21/2016 Form Type Recipient Paper Patient KR Electronic Signature(s) Signed: 10/21/2016 1:17:45 PM By: Elliot GurneyWoody, RN, BSN, Kim RN, BSN Previous Signature: 10/21/2016 8:45:23 AM Version By: Gwenlyn PerkingMoore, Shelia Entered By: Elliot GurneyWoody, RN, BSN, Kim on 10/21/2016 08:46:35 Rachel BarkerOBERTSON, Taneil ([id number]) -------------------------------------------------------------------------------- Lower Extremity Assessment Details Patient Name: Rachel BarkerOBERTSON, Lynnsie Date of Service: 10/21/2016 8:00 AM Medical Record Number: [id number] Patient Account Number: [id number] Date of Birth/Sex: [birth year] (35 y.o. Female) Treating RN: Huel CoventryWoody, Kim Primary Care Physician: Jacquelin HawkingMCELROY, SHANNON Other Clinician: Referring Physician: Jacquelin HawkingMCELROY, SHANNON Treating Physician/Extender: Rudene ReBritto, Errol Weeks in Treatment: 1 Vascular Assessment Pulses: Posterior Tibial Dorsalis Pedis Palpable: [Right:Yes] Extremity colors, hair growth, and conditions: Extremity Color: [Right:Normal] Hair Growth on Extremity: [Right:No] Temperature of Extremity: [Right:Warm] Capillary Refill: [Right:< 3 seconds] Dependent Rubor: [Right:No] Blanched when Elevated: [Right:No] Lipodermatosclerosis: [Right:No] Toe Nail Assessment Left: Right: Thick: Yes Discolored: Yes Deformed: Yes Improper Length and Hygiene: No Electronic Signature(s) Signed: 10/21/2016 1:17:45 PM By: Elliot GurneyWoody, RN, BSN, Kim RN, BSN Entered By: Elliot GurneyWoody, RN, BSN, Kim on 10/21/2016 08:24:47 Rachel BarkerOBERTSON, Devlynn ([id number]) -------------------------------------------------------------------------------- Multi Wound Chart Details Patient Name: Rachel BarkerOBERTSON, Geniva Date of Service: 10/21/2016 8:00 AM Medical Record Number: [id number] Patient Account Number: [id number] Date of Birth/Sex:  [birth year] (35 y.o. Female) Treating RN: Huel CoventryWoody, Kim Primary Care Physician: Jacquelin HawkingMCELROY, SHANNON Other Clinician: Referring Physician: Jacquelin HawkingMCELROY, SHANNON Treating Physician/Extender: Rudene ReBritto, Errol Weeks in Treatment: 1 Vital Signs Height(in): 67 Pulse(bpm): 88 Weight(lbs): 343 Blood Pressure 112/88 (mmHg): Body Mass Index(BMI): 54 Temperature(F): 97.8 Respiratory Rate 16 (breaths/min): Photos: [N/A:N/A] Wound Location: Right Toe Great - Dorsal N/A N/A Wounding Event: Gradually Appeared N/A N/A Primary Etiology: Diabetic Wound/Ulcer of N/A N/A the Lower Extremity Comorbid History: Hypertension, Type II N/A N/A Diabetes, Neuropathy Date Acquired: 09/11/2016 N/A N/A Weeks of Treatment: 1 N/A N/A Wound Status: Open N/A N/A Measurements L x W x D 2x1.2x0.1 N/A N/A (cm) Area (cm) : 1.885 N/A N/A Volume (cm) : 0.188 N/A N/A % Reduction in  Area: 60.00% N/A N/A % Reduction in Volume: 92.00% N/A N/A Classification: Grade 2 N/A N/A Exudate Amount: Large N/A N/A Exudate Type: Serous N/A N/A Exudate Color: amber N/A N/A Wound Margin: Thickened N/A N/A Granulation Amount: Small (1-33%) N/A N/A Granulation Quality: Pink N/A N/A Necrotic Amount: Small (1-33%) N/A N/A Exposed Structures: N/A N/A ABRIE, EGLOFF ([id number]) Fat: Yes Fascia: No Tendon: No Muscle: No Joint: No Bone: No Epithelialization: Small (1-33%) N/A N/A Periwound Skin Texture: Callus: Yes N/A N/A Edema: No Excoriation: No Induration: No Crepitus: No Fluctuance: No Friable: No Rash: No Scarring: No Periwound Skin Moist: Yes N/A N/A Moisture: Dry/Scaly: Yes Maceration: No Periwound Skin Color: Atrophie Blanche: No N/A N/A Cyanosis: No Ecchymosis: No Erythema: No Hemosiderin Staining: No Mottled: No Pallor: No Rubor: No Temperature: No Abnormality N/A N/A Tenderness on No N/A N/A Palpation: Wound Preparation: Ulcer Cleansing: N/A N/A Rinsed/Irrigated with Saline Topical  Anesthetic Applied: Other: lidocaine 4% Treatment Notes Electronic Signature(s) Signed: 10/21/2016 1:17:45 PM By: Elliot Gurney, RN, BSN, Kim RN, BSN Entered By: Elliot Gurney, RN, BSN, Kim on 10/21/2016 08:34:46 RAYNEE, MCCASLAND ([id number]) -------------------------------------------------------------------------------- Multi-Disciplinary Care Plan Details Patient Name: Rachel Barker Date of Service: 10/21/2016 8:00 AM Medical Record Number: [id number] Patient Account Number: [id number] Date of Birth/Sex: [birth year] (35 y.o. Female) Treating RN: Huel Coventry Primary Care Physician: Jacquelin Hawking Other Clinician: Referring Physician: Jacquelin Hawking Treating Physician/Extender: Rudene Re in Treatment: 1 Active Inactive Abuse / Safety / Falls / Self Care Management Nursing Diagnoses: Impaired home maintenance Impaired physical mobility Knowledge deficit related to: safety; personal, health (wound), emergency Potential for falls Self care deficit: actual or potential Goals: Patient/caregiver will verbalize understanding of skin care regimen Date Initiated: 10/10/2016 Goal Status: Active Patient/caregiver will verbalize/demonstrate measure taken to improve self care Date Initiated: 10/10/2016 Goal Status: Active Patient/caregiver will verbalize/demonstrate measures taken to improve the patient's personal safety Date Initiated: 10/10/2016 Goal Status: Active Patient/caregiver will verbalize/demonstrate measures taken to prevent injury and/or falls Date Initiated: 10/10/2016 Goal Status: Active Patient/caregiver will verbalize/demonstrate understanding of what to do in case of emergency Date Initiated: 10/10/2016 Goal Status: Active Interventions: Assess fall risk on admission and as needed Assess: immobility, friction, shearing, incontinence upon admission and as needed Assess impairment of mobility on admission and as needed per policy Assess self care needs on admission and as  needed Provide education on basic hygiene Provide education on fall prevention Provide education on personal and home safety Provide education on safe transfers MAIE, KESINGER ([id number]) Treatment Activities: Education provided on Basic Hygiene : 10/10/2016 Notes: Orientation to the Wound Care Program Nursing Diagnoses: Knowledge deficit related to the wound healing center program Goals: Patient/caregiver will verbalize understanding of the Wound Healing Center Program Date Initiated: 10/10/2016 Goal Status: Active Interventions: Provide education on orientation to the wound center Notes: Wound/Skin Impairment Nursing Diagnoses: Impaired tissue integrity Knowledge deficit related to smoking impact on wound healing Knowledge deficit related to ulceration/compromised skin integrity Goals: Patient will demonstrate a reduced rate of smoking or cessation of smoking Date Initiated: 10/10/2016 Goal Status: Active Patient/caregiver will verbalize understanding of skin care regimen Date Initiated: 10/10/2016 Goal Status: Active Ulcer/skin breakdown will have a volume reduction of 30% by week 4 Date Initiated: 10/10/2016 Goal Status: Active Ulcer/skin breakdown will have a volume reduction of 50% by week 8 Date Initiated: 10/10/2016 Goal Status: Active Ulcer/skin breakdown will have a volume reduction of 80% by week 12 Date Initiated: 10/10/2016 Goal Status: Active Ulcer/skin breakdown will heal within 14 weeks Date Initiated: 10/10/2016 Goal Status:  Active Rachel George, Tammye ([id number]) Interventions: Assess patient/caregiver ability to obtain necessary supplies Assess patient/caregiver ability to perform ulcer/skin care regimen upon admission and as needed Assess ulceration(s) every visit Provide education on smoking Provide education on ulcer and skin care Treatment Activities: Referred to DME Kadience Macchi for dressing supplies : 10/10/2016 Skin care regimen initiated :  10/10/2016 Topical wound management initiated : 10/10/2016 Notes: Electronic Signature(s) Signed: 10/21/2016 1:17:45 PM By: Elliot GurneyWoody, RN, BSN, Kim RN, BSN Entered By: Elliot GurneyWoody, RN, BSN, Kim on 10/21/2016 08:33:05 Rachel BarkerOBERTSON, Connor ([id number]) -------------------------------------------------------------------------------- Pain Assessment Details Patient Name: Rachel BarkerOBERTSON, Jakaylah Date of Service: 10/21/2016 8:00 AM Medical Record Number: [id number] Patient Account Number: [id number] Date of Birth/Sex: [birth year] (35 y.o. Female) Treating RN: Huel CoventryWoody, Kim Primary Care Physician: Jacquelin HawkingMCELROY, SHANNON Other Clinician: Referring Physician: Jacquelin HawkingMCELROY, SHANNON Treating Physician/Extender: Rudene ReBritto, Errol Weeks in Treatment: 1 Active Problems Location of Pain Severity and Description of Pain Patient Has Paino No Site Locations With Dressing Change: No Duration of the Pain. Constant / Intermittento Constant Rate the pain. Current Pain Level: 5 Worst Pain Level: 5 Least Pain Level: 9 Character of Pain Describe the Pain: Burning, Throbbing Pain Management and Medication Current Pain Management: Goals for Pain Management Topical or injectable lidocaine is offered to patient for acute pain when surgical debridement is performed. If needed, Patient is instructed to use over the counter pain medication for the following 24-48 hours after debridement. Wound care MDs do not prescribed pain medications. Patient has chronic pain or uncontrolled pain. Patient has been instructed to make an appointment with their Primary Care Physician for pain management. Electronic Signature(s) Signed: 10/21/2016 1:17:45 PM By: Elliot GurneyWoody, RN, BSN, Kim RN, BSN Entered By: Elliot GurneyWoody, RN, BSN, Kim on 10/21/2016 08:20:46 Rachel George, Dmya ([id number]) -------------------------------------------------------------------------------- Patient/Caregiver Education Details Patient Name: Rachel BarkerOBERTSON, Chee Date of Service: 10/21/2016 8:00 AM Medical  Record Number: [id number] Patient Account Number: [id number] Date of Birth/Gender: [birth year] (35 y.o. Female) Treating RN: Huel CoventryWoody, Kim Primary Care Physician: Jacquelin HawkingMCELROY, SHANNON Other Clinician: Referring Physician: Jacquelin HawkingMCELROY, SHANNON Treating Physician/Extender: Rudene ReBritto, Errol Weeks in Treatment: 1 Education Assessment Education Provided To: Patient Education Topics Provided Smoking and Wound Healing: Handouts: Smoking and Wound Healing Methods: Explain/Verbal Responses: State content correctly Wound/Skin Impairment: Handouts: Caring for Your Ulcer, Other: continue wound care as prescribed Methods: Demonstration Responses: State content correctly Electronic Signature(s) Signed: 10/21/2016 1:17:45 PM By: Elliot GurneyWoody, RN, BSN, Kim RN, BSN Entered By: Elliot GurneyWoody, RN, BSN, Kim on 10/21/2016 08:47:18 Rachel BarkerOBERTSON, Hilaria ([id number]) -------------------------------------------------------------------------------- Wound Assessment Details Patient Name: Rachel BarkerOBERTSON, Jeweldean Date of Service: 10/21/2016 8:00 AM Medical Record Number: [id number] Patient Account Number: [id number] Date of Birth/Sex: [birth year] (35 y.o. Female) Treating RN: Huel CoventryWoody, Kim Primary Care Physician: Jacquelin HawkingMCELROY, SHANNON Other Clinician: Referring Physician: Jacquelin HawkingMCELROY, SHANNON Treating Physician/Extender: Rudene ReBritto, Errol Weeks in Treatment: 1 Wound Status Wound Number: 1 Primary Diabetic Wound/Ulcer of the Lower Etiology: Extremity Wound Location: Right Toe Great - Dorsal Wound Status: Open Wounding Event: Gradually Appeared Comorbid Hypertension, Type II Diabetes, Date Acquired: 09/11/2016 History: Neuropathy Weeks Of Treatment: 1 Clustered Wound: No Photos Wound Measurements Length: (cm) 2 Width: (cm) 1.2 Depth: (cm) 0.1 Area: (cm) 1.885 Volume: (cm) 0.188 % Reduction in Area: 60% % Reduction in Volume: 92% Epithelialization: Small (1-33%) Tunneling: No Undermining: No Wound Description Classification: Grade 2 Wound  Margin: Thickened Exudate Amount: Large Exudate Type: Serous Exudate Color: amber Foul Odor After Cleansing: No Wound Bed Granulation Amount: Small (1-33%) Exposed Structure Granulation Quality: Pink Fascia Exposed: No Necrotic Amount: Small (1-33%) Fat Layer Exposed: Yes Necrotic Quality: Adherent Slough Tendon Exposed: No Muscle  Exposed: No Joint Exposed: No Bone Exposed: No Carreras, Darly ([id number]) Periwound Skin Texture Texture Color No Abnormalities Noted: No No Abnormalities Noted: No Callus: Yes Atrophie Blanche: No Crepitus: No Cyanosis: No Excoriation: No Ecchymosis: No Fluctuance: No Erythema: No Friable: No Hemosiderin Staining: No Induration: No Mottled: No Localized Edema: No Pallor: No Rash: No Rubor: No Scarring: No Temperature / Pain Moisture Temperature: No Abnormality No Abnormalities Noted: No Dry / Scaly: Yes Maceration: No Moist: Yes Wound Preparation Ulcer Cleansing: Rinsed/Irrigated with Saline Topical Anesthetic Applied: Other: lidocaine 4%, Treatment Notes Wound #1 (Right, Dorsal Toe Great) 1. Cleansed with: Clean wound with Normal Saline 2. Anesthetic Topical Lidocaine 4% cream to wound bed prior to debridement 4. Dressing Applied: Aquacel Ag 5. Secondary Dressing Applied Gauze and Kerlix/Conform 7. Secured with Tape Notes tennis shoes-patient states she wears darco at home, but it was to cold to wear it outside today. Electronic Signature(s) Signed: 10/21/2016 1:17:45 PM By: Elliot Gurney, RN, BSN, Kim RN, BSN Entered By: Elliot Gurney, RN, BSN, Kim on 10/21/2016 08:27:08 KALIA, VAHEY ([id number]) -------------------------------------------------------------------------------- Vitals Details Patient Name: Rachel Barker Date of Service: 10/21/2016 8:00 AM Medical Record Number: [id number] Patient Account Number: [id number] Date of Birth/Sex: 21-Mar-1981 (35 y.o. Female) Treating RN: Huel Coventry Primary Care Physician: Jacquelin Hawking Other Clinician: Referring Physician: Jacquelin Hawking Treating Physician/Extender: Rudene Re in Treatment: 1 Vital Signs Time Taken: 08:18 Temperature (F): 97.8 Height (in): 67 Pulse (bpm): 88 Weight (lbs): 343 Respiratory Rate (breaths/min): 16 Body Mass Index (BMI): 53.7 Blood Pressure (mmHg): 112/88 Reference Range: 80 - 120 mg / dl Electronic Signature(s) Signed: 10/21/2016 1:17:45 PM By: Elliot Gurney, RN, BSN, Kim RN, BSN Entered By: Elliot Gurney, RN, BSN, Kim on 10/21/2016 08:21:36

## 2016-10-22 NOTE — Progress Notes (Signed)
NEERA, TENG ([id number]) Visit Report for 10/21/2016 Chief Complaint Document Details Patient Name: Rachel George, Rachel George 10/21/2016 8:00 Date of Service: AM Medical Record [id number] Number: Patient Account Number: [id number] 03-03-1981 (35 y.o. Treating RN: Huel Coventry Date of Birth/Sex: Female) Other Clinician: Primary Care Physician: Jacquelin Hawking Treating Reign Bartnick Referring Physician: Jacquelin Hawking Physician/Extender: Tania Ade in Treatment: 1 Information Obtained from: Patient Chief Complaint Patients presents for treatment of an open diabetic ulcer the right big toe which she's had for about 6 weeks Electronic Signature(s) Signed: 10/21/2016 8:51:23 AM By: Evlyn Kanner MD, FACS Entered By: Evlyn Kanner on 10/21/2016 08:51:22 Dayna Barker ([id number]) -------------------------------------------------------------------------------- Debridement Details Patient Name: Rachel George 10/21/2016 8:00 Date of Service: AM Medical Record [id number] Number: Patient Account Number: [id number] 1981-01-25 (35 y.o. Treating RN: Huel Coventry Date of Birth/Sex: Female) Other Clinician: Primary Care Physician: Jacquelin Hawking Treating Marinell Igarashi Referring Physician: Jacquelin Hawking Physician/Extender: Tania Ade in Treatment: 1 Debridement Performed for Wound #1 Right,Dorsal Toe Great Assessment: Performed By: Physician Evlyn Kanner, MD Debridement: Debridement Pre-procedure Yes - 08:27 Verification/Time Out Taken: Start Time: 08:28 Pain Control: Other : lidocaine 4% Level: Skin/Subcutaneous Tissue Total Area Debrided (L x 2 (cm) x 1.2 (cm) = 2.4 (cm) W): Tissue and other Non-Viable, Fibrin/Slough, Subcutaneous material debrided: Instrument: Curette Bleeding: Minimum Hemostasis Achieved: Pressure End Time: 08:34 Procedural Pain: 0 Post Procedural Pain: 0 Response to Treatment: Procedure was tolerated well Post Debridement Measurements of Total  Wound Length: (cm) 2 Width: (cm) 1.2 Depth: (cm) 0.2 Volume: (cm) 0.377 Character of Wound/Ulcer Post Requires Further Debridement Debridement: Severity of Tissue Post Debridement: Fat layer exposed Post Procedure Diagnosis Same as Pre-procedure Electronic Signature(s) Signed: 10/21/2016 8:51:11 AM By: Evlyn Kanner MD, FACS Signed: 10/21/2016 1:17:45 PM By: Elliot Gurney RN, BSN, Kim RN, BSN Vernon, Jeffrey ([id number]) Entered By: Evlyn Kanner on 10/21/2016 08:51:11 DOROTEA, HAND ([id number]) -------------------------------------------------------------------------------- HPI Details Patient Name: George, Rachel 10/21/2016 8:00 Date of Service: AM Medical Record [id number] Number: Patient Account Number: [id number] [birth year] (35 y.o. Treating RN: Huel Coventry Date of Birth/Sex: Female) Other Clinician: Primary Care Physician: Jacquelin Hawking Treating Makaela Cando Referring Physician: Jacquelin Hawking Physician/Extender: Weeks in Treatment: 1 History of Present Illness Location: had a fall and injured her right lower extremity and right big toe Quality: Patient reports experiencing a dull pain to affected area(s). Severity: Patient states wound are getting worse. Duration: Patient has had the wound for < 6 weeks prior to presenting for treatment Timing: Pain in wound is Intermittent (comes and goes Context: The wound occurred when the patient had a fall in early October and injured her right ankle right leg and her toenail Modifying Factors: Other treatment(s) tried include:the ortho surgeons at [facility] and her PCP Associated Signs and Symptoms: Patient reports having foul odor. HPI Description: 35 year old patient who has been a diabetic for a long while was recently seen several times by her PCPs office for a cellulitis and ulcer of her right great toe which has been treated in the ER and in the clinic. that she has had hyperglycemia for a long while due to  noncompliance and she is also morbidly obese with diabetic polyneuropathy. She has been on Augmentin for a while and continues to be a smoker. Xray of the right foot done on 10/08/2016 -- IMPRESSION:Soft tissue swelling and subcutaneous air within the right great toe compatible with ongoing cellulitis.No other acute osseous finding or evidence of definite osteomyelitis by plain radiography. No radiopaque foreign body. Last Hemoglobin A1c done on 09/12/2016 -- was 11%. She  has had treatment for her ankle and leg and has had old fractures there which are being treated conservatively and she will follow-up with her orthopedic specialist at [facility]. 10/21/2016 -- patient has not been here since her first visit and it's about 3 weeks since then. She said she had social economic reasons for not getting here and has been keeping well. Her insulin regimen has been changed and her sugars are running better and she is cutting down on smoking. Electronic Signature(s) Signed: 10/21/2016 8:52:06 AM By: Evlyn Kanner MD, FACS Entered By: Evlyn Kanner on 10/21/2016 08:52:06 TIMI, REESER ([id number]) -------------------------------------------------------------------------------- Physical Exam Details Patient Name: George, Rachel 10/21/2016 8:00 Date of Service: AM Medical Record [id number] Number: Patient Account Number: [id number] Oct 22, 1981 (35 y.o. Treating RN: Huel Coventry Date of Birth/Sex: Female) Other Clinician: Primary Care Physician: Jacquelin Hawking Treating Evlyn Kanner Referring Physician: Jacquelin Hawking Physician/Extender: Weeks in Treatment: 1 Constitutional . Pulse regular. Respirations normal and unlabored. Afebrile. . Eyes Nonicteric. Reactive to light. Ears, Nose, Mouth, and Throat Lips, teeth, and gums WNL.Marland Kitchen Moist mucosa without lesions. Neck supple and nontender. No palpable supraclavicular or cervical adenopathy. Normal sized without  goiter. Respiratory WNL. No retractions.. Breath sounds WNL, No rubs, rales, rhonchi, or wheeze.. Cardiovascular Heart rhythm and rate regular, no murmur or gallop.. Pedal Pulses WNL. No clubbing, cyanosis or edema. Chest Breasts symmetical and no nipple discharge.. Breast tissue WNL, no masses, lumps, or tenderness.. Lymphatic No adneopathy. No adenopathy. No adenopathy. Musculoskeletal Adexa without tenderness or enlargement.. Digits and nails w/o clubbing, cyanosis, infection, petechiae, ischemia, or inflammatory conditions.. Integumentary (Hair, Skin) No suspicious lesions. No crepitus or fluctuance. No peri-wound warmth or erythema. No masses.Marland Kitchen Psychiatric Judgement and insight Intact.. No evidence of depression, anxiety, or agitation.. Notes the toe continues to have fungal infection but there is no proximal cellulitis. The base of the ulcer looks fairly clean and subcutaneous debris was sharply removed with a #3 curet and there is no evidence of probing down to bone. Electronic Signature(s) Signed: 10/21/2016 8:53:11 AM By: Evlyn Kanner MD, FACS Entered By: Evlyn Kanner on 10/21/2016 08:53:10 CAYDANCE, KUEHNLE ([id number]) -------------------------------------------------------------------------------- Physician Orders Details Patient Name: CALISTA, CRAIN 10/21/2016 8:00 Date of Service: AM Medical Record [id number] Number: Patient Account Number: [id number] 08-16-1981 (35 y.o. Treating RN: Huel Coventry Date of Birth/Sex: Female) Other Clinician: Primary Care Physician: Jacquelin Hawking Treating Stacy Sailer Referring Physician: Jacquelin Hawking Physician/Extender: Tania Ade in Treatment: 1 Verbal / Phone Orders: Yes Clinician: Huel Coventry Read Back and Verified: Yes Diagnosis Coding Wound Cleansing Wound #1 Right,Dorsal Toe Great o Cleanse wound with mild soap and water o May Shower, gently pat wound dry prior to applying new dressing. Anesthetic Wound #1  Right,Dorsal Toe Great o Topical Lidocaine 4% cream applied to wound bed prior to debridement Secondary Dressing Wound #1 Right,Dorsal Toe Great o Gauze and Kerlix/Conform Dressing Change Frequency Wound #1 Right,Dorsal Toe Great o Change dressing every day. Follow-up Appointments Wound #1 Right,Dorsal Toe Great o Return Appointment in 1 week. Off-Loading Wound #1 Right,Dorsal Toe Great o Open toe surgical shoe with peg assist. Additional Orders / Instructions Wound #1 Right,Dorsal Toe Great o Stop Smoking o Increase protein intake. o Activity as tolerated o Other: - Zinc, MVI, ROSS, HEFFERAN ([id number]) Electronic Signature(s) Signed: 10/21/2016 1:17:45 PM By: Elliot Gurney RN, BSN, Kim RN, BSN Signed: 10/21/2016 4:55:45 PM By: Evlyn Kanner MD, FACS Entered By: Elliot Gurney RN, BSN, Kim on 10/21/2016 08:44:54 Dayna Barker ([id number]) -------------------------------------------------------------------------------- Problem List Details Patient Name: DORNING,  Brendolyn 10/21/2016 8:00 Date of Service: AM Medical Record [id number] Number: Patient Account Number: [id number] 12/10/1980 (35 y.o. 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 E66.01 Morbid (severe) obesity due to excess calories 10/10/2016 Yes F17.218 Nicotine dependence, cigarettes, with other nicotine- 10/10/2016 Yes induced disorders Inactive Problems Resolved Problems Electronic Signature(s) Signed: 10/21/2016 8:49:38 AM By: Evlyn KannerBritto, Drishti Pepperman MD, FACS Entered By: Evlyn KannerBritto, Marsha Hillman on 10/21/2016  08:49:37 Dayna BarkerOBERTSON, Doretta ([id number]) -------------------------------------------------------------------------------- Progress Note Details Patient Name: Dayna BarkerROBERTSON, Rhonda 10/21/2016 8:00 Date of Service: AM Medical Record [id number] Number: Patient Account Number: [id number] 12/10/1980 (35 y.o. Treating RN: Huel CoventryWoody, Kim Date of Birth/Sex: Female) Other Clinician: Primary Care Physician: Jacquelin HawkingMCELROY, SHANNON Treating Lisa Milian Referring Physician: Jacquelin HawkingMCELROY, SHANNON Physician/Extender: Tania AdeWeeks in Treatment: 1 Subjective Chief Complaint Information obtained from Patient Patients presents for treatment of an open diabetic ulcer the right big toe which she's had for about 6 weeks History of Present Illness (HPI) The following HPI elements were documented for the patient's wound: Location: had a fall and injured her right lower extremity and right big toe Quality: Patient reports experiencing a dull pain to affected area(s). Severity: Patient states wound are getting worse. Duration: Patient has had the wound for < 6 weeks prior to presenting for treatment Timing: Pain in wound is Intermittent (comes and goes Context: The wound occurred when the patient had a fall in early October and injured her right ankle right leg and her toenail Modifying Factors: Other treatment(s) [facility] and her PCP Associated Signs and Symptoms: Patient reports having foul odor. 35 year old patient who has been a diabetic for a long while was recently seen several times by her PCPs office for a cellulitis and ulcer of her right great toe which has been treated in the ER and in the clinic. that she has had hyperglycemia for a long while due to noncompliance and she is also morbidly obese with diabetic polyneuropathy. She has been on Augmentin for a while and continues to be a smoker. Xray of the right foot done on 10/08/2016 -- IMPRESSION:Soft tissue swelling and subcutaneous  air within the right great toe compatible with ongoing cellulitis.No other acute osseous finding or evidence of definite osteomyelitis by plain radiography. No radiopaque foreign body. Last Hemoglobin A1c done on 09/12/2016 -- was 11%. She has had treatment for her ankle and leg and has had old fractures there which are being treated conservatively and she will [facility]. 10/21/2016 -- patient has not been here since her first visit and it's about 3 weeks since then. She said she had social economic reasons for not getting here and has been keeping well. Her insulin regimen has been changed and her sugars are running better and she is cutting down on smoking. Dayna BarkerROBERTSON, Antoine ([id number]) Objective Constitutional Pulse regular. Respirations normal and unlabored. Afebrile. Vitals Time Taken: 8:18 AM, Height: 67 in, Weight: 343 lbs, BMI: 53.7, Temperature: 97.8 F, Pulse: 88 bpm, Respiratory Rate: 16 breaths/min, Blood Pressure: 112/88 mmHg. Eyes Nonicteric. Reactive to light. Ears, Nose, Mouth, and Throat Lips, teeth, and gums WNL.Marland Kitchen. Moist mucosa without lesions. Neck supple and nontender. No palpable supraclavicular or cervical  adenopathy. Normal sized without goiter. Respiratory WNL. No retractions.. Breath sounds WNL, No rubs, rales, rhonchi, or wheeze.. Cardiovascular Heart rhythm and rate regular, no murmur or gallop.. Pedal Pulses WNL. No clubbing, cyanosis or edema. Chest Breasts symmetical and no nipple discharge.. Breast tissue WNL, no masses, lumps, or tenderness.. Lymphatic No adneopathy. No adenopathy. No adenopathy. Musculoskeletal Adexa without tenderness or enlargement.. Digits and nails w/o clubbing, cyanosis, infection, petechiae, ischemia, or inflammatory conditions.Marland Kitchen Psychiatric Judgement and insight Intact.. No evidence of depression, anxiety, or agitation.. General Notes: the toe continues to have fungal  infection but there is no proximal cellulitis. The base of the ulcer looks fairly clean and subcutaneous debris was sharply removed with a #3 curet and there is no evidence of probing down to bone. Integumentary (Hair, Skin) No suspicious lesions. No crepitus or fluctuance. No peri-wound warmth or erythema. No masses.. Wound #1 status is Open. Original cause of wound was Gradually Appeared. The wound is located on the Right,Dorsal [employer]. The wound measures 2cm length x 1.2cm width x 0.1cm depth; 1.885cm^2 area Lattanzio, Parul ([id number]) and 0.188cm^3 volume. There is fat exposed. There is no tunneling or undermining noted. There is a large amount of serous drainage noted. The wound margin is thickened. There is small (1-33%) pink granulation within the wound bed. There is a small (1-33%) amount of necrotic tissue within the wound bed including Adherent Slough. The periwound skin appearance exhibited: Callus, Dry/Scaly, Moist. The periwound skin appearance did not exhibit: Crepitus, Excoriation, Fluctuance, Friable, Induration, Localized Edema, Rash, Scarring, Maceration, Atrophie Blanche, Cyanosis, Ecchymosis, Hemosiderin Staining, Mottled, Pallor, Rubor, Erythema. Periwound temperature was noted as No Abnormality. Assessment Active Problems ICD-10 E11.621 - Type 2 diabetes mellitus with foot ulcer L97.512 - Non-pressure chronic ulcer of other part of right foot with fat layer exposed E66.01 - Morbid (severe) obesity due to excess calories F17.218 - Nicotine dependence, cigarettes, with other nicotine-induced disorders Procedures Wound #1 Wound #1 is a Diabetic Wound/Ulcer of the Lower Extremity located on the Right,Dorsal Toe Great . There was a Skin/Subcutaneous Tissue Debridement ([id number]) debridement with total area of 2.4 sq cm performed by Evlyn Kanner, MD. with the following instrument(s): Curette to remove Non-Viable tissue/material including Fibrin/Slough and  Subcutaneous after achieving pain control using Other (lidocaine 4%). A time out was conducted at 08:27, prior to the start of the procedure. A Minimum amount of bleeding was controlled with Pressure. The procedure was tolerated well with a pain level of 0 throughout and a pain level of 0 following the procedure. Post Debridement Measurements: 2cm length x 1.2cm width x 0.2cm depth; 0.377cm^3 volume. Character of Wound/Ulcer Post Debridement requires further debridement. Severity of Tissue Post Debridement is: Fat layer exposed. Post procedure Diagnosis Wound #1: Same as Pre-Procedure Plan Wound Cleansing: HANNIE, SHOE ([id number]) Wound #1 Right,Dorsal Toe Great: Cleanse wound with mild soap and water May Shower, gently pat wound dry prior to applying new dressing. Anesthetic: Wound #1 Right,Dorsal Toe Great: Topical Lidocaine 4% cream applied to wound bed prior to debridement Secondary Dressing: Wound #1 Right,Dorsal Toe Great: Gauze and Kerlix/Conform Dressing Change Frequency: Wound #1 Right,Dorsal Toe Great: Change dressing every day. Follow-up Appointments: Wound #1 Right,Dorsal Toe Great: Return Appointment in 1 week. Off-Loading: Wound #1 Right,Dorsal Toe Great: Open toe surgical shoe with peg assist. Additional Orders / Instructions: Wound #1 Right,Dorsal Toe Great: Stop Smoking Increase protein intake. Activity as tolerated Other: - Zinc, MVI, Vit A, C I have recommended: 1. dressing to be applied with silver alginate  and a bordered foam to be changed daily after washing with soap and water. 2. Off-Loading this toe as much as possible. 3. again discussed the need to completely give up smoking. 4. following up with [facility] regarding her ankle swelling and pain. 5. good control of her diabetes mellitus and appropriate nutrition has been discussed with her 6. Regular visits to the wound center Electronic  Signature(s) Signed: 10/21/2016 8:54:09 AM By: Evlyn KannerBritto, Salene Mohamud MD, FACS Entered By: Evlyn KannerBritto, Heber Hoog on 10/21/2016 08:54:09 Dayna BarkerROBERTSON, Carlena ([id number]) -------------------------------------------------------------------------------- SuperBill Details Patient Name: Dayna BarkerOBERTSON, Vanna Date of Service: 10/21/2016 Medical Record Number: [id number] Patient Account Number: [id number] Date of Birth/Sex: [birth year] (35 y.o. Female) Treating RN: Huel CoventryWoody, Kim Primary Care Physician: Jacquelin HawkingMCELROY, SHANNON Other Clinician: Referring Physician: Jacquelin HawkingMCELROY, SHANNON Treating Physician/Extender: Rudene ReBritto, Loreal Schuessler Weeks in Treatment: 1 Diagnosis Coding ICD-10 Codes Code Description [phone] Type 2 diabetes mellitus with foot ulcer 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t with fat layer exposed E66.01 Morbid (severe) obesity due to excess calories F17.218 Nicotine dependence, cigarettes, with other nicotine-induced disorders Facility Procedures CPT4 Code Description: [id number] 11042 - DEB SUBQ TISSUE 20 SQ CM/< ICD-10 Description Diagnosis E11.621 Type 2 diabetes mellitus with foot ulcer L97.512 Non-pressure chronic ulcer of other part of right fo Modifier: ot with fat la Quantity: 1 yer exposed Physician Procedures CPT4 Code Description: [id number] 11042 - WC PHYS SUBQ TISS 20 SQ CM ICD-10 Description Diagnosis E11.621 Type 2 diabetes mellitus with foot ulcer L97.512 Non-pressure chronic ulcer of other part of right fo Modifier: ot with fat lay Quantity: 1 er exposed Electronic Signature(s) Signed: 10/21/2016 8:54:56 AM By: Evlyn KannerBritto, Tiyona Desouza MD, FACS Previous Signature: 10/21/2016 8:54:21 AM Version By: Evlyn KannerBritto, Rosia Syme MD, FACS Entered By: Evlyn KannerBritto, Avi Kerschner on 10/21/2016 08:54:56

## 2016-10-30 ENCOUNTER — Encounter: Payer: Self-pay | Admitting: Physician Assistant

## 2016-10-30 ENCOUNTER — Ambulatory Visit: Payer: Self-pay | Admitting: Physician Assistant

## 2016-10-30 VITALS — BP 128/74 | HR 85 | Temp 97.7°F | Ht 66.0 in | Wt 345.5 lb

## 2016-10-30 DIAGNOSIS — E118 Type 2 diabetes mellitus with unspecified complications: Secondary | ICD-10-CM

## 2016-10-30 DIAGNOSIS — L97512 Non-pressure chronic ulcer of other part of right foot with fat layer exposed: Principal | ICD-10-CM

## 2016-10-30 DIAGNOSIS — E11621 Type 2 diabetes mellitus with foot ulcer: Secondary | ICD-10-CM

## 2016-10-30 DIAGNOSIS — E1165 Type 2 diabetes mellitus with hyperglycemia: Secondary | ICD-10-CM

## 2016-10-30 NOTE — Progress Notes (Signed)
BP 128/74 (BP Location: Left Arm, Patient Position: Sitting, Cuff Size: Large)   Pulse 85   Temp 97.7 F (36.5 C) (Other (Comment))   Ht 5\' 6"  (1.676 m)   Wt (!) 345 lb 8 oz (156.7 kg)   LMP 10/07/2016   SpO2 99%   BMI 55.77 kg/m    Subjective:    Patient ID: Rachel BarkerKaren Meals, female    DOB: June 27, 1981, 35 y.o.   MRN: [id number]  HPI: Rachel George is a 35 y.o. female presenting on 10/30/2016 for Follow-up (Right great toe is "purple and swelling again)   HPI   Chief Complaint  Patient presents with  . Follow-up    Right great toe is "purple and swelling again    Pt in today because she is concerned about the appearance of her toe.   She has follow up appointment this Friday with the wound clinic.  Relevant past medical, surgical, family and social history reviewed and updated as indicated. Interim medical history since our last visit reviewed. Allergies and medications reviewed and updated.   Current Outpatient Prescriptions:  .  albuterol (PROVENTIL HFA;VENTOLIN HFA) 108 (90 Base) MCG/ACT inhaler, Inhale 2 puffs into the lungs every 6 (six) hours as needed for wheezing or shortness of breath., Disp: , Rfl:  .  bismuth subsalicylate (PEPTO BISMOL) 262 MG/15ML suspension, Take 30 mLs by mouth every 6 (six) hours as needed., Disp: , Rfl:  .  diclofenac (VOLTAREN) 75 MG EC tablet, Take 1 tablet (75 mg total) by mouth 2 (two) times daily as needed., Disp: 30 tablet, Rfl: 0 .  diphenhydrAMINE (BENADRYL) 25 MG tablet, Take 1 tablet (25 mg total) by mouth every 6 (six) hours. (Patient taking differently: Take 25 mg by mouth every 6 (six) hours as needed for allergies. ), Disp: 20 tablet, Rfl: 0 .  diphenoxylate-atropine (LOMOTIL) 2.5-0.025 MG tablet, Take 1 tablet by mouth 4 (four) times daily as needed for diarrhea or loose stools., Disp: 30 tablet, Rfl: 0 .  insulin aspart (NOVOLOG) 100 UNIT/ML injection, Inject 10 Units into the skin 3 (three) times daily before meals. Reported  on 04/16/2016, Disp: , Rfl:  .  insulin detemir (LEVEMIR) 100 UNIT/ML injection, Inject 55 Units into the skin at bedtime. Reported on 04/16/2016, Disp: , Rfl:  .  loperamide (IMODIUM) 2 MG capsule, Take 2 mg by mouth as needed for diarrhea or loose stools., Disp: , Rfl:  .  metFORMIN (GLUCOPHAGE) 500 MG tablet, Take 1 tablet (500 mg total) by mouth 2 (two) times daily with a meal., Disp: 28 tablet, Rfl: 1 .  ondansetron (ZOFRAN ODT) 4 MG disintegrating tablet, Take 1 tablet (4 mg total) by mouth every 8 (eight) hours as needed for nausea., Disp: 6 tablet, Rfl: 0 .  sitaGLIPtin (JANUVIA) 100 MG tablet, Take 1 tablet (100 mg total) by mouth daily., Disp: 90 tablet, Rfl: 2 .  tetrahydrozoline-zinc (VISINE-AC) 0.05-0.25 % ophthalmic solution, Place 2 drops into both eyes 3 (three) times daily as needed., Disp: , Rfl:  .  traMADol (ULTRAM) 50 MG tablet, Take 1 tablet (50 mg total) by mouth every 6 (six) hours as needed., Disp: 15 tablet, Rfl: 0   Review of Systems  Constitutional: Positive for chills, diaphoresis and fatigue. Negative for appetite change, fever and unexpected weight change.  HENT: Negative for congestion, dental problem, drooling, ear pain, facial swelling, hearing loss, mouth sores, sneezing, sore throat, trouble swallowing and voice change.   Eyes: Positive for discharge, redness, itching and visual  disturbance. Negative for pain.  Respiratory: Negative for cough, choking, shortness of breath and wheezing.   Cardiovascular: Positive for leg swelling. Negative for chest pain and palpitations.  Gastrointestinal: Negative for abdominal pain, blood in stool, constipation, diarrhea and vomiting.  Endocrine: Positive for cold intolerance. Negative for heat intolerance and polydipsia.  Genitourinary: Negative for decreased urine volume, dysuria and hematuria.  Musculoskeletal: Positive for arthralgias and gait problem. Negative for back pain.  Skin: Negative for rash.   Allergic/Immunologic: Positive for environmental allergies.  Neurological: Positive for headaches. Negative for seizures, syncope and light-headedness.  Hematological: Negative for adenopathy.  Psychiatric/Behavioral: Positive for agitation. Negative for dysphoric mood and suicidal ideas. The patient is nervous/anxious.     Per HPI unless specifically indicated above     Objective:    BP 128/74 (BP Location: Left Arm, Patient Position: Sitting, Cuff Size: Large)   Pulse 85   Temp 97.7 F (36.5 C) (Other (Comment))   Ht 5\' 6"  (1.676 m)   Wt (!) 345 lb 8 oz (156.7 kg)   LMP 10/07/2016   SpO2 99%   BMI 55.77 kg/m   Wt Readings from Last 3 Encounters:  10/30/16 (!) 345 lb 8 oz (156.7 kg)  10/21/16 (!) 335 lb 8 oz (152.2 kg)  10/15/16 (!) 341 lb 12 oz (155 kg)    Physical Exam  Constitutional: She is oriented to person, place, and time.  Cardiovascular:  Pulses:      Dorsalis pedis pulses are 2+ on the right side.  Pulmonary/Chest: Effort normal.  Musculoskeletal: She exhibits edema (RLE).  Right great toe without odor or drainage.  Open area on plantar surface still pink and clean.  Dorsal surface of toe dark.  No swelling of the toe.  There is some mild purplish on edge of skin, difficult to see, appears to be some coloration from the medication she is using.  The toe itself is not purple.   Neurological: She is alert and oriented to person, place, and time.  Skin: Skin is warm and dry.  Psychiatric: She has a normal mood and affect. Her behavior is normal.  Nursing note and vitals reviewed.       Assessment & Plan:   Encounter Diagnoses  Name Primary?  . Diabetic ulcer of toe of right foot associated with type 2 diabetes mellitus, with fat layer exposed (HCC) Yes  . Uncontrolled type 2 diabetes mellitus with complication, unspecified long term insulin use status (HCC)    -pt to continue with care as prescribed by wound clinic -follow up with Wound clinic as  scheduled.  Discussed will await their recommendations on further treatment, testing, referrals if needed -schedule pt for annual Dm eye exam -pt to follow up here on 11/11/16 as scheduled for diabetes -RTO sooner prn worsening, changes, problems.

## 2016-11-01 ENCOUNTER — Ambulatory Visit: Payer: Self-pay | Admitting: Nurse Practitioner

## 2016-11-04 ENCOUNTER — Encounter: Payer: Self-pay | Attending: Surgery | Admitting: Surgery

## 2016-11-04 DIAGNOSIS — E11621 Type 2 diabetes mellitus with foot ulcer: Secondary | ICD-10-CM | POA: Insufficient documentation

## 2016-11-04 DIAGNOSIS — F17218 Nicotine dependence, cigarettes, with other nicotine-induced disorders: Secondary | ICD-10-CM | POA: Insufficient documentation

## 2016-11-04 DIAGNOSIS — Z6841 Body Mass Index (BMI) 40.0 and over, adult: Secondary | ICD-10-CM | POA: Insufficient documentation

## 2016-11-04 DIAGNOSIS — L97512 Non-pressure chronic ulcer of other part of right foot with fat layer exposed: Secondary | ICD-10-CM | POA: Insufficient documentation

## 2016-11-04 DIAGNOSIS — E1140 Type 2 diabetes mellitus with diabetic neuropathy, unspecified: Secondary | ICD-10-CM | POA: Insufficient documentation

## 2016-11-05 NOTE — Progress Notes (Signed)
GARNELL, BEGEMAN ([id number]) Visit Report for 11/04/2016 Arrival Information Details Patient Name: Rachel George, Rachel George Date of Service: 11/04/2016 3:45 PM Medical Record Number: [id number] Patient Account Number: [id number] Date of Birth/Sex: 1981/04/29 (35 y.o. Female) Treating RN: Phillis Haggis Primary Care Physician: Jacquelin Hawking Other Clinician: Referring Physician: Jacquelin Hawking Treating Physician/Extender: Rudene Re in Treatment: 3 Visit Information History Since Last Visit All ordered tests and consults were completed: No Patient Arrived: Ambulatory Added or deleted any medications: No Arrival Time: 15:14 Any new allergies or adverse reactions: No Accompanied By: self Had a fall or experienced change in No Transfer Assistance: None activities of daily living that may affect Patient Identification Verified: Yes risk of falls: Secondary Verification Process Yes Signs or symptoms of abuse/neglect since last No Completed: visito Patient Requires Transmission-Based No Hospitalized since last visit: No Precautions: Pain Present Now: No Patient Has Alerts: No Electronic Signature(s) Signed: 11/04/2016 5:33:36 PM By: Alejandro Mulling Entered By: Alejandro Mulling on 11/04/2016 15:14:36 Rachel George ([id number]) -------------------------------------------------------------------------------- Encounter Discharge Information Details Patient Name: Rachel George Date of Service: 11/04/2016 3:45 PM Medical Record Number: [id number] Patient Account Number: [id number] Date of Birth/Sex: January 07, 1981 (35 y.o. Female) Treating RN: Ashok Cordia, Debi Primary Care Physician: Jacquelin Hawking Other Clinician: Referring Physician: Jacquelin Hawking Treating Physician/Extender: Rudene Re in Treatment: 3 Encounter Discharge Information Items Discharge Pain Level: 0 Discharge Condition: Stable Ambulatory Status: Ambulatory Discharge Destination:  Home Transportation: Private Auto Accompanied By: self Schedule Follow-up Appointment: Yes Medication Reconciliation completed and provided to Patient/Care Yes Anjelique Makar: Provided on Clinical Summary of Care: 11/04/2016 Form Type Recipient Paper Patient KR Electronic Signature(s) Signed: 11/04/2016 4:05:54 PM By: Gwenlyn Perking Entered By: Gwenlyn Perking on 11/04/2016 16:05:54 Rachel George ([id number]) -------------------------------------------------------------------------------- Lower Extremity Assessment Details Patient Name: Rachel George Date of Service: 11/04/2016 3:45 PM Medical Record Number: [id number] Patient Account Number: [id number] Date of Birth/Sex: 1980/12/29 (35 y.o. Female) Treating RN: Ashok Cordia, Debi Primary Care Physician: Jacquelin Hawking Other Clinician: Referring Physician: Jacquelin Hawking Treating Physician/Extender: Rudene Re in Treatment: 3 Vascular Assessment Pulses: Posterior Tibial Dorsalis Pedis Palpable: [Right:Yes] Extremity colors, hair growth, and conditions: Extremity Color: [Right:Normal] Temperature of Extremity: [Right:Warm] Capillary Refill: [Right:< 3 seconds] Toe Nail Assessment Left: Right: Thick: Yes Discolored: Yes Deformed: Yes Improper Length and Hygiene: No Electronic Signature(s) Signed: 11/04/2016 5:33:36 PM By: Alejandro Mulling Entered By: Alejandro Mulling on 11/04/2016 15:15:38 Rachel George ([id number]) -------------------------------------------------------------------------------- Multi Wound Chart Details Patient Name: Rachel George Date of Service: 11/04/2016 3:45 PM Medical Record Number: [id number] Patient Account Number: [id number] Date of Birth/Sex: 12/25/1980 (35 y.o. Female) Treating RN: Ashok Cordia, Debi Primary Care Physician: Jacquelin Hawking Other Clinician: Referring Physician: Jacquelin Hawking Treating Physician/Extender: Rudene Re in Treatment: 3 Vital Signs Height(in):  67 Pulse(bpm): 79 Weight(lbs): 343 Blood Pressure 136/85 (mmHg): Body Mass Index(BMI): 54 Temperature(F): 97.9 Respiratory Rate 18 (breaths/min): Photos: [1:No Photos] [N/A:N/A] Wound Location: [1:Right Toe Great - Dorsal] [N/A:N/A] Wounding Event: [1:Gradually Appeared] [N/A:N/A] Primary Etiology: [1:Diabetic Wound/Ulcer of the Lower Extremity] [N/A:N/A] Comorbid History: [1:Hypertension, Type II Diabetes, Neuropathy] [N/A:N/A] Date Acquired: [1:09/11/2016] [N/A:N/A] Weeks of Treatment: [1:3] [N/A:N/A] Wound Status: [1:Open] [N/A:N/A] Measurements L x W x D 2.8x0.8x0.1 [N/A:N/A] (cm) Area (cm) : [1:1.759] [N/A:N/A] Volume (cm) : [1:0.176] [N/A:N/A] % Reduction in Area: [1:62.70%] [N/A:N/A] % Reduction in Volume: 92.50% [N/A:N/A] Classification: [1:Grade 2] [N/A:N/A] Exudate Amount: [1:Large] [N/A:N/A] Exudate Type: [1:Serous] [N/A:N/A] Exudate Color: [1:amber] [N/A:N/A] Wound Margin: [1:Thickened] [N/A:N/A] Granulation Amount: [1:Small (1-33%)] [N/A:N/A] Granulation Quality: [1:Pink] [N/A:N/A] Necrotic Amount: [1:Small (1-33%)] [N/A:N/A] Exposed Structures: [1:Fat: Yes Fascia:  No Tendon: No Muscle: No Joint: No Bone: No] [N/A:N/A] Epithelialization: Small (1-33%) N/A N/A Periwound Skin Texture: Callus: Yes N/A N/A Edema: No Excoriation: No Induration: No Crepitus: No Fluctuance: No Friable: No Rash: No Scarring: No Periwound Skin Moist: Yes N/A N/A Moisture: Dry/Scaly: Yes Maceration: No Periwound Skin Color: Atrophie Blanche: No N/A N/A Cyanosis: No Ecchymosis: No Erythema: No Hemosiderin Staining: No Mottled: No Pallor: No Rubor: No Temperature: No Abnormality N/A N/A Tenderness on No N/A N/A Palpation: Wound Preparation: Ulcer Cleansing: N/A N/A Rinsed/Irrigated with Saline Topical Anesthetic Applied: Other: lidocaine 4% Treatment Notes Electronic Signature(s) Signed: 11/04/2016 5:33:36 PM By: Alejandro Mulling Entered By: Alejandro Mulling  on 11/04/2016 15:22:22 Rachel George, Rachel George ([id number]) -------------------------------------------------------------------------------- Multi-Disciplinary Care Plan Details Patient Name: Rachel George Date of Service: 11/04/2016 3:45 PM Medical Record Number: [id number] Patient Account Number: [id number] Date of Birth/Sex: 21-Oct-1981 (35 y.o. Female) Treating RN: Ashok Cordia, Debi Primary Care Physician: Jacquelin Hawking Other Clinician: Referring Physician: Jacquelin Hawking Treating Physician/Extender: Rudene Re in Treatment: 3 Active Inactive Abuse / Safety / Falls / Self Care Management Nursing Diagnoses: Impaired home maintenance Impaired physical mobility Knowledge deficit related to: safety; personal, health (wound), emergency Potential for falls Self care deficit: actual or potential Goals: Patient/caregiver will verbalize understanding of skin care regimen Date Initiated: 10/10/2016 Goal Status: Active Patient/caregiver will verbalize/demonstrate measure taken to improve self care Date Initiated: 10/10/2016 Goal Status: Active Patient/caregiver will verbalize/demonstrate measures taken to improve the patient's personal safety Date Initiated: 10/10/2016 Goal Status: Active Patient/caregiver will verbalize/demonstrate measures taken to prevent injury and/or falls Date Initiated: 10/10/2016 Goal Status: Active Patient/caregiver will verbalize/demonstrate understanding of what to do in case of emergency Date Initiated: 10/10/2016 Goal Status: Active Interventions: Assess fall risk on admission and as needed Assess: immobility, friction, shearing, incontinence upon admission and as needed Assess impairment of mobility on admission and as needed per policy Assess self care needs on admission and as needed Provide education on basic hygiene Provide education on fall prevention Provide education on personal and home safety Provide education on safe transfers Rachel George, Rachel George ([id number]) Treatment Activities: Education provided on Basic Hygiene : 10/10/2016 Notes: Orientation to the Wound Care Program Nursing Diagnoses: Knowledge deficit related to the wound healing center program Goals: Patient/caregiver will verbalize understanding of the Wound Healing Center Program Date Initiated: 10/10/2016 Goal Status: Active Interventions: Provide education on orientation to the wound center Notes: Wound/Skin Impairment Nursing Diagnoses: Impaired tissue integrity Knowledge deficit related to smoking impact on wound healing Knowledge deficit related to ulceration/compromised skin integrity Goals: Patient will demonstrate a reduced rate of smoking or cessation of smoking Date Initiated: 10/10/2016 Goal Status: Active Patient/caregiver will verbalize understanding of skin care regimen Date Initiated: 10/10/2016 Goal Status: Active Ulcer/skin breakdown will have a volume reduction of 30% by week 4 Date Initiated: 10/10/2016 Goal Status: Active Ulcer/skin breakdown will have a volume reduction of 50% by week 8 Date Initiated: 10/10/2016 Goal Status: Active Ulcer/skin breakdown will have a volume reduction of 80% by week 12 Date Initiated: 10/10/2016 Goal Status: Active Ulcer/skin breakdown will heal within 14 weeks Date Initiated: 10/10/2016 Goal Status: Active Rachel George, Rachel George ([id number]) Interventions: Assess patient/caregiver ability to obtain necessary supplies Assess patient/caregiver ability to perform ulcer/skin care regimen upon admission and as needed Assess ulceration(s) every visit Provide education on smoking Provide education on ulcer and skin care Treatment Activities: Referred to DME Gidget Quizhpi for dressing supplies : 10/10/2016 Skin care regimen initiated : 10/10/2016 Topical wound management initiated : 10/10/2016 Notes: Electronic Signature(s) Signed: 11/04/2016  5:33:36 PM By: Alejandro MullingPinkerton, Debra Entered By: Alejandro MullingPinkerton, Debra on 11/04/2016  15:22:17 Rachel George, Rachel George ([id number]) -------------------------------------------------------------------------------- Pain Assessment Details Patient Name: Rachel George, Rachel George Date of Service: 11/04/2016 3:45 PM Medical Record Number: [id number] Patient Account Number: [id number] Date of Birth/Sex: 1981/05/17 (35 y.o. Female) Treating RN: Phillis HaggisPinkerton, Debi Primary Care Physician: Jacquelin HawkingMCELROY, SHANNON Other Clinician: Referring Physician: Jacquelin HawkingMCELROY, SHANNON Treating Physician/Extender: Rudene ReBritto, Errol Weeks in Treatment: 3 Active Problems Location of Pain Severity and Description of Pain Patient Has Paino No Site Locations With Dressing Change: No Pain Management and Medication Current Pain Management: Electronic Signature(s) Signed: 11/04/2016 5:33:36 PM By: Alejandro MullingPinkerton, Debra Entered By: Alejandro MullingPinkerton, Debra on 11/04/2016 15:15:14 Rachel George, Rachel George ([id number]) -------------------------------------------------------------------------------- Patient/Caregiver Education Details Patient Name: Rachel George, Rachel George Date of Service: 11/04/2016 3:45 PM Medical Record Number: [id number] Patient Account Number: [id number] Date of Birth/Gender: 1981/05/17 (35 y.o. Female) Treating RN: Ashok CordiaPinkerton, Debi Primary Care Physician: Jacquelin HawkingMCELROY, SHANNON Other Clinician: Referring Physician: Jacquelin HawkingMCELROY, SHANNON Treating Physician/Extender: Rudene ReBritto, Errol Weeks in Treatment: 3 Education Assessment Education Provided To: Patient Education Topics Provided Wound/Skin Impairment: Handouts: Other: change dressing as ordered Methods: Demonstration, Explain/Verbal Responses: State content correctly Electronic Signature(s) Signed: 11/04/2016 5:33:36 PM By: Alejandro MullingPinkerton, Debra Entered By: Alejandro MullingPinkerton, Debra on 11/04/2016 15:29:46 Rachel George, Rachel George ([id number]) -------------------------------------------------------------------------------- Wound Assessment Details Patient Name: Rachel George, Rachel George Date of Service: 11/04/2016 3:45 PM Medical  Record Number: [id number] Patient Account Number: [id number] Date of Birth/Sex: 1981/05/17 (35 y.o. Female) Treating RN: Ashok CordiaPinkerton, Debi Primary Care Physician: Jacquelin HawkingMCELROY, SHANNON Other Clinician: Referring Physician: Jacquelin HawkingMCELROY, SHANNON Treating Physician/Extender: Rudene ReBritto, Errol Weeks in Treatment: 3 Wound Status Wound Number: 1 Primary Diabetic Wound/Ulcer of the Lower Etiology: Extremity Wound Location: Right Toe Great - Circumfernential Wound Status: Open Wounding Event: Gradually Appeared Comorbid Hypertension, Type II Diabetes, History: Neuropathy Date Acquired: 09/11/2016 Weeks Of Treatment: 3 Clustered Wound: No Photos Wound Measurements Length: (cm) 2.8 Width: (cm) 0.8 Depth: (cm) 0.1 Area: (cm) 1.759 Volume: (cm) 0.176 % Reduction in Area: 62.7% % Reduction in Volume: 92.5% Epithelialization: Small (1-33%) Tunneling: No Undermining: No Wound Description Classification: Grade 2 Wound Margin: Thickened Exudate Amount: Large Exudate Type: Serous Exudate Color: amber Foul Odor After Cleansing: No Wound Bed Granulation Amount: Small (1-33%) Exposed Structure Granulation Quality: Pink Fascia Exposed: No Necrotic Amount: Small (1-33%) Fat Layer Exposed: Yes Necrotic Quality: Adherent Slough Tendon Exposed: No Stiggers, Takiah ([id number]) Muscle Exposed: No Joint Exposed: No Bone Exposed: No Periwound Skin Texture Texture Color No Abnormalities Noted: No No Abnormalities Noted: No Callus: Yes Atrophie Blanche: No Crepitus: No Cyanosis: No Excoriation: No Ecchymosis: No Fluctuance: No Erythema: No Friable: No Hemosiderin Staining: No Induration: No Mottled: No Localized Edema: No Pallor: No Rash: No Rubor: No Scarring: No Temperature / Pain Moisture Temperature: No Abnormality No Abnormalities Noted: No Dry / Scaly: Yes Maceration: No Moist: Yes Wound Preparation Ulcer Cleansing: Rinsed/Irrigated with Saline Topical Anesthetic  Applied: Other: lidocaine 4%, Treatment Notes Wound #1 (Right, Dorsal Toe Great) 1. Cleansed with: Clean wound with Normal Saline Cleanse wound with antibacterial soap and water 2. Anesthetic Topical Lidocaine 4% cream to wound bed prior to debridement 3. Peri-wound Care: Antifungal cream 4. Dressing Applied: Other dressing (specify in notes) 5. Secondary Dressing Applied Dry Gauze Kerlix/Conform Notes tennis shoes-patient states she wears darco at home, but it was to cold to wear it outside today. coban Electronic Signature(s) Signed: 11/04/2016 5:33:36 PM By: Lars MassonPinkerton, Debra Puskarich, Clydie BraunKAREN ([id number]) Entered By: Alejandro MullingPinkerton, Debra on 11/04/2016 16:22:52 Rachel BarkerROBERTSON, Sherece ([id number]) -------------------------------------------------------------------------------- Vitals Details Patient Name: Rachel George, Hafsah Date of Service: 11/04/2016 3:45 PM Medical Record Number:  [id number] Patient Account Number: [id number] Date of Birth/Sex: 10-29-1981 (35 y.o. Female) Treating RN: Ashok CordiaPinkerton, Debi Primary Care Physician: Jacquelin HawkingMCELROY, SHANNON Other Clinician: Referring Physician: Jacquelin HawkingMCELROY, SHANNON Treating Physician/Extender: Rudene ReBritto, Errol Weeks in Treatment: 3 Vital Signs Time Taken: 15:20 Temperature (F): 97.9 Height (in): 67 Pulse (bpm): 79 Weight (lbs): 343 Respiratory Rate (breaths/min): 18 Body Mass Index (BMI): 53.7 Blood Pressure (mmHg): 136/85 Reference Range: 80 - 120 mg / dl Electronic Signature(s) Signed: 11/04/2016 5:33:36 PM By: Alejandro MullingPinkerton, Debra Entered By: Alejandro MullingPinkerton, Debra on 11/04/2016 15:20:52

## 2016-11-05 NOTE — Progress Notes (Signed)
Rachel George, Rachel George ([id number]) Visit Report for 11/04/2016 Chief Complaint Document Details Patient Name: Rachel George, Rachel George Date of Service: 11/04/2016 3:45 PM Medical Record Number: [id number] Patient Account Number: [id number] Date of Birth/Sex: 06-12-1981 (35 y.o. Female) Treating RN: Phillis HaggisPinkerton, Debi Primary Care Physician: Jacquelin HawkingMCELROY, SHANNON Other Clinician: Referring Physician: Jacquelin HawkingMCELROY, SHANNON Treating Physician/Extender: Rudene ReBritto, Grete Bosko Weeks in Treatment: 3 Information Obtained from: Patient Chief Complaint Patients presents for treatment of an open diabetic ulcer the right big toe which she's had for about 6 weeks Electronic Signature(s) Signed: 11/04/2016 3:54:16 PM By: Evlyn KannerBritto, Farhana Fellows MD, FACS Entered By: Evlyn KannerBritto, Isley Zinni on 11/04/2016 15:54:16 Rachel George, Rachel George ([id number]) -------------------------------------------------------------------------------- Debridement Details Patient Name: Rachel George, Rachel George Date of Service: 11/04/2016 3:45 PM Medical Record Number: [id number] Patient Account Number: [id number] Date of Birth/Sex: 06-12-1981 (35 y.o. Female) Treating RN: Ashok CordiaPinkerton, Debi Primary Care Physician: Jacquelin HawkingMCELROY, SHANNON Other Clinician: Referring Physician: Jacquelin HawkingMCELROY, SHANNON Treating Physician/Extender: Rudene ReBritto, Frady Taddeo Weeks in Treatment: 3 Debridement Performed for Wound #1 Right,Dorsal Toe Great Assessment: Performed By: Physician Evlyn KannerBritto, Donnald Tabar, MD Debridement: Debridement Pre-procedure Yes - 15:36 Verification/Time Out Taken: Start Time: 15:37 Pain Control: Lidocaine 4% Topical Solution Level: Skin/Subcutaneous Tissue Total Area Debrided (L x 4 (cm) x 4 (cm) = 16 (cm) W): Tissue and other Viable, Non-Viable, Exudate, Fibrin/Slough, Other, Subcutaneous material debrided: Instrument: Forceps, Scissors Bleeding: Minimum Hemostasis Achieved: Pressure End Time: 15:46 Procedural Pain: 0 Post Procedural Pain: 0 Response to Treatment: Procedure was tolerated well Post  Debridement Measurements of Total Wound Length: (cm) 5.4 Width: (cm) 4.3 Depth: (cm) 0.1 Volume: (cm) 1.824 Character of Wound/Ulcer Post Requires Further Debridement Debridement: Severity of Tissue Post Debridement: Fat layer exposed Post Procedure Diagnosis Same as Pre-procedure Electronic Signature(s) Signed: 11/04/2016 3:54:09 PM By: Evlyn KannerBritto, Isidoro Santillana MD, FACS Signed: 11/04/2016 5:33:36 PM By: Alejandro MullingPinkerton, Debra Entered By: Evlyn KannerBritto, Lulubelle Simcoe on 11/04/2016 15:54:09 Rachel George, Rachel George ([id number]) Rachel George, Rachel George ([id number]) -------------------------------------------------------------------------------- HPI Details Patient Name: Rachel George, Rachel George Date of Service: 11/04/2016 3:45 PM Medical Record Number: [id number] Patient Account Number: [id number] Date of Birth/Sex: 06-12-1981 (35 y.o. Female) Treating RN: Ashok CordiaPinkerton, Debi Primary Care Physician: Jacquelin HawkingMCELROY, SHANNON Other Clinician: Referring Physician: Jacquelin HawkingMCELROY, SHANNON Treating Physician/Extender: Rudene ReBritto, Keasha Malkiewicz Weeks in Treatment: 3 History of Present Illness Location: had a fall and injured her right lower extremity and right big toe Quality: Patient reports experiencing a dull pain to affected area(s). Severity: Patient states wound are getting worse. Duration: Patient has had the wound for < 6 weeks prior to presenting for treatment Timing: Pain in wound is Intermittent (comes and goes Context: The wound occurred when the patient had a fall in early October and injured her right ankle right leg and her toenail Modifying Factors: Other treatment(s) tried include:[facility] and her PCP Associated Signs and Symptoms: Patient reports having foul odor. HPI Description: 35 year old patient who has been a diabetic for a long while was recently seen several times by her PCPs office for a cellulitis and ulcer of her right great toe which has been treated in the ER and in the clinic. that she has had hyperglycemia for a long  while due to noncompliance and she is also morbidly obese with diabetic polyneuropathy. She has been on Augmentin for a while and continues to be a smoker. Xray of the right foot done on 10/08/2016 -- IMPRESSION:Soft tissue swelling and subcutaneous air within the right great toe compatible with ongoing cellulitis.No other acute osseous finding or evidence of definite osteomyelitis by plain radiography. No radiopaque foreign body. Last Hemoglobin A1c done on 09/12/2016 -- was 11%. She  has had treatment for her ankle and leg and has had old fractures there which are being treated conservatively and she will follow-up with her orthopedic specialist at [facility]. 10/21/2016 -- patient has not been here since her first visit and it's about 3 weeks since then. She said she had social economic reasons for not getting here and has been keeping well. Her insulin regimen has been changed and her sugars are running better and she is cutting down on smoking. 11/04/2016 -- the patient again has not been year for 2 weeks and has a lot of maceration and a significant odor smelling very much like a bad fungal infection Electronic Signature(s) Signed: 11/04/2016 3:54:45 PM By: Evlyn Kanner MD, FACS Entered By: Evlyn Kanner on 11/04/2016 15:54:45 Rachel George ([id number]) -------------------------------------------------------------------------------- Physical Exam Details Patient Name: Rachel George Date of Service: 11/04/2016 3:45 PM Medical Record Number: [id number] Patient Account Number: [id number] Date of Birth/Sex: [birth year] (35 y.o. Female) Treating RN: Ashok Cordia, Debi Primary Care Physician: Jacquelin Hawking Other Clinician: Referring Physician: Jacquelin Hawking Treating Physician/Extender: Rudene Re in Treatment: 3 Constitutional . Pulse regular. Respirations normal and unlabored. Afebrile. . Eyes Nonicteric. Reactive to light. Ears, Nose, Mouth, and Throat Lips,  teeth, and gums WNL.Marland Kitchen Moist mucosa without lesions. Neck supple and nontender. No palpable supraclavicular or cervical adenopathy. Normal sized without goiter. Respiratory WNL. No retractions.. Cardiovascular Pedal Pulses WNL. No clubbing, cyanosis or edema. Musculoskeletal Adexa without tenderness or enlargement.. Digits and nails w/o clubbing, cyanosis, infection, petechiae, ischemia, or inflammatory conditions.. Integumentary (Hair, Skin) No suspicious lesions. No crepitus or fluctuance. No peri-wound warmth or erythema. No masses.Marland Kitchen Psychiatric Judgement and insight Intact.. No evidence of depression, anxiety, or agitation.. Notes the patient continues to have significant maceration and fungal infection around her wound which includes the nail, the nail bed and thick cornified epithelium around this toe. Using a forcep and scissors at sharply debrided a lot of this as much as I could do down to the subcutaneous tissue. Electronic Signature(s) Signed: 11/04/2016 3:55:55 PM By: Evlyn Kanner MD, FACS Entered By: Evlyn Kanner on 11/04/2016 15:55:55 Rachel George ([id number]) -------------------------------------------------------------------------------- Physician Orders Details Patient Name: Rachel George Date of Service: 11/04/2016 3:45 PM Medical Record Number: [id number] Patient Account Number: [id number] Date of Birth/Sex: 1980/12/29 (35 y.o. Female) Treating RN: Ashok Cordia, Debi Primary Care Physician: Jacquelin Hawking Other Clinician: Referring Physician: Jacquelin Hawking Treating Physician/Extender: Rudene Re in Treatment: 3 Verbal / Phone Orders: Yes Clinician: Pinkerton, Debi Read Back and Verified: Yes Diagnosis Coding Wound Cleansing Wound #1 Right,Dorsal Toe Great o Clean wound with Normal Saline. o Cleanse wound with mild soap and water o May Shower, gently pat wound dry prior to applying new dressing. Anesthetic Wound #1 Right,Dorsal Toe  Great o Topical Lidocaine 4% cream applied to wound bed prior to debridement Primary Wound Dressing Wound #1 Right,Dorsal Toe Great o Other: - nystatin cream antifungal cream Secondary Dressing Wound #1 Right,Dorsal Toe Great o Gauze and Kerlix/Conform Dressing Change Frequency Wound #1 Right,Dorsal Toe Great o Change dressing every day. Follow-up Appointments Wound #1 Right,Dorsal Toe Great o Return Appointment in 1 week. Off-Loading Wound #1 Right,Dorsal Toe Great o Open toe surgical shoe with peg assist. Additional Orders / Instructions Wound #1 Right,Dorsal Toe Great o Stop Smoking o Increase protein intake. LAMAR, NAEF ([id number]) o Activity as tolerated o Other: - Zinc, MVI, Vit A, C Electronic Signature(s) Signed: 11/04/2016 4:26:33 PM By: Evlyn Kanner MD, FACS Signed: 11/04/2016 5:33:36 PM By: Alejandro Mulling Entered By: Ashok Cordia  Debra on 11/04/2016 15:54:03 YMANI, PORCHER ([id number]) -------------------------------------------------------------------------------- Problem List Details Patient Name: Rachel George, Rachel George Date of Service: 11/04/2016 3:45 PM Medical Record Number: [id number] Patient Account Number: [id number] Date of Birth/Sex: [birth year] (35 y.o. 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 E66.01 Morbid (severe) obesity due to excess calories 10/10/2016 Yes F17.218 Nicotine dependence, cigarettes, with other nicotine- 10/10/2016 Yes induced disorders Inactive Problems Resolved Problems Electronic Signature(s) Signed: 11/04/2016 3:53:37 PM  By: Evlyn Kanner MD, FACS Entered By: Evlyn Kanner on 11/04/2016 15:53:36 Rachel George ([id number]) -------------------------------------------------------------------------------- Progress Note Details Patient Name: Rachel George Date of Service: 11/04/2016 3:45 PM Medical Record Number: [id number] Patient Account Number: [id number] Date of Birth/Sex: Apr 03, 1981 (35 y.o. Female) Treating RN: Phillis Haggis Primary Care Physician: Jacquelin Hawking Other Clinician: Referring Physician: Jacquelin Hawking Treating Physician/Extender: Rudene Re in Treatment: 3 Subjective Chief Complaint Information obtained from Patient Patients presents for treatment of an open diabetic ulcer the right big toe which she's had for about 6 weeks History of Present Illness (HPI) The following HPI elements were documented for the patient's wound: Location: had a fall and injured her right lower extremity and right big toe Quality: Patient reports experiencing a dull pain to affected area(s). Severity: Patient states wound are getting worse. Duration: Patient has had the wound for < 6 weeks prior to presenting for treatment Timing: Pain in wound is Intermittent (comes and goes Context: The wound occurred when the patient had a fall in early October and injured her right ankle right leg and her toenail Modifying Factors: Other treatment(s) tried include:the ortho surgeons at [facility] and her PCP Associated Signs and Symptoms: Patient reports having foul odor. 35 year old patient who has been a diabetic for a long while was recently seen several times by her PCPs office for a cellulitis and ulcer of her right great toe which has been treated in the ER and in the clinic. that she has had hyperglycemia for a long while due to noncompliance and she is also morbidly obese with diabetic polyneuropathy. She has been on Augmentin for a while and continues to be a smoker. Xray of the right foot done  on 10/08/2016 -- IMPRESSION:Soft tissue swelling and subcutaneous air within the right great toe compatible with ongoing cellulitis.No other acute osseous finding or evidence of definite osteomyelitis by plain radiography. No radiopaque foreign body. Last Hemoglobin A1c done on 09/12/2016 -- was 11%. She has had treatment for her ankle and leg and has had old fractures there which are being treated conservatively and she will follow-up with her orthopedic specialist at [facility]. 10/21/2016 -- patient has not been here since her first visit and it's about 3 weeks since then. She said she had social economic reasons for not getting here and has been keeping well. Her insulin regimen has been changed and her sugars are running better and she is cutting down on smoking. 11/04/2016 -- the patient again has not been year for 2 weeks and has a lot of maceration and a significant odor smelling very much like a bad fungal infection Horiuchi, Clydie Braun ([id number]) Objective Constitutional Pulse regular. Respirations normal and unlabored. Afebrile. Vitals Time Taken: 3:20 PM, Height: 67 in, Weight: 343 lbs,  BMI: 53.7, Temperature: 97.9 F, Pulse: 79 bpm, Respiratory Rate: 18 breaths/min, Blood Pressure: 136/85 mmHg. Eyes Nonicteric. Reactive to light. Ears, Nose, Mouth, and Throat Lips, teeth, and gums WNL.Marland Kitchen Moist mucosa without lesions. Neck supple and nontender. No palpable supraclavicular or cervical adenopathy. Normal sized without goiter. Respiratory WNL. No retractions.. Cardiovascular Pedal Pulses WNL. No clubbing, cyanosis or edema. Musculoskeletal Adexa without tenderness or enlargement.. Digits and nails w/o clubbing, cyanosis, infection, petechiae, ischemia, or inflammatory conditions.Marland Kitchen Psychiatric Judgement and insight Intact.. No evidence of depression, anxiety, or agitation.. General Notes: the patient continues to have significant maceration and fungal infection around  her wound which includes the nail, the nail bed and thick cornified epithelium around this toe. Using a forcep and scissors at sharply debrided a lot of this as much as I could do down to the subcutaneous tissue. Integumentary (Hair, Skin) No suspicious lesions. No crepitus or fluctuance. No peri-wound warmth or erythema. No masses.. Wound #1 status is Open. Original cause of wound was Gradually Appeared. The wound is located on the Right,Circumferential Toe Great. The wound measures 2.8cm length x 0.8cm width x 0.1cm depth; 1.759cm^2 area and 0.176cm^3 volume. There is fat exposed. There is no tunneling or undermining noted. There is a large amount of serous drainage noted. The wound margin is thickened. There is small (1-33%) pink granulation within the wound bed. There is a small (1-33%) amount of necrotic tissue within the wound bed including Adherent Slough. The periwound skin appearance exhibited: Callus, Dry/Scaly, Moist. The periwound skin appearance did not exhibit: Crepitus, Excoriation, Fluctuance, Friable, Induration, Localized Edema, Rash, Scarring, Maceration, Atrophie Blanche, Cyanosis, Ecchymosis, Hemosiderin Staining, Karras, Dierdre ([id number]) Mottled, Pallor, Rubor, Erythema. Periwound temperature was noted as No Abnormality. Assessment Active Problems ICD-10 E11.621 - Type 2 diabetes mellitus with foot ulcer L97.512 - Non-pressure chronic ulcer of other part of right foot with fat layer exposed E66.01 - Morbid (severe) obesity due to excess calories F17.218 - Nicotine dependence, cigarettes, with other nicotine-induced disorders For various social and economic reasons the patient has not been taking good care of herself, does not come regularly and has no insurance coverage for medications and investigations. I have recommended: 1. dressing to be applied with antifungal ointment bought over-the-counter, and a bordered foam to be changed daily after washing with soap  and water. 2. Off-Loading this toe as much as possible. 3. again discussed the need to completely give up smoking. 4. following up with her orthopedic specialist at [facility] regarding her ankle swelling and pain. 5. good control of her diabetes mellitus and appropriate nutrition has been discussed with her 6. Regular visits to the wound center Procedures Wound #1 Wound #1 is a Diabetic Wound/Ulcer of the Lower Extremity located on the Right,Dorsal Toe Great . There was a Skin/Subcutaneous Tissue Debridement ([id number]) debridement with total area of 16 sq cm performed by Evlyn Kanner, MD. with the following instrument(s): Forceps and Scissors to remove Viable and Non-Viable tissue/material including Exudate, Fibrin/Slough, Other, and Subcutaneous after achieving pain control using Lidocaine 4% Topical Solution. A time out was conducted at 15:36, prior to the start of the procedure. A Minimum amount of bleeding was controlled with Pressure. The procedure was tolerated well with a pain level of 0 throughout and a pain level of 0 following the procedure. Post Debridement Measurements: 5.4cm length x 4.3cm width x 0.1cm depth; 1.824cm^3 volume. Character of Wound/Ulcer Post Debridement requires further debridement. Severity of Tissue Post Debridement is: Fat layer exposed. Post procedure Diagnosis  Wound #1: Same as Pre-Procedure Rachel George, Kasee ([id number]) Plan Wound Cleansing: Wound #1 Right,Dorsal Toe Great: Clean wound with Normal Saline. Cleanse wound with mild soap and water May Shower, gently pat wound dry prior to applying new dressing. Anesthetic: Wound #1 Right,Dorsal Toe Great: Topical Lidocaine 4% cream applied to wound bed prior to debridement Primary Wound Dressing: Wound #1 Right,Dorsal Toe Great: Other: - nystatin cream antifungal cream Secondary Dressing: Wound #1 Right,Dorsal Toe Great: Gauze and Kerlix/Conform Dressing Change Frequency: Wound #1  Right,Dorsal Toe Great: Change dressing every day. Follow-up Appointments: Wound #1 Right,Dorsal Toe Great: Return Appointment in 1 week. Off-Loading: Wound #1 Right,Dorsal Toe Great: Open toe surgical shoe with peg assist. Additional Orders / Instructions: Wound #1 Right,Dorsal Toe Great: Stop Smoking Increase protein intake. Activity as tolerated Other: - Zinc, MVI, Vit A, C For various social and economic reasons the patient has not been taking good care of herself, does not come regularly and has no insurance coverage for medications and investigations. I have recommended: 1. dressing to be applied with antifungal ointment bought over-the-counter, and a bordered foam to be changed daily after washing with soap and water. 2. Off-Loading this toe as much as possible. 3. again discussed the need to completely give up smoking. Rachel George, Corynne ([id number]) 4. following [facility] regarding her ankle swelling and pain. 5. good control of her diabetes mellitus and appropriate nutrition has been discussed with her 6. Regular visits to the wound center Electronic Signature(s) Signed: 11/04/2016 4:38:56 PM By: Evlyn KannerBritto, Jadrian Bulman MD, FACS Previous Signature: 11/04/2016 3:57:43 PM Version By: Evlyn KannerBritto, Panagiotis Oelkers MD, FACS Entered By: Evlyn KannerBritto, Melody Cirrincione on 11/04/2016 16:38:56 Rachel George, Kobi ([id number]) -------------------------------------------------------------------------------- SuperBill Details Patient Name: Rachel George, Tziporah Date of Service: 11/04/2016 Medical Record Number: [id number] Patient Account Number: [id number] Date of Birth/Sex: 1981-02-28 (35 y.o. Female) Treating RN: Ashok CordiaPinkerton, Debi Primary Care Physician: Jacquelin HawkingMCELROY, SHANNON Other Clinician: Referring Physician: Jacquelin HawkingMCELROY, SHANNON Treating Physician/Extender: Rudene ReBritto, Biruk Troia Weeks in Treatment: 3 Diagnosis Coding ICD-10 Codes Code Description E11.621 Type 2 diabetes mellitus with foot  ulcer 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t with fat layer exposed E66.01 Morbid (severe) obesity due to excess calories F17.218 Nicotine dependence, cigarettes, with other nicotine-induced disorders Facility Procedures CPT4 Code Description: [id number] 11042 - DEB SUBQ TISSUE 20 SQ CM/< ICD-10 Description Diagnosis E11.621 Type 2 diabetes mellitus with foot ulcer L97.512 Non-pressure chronic ulcer of other part of right fo F17.218 Nicotine dependence, cigarettes, with  other nicotine E66.01 Morbid (severe) obesity due to excess calories Modifier: ot with fat la -induced disor Quantity: 1 yer exposed ders Physician Procedures CPT4 Code Description: [id number] 11042 - WC PHYS SUBQ TISS 20 SQ CM ICD-10 Description Diagnosis E11.621 Type 2 diabetes mellitus with foot ulcer L97.512 Non-pressure chronic ulcer of other part of right fo F17.218 Nicotine dependence, cigarettes, with  other nicotine E66.01 Morbid (severe) obesity due to excess calories Modifier: ot with fat lay -induced disord Quantity: 1 er exposed Financial traderers Electronic Signature(s) Signed: 11/04/2016 3:57:57 PM By: Evlyn KannerBritto, Othman Masur MD, FACS Entered By: Evlyn KannerBritto, Randalyn Ahmed on 11/04/2016 15:57:57

## 2016-11-11 ENCOUNTER — Ambulatory Visit: Payer: Self-pay | Admitting: Physician Assistant

## 2016-11-15 ENCOUNTER — Ambulatory Visit: Payer: Self-pay | Admitting: Surgery

## 2016-11-18 ENCOUNTER — Encounter: Payer: Self-pay | Admitting: Physician Assistant

## 2016-11-18 ENCOUNTER — Ambulatory Visit: Payer: Self-pay | Admitting: Physician Assistant

## 2016-11-18 VITALS — BP 130/78 | HR 90 | Temp 97.3°F | Ht 66.0 in | Wt 338.5 lb

## 2016-11-18 DIAGNOSIS — F17218 Nicotine dependence, cigarettes, with other nicotine-induced disorders: Secondary | ICD-10-CM

## 2016-11-18 DIAGNOSIS — E118 Type 2 diabetes mellitus with unspecified complications: Principal | ICD-10-CM

## 2016-11-18 DIAGNOSIS — E1142 Type 2 diabetes mellitus with diabetic polyneuropathy: Secondary | ICD-10-CM

## 2016-11-18 DIAGNOSIS — R609 Edema, unspecified: Secondary | ICD-10-CM

## 2016-11-18 DIAGNOSIS — E1165 Type 2 diabetes mellitus with hyperglycemia: Secondary | ICD-10-CM

## 2016-11-18 DIAGNOSIS — E11621 Type 2 diabetes mellitus with foot ulcer: Secondary | ICD-10-CM

## 2016-11-18 DIAGNOSIS — L97512 Non-pressure chronic ulcer of other part of right foot with fat layer exposed: Secondary | ICD-10-CM

## 2016-11-18 NOTE — Progress Notes (Signed)
BP 130/78 (BP Location: Left Arm, Patient Position: Sitting, Cuff Size: Large)   Pulse 90   Temp 97.3 F (36.3 C) (Other (Comment))   Ht 5\' 6"  (1.676 m)   Wt (!) 338 lb 8 oz (153.5 kg)   LMP 11/02/2016   SpO2 98%   BMI 54.64 kg/m    Subjective:    Patient ID: Rachel George, female    DOB: Dec 02, 1981, 35 y.o.   MRN: [id number]  HPI: Rachel George is a 35 y.o. female presenting on 11/18/2016 for Diabetes   HPI   Pt is still seeing the wound clinic.  She says they gave her antifungal cream.  She returns there this Friday.  She rescheduled from 12/15.  Pt says her toe is a lot better.  She says they are debriding it a lot.   Pt did not bring her bs log.   She says it's been running 120-150.  Highest was 160.  This am was 135.    Relevant past medical, surgical, family and social history reviewed and updated as indicated. Interim medical history since our last visit reviewed. Allergies and medications reviewed and updated.   Current Outpatient Prescriptions:  .  albuterol (PROVENTIL HFA;VENTOLIN HFA) 108 (90 Base) MCG/ACT inhaler, Inhale 2 puffs into the lungs every 6 (six) hours as needed for wheezing or shortness of breath., Disp: , Rfl:  .  bismuth subsalicylate (PEPTO BISMOL) 262 MG/15ML suspension, Take 30 mLs by mouth every 6 (six) hours as needed., Disp: , Rfl:  .  diclofenac (VOLTAREN) 75 MG EC tablet, Take 1 tablet (75 mg total) by mouth 2 (two) times daily as needed., Disp: 30 tablet, Rfl: 0 .  diphenhydrAMINE (BENADRYL) 25 MG tablet, Take 1 tablet (25 mg total) by mouth every 6 (six) hours. (Patient taking differently: Take 25 mg by mouth every 6 (six) hours as needed for allergies. ), Disp: 20 tablet, Rfl: 0 .  diphenoxylate-atropine (LOMOTIL) 2.5-0.025 MG tablet, Take 1 tablet by mouth 4 (four) times daily as needed for diarrhea or loose stools., Disp: 30 tablet, Rfl: 0 .  insulin aspart (NOVOLOG) 100 UNIT/ML injection, Inject 10 Units into the skin 3 (three) times  daily before meals. Reported on 04/16/2016, Disp: , Rfl:  .  insulin detemir (LEVEMIR) 100 UNIT/ML injection, Inject 55 Units into the skin at bedtime. Reported on 04/16/2016, Disp: , Rfl:  .  loperamide (IMODIUM) 2 MG capsule, Take 2 mg by mouth as needed for diarrhea or loose stools., Disp: , Rfl:  .  metFORMIN (GLUCOPHAGE) 500 MG tablet, Take 1 tablet (500 mg total) by mouth 2 (two) times daily with a meal., Disp: 28 tablet, Rfl: 1 .  ondansetron (ZOFRAN ODT) 4 MG disintegrating tablet, Take 1 tablet (4 mg total) by mouth every 8 (eight) hours as needed for nausea., Disp: 6 tablet, Rfl: 0 .  sitaGLIPtin (JANUVIA) 100 MG tablet, Take 1 tablet (100 mg total) by mouth daily., Disp: 90 tablet, Rfl: 2 .  tetrahydrozoline-zinc (VISINE-AC) 0.05-0.25 % ophthalmic solution, Place 2 drops into both eyes 3 (three) times daily as needed., Disp: , Rfl:  .  traMADol (ULTRAM) 50 MG tablet, Take 1 tablet (50 mg total) by mouth every 6 (six) hours as needed., Disp: 15 tablet, Rfl: 0   Review of Systems  Constitutional: Positive for fatigue. Negative for appetite change, chills, diaphoresis, fever and unexpected weight change.  HENT: Negative for congestion, drooling, ear pain, facial swelling, hearing loss, mouth sores, sneezing, sore throat, trouble swallowing and  voice change.   Eyes: Positive for discharge and itching. Negative for pain, redness and visual disturbance.  Respiratory: Negative for cough, choking, shortness of breath and wheezing.   Cardiovascular: Positive for leg swelling. Negative for chest pain and palpitations.  Gastrointestinal: Negative for abdominal pain, blood in stool, constipation, diarrhea and vomiting.  Endocrine: Positive for cold intolerance. Negative for heat intolerance and polydipsia.  Genitourinary: Negative for decreased urine volume, dysuria and hematuria.  Musculoskeletal: Positive for arthralgias and gait problem. Negative for back pain.  Skin: Negative for rash.   Allergic/Immunologic: Positive for environmental allergies.  Neurological: Negative for seizures, syncope, light-headedness and headaches.  Hematological: Negative for adenopathy.  Psychiatric/Behavioral: Positive for agitation. Negative for dysphoric mood and suicidal ideas. The patient is not nervous/anxious.     Per HPI unless specifically indicated above     Objective:    BP 130/78 (BP Location: Left Arm, Patient Position: Sitting, Cuff Size: Large)   Pulse 90   Temp 97.3 F (36.3 C) (Other (Comment))   Ht 5\' 6"  (1.676 m)   Wt (!) 338 lb 8 oz (153.5 kg)   LMP 11/02/2016   SpO2 98%   BMI 54.64 kg/m   Wt Readings from Last 3 Encounters:  11/18/16 (!) 338 lb 8 oz (153.5 kg)  10/30/16 (!) 345 lb 8 oz (156.7 kg)  10/21/16 (!) 335 lb 8 oz (152.2 kg)    Physical Exam  Constitutional: She is oriented to person, place, and time. She appears well-developed and well-nourished.  HENT:  Head: Normocephalic and atraumatic.  Neck: Neck supple.  Cardiovascular: Normal rate and regular rhythm.   Pulmonary/Chest: Effort normal and breath sounds normal.  Abdominal: Soft. Bowel sounds are normal. She exhibits no mass. There is no hepatosplenomegaly. There is no tenderness.  Musculoskeletal: She exhibits no edema.  R great toe abnormal- recently debrided.  Some odor.  No purulent.  Open wound on plantar surface appears to have some granulation tissue.  There is still a lot of thickened skin around the toe.   Lymphadenopathy:    She has no cervical adenopathy.  Neurological: She is alert and oriented to person, place, and time.  Skin: Skin is warm and dry.  Psychiatric: She has a normal mood and affect. Her behavior is normal.  Vitals reviewed.       Assessment & Plan:    Encounter Diagnoses  Name Primary?  Marland Kitchen. Uncontrolled type 2 diabetes mellitus with complication, unspecified long term insulin use status (HCC) Yes  . Diabetic ulcer of toe of right foot associated with type 2  diabetes mellitus, with fat layer exposed (HCC)   . Edema, unspecified type   . Diabetic polyneuropathy associated with type 2 diabetes mellitus (HCC)   . Morbid obesity (HCC)   . Cigarette nicotine dependence with other nicotine-induced disorder      -will check on arterial test that has still not been scheduled for pt -pt to continue with wound care clinic for management of her toe -pt will continue current medications.  Will increase her novolog to add ssi.  (#4).  She will take her 10 units plus the ssi quantity for 10-15 units preprandial.  She is reminded to call office for fbs <70 or >300.   -follow up one month with recheck a1c before appt

## 2016-11-20 ENCOUNTER — Emergency Department (HOSPITAL_COMMUNITY): Payer: Self-pay

## 2016-11-20 ENCOUNTER — Emergency Department (HOSPITAL_COMMUNITY)
Admission: EM | Admit: 2016-11-20 | Discharge: 2016-11-20 | Disposition: A | Discharge status: Home / Self Care | Payer: Self-pay | Attending: Dermatology | Admitting: Dermatology

## 2016-11-20 ENCOUNTER — Encounter (HOSPITAL_COMMUNITY): Payer: Self-pay | Admitting: Emergency Medicine

## 2016-11-20 DIAGNOSIS — R079 Chest pain, unspecified: Secondary | ICD-10-CM | POA: Insufficient documentation

## 2016-11-20 DIAGNOSIS — F17210 Nicotine dependence, cigarettes, uncomplicated: Secondary | ICD-10-CM | POA: Insufficient documentation

## 2016-11-20 DIAGNOSIS — Z794 Long term (current) use of insulin: Secondary | ICD-10-CM | POA: Insufficient documentation

## 2016-11-20 DIAGNOSIS — R200 Anesthesia of skin: Secondary | ICD-10-CM | POA: Insufficient documentation

## 2016-11-20 DIAGNOSIS — Z79899 Other long term (current) drug therapy: Secondary | ICD-10-CM | POA: Insufficient documentation

## 2016-11-20 DIAGNOSIS — E119 Type 2 diabetes mellitus without complications: Secondary | ICD-10-CM | POA: Insufficient documentation

## 2016-11-20 DIAGNOSIS — Z5321 Procedure and treatment not carried out due to patient leaving prior to being seen by health care provider: Secondary | ICD-10-CM | POA: Insufficient documentation

## 2016-11-20 LAB — BASIC METABOLIC PANEL
ANION GAP: 4 — AB (ref 5–15)
BUN: 10 mg/dL (ref 6–20)
CALCIUM: 8.7 mg/dL — AB (ref 8.9–10.3)
CO2: 26 mmol/L (ref 22–32)
Chloride: 106 mmol/L (ref 101–111)
Creatinine, Ser: 0.71 mg/dL (ref 0.44–1.00)
GFR calc Af Amer: >60 mL/min (ref >60)
GFR calc non Af Amer: >60 mL/min (ref >60)
GLUCOSE: 246 mg/dL — AB (ref 65–99)
Potassium: 3.9 mmol/L (ref 3.5–5.1)
Sodium: 136 mmol/L (ref 135–145)

## 2016-11-20 LAB — CBC
HCT: 40.6 % (ref 36.0–46.0)
HEMOGLOBIN: 13.2 g/dL (ref 12.0–15.0)
MCH: 26.5 pg (ref 26.0–34.0)
MCHC: 32.5 g/dL (ref 30.0–36.0)
MCV: 81.4 fL (ref 78.0–100.0)
Platelets: 220 10*3/uL (ref 150–400)
RBC: 4.99 MIL/uL (ref 3.87–5.11)
RDW: 14.4 % (ref 11.5–15.5)
WBC: 7.3 10*3/uL (ref 4.0–10.5)

## 2016-11-20 LAB — TROPONIN I

## 2016-11-20 NOTE — ED Notes (Signed)
Pt called x 3 for room. No answer. 

## 2016-11-20 NOTE — ED Triage Notes (Signed)
Pt reports centralized chest pain that started yesterday. Pt states pain has become worse today and she has some numbness and tingling in her R arm.

## 2016-11-20 NOTE — ED Provider Notes (Signed)
Did not personally evaluate pt; pt left prior to my evaluation   Albesa Seenobyn K Wojeck, NP 11/20/16 1705    Eber HongBrian Miller, MD 11/21/16 2051

## 2016-11-20 NOTE — ED Notes (Signed)
Pt called for room. No answer 

## 2016-11-20 NOTE — ED Notes (Signed)
Delay explained to pt. Pt VSS.

## 2016-11-21 ENCOUNTER — Ambulatory Visit (HOSPITAL_COMMUNITY)
Admission: RE | Admit: 2016-11-21 | Discharge: 2016-11-21 | Disposition: A | Discharge status: Home / Self Care | Payer: Self-pay | Source: Ambulatory Visit | Attending: Physician Assistant | Admitting: Physician Assistant

## 2016-11-21 DIAGNOSIS — R609 Edema, unspecified: Secondary | ICD-10-CM | POA: Insufficient documentation

## 2016-11-21 DIAGNOSIS — I739 Peripheral vascular disease, unspecified: Secondary | ICD-10-CM | POA: Insufficient documentation

## 2016-11-28 ENCOUNTER — Ambulatory Visit: Payer: Self-pay | Admitting: Surgery

## 2016-12-11 ENCOUNTER — Other Ambulatory Visit: Payer: Self-pay | Admitting: Student

## 2016-12-11 ENCOUNTER — Encounter: Payer: Self-pay | Admitting: Physician Assistant

## 2016-12-11 ENCOUNTER — Ambulatory Visit: Payer: Self-pay | Admitting: Physician Assistant

## 2016-12-11 VITALS — BP 118/72 | HR 88 | Temp 97.5°F | Ht 66.0 in | Wt 340.5 lb

## 2016-12-11 DIAGNOSIS — E1142 Type 2 diabetes mellitus with diabetic polyneuropathy: Secondary | ICD-10-CM

## 2016-12-11 DIAGNOSIS — E118 Type 2 diabetes mellitus with unspecified complications: Principal | ICD-10-CM

## 2016-12-11 DIAGNOSIS — E11621 Type 2 diabetes mellitus with foot ulcer: Secondary | ICD-10-CM

## 2016-12-11 DIAGNOSIS — E1165 Type 2 diabetes mellitus with hyperglycemia: Secondary | ICD-10-CM

## 2016-12-11 DIAGNOSIS — I739 Peripheral vascular disease, unspecified: Secondary | ICD-10-CM

## 2016-12-11 DIAGNOSIS — R059 Cough, unspecified: Secondary | ICD-10-CM

## 2016-12-11 DIAGNOSIS — J069 Acute upper respiratory infection, unspecified: Secondary | ICD-10-CM

## 2016-12-11 DIAGNOSIS — L97512 Non-pressure chronic ulcer of other part of right foot with fat layer exposed: Secondary | ICD-10-CM

## 2016-12-11 DIAGNOSIS — F17218 Nicotine dependence, cigarettes, with other nicotine-induced disorders: Secondary | ICD-10-CM

## 2016-12-11 DIAGNOSIS — R05 Cough: Secondary | ICD-10-CM

## 2016-12-11 MED ORDER — DICLOFENAC SODIUM 75 MG PO TBEC: 75.0000 mg | DELAYED_RELEASE_TABLET | Freq: Two times a day (BID) | ORAL | 0 refills | Status: DC | PRN

## 2016-12-11 MED ORDER — BENZONATATE 100 MG PO CAPS: ORAL_CAPSULE | ORAL | 3 refills | Status: DC

## 2016-12-11 MED ORDER — LOVASTATIN 20 MG PO TABS: 20.0000 mg | ORAL_TABLET | Freq: Every day | ORAL | 3 refills | Status: DC

## 2016-12-11 NOTE — Progress Notes (Signed)
BP 118/72 (BP Location: Left Arm, Patient Position: Sitting, Cuff Size: Normal)   Pulse 88   Temp 97.5 F (36.4 C)   Ht 5\' 6"  (1.676 m)   Wt (!) 340 lb 8 oz (154.4 kg)   LMP 11/02/2016   SpO2 99%   BMI 54.96 kg/m    Subjective:    Patient ID: Rachel George, female    DOB: August 26, 1981, 36 y.o.   MRN: [id number]  HPI: Rachel George is a 36 y.o. female presenting on 12/11/2016 for Toe Pain; Chest Pain; and Cough   HPI   Pt missed her appointment with wound care for her toe.  she says because she was sick.  She says that she is still sick  Which is why she hasn't called to reschedule.    Pt says she only got sick last week though so she didn't miss the wound appointment because she was sick.  She says she intended to reschedule it to last week but might have forgot.  Pt also c/o cp x 1 week.  She says it started last week when she got sick.  She says it hurts mostly when she coughs.  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    Relevant past medical, surgical, family and social history reviewed and updated as indicated. Interim medical history since our last visit reviewed. Allergies and medications reviewed and updated.  CURRENT MEDS: Albuterol MDI pepto-bismol Benadryl Lomotil novolog levemir Imodium meetformin januvia Tramadol    Review of Systems  Constitutional: Positive for appetite change, chills, diaphoresis, fatigue and fever. Negative for unexpected weight change.  HENT: Positive for congestion, mouth sores, sneezing and sore throat. Negative for dental problem, drooling, ear pain, facial swelling, hearing loss, trouble swallowing and voice change.   Eyes: Positive for pain, discharge, redness and itching. Negative for visual disturbance.  Respiratory: Positive for cough, shortness of breath and wheezing. Negative for choking.   Cardiovascular: Positive for chest pain and leg  swelling. Negative for palpitations.  Gastrointestinal: Positive for diarrhea and vomiting. Negative for abdominal pain, blood in stool and constipation.  Endocrine: Positive for cold intolerance. Negative for heat intolerance and polydipsia.  Genitourinary: Negative for decreased urine volume, dysuria and hematuria.  Musculoskeletal: Positive for arthralgias and gait problem. Negative for back pain.  Skin: Negative for rash.  Allergic/Immunologic: Positive for environmental allergies.  Neurological: Negative for seizures, syncope, light-headedness and headaches.  Hematological: Negative for adenopathy.  Psychiatric/Behavioral: Positive for agitation. Negative for dysphoric mood and suicidal ideas. The patient is not nervous/anxious.     Per HPI unless specifically indicated above     Objective:    BP 118/72 (BP Location: Left Arm, Patient Position: Sitting, Cuff Size: Normal)   Pulse 88   Temp 97.5 F (36.4 C)   Ht 5\' 6"  (1.676 m)   Wt (!) 340 lb 8 oz (154.4 kg)   LMP 11/02/2016   SpO2 99%   BMI 54.96 kg/m   Wt Readings from Last 3 Encounters:  12/11/16 (!) 340 lb 8 oz (154.4 kg)  11/20/16 (!) 338 lb (153.3 kg)  11/18/16 (!) 338 lb 8 oz (153.5 kg)    Physical Exam  Constitutional: She is oriented to person, place, and time. She appears well-developed and well-nourished.  HENT:  Head: Normocephalic and atraumatic.  Right Ear: Hearing, tympanic membrane, external ear and ear canal normal.  Left Ear: Hearing, tympanic  membrane, external ear and ear canal normal.  Nose: Nose normal.  Mouth/Throat: Uvula is midline and oropharynx is clear and moist. No oropharyngeal exudate.  Neck: Neck supple.  Cardiovascular: Normal rate and regular rhythm.   Pulmonary/Chest: Effort normal and breath sounds normal. She has no wheezes.  Abdominal: Soft. Bowel sounds are normal. She exhibits no mass. There is no hepatosplenomegaly. There is no tenderness.  Musculoskeletal: She exhibits no  edema.       Right foot: There is tenderness. There is no bony tenderness and no swelling.  Right great toe with some odor.  Open wound on plantar surface appears somewhat larger.  There is no redness.  Not significantly changed from last appointment.   Lymphadenopathy:    She has no cervical adenopathy.  Neurological: She is alert and oriented to person, place, and time.  Skin: Skin is warm and dry.  Psychiatric: She has a normal mood and affect. Her behavior is normal.  Vitals reviewed.       Assessment & Plan:    Encounter Diagnoses  Name Primary?  . Acute upper respiratory infection Yes  . Cough   . Cigarette nicotine dependence with other nicotine-induced disorder   . Diabetic ulcer of toe of right foot associated with type 2 diabetes mellitus, with fat layer exposed (HCC)   . Uncontrolled type 2 diabetes mellitus with complication, unspecified long term insulin use status (HCC)   . PVD (peripheral vascular disease) (HCC)     -reviewed results of LE arterial study/ABI -will Start lovastatin (since pt doesn't qualify for medassist can't rx lipitor).  Counseled pt on importance of smoking cessation and getting control of her DM  -pt to Check a1c in advance of her regular OV later this month -Check cxr in light of URI -counseled Smoking cessation -nurse called wound center to reschedule pt's appointment and was told it was their lunch hour and that they would call the pt to reschedule.  Pt is told to notify our office if they don't hear from wound center -rx tessalon for URI -she is to follow up later this month as scheduled

## 2016-12-13 ENCOUNTER — Emergency Department (HOSPITAL_COMMUNITY): Payer: Self-pay

## 2016-12-13 ENCOUNTER — Encounter (HOSPITAL_COMMUNITY): Payer: Self-pay | Admitting: Emergency Medicine

## 2016-12-13 ENCOUNTER — Inpatient Hospital Stay (HOSPITAL_COMMUNITY)
Admission: EM | Admit: 2016-12-13 | Discharge: 2016-12-19 | DRG: 617 | Disposition: A | Discharge status: Home / Self Care | Payer: Self-pay | Attending: Internal Medicine | Admitting: Internal Medicine

## 2016-12-13 DIAGNOSIS — Z833 Family history of diabetes mellitus: Secondary | ICD-10-CM

## 2016-12-13 DIAGNOSIS — E11621 Type 2 diabetes mellitus with foot ulcer: Secondary | ICD-10-CM | POA: Diagnosis present

## 2016-12-13 DIAGNOSIS — L97509 Non-pressure chronic ulcer of other part of unspecified foot with unspecified severity: Secondary | ICD-10-CM

## 2016-12-13 DIAGNOSIS — R609 Edema, unspecified: Secondary | ICD-10-CM

## 2016-12-13 DIAGNOSIS — E119 Type 2 diabetes mellitus without complications: Secondary | ICD-10-CM | POA: Diagnosis present

## 2016-12-13 DIAGNOSIS — F17200 Nicotine dependence, unspecified, uncomplicated: Secondary | ICD-10-CM

## 2016-12-13 DIAGNOSIS — L98496 Non-pressure chronic ulcer of skin of other sites with bone involvement without evidence of necrosis: Secondary | ICD-10-CM | POA: Diagnosis present

## 2016-12-13 DIAGNOSIS — Z885 Allergy status to narcotic agent status: Secondary | ICD-10-CM

## 2016-12-13 DIAGNOSIS — E1165 Type 2 diabetes mellitus with hyperglycemia: Secondary | ICD-10-CM | POA: Diagnosis present

## 2016-12-13 DIAGNOSIS — Z6841 Body Mass Index (BMI) 40.0 and over, adult: Secondary | ICD-10-CM

## 2016-12-13 DIAGNOSIS — E1169 Type 2 diabetes mellitus with other specified complication: Principal | ICD-10-CM | POA: Diagnosis present

## 2016-12-13 DIAGNOSIS — Z23 Encounter for immunization: Secondary | ICD-10-CM

## 2016-12-13 DIAGNOSIS — R2689 Other abnormalities of gait and mobility: Secondary | ICD-10-CM

## 2016-12-13 DIAGNOSIS — M869 Osteomyelitis, unspecified: Secondary | ICD-10-CM

## 2016-12-13 DIAGNOSIS — E1142 Type 2 diabetes mellitus with diabetic polyneuropathy: Secondary | ICD-10-CM | POA: Diagnosis present

## 2016-12-13 DIAGNOSIS — M86271 Subacute osteomyelitis, right ankle and foot: Secondary | ICD-10-CM | POA: Diagnosis present

## 2016-12-13 DIAGNOSIS — Z91013 Allergy to seafood: Secondary | ICD-10-CM

## 2016-12-13 DIAGNOSIS — Z794 Long term (current) use of insulin: Secondary | ICD-10-CM

## 2016-12-13 DIAGNOSIS — E118 Type 2 diabetes mellitus with unspecified complications: Secondary | ICD-10-CM

## 2016-12-13 DIAGNOSIS — F17210 Nicotine dependence, cigarettes, uncomplicated: Secondary | ICD-10-CM | POA: Diagnosis present

## 2016-12-13 HISTORY — DX: Osteomyelitis, unspecified: M86.9

## 2016-12-13 LAB — CBC WITH DIFFERENTIAL/PLATELET
BASOS ABS: 0.1 10*3/uL (ref 0.0–0.1)
BASOS PCT: 1 %
Eosinophils Absolute: 0.2 10*3/uL (ref 0.0–0.7)
Eosinophils Relative: 2 %
HEMATOCRIT: 38.8 % (ref 36.0–46.0)
HEMOGLOBIN: 12.7 g/dL (ref 12.0–15.0)
Lymphocytes Relative: 52 %
Lymphs Abs: 4.9 10*3/uL — ABNORMAL HIGH (ref 0.7–4.0)
MCH: 26 pg (ref 26.0–34.0)
MCHC: 32.7 g/dL (ref 30.0–36.0)
MCV: 79.5 fL (ref 78.0–100.0)
Monocytes Absolute: 0.5 10*3/uL (ref 0.1–1.0)
Monocytes Relative: 5 %
NEUTROS ABS: 3.7 10*3/uL (ref 1.7–7.7)
Neutrophils Relative %: 40 %
Platelets: 269 10*3/uL (ref 150–400)
RBC: 4.88 MIL/uL (ref 3.87–5.11)
RDW: 14.5 % (ref 11.5–15.5)
WBC: 9.3 10*3/uL (ref 4.0–10.5)

## 2016-12-13 LAB — CBG MONITORING, ED: GLUCOSE-CAPILLARY: 265 mg/dL — AB (ref 65–99)

## 2016-12-13 LAB — COMPREHENSIVE METABOLIC PANEL
ALBUMIN: 3.1 g/dL — AB (ref 3.5–5.0)
ALK PHOS: 78 U/L (ref 38–126)
ALT: 11 U/L — ABNORMAL LOW (ref 14–54)
AST: 12 U/L — AB (ref 15–41)
Anion gap: 4 — ABNORMAL LOW (ref 5–15)
BILIRUBIN TOTAL: 0.2 mg/dL — AB (ref 0.3–1.2)
BUN: 10 mg/dL (ref 6–20)
CO2: 28 mmol/L (ref 22–32)
Calcium: 8.8 mg/dL — ABNORMAL LOW (ref 8.9–10.3)
Chloride: 103 mmol/L (ref 101–111)
Creatinine, Ser: 0.74 mg/dL (ref 0.44–1.00)
GFR calc Af Amer: >60 mL/min (ref >60)
GFR calc non Af Amer: >60 mL/min (ref >60)
GLUCOSE: 268 mg/dL — AB (ref 65–99)
POTASSIUM: 4.4 mmol/L (ref 3.5–5.1)
Sodium: 135 mmol/L (ref 135–145)
TOTAL PROTEIN: 7 g/dL (ref 6.5–8.1)

## 2016-12-13 LAB — GLUCOSE, CAPILLARY: GLUCOSE-CAPILLARY: 191 mg/dL — AB (ref 65–99)

## 2016-12-13 MED ORDER — MORPHINE SULFATE (PF) 4 MG/ML IV SOLN
4.0000 mg | Freq: Once | INTRAVENOUS | Status: AC
  Administered 2016-12-13: 4 mg via INTRAVENOUS
  Filled 2016-12-13: qty 1

## 2016-12-13 MED ORDER — PNEUMOCOCCAL VAC POLYVALENT 25 MCG/0.5ML IJ INJ: 0.5000 mL | INJECTION | INTRAMUSCULAR | Status: DC

## 2016-12-13 MED ORDER — INSULIN ASPART 100 UNIT/ML ~~LOC~~ SOLN
0.0000 [IU] | Freq: Three times a day (TID) | SUBCUTANEOUS | Status: DC
  Administered 2016-12-14: 5 [IU] via SUBCUTANEOUS
  Administered 2016-12-14: 3 [IU] via SUBCUTANEOUS
  Administered 2016-12-14 (×2): 5 [IU] via SUBCUTANEOUS
  Administered 2016-12-15 (×2): 3 [IU] via SUBCUTANEOUS
  Administered 2016-12-15: 5 [IU] via SUBCUTANEOUS
  Administered 2016-12-16: 2 [IU] via SUBCUTANEOUS
  Administered 2016-12-17: 8 [IU] via SUBCUTANEOUS
  Administered 2016-12-17: 5 [IU] via SUBCUTANEOUS
  Administered 2016-12-17: 3 [IU] via SUBCUTANEOUS

## 2016-12-13 MED ORDER — INSULIN ASPART 100 UNIT/ML ~~LOC~~ SOLN
0.0000 [IU] | SUBCUTANEOUS | Status: DC
  Administered 2016-12-13: 3 [IU] via SUBCUTANEOUS

## 2016-12-13 MED ORDER — DEXTROSE 5 % IV SOLN
2.0000 g | INTRAVENOUS | Status: DC
  Administered 2016-12-14: 2 g via INTRAVENOUS
  Filled 2016-12-13 (×3): qty 2

## 2016-12-13 MED ORDER — INFLUENZA VAC SPLIT QUAD 0.5 ML IM SUSY
0.5000 mL | PREFILLED_SYRINGE | INTRAMUSCULAR | Status: AC
  Administered 2016-12-14: 0.5 mL via INTRAMUSCULAR

## 2016-12-13 MED ORDER — DEXTROSE-NACL 5-0.9 % IV SOLN
INTRAVENOUS | Status: DC
  Administered 2016-12-13: 75 mL/h via INTRAVENOUS
  Administered 2016-12-14: 10:00:00 via INTRAVENOUS

## 2016-12-13 MED ORDER — ALBUTEROL SULFATE (2.5 MG/3ML) 0.083% IN NEBU: 3.0000 mL | INHALATION_SOLUTION | Freq: Four times a day (QID) | RESPIRATORY_TRACT | Status: DC | PRN

## 2016-12-13 MED ORDER — HYDROMORPHONE HCL 1 MG/ML IJ SOLN
0.5000 mg | INTRAMUSCULAR | Status: DC | PRN
  Administered 2016-12-13 – 2016-12-18 (×19): 0.5 mg via INTRAVENOUS
  Filled 2016-12-13 (×18): qty 1

## 2016-12-13 MED ORDER — HEPARIN SODIUM (PORCINE) 5000 UNIT/ML IJ SOLN
5000.0000 [IU] | Freq: Three times a day (TID) | INTRAMUSCULAR | Status: DC
  Administered 2016-12-13 – 2016-12-19 (×12): 5000 [IU] via SUBCUTANEOUS
  Filled 2016-12-13 (×12): qty 1

## 2016-12-13 MED ORDER — LEVOFLOXACIN IN D5W 750 MG/150ML IV SOLN
750.0000 mg | Freq: Once | INTRAVENOUS | Status: AC
  Administered 2016-12-13: 750 mg via INTRAVENOUS
  Filled 2016-12-13: qty 150

## 2016-12-13 MED ORDER — INSULIN DETEMIR 100 UNIT/ML ~~LOC~~ SOLN
20.0000 [IU] | Freq: Every day | SUBCUTANEOUS | Status: DC
  Administered 2016-12-13 – 2016-12-16 (×4): 20 [IU] via SUBCUTANEOUS
  Filled 2016-12-13 (×5): qty 0.2

## 2016-12-13 MED ORDER — ONDANSETRON HCL 4 MG/2ML IJ SOLN
4.0000 mg | Freq: Once | INTRAMUSCULAR | Status: AC
  Administered 2016-12-13: 4 mg via INTRAVENOUS
  Filled 2016-12-13: qty 2

## 2016-12-13 MED ORDER — ONDANSETRON HCL 4 MG/2ML IJ SOLN
4.0000 mg | Freq: Three times a day (TID) | INTRAMUSCULAR | Status: DC | PRN
  Administered 2016-12-13 – 2016-12-18 (×11): 4 mg via INTRAVENOUS
  Filled 2016-12-13 (×11): qty 2

## 2016-12-13 MED ORDER — PRAVASTATIN SODIUM 40 MG PO TABS
40.0000 mg | ORAL_TABLET | Freq: Every day | ORAL | Status: DC
  Administered 2016-12-13 – 2016-12-18 (×6): 40 mg via ORAL
  Filled 2016-12-13 (×6): qty 1

## 2016-12-13 NOTE — ED Notes (Signed)
Family in to visit  Ice chips to pt

## 2016-12-13 NOTE — ED Notes (Signed)
Pt reports that [facility] [place] whom she last saw in December.  She reports that she was supposed to be called with an appointment today for the wound center but they did not call. Her toes is debrided at the wound center and dressed but states that she has not been referred to a surgeon

## 2016-12-13 NOTE — ED Notes (Signed)
HB in to assess 

## 2016-12-13 NOTE — ED Notes (Signed)
Dr  Le in to assess 

## 2016-12-13 NOTE — H&P (Signed)
History and Physical    Rachel George WJX:[id number] DOB: 1981/11/04 DOA: 12/13/2016  PCP: Jacquelin Hawking, PA-C  Patient coming from: Home.    Chief Complaint:  Increase pain in her right foot.   HPI: Rachel George is an 36 y.o. female with hx of morbid obesity, DM, chronic right great toe ulcer for the past 3 months, chronic right lower leg swelling for 3 months, cared for at the wound care, presented to the ER with increase pain of her right foot. She has had no fever, chills, or any discharge from her wound.  Evaluation in the ER showed normal WBC, no fever, and Cr of 0.74.  Xray of the foot showed evidence of osteomyelitis, and she was started on IV Levoquin, and hospitalist was asked to admit her for further Tx.     ED Course:  See above.  Rewiew of Systems:  Constitutional: Negative for malaise, fever and chills. No significant weight loss or weight gain Eyes: Negative for eye pain, redness and discharge, diplopia, visual changes, or flashes of light. ENMT: Negative for ear pain, hoarseness, nasal congestion, sinus pressure and sore throat. No headaches; tinnitus, drooling, or problem swallowing. Cardiovascular: Negative for chest pain, palpitations, diaphoresis, dyspnea and peripheral edema. ; No orthopnea, PND Respiratory: Negative for cough, hemoptysis, wheezing and stridor. No pleuritic chestpain. Gastrointestinal: Negative for diarrhea, constipation,  melena, blood in stool, hematemesis, jaundice and rectal bleeding.    Genitourinary: Negative for frequency, dysuria, incontinence,flank pain and hematuria; Musculoskeletal: Negative for back pain and neck pain. Negative for swelling and trauma.;  Skin: . Negative for pruritus, rash, abrasions, bruising and skin lesion.; ulcerations Neuro: Negative for headache, lightheadedness and neck stiffness. Negative for weakness, altered level of consciousness , altered mental status, extremity weakness, burning feet, involuntary movement,  seizure and syncope.  Psych: negative for anxiety, depression, insomnia, tearfulness, panic attacks, hallucinations, paranoia, suicidal or homicidal ideation  Past Medical History:  Diagnosis Date  . Diabetes mellitus without complication (HCC)    dx age 61  . Fatty liver disease, nonalcoholic   . Macular degeneration     History reviewed. No pertinent surgical history.   reports that she has been smoking Cigarettes.  She has been smoking about 0.50 packs per day. She has never used smokeless tobacco. She reports that she does not drink alcohol or use drugs.  Allergies  Allergen Reactions  . Shellfish Allergy Anaphylaxis, Shortness Of Breath and Swelling    Facial Swelling  . Percocet [Oxycodone-Acetaminophen] Itching and Nausea And Vomiting    Family History  Problem Relation Age of Onset  . Diabetes Father      Prior to Admission medications   Medication Sig Start Date End Date Taking? Authorizing Provider  albuterol (PROVENTIL HFA;VENTOLIN HFA) 108 (90 Base) MCG/ACT inhaler Inhale 2 puffs into the lungs every 6 (six) hours as needed for wheezing or shortness of breath.    Historical Provider, MD  benzonatate (TESSALON PERLES) 100 MG capsule 1- 2 capsules po q 8 hour prn cough Patient taking differently: Take 100-200 mg by mouth every 8 (eight) hours as needed for cough.  12/11/16   Jacquelin Hawking, PA-C  bismuth subsalicylate (PEPTO BISMOL) 262 MG/15ML suspension Take 30 mLs by mouth every 6 (six) hours as needed.    Historical Provider, MD  diclofenac (VOLTAREN) 75 MG EC tablet Take 1 tablet (75 mg total) by mouth 2 (two) times daily as needed. 12/11/16   Jacquelin Hawking, PA-C  diphenhydrAMINE (BENADRYL) 25 MG tablet Take 1 tablet (  25 mg total) by mouth every 6 (six) hours. Patient taking differently: Take 25 mg by mouth every 6 (six) hours as needed for allergies.  05/16/16   Ivery Quale, PA-C  diphenoxylate-atropine (LOMOTIL) 2.5-0.025 MG tablet Take 1 tablet by mouth 4  (four) times daily as needed for diarrhea or loose stools. 08/06/16   Rolland Porter, MD  insulin aspart (NOVOLOG) 100 UNIT/ML injection Inject 10 Units into the skin 3 (three) times daily before meals. 10 units plus sliding scales.    Historical Provider, MD  insulin detemir (LEVEMIR) 100 UNIT/ML injection Inject 55 Units into the skin at bedtime. Reported on 04/16/2016    Historical Provider, MD  loperamide (IMODIUM) 2 MG capsule Take 2 mg by mouth as needed for diarrhea or loose stools.    Historical Provider, MD  lovastatin (MEVACOR) 20 MG tablet Take 1 tablet (20 mg total) by mouth at bedtime. 12/11/16   Jacquelin Hawking, PA-C  metFORMIN (GLUCOPHAGE) 500 MG tablet Take 1 tablet (500 mg total) by mouth 2 (two) times daily with a meal. 09/08/15   Vanetta Mulders, MD  ondansetron (ZOFRAN ODT) 4 MG disintegrating tablet Take 1 tablet (4 mg total) by mouth every 8 (eight) hours as needed for nausea. 08/06/16   Rolland Porter, MD  sitaGLIPtin (JANUVIA) 100 MG tablet Take 1 tablet (100 mg total) by mouth daily. 10/07/16   Jacquelin Hawking, PA-C  tetrahydrozoline-zinc (VISINE-AC) 0.05-0.25 % ophthalmic solution Place 2 drops into both eyes 3 (three) times daily as needed.    Historical Provider, MD  traMADol (ULTRAM) 50 MG tablet Take 1 tablet (50 mg total) by mouth every 6 (six) hours as needed. 09/04/16   Burgess Amor, PA-C    Physical Exam: Vitals:   12/13/16 1449 12/13/16 1643 12/13/16 1755  BP: 140/76 144/78 137/75  Pulse: 86 84 77  Resp: 20 18 18   Temp: 97.9 F (36.6 C) 97.9 F (36.6 C)   TempSrc: Oral Oral   SpO2: 100% 100% 100%  Weight: (!) 154.2 kg (340 lb)        Constitutional: NAD, calm, comfortable Vitals:   12/13/16 1449 12/13/16 1643 12/13/16 1755  BP: 140/76 144/78 137/75  Pulse: 86 84 77  Resp: 20 18 18   Temp: 97.9 F (36.6 C) 97.9 F (36.6 C)   TempSrc: Oral Oral   SpO2: 100% 100% 100%  Weight: (!) 154.2 kg (340 lb)     Eyes: PERRL, lids and conjunctivae normal ENMT: Mucous  membranes are moist. Posterior pharynx clear of any exudate or lesions.Normal dentition.  Neck: normal, supple, no masses, no thyromegaly Respiratory: clear to auscultation bilaterally, no wheezing, no crackles. Normal respiratory effort. No accessory muscle use.  Cardiovascular: Regular rate and rhythm, no murmurs / rubs / gallops. No extremity edema. 2+ pedal pulses. No carotid bruits.  Abdomen: no tenderness, no masses palpated. No hepatosplenomegaly. Bowel sounds positive.  Musculoskeletal: no clubbing / cyanosis. No joint deformity upper and lower extremities. Good ROM, no contractures. Normal muscle tone.  Skin: no rashes, lesions, ulcer of the right great toe.  Neurologic: CN 2-12 grossly intact. Sensation intact, DTR normal. Strength 5/5 in all 4.  Psychiatric: Normal judgment and insight. Alert and oriented x 3. Normal mood.     Labs on Admission: I have personally reviewed following labs and imaging studies  CBC:  Recent Labs Lab 12/13/16 1524  WBC 9.3  NEUTROABS 3.7  HGB 12.7  HCT 38.8  MCV 79.5  PLT 269   Basic Metabolic Panel:  Recent  Labs Lab 12/13/16 1524  NA 135  K 4.4  CL 103  CO2 28  GLUCOSE 268*  BUN 10  CREATININE 0.74  CALCIUM 8.8*   GFR: Estimated Creatinine Clearance: 149.3 mL/min (by C-G formula based on SCr of 0.74 mg/dL). Liver Function Tests:  Recent Labs Lab 12/13/16 1524  AST 12*  ALT 11*  ALKPHOS 78  BILITOT 0.2*  PROT 7.0  ALBUMIN 3.1*   CBG:  Recent Labs Lab 12/13/16 1453  GLUCAP 265*   Lipid Profile: Urine analysis:    Component Value Date/Time   COLORURINE YELLOW 08/06/2016 1417   APPEARANCEUR CLEAR 08/06/2016 1417   LABSPEC 1.020 08/06/2016 1417   PHURINE 6.0 08/06/2016 1417   GLUCOSEU NEGATIVE 08/06/2016 1417   HGBUR NEGATIVE 08/06/2016 1417   BILIRUBINUR NEGATIVE 08/06/2016 1417   KETONESUR NEGATIVE 08/06/2016 1417   PROTEINUR TRACE (A) 08/06/2016 1417   UROBILINOGEN 0.2 09/08/2015 1740   NITRITE POSITIVE  (A) 08/06/2016 1417   LEUKOCYTESUR NEGATIVE 08/06/2016 1417    Recent Results (from the past 240 hour(s))  Culture, blood (Routine X 2) w Reflex to ID Panel     Status: None (Preliminary result)   Collection Time: 12/13/16  3:26 PM  Result Value Ref Range Status   Specimen Description RIGHT ANTECUBITAL  Final   Special Requests BOTTLES DRAWN AEROBIC AND ANAEROBIC [facility] EACH  Final   Culture PENDING  Incomplete   Report Status PENDING  Incomplete  Culture, blood (Routine X 2) w Reflex to ID Panel     Status: None (Preliminary result)   Collection Time: 12/13/16  3:37 PM  Result Value Ref Range Status   Specimen Description LEFT ANTECUBITAL  Final   Special Requests BOTTLES DRAWN AEROBIC AND ANAEROBIC [facility] EACH  Final   Culture PENDING  Incomplete   Report Status PENDING  Incomplete     Radiological Exams on Admission: Dg Foot Complete Right  Result Date: 12/13/2016 CLINICAL DATA:  Diabetic patient with a chronic wound on the right great toe. EXAM: RIGHT FOOT COMPLETE - 3+ VIEW COMPARISON:  Plain films the right foot 09/04/2016. FINDINGS: Large soft tissue wound is seen at the distal aspect of the great toe and soft tissues are markedly swollen about the toe and throughout the foot. There is bony destructive change of the tuft of the great toe consistent with osteomyelitis. No fracture or dislocation. No soft tissue gas. IMPRESSION: Findings consistent with osteomyelitis in the distal phalanx of the great toe. Large soft tissue wound in the distal phalanx of the great toe and marked soft tissue swelling about the foot diffusely. Electronically Signed   By: Drusilla Kanner M.D.   On: 12/13/2016 15:19    EKG: Independently reviewed.  Assessment/Plan Principal Problem:   Osteomyelitis (HCC) Active Problems:   Uncontrolled type 2 diabetes mellitus with complication (HCC)   Morbid obesity (HCC)   Diabetic foot ulcer (HCC)   Nicotine dependence   PLAN:     Osteomyelitis:   Will continue  with IV antibiotic.   Will give Rocephin 2 g per day.  I will consult surgery for bone biopsy vs amputation.  I have considered no antibiotic to increase culture yield, but she already was placed on IV antibiotic in the ER, so will continue.    Right leg swelling:  This is chronic for the past 3 months.  Will obtain US of legs to exclude DVT.  DM:  Will reduce Lantus, and use moderate SSI for her BS.  DVT prophylaxis: Heparin SQ.  Code Status: FULL CODE. Family Communication: Husband at bedside.  Disposition Plan: To home when appropriate.  Consults called: None.  Admission status: OBS.    Gabriele Loveland MD FACP. [facility]  If 7PM-7AM, please contact night-coverage [email or web address] Password [email or web address]  12/13/2016, 7:35 PM

## 2016-12-13 NOTE — ED Provider Notes (Signed)
[facility]-EMER[facility]ENCY DEPT Provider Note   CSN: [id number] Arrival date & time: 12/13/16  1329     History   Chief Complaint Chief Complaint  Patient presents with  . Foot Pain    HPI Rachel George is a 36 y.o. female.  Female who presents to the emergency department with complaint of toe pain.  Patient states that she has had a wound to the big toe of her right foot for some time now. She has been seen by the wound care clinic in December for this. She's not been able to follow-up because she was sick with a virus. She is scheduled to see the clinic again either next week. She began however in the last for 5 days to have swelling of the toe, swelling of the foot, and swelling of the leg. She has not had fever or chills to be reported. But she has noted some foul-smelling drainage from the big toe. She presents to the emergency department because she states the pain seems to be going all the way upper leg and she is concerned about advancing infection.      Past Medical History:  Diagnosis Date  . Diabetes mellitus without complication (HCC)    dx age 53  . Fatty liver disease, nonalcoholic   . Macular degeneration     Patient Active Problem List   Diagnosis Date Noted  . Diabetic foot ulcer (HCC) 10/15/2016  . Nicotine dependence 10/15/2016  . Cellulitis of toe of right foot 10/07/2016  . Uncontrolled type 2 diabetes mellitus with complication (HCC) 09/09/2016  . Morbid obesity (HCC) 09/09/2016  . Diabetic polyneuropathy associated with type 2 diabetes mellitus (HCC) 09/09/2016    History reviewed. No pertinent surgical history.  OB History    Gravida Para Term Preterm AB Living             0   SAB TAB Ectopic Multiple Live Births                   Home Medications    Prior to Admission medications   Medication Sig Start Date End Date Taking? Authorizing Provider  albuterol (PROVENTIL HFA;VENTOLIN HFA) 108 (90 Base) MCG/ACT inhaler Inhale 2 puffs into the lungs  every 6 (six) hours as needed for wheezing or shortness of breath.    Historical Provider, MD  benzonatate (TESSALON PERLES) 100 MG capsule 1- 2 capsules po q 8 hour prn cough 12/11/16   Jacquelin Hawking, PA-C  bismuth subsalicylate (PEPTO BISMOL) 262 MG/15ML suspension Take 30 mLs by mouth every 6 (six) hours as needed.    Historical Provider, MD  diclofenac (VOLTAREN) 75 MG EC tablet Take 1 tablet (75 mg total) by mouth 2 (two) times daily as needed. 12/11/16   Jacquelin Hawking, PA-C  diphenhydrAMINE (BENADRYL) 25 MG tablet Take 1 tablet (25 mg total) by mouth every 6 (six) hours. Patient taking differently: Take 25 mg by mouth every 6 (six) hours as needed for allergies.  05/16/16   Ivery Quale, PA-C  diphenoxylate-atropine (LOMOTIL) 2.5-0.025 MG tablet Take 1 tablet by mouth 4 (four) times daily as needed for diarrhea or loose stools. 08/06/16   Rolland [place], MD  insulin aspart (NOVOLOG) 100 UNIT/ML injection Inject 10 Units into the skin 3 (three) times daily before meals. 10 units plus sliding scales.    Historical Provider, MD  insulin detemir (LEVEMIR) 100 UNIT/ML injection Inject 55 Units into the skin at bedtime. Reported on 04/16/2016    Historical Provider, MD  loperamide (  IMODIUM) 2 MG capsule Take 2 mg by mouth as needed for diarrhea or loose stools.    Historical Provider, MD  lovastatin (MEVACOR) 20 MG tablet Take 1 tablet (20 mg total) by mouth at bedtime. 12/11/16   Jacquelin Hawking, PA-C  metFORMIN (GLUCOPHAGE) 500 MG tablet Take 1 tablet (500 mg total) by mouth 2 (two) times daily with a meal. 09/08/15   Vanetta Mulders, MD  ondansetron (ZOFRAN ODT) 4 MG disintegrating tablet Take 1 tablet (4 mg total) by mouth every 8 (eight) hours as needed for nausea. 08/06/16   Rolland [place], MD  sitaGLIPtin (JANUVIA) 100 MG tablet Take 1 tablet (100 mg total) by mouth daily. 10/07/16   Jacquelin Hawking, PA-C  tetrahydrozoline-zinc (VISINE-AC) 0.05-0.25 % ophthalmic solution Place 2 drops into both eyes 3 (three)  times daily as needed.    Historical Provider, MD  traMADol (ULTRAM) 50 MG tablet Take 1 tablet (50 mg total) by mouth every 6 (six) hours as needed. 09/04/16   Burgess Amor, PA-C    Family History Family History  Problem Relation Age of Onset  . Diabetes Father     Social History Social History  Substance Use Topics  . Smoking status: Current Every Day Smoker    Packs/day: 0.50    Types: Cigarettes  . Smokeless tobacco: Never Used     Comment: has cut down to 4-5 qd  . Alcohol use No     Allergies   Shellfish allergy and Percocet [oxycodone-acetaminophen]   Review of Systems Review of Systems  Constitutional: Negative for activity change.       All ROS Neg except as noted in HPI  HENT: Negative for nosebleeds.   Eyes: Negative for photophobia and discharge.  Respiratory: Negative for cough, shortness of breath and wheezing.   Cardiovascular: Negative for chest pain and palpitations.  Gastrointestinal: Negative for abdominal pain and blood in stool.  Genitourinary: Negative for dysuria, frequency and hematuria.  Musculoskeletal: Negative for arthralgias, back pain and neck pain.  Skin: Positive for wound.  Neurological: Negative for dizziness, seizures and speech difficulty.  Psychiatric/Behavioral: Negative for confusion and hallucinations.     Physical Exam Updated Vital Signs BP 140/76 (BP Location: Left Arm)   Pulse 86   Temp 97.9 F (36.6 C) (Oral)   Resp 20   Wt (!) 154.2 kg   LMP 11/28/2016   SpO2 100%   BMI 54.88 kg/m   Physical Exam  Constitutional: She is oriented to person, place, and time. She appears well-developed and well-nourished.  Non-toxic appearance.  HENT:  Head: Normocephalic.  Right Ear: Tympanic membrane and external ear normal.  Left Ear: Tympanic membrane and external ear normal.  Eyes: EOM and lids are normal. Pupils are equal, round, and reactive to light.  Neck: Normal range of motion. Neck supple. Carotid bruit is not present.    Cardiovascular: Normal rate, regular rhythm, normal heart sounds, intact distal pulses and normal pulses.   Pulmonary/Chest: Breath sounds normal. No respiratory distress.  Abdominal: Soft. Bowel sounds are normal. There is no tenderness. There is no guarding.  Musculoskeletal: Normal range of motion.  There is pitting edema from just above the knee to the foot. The right foot and ankle are warm to touch. There is a denuded area of the distal right first toe, with friability at the plantar surface. There is foul-smelling drainage from the plantar surface of the right first toe. There is discoloration of the right first toe. The dorsalis pedis pulses 1+. The  dorsalis pedis pulse on the left is 2+.  Lymphadenopathy:       Head (right side): No submandibular adenopathy present.       Head (left side): No submandibular adenopathy present.    She has no cervical adenopathy.  Neurological: She is alert and oriented to person, place, and time. She has normal strength. No cranial nerve deficit or sensory deficit.  Skin: Skin is warm and dry.  Psychiatric: She has a normal mood and affect. Her speech is normal.  Nursing note and vitals reviewed.    ED Treatments / Results  Labs (all labs ordered are listed, but only abnormal results are displayed) Labs Reviewed  CBG MONITORING, ED - Abnormal; Notable for the following:       Result Value   Glucose-Capillary 265 (*)    All other components within normal limits  CBC WITH DIFFERENTIAL/PLATELET  COMPREHENSIVE METABOLIC PANEL    EKG  EKG Interpretation None       Radiology Dg Foot Complete Right  Result Date: 12/13/2016 CLINICAL DATA:  Diabetic patient with a chronic wound on the right great toe. EXAM: RIGHT FOOT COMPLETE - 3+ VIEW COMPARISON:  Plain films the right foot 09/04/2016. FINDINGS: Large soft tissue wound is seen at the distal aspect of the great toe and soft tissues are markedly swollen about the toe and throughout the foot.  There is bony destructive change of the tuft of the great toe consistent with osteomyelitis. No fracture or dislocation. No soft tissue gas. IMPRESSION: Findings consistent with osteomyelitis in the distal phalanx of the great toe. Large soft tissue wound in the distal phalanx of the great toe and marked soft tissue swelling about the foot diffusely. Electronically Signed   By: Drusilla Kanner M.D.   On: 12/13/2016 15:19    Procedures Procedures (including critical care time)  Medications Ordered in ED Medications  ondansetron (ZOFRAN) injection 4 mg (not administered)  morphine 4 MG/ML injection 4 mg (not administered)     Initial Impression / Assessment and Plan / ED Course  I have reviewed the triage vital signs and the nursing notes.  Pertinent labs & imaging results that were available during my care of the patient were reviewed by me and considered in my medical decision making (see chart for details).  Clinical Course     *I have reviewed nursing notes, vital signs, and all appropriate lab and imaging results for this patient.**  Final Clinical Impressions(s) / ED Diagnoses  MDM The vital signs within normal limits. The pulse oximetry is 100% on room air. The patient has a clinical picture that is disturbing because there is a denuded area of the distal first toe on the right. There is an old ulcer at the plantar surface with possibility drainage and there is swelling involving nearly the entire right lower extremity. The patient has a history of diabetes mellitus.  X-ray of the right foot shows findings consistent with osteomyelitis of the distal phalanx of the right toe. There is also noted a large soft tissue wound at the right great toe. The complete blood count is well within normal limits. No elevation of white blood cells. The competence of metabolic panel shows the glucose to be elevated at 268, the renal function seems to be within normal limits. The proteins are low at  3.1, the total bilirubin is low at 0.2. The anion gap is low at 4. I discussed these findings with the patient in terms which he understands. I discussed  with her my concern, and I discussed with her the need for hospitalization. She is in agreement with this plan, but would like to discuss it further with her husband.   Patient seen with me in case discussed with Dr. Hyacinth MeekerMiller.   Call placed to the hospitalist for admission. Patient's [facility], NP.   Final diagnoses:  Osteomyelitis of toe of right foot [facility](HCC)    New Prescriptions New Prescriptions   No medications on file     Ivery QualeHobson Jaeliana Lococo, PA-C 12/14/16 1349    Eber HongBrian Miller, MD 12/15/16 [phone]

## 2016-12-13 NOTE — ED Provider Notes (Signed)
The patient is a 36 year old female, she is morbidly obese, she has a history of diabetes, she has a known ulcer of her right great toe which has been treated at the wound care center. She reports that she missed her last treatment because of a coexistent flu infection and subsequently has developed increased swelling and pain of the right greater oh, the right foot as well as the right lower extremity below the knee. On exam the patient is well-appearing without any acute distress, she has objective swelling with 2+ pitting edema of the right lower extremity below the knee. This extends down through the foot though there are palpable pulses at dorsalis pedis. The right great toe has a significant ulceration, there is foul-smelling drainage coming from the toe.  X-rays reviewed, there does appear to be distraction of the distal phalanx of the great toe. This is consistent with osteomyelitis. Safely she does not have a leukocytosis or any renal dysfunction based on her lab work. She will need IV antibiotics, orthopedic consultation and admission to the hospital under the hospitalist service.  Medical screening examination/treatment/procedure(s) were conducted as a shared visit with non-physician practitioner(s) and myself.  I personally evaluated the patient during the encounter.  Clinical Impression:   Final diagnoses:  Osteomyelitis of toe of right foot (HCC)         Eber HongBrian Tashaun Obey, MD 12/15/16 1625

## 2016-12-13 NOTE — ED Notes (Signed)
Pt reports that she has no sensation to her toe, but that the top of her foot "is always a 10".

## 2016-12-13 NOTE — ED Triage Notes (Signed)
Patient complains of right great toe pain. Patient states that she is being treated at a wound clinic. States pain how gotten worse since she has had stomach virus last week. Patient Diabetic states home CGB this morning of 193.

## 2016-12-13 NOTE — ED Notes (Signed)
Report to Lisa, RN

## 2016-12-14 DIAGNOSIS — M869 Osteomyelitis, unspecified: Secondary | ICD-10-CM

## 2016-12-14 DIAGNOSIS — E1165 Type 2 diabetes mellitus with hyperglycemia: Secondary | ICD-10-CM

## 2016-12-14 DIAGNOSIS — E118 Type 2 diabetes mellitus with unspecified complications: Secondary | ICD-10-CM

## 2016-12-14 LAB — GLUCOSE, CAPILLARY
GLUCOSE-CAPILLARY: 176 mg/dL — AB (ref 65–99)
Glucose-Capillary: 191 mg/dL — ABNORMAL HIGH (ref 65–99)
Glucose-Capillary: 193 mg/dL — ABNORMAL HIGH (ref 65–99)
Glucose-Capillary: 201 mg/dL — ABNORMAL HIGH (ref 65–99)
Glucose-Capillary: 204 mg/dL — ABNORMAL HIGH (ref 65–99)
Glucose-Capillary: 250 mg/dL — ABNORMAL HIGH (ref 65–99)

## 2016-12-14 MED ORDER — VANCOMYCIN HCL IN DEXTROSE 1-5 GM/200ML-% IV SOLN
1000.0000 mg | Freq: Once | INTRAVENOUS | Status: AC
  Administered 2016-12-14: 1000 mg via INTRAVENOUS
  Filled 2016-12-14: qty 200

## 2016-12-14 MED ORDER — DEXTROSE 5 % IV SOLN
2.0000 g | INTRAVENOUS | Status: DC
  Administered 2016-12-14 – 2016-12-18 (×5): 2 g via INTRAVENOUS
  Filled 2016-12-14 (×6): qty 2

## 2016-12-14 MED ORDER — DEXTROSE 5 % IV SOLN
INTRAVENOUS | Status: AC
  Filled 2016-12-14: qty 2

## 2016-12-14 MED ORDER — VANCOMYCIN HCL 10 G IV SOLR
1500.0000 mg | Freq: Three times a day (TID) | INTRAVENOUS | Status: DC
  Administered 2016-12-15 – 2016-12-17 (×7): 1500 mg via INTRAVENOUS
  Filled 2016-12-14 (×8): qty 1500

## 2016-12-14 NOTE — Consult Note (Addendum)
SURGICAL CONSULTATION NOTE (initial) - cpt: [phone]  HISTORY OF PRESENT ILLNESS (HPI):  36 y.o. morbidly obese female presented yesterday to [facility] ED with Right 1st toe pain and swelling x 3-5 days associated with a traumatic Right first toe wound she incurred when she slipped and fell onto her Right leg in the shower 3.5 months ago (10/1). She reports that she's been seen by Dr. Meyer RusselBritto in [place] for wound care including debridement since that time, but the wound has overall not improved despite periods of some improvement followed by worsening. She also states that she was prescribed and took amoxicillin between October and December, since which time she has not taken any antibiotics until her current admission yesterday, and only recently has she begun to keep her blood glucose "sometimes below 200". Otherwise, she says her pain is controlled, and she denies any fever/chills since admission, CP, or SOB.  Surgery is consulted by medical physicians Dr. Conley RollsLe and Dr. Kerry HoughMemon in this context for evaluation and management of Right 1st toe osteomyelitis.  PAST MEDICAL HISTORY (PMH):  Past Medical History:  Diagnosis Date  . Diabetes mellitus without complication (HCC)    dx age 36  . Fatty liver disease, nonalcoholic   . Macular degeneration      PAST SURGICAL HISTORY (PSH):  History reviewed. No pertinent surgical history.   MEDICATIONS:  Prior to Admission medications   Medication Sig Start Date End Date Taking? Authorizing Provider  albuterol (PROVENTIL HFA;VENTOLIN HFA) 108 (90 Base) MCG/ACT inhaler Inhale 2 puffs into the lungs every 6 (six) hours as needed for wheezing or shortness of breath.   Yes Historical Provider, MD  benzonatate (TESSALON PERLES) 100 MG capsule 1- 2 capsules po q 8 hour prn cough Patient taking differently: Take 100-200 mg by mouth every 8 (eight) hours as needed for cough.  12/11/16  Yes Jacquelin HawkingShannon McElroy, PA-C  diclofenac (VOLTAREN) 75 MG EC tablet Take 1 tablet (75 mg  total) by mouth 2 (two) times daily as needed. 12/11/16  Yes Jacquelin HawkingShannon McElroy, PA-C  insulin aspart (NOVOLOG) 100 UNIT/ML injection Inject 10 Units into the skin 3 (three) times daily before meals. 10 units plus sliding scales.   Yes Historical Provider, MD  insulin detemir (LEVEMIR) 100 UNIT/ML injection Inject 55 Units into the skin at bedtime. Reported on 04/16/2016   Yes Historical Provider, MD  lovastatin (MEVACOR) 20 MG tablet Take 1 tablet (20 mg total) by mouth at bedtime. 12/11/16  Yes Jacquelin HawkingShannon McElroy, PA-C  metFORMIN (GLUCOPHAGE) 500 MG tablet Take 1 tablet (500 mg total) by mouth 2 (two) times daily with a meal. 09/08/15  Yes Vanetta MuldersScott Zackowski, MD  sitaGLIPtin (JANUVIA) 100 MG tablet Take 1 tablet (100 mg total) by mouth daily. 10/07/16  Yes Jacquelin HawkingShannon McElroy, PA-C  tetrahydrozoline-zinc (VISINE-AC) 0.05-0.25 % ophthalmic solution Place 2 drops into both eyes 3 (three) times daily as needed.   Yes Historical Provider, MD  traMADol (ULTRAM) 50 MG tablet Take 1 tablet (50 mg total) by mouth every 6 (six) hours as needed. 09/04/16  Yes Burgess AmorJulie Idol, PA-C     ALLERGIES:  Allergies  Allergen Reactions  . Shellfish Allergy Anaphylaxis, Shortness Of Breath and Swelling    Facial Swelling  . Percocet [Oxycodone-Acetaminophen] Itching and Nausea And Vomiting     SOCIAL HISTORY:  Social History   Social History  . Marital status: Married    Spouse name: Christena DeemShane Pryor  . Number of children: N/A  . Years of education: N/A   Occupational History  .  CNA personal care giver    Social History Main Topics  . Smoking status: Current Every Day Smoker    Packs/day: 0.25    Types: Cigarettes  . Smokeless tobacco: Never Used     Comment: has cut down to 4-5 qd  . Alcohol use No  . Drug use: No  . Sexual activity: Yes    Partners: Male    Birth control/ protection: None   Other Topics Concern  . Not on file   Social History Narrative  . No narrative on file    The patient currently resides  (home / rehab facility / nursing home): Home  The patient normally is (ambulatory / bedbound): Ambulatory   FAMILY HISTORY:  Family History  Problem Relation Age of Onset  . Diabetes Father      REVIEW OF SYSTEMS:  Constitutional: denies weight loss, fever, chills, or sweats  Eyes: denies any other vision changes, history of eye injury  ENT: denies sore throat, hearing problems  Respiratory: denies shortness of breath, wheezing  Cardiovascular: denies chest pain, palpitations  Gastrointestinal: denies abdominal pain, though reports nausea and loose BM's since starting antibiotics yesterday Genitourinary: denies burning with urination or urinary frequency Musculoskeletal: denies any other joint pains or cramps  Skin: denies any other rashes or skin discolorations except as per HPI Neurological: denies any other headache, dizziness, weakness  Psychiatric: denies any other depression, anxiety   All other review of systems were negative   VITAL SIGNS:  Temp:  [97.9 F (36.6 C)-98 F (36.7 C)] 98 F (36.7 C) (01/13 0530) Pulse Rate:  [75-86] 77 (01/13 0530) Resp:  [18-20] 18 (01/13 0530) BP: (115-144)/(50-78) 115/50 (01/13 0530) SpO2:  [100 %] 100 % (01/13 0530) Weight:  [154.2 kg (340 lb)] 154.2 kg (340 lb) (01/12 2000)     Height: 5\' 6"  (167.6 cm) Weight: (!) 154.2 kg (340 lb) BMI (Calculated): 55   INTAKE/OUTPUT:  This shift: No intake/output data recorded.  Last 2 shifts: @IOLAST2SHIFTS @   PHYSICAL EXAM:  Constitutional:  -- Morbidly obese body habitus  -- Awake, alert, and oriented x3  Eyes:  -- Pupils equally round and reactive to light  -- No scleral icterus  Ear, nose, and throat:  -- No jugular venous distension  Pulmonary:  -- No crackles  -- Equally decreased breath sounds bilaterally, attributable to body habitus -- Breathing non-labored at rest Cardiovascular:  -- S1, S2 present  -- No pericardial rubs Gastrointestinal:  -- Abdomen soft, obese,  nontender, nondistended, no guarding/rebound  -- No abdominal masses appreciated, pulsatile or otherwise  Musculoskeletal and Integumentary:  -- Wounds or skin discoloration: Right 1st toe edema and tender hyperkeratosis and hardened skin over her distal Right 1st toe with punctate foci x2 of clear draining fluid without erythema or purulence or fluctuance -- Extremities: B/L UE and LE FROM, hands and feet warm, mild Right ankle edema > Left, no calf tenderness to palpation  Neurologic:  -- Motor function: intact and symmetric -- Sensation: intact and symmetric except diminished over Right 1st toe  Pulse/Doppler Exam: (p=palpable; d=doppler signals; 0=none)     Right   Left   Fem  p   p   DP  p   p   Labs:  CBC:  Lab Results  Component Value Date   WBC 9.3 12/13/2016   RBC 4.88 12/13/2016   BMP:  Lab Results  Component Value Date   GLUCOSE 268 (H) 12/13/2016   CO2 28 12/13/2016   BUN  10 12/13/2016   CREATININE 0.74 12/13/2016   CREATININE 0.67 09/12/2016   CALCIUM 8.8 (L) 12/13/2016     Imaging studies:  Right foot x-ray (12/14/2015) Large soft tissue wound is seen at the distal aspect of the great toe and soft tissues are markedly swollen about the toe and throughout the foot. There is bony destructive change of the tuft of the great toe consistent with osteomyelitis. No fracture or dislocation. No soft tissue gas.  Assessment/Plan: (ICD-10's: M19.271) 36 y.o. female with Right 1st toe subacute osteomyelitis, complicated by pertinent comorbidities including morbid obesity (BMI 55), baseline poorly controlled diabetes mellitus, tobacco abuse, non-alcoholic fatty liver disease, and macular degeneration.   - continue antibiotics as per primary medical team   - will plan for Right 1st toe amputation on Monday (pending OR availability)  - all risks, benefits, and alternatives to Right 1st toe amputation were discussed with the patient and her husband, patient elects to  proceed, all questions were answered to their expressed satisfaction, and informed consent was obtained  - patient expresses interest in bariatric weight loss program, will provide printed seminar info tomorrow as discussed  - topical betadine applied and Right 1st toe wrapped with telfa and gauze, may consider ACE compression  - absolute smoking cessation encouraged and discussed, particularly considering diabetes  - will need post-operative pressure offloading, non-weight-bearing to Right 1st toe  - will also need to continue and complete post-operative course of antibiotics  - medical management of co-morbidities  - DVT prophylaxis  All of the above findings and recommendations were discussed with the patient and her husband, and all of patient's and her husband's questions were answered to their expressed satisfaction.  Thank you for the opportunity to participate in this patient's care.   -- Scherrie Gerlach Earlene Plater, MD, RPVI [place]: [facility] General Surgery [facility] Office: [phone]

## 2016-12-14 NOTE — Progress Notes (Signed)
LCSW is aware of consult for diabetic care. Unable to assist with medication management, but will refer to CM and diabetic teaching.  Will sign off for now and have CM see patient for assistance.  Deretha EmoryHannah Ammar Moffatt LCSW, MSW Clinical Social Work: Optician, dispensingystem [employer]

## 2016-12-14 NOTE — Progress Notes (Signed)
Pharmacy Antibiotic Note  Rachel BarkerKaren George is a 36 y.o. female admitted on 12/13/2016 with osteomyelitis.  Pharmacy has been consulted for Vancomycin dosing.  Plan: Vancomycin 2000mg  loading dose, the 1500mg  IV every 8 hours.  Goal trough 15-20 mcg/mL.  Also on Ceftriaxone 2gm IV q24h F/U cxs and clinical progress Monitor V/S and labs, levels as indicated  Height: 5\' 6"  (167.6 cm) Weight: (!) 340 lb (154.2 kg) IBW/kg (Calculated) : 59.3  Temp (24hrs), Avg:97.9 F (36.6 C), Min:97.8 F (36.6 C), Max:98 F (36.7 C)   Recent Labs Lab 12/13/16 1524  WBC 9.3  CREATININE 0.74    Normalized CrCl 118 mls/min Estimated Creatinine Clearance: 149.3 mL/min (by C-G formula based on SCr of 0.74 mg/dL).    Allergies  Allergen Reactions  . Shellfish Allergy Anaphylaxis, Shortness Of Breath and Swelling    Facial Swelling  . Percocet [Oxycodone-Acetaminophen] Itching and Nausea And Vomiting    Antimicrobials this admission: Ceftriaxone 1/13>>  Vancomycin 1/13 >>   Dose adjustments this admission: n/a  Microbiology results: 1/13 BCx: pending  Thank you for allowing pharmacy to be a part of this patient's care. Rachel CyphersLorie Yassmine George, BS Rachel George D, [place] Clinical Pharmacist Pager [phone] 12/14/2016 5:30 PM

## 2016-12-14 NOTE — Progress Notes (Signed)
PROGRESS NOTE    Rachel George  ZOX:[id number] DOB: 24-Dec-1980 DOA: 12/13/2016 PCP: Jacquelin Hawking, PA-C    Brief Narrative:  36 year old female presented to the emergency room with chronic right lower leg swelling and wound on her right great toe for 3 months. X-ray showed evidence of osteomyelitis. She also had cellulitis of her right foot. She was admitted for intravenous antibiotics and surgical consult. Plans are for amputation right great toe.   Assessment & Plan:   Principal Problem:   Osteomyelitis (HCC) Active Problems:   Uncontrolled type 2 diabetes mellitus with complication (HCC)   Morbid obesity (HCC)   Diabetic foot ulcer (HCC)   Nicotine dependence   1. Osteomyelitis of right great toe. On intravenous antibiotics. General surgery following and plans on amputation.  2. Right leg swelling. Likely related to infection. Venous Dopplers ordered to rule out DVT  3. Diabetes. Oral medications currently on hold. Started on Levemir and sliding scale insulin. Check A1c  4. Elevated blood pressure. Patient does not report any history of hypertension. Blood pressure may be elevated related to pain. Continue to follow.  5. Morbid obesity. Counseled on the importance of diet and exercise. Patient is interested in pursuing bariatric surgery.   DVT prophylaxis: Heparin Code Status: Full code Family Communication: No family present Disposition Plan: Discharge home once improved.   Consultants:   Gen. surgery  Procedures:     Antimicrobials:   Rocephin 1/12>>  Vancomycin 1/13>>   Subjective: Has some nausea, feels is related to antibiotics. Has some pain in her toe.  Objective: Vitals:   12/13/16 2000 12/13/16 2100 12/14/16 0530 12/14/16 1517  BP:  132/74 (!) 115/50 (!) 154/89  Pulse:  75 77 78  Resp:  18 18 18   Temp:  98 F (36.7 C) 98 F (36.7 C) 97.8 F (36.6 C)  TempSrc:  Oral Oral Oral  SpO2:  100% 100% 98%  Weight: (!) 154.2 kg (340 lb)       Height: 5\' 6"  (1.676 m)       Intake/Output Summary (Last 24 hours) at 12/14/16 1721 Last data filed at 12/14/16 0310  Gross per 24 hour  Intake           628.75 ml  Output                0 ml  Net           628.75 ml   Filed Weights   12/13/16 1449 12/13/16 2000  Weight: (!) 154.2 kg (340 lb) (!) 154.2 kg (340 lb)    Examination:  General exam: Appears calm and comfortable  Respiratory system: Clear to auscultation. Respiratory effort normal. Cardiovascular system: S1 & S2 heard, RRR. No JVD, murmurs, rubs, gallops or clicks. No pedal edema. Gastrointestinal system: Abdomen is nondistended, soft and nontender. No organomegaly or masses felt. Normal bowel sounds heard. Central nervous system: Alert and oriented. No focal neurological deficits. Extremities: Symmetric 5 x 5 power. Right lower extremity swelling Skin: Right great toe with dressing in place. Right foot is warm, edematous Psychiatry: Judgement and insight appear normal. Mood & affect appropriate.     Data Reviewed: I have personally reviewed following labs and imaging studies  CBC:  Recent Labs Lab 12/13/16 1524  WBC 9.3  NEUTROABS 3.7  HGB 12.7  HCT 38.8  MCV 79.5  PLT 269   Basic Metabolic Panel:  Recent Labs Lab 12/13/16 1524  NA 135  K 4.4  CL 103  CO2 28  GLUCOSE 268*  BUN 10  CREATININE 0.74  CALCIUM 8.8*   GFR: Estimated Creatinine Clearance: 149.3 mL/min (by C-G formula based on SCr of 0.74 mg/dL). Liver Function Tests:  Recent Labs Lab 12/13/16 1524  AST 12*  ALT 11*  ALKPHOS 78  BILITOT 0.2*  PROT 7.0  ALBUMIN 3.1*   No results for input(s): LIPASE, AMYLASE in the last 168 hours. No results for input(s): AMMONIA in the last 168 hours. Coagulation Profile: No results for input(s): INR, PROTIME in the last 168 hours. Cardiac Enzymes: No results for input(s): CKTOTAL, CKMB, CKMBINDEX, TROPONINI in the last 168 hours. BNP (last 3 results) No results for input(s): PROBNP  in the last 8760 hours. HbA1C: No results for input(s): HGBA1C in the last 72 hours. CBG:  Recent Labs Lab 12/13/16 2117 12/14/16 0018 12/14/16 0532 12/14/16 0801 12/14/16 1152  GLUCAP 191* 191* 176* 201* 193*   Lipid Profile: No results for input(s): CHOL, HDL, LDLCALC, TRIG, CHOLHDL, LDLDIRECT in the last 72 hours. Thyroid Function Tests: No results for input(s): TSH, T4TOTAL, FREET4, T3FREE, THYROIDAB in the last 72 hours. Anemia Panel: No results for input(s): VITAMINB12, FOLATE, FERRITIN, TIBC, IRON, RETICCTPCT in the last 72 hours. Sepsis Labs: No results for input(s): PROCALCITON, LATICACIDVEN in the last 168 hours.  Recent Results (from the past 240 hour(s))  Culture, blood (Routine X 2) w Reflex to ID Panel     Status: None (Preliminary result)   Collection Time: 12/13/16  3:26 PM  Result Value Ref Range Status   Specimen Description RIGHT ANTECUBITAL  Final   Special Requests BOTTLES DRAWN AEROBIC AND ANAEROBIC [facility] EACH  Final   Culture PENDING  Incomplete   Report Status PENDING  Incomplete  Culture, blood (Routine X 2) w Reflex to ID Panel     Status: None (Preliminary result)   Collection Time: 12/13/16  3:37 PM  Result Value Ref Range Status   Specimen Description LEFT ANTECUBITAL  Final   Special Requests BOTTLES DRAWN AEROBIC AND ANAEROBIC [facility] EACH  Final   Culture PENDING  Incomplete   Report Status PENDING  Incomplete         Radiology Studies: Dg Foot Complete Right  Result Date: 12/13/2016 CLINICAL DATA:  Diabetic patient with a chronic wound on the right great toe. EXAM: RIGHT FOOT COMPLETE - 3+ VIEW COMPARISON:  Plain films the right foot 09/04/2016. FINDINGS: Large soft tissue wound is seen at the distal aspect of the great toe and soft tissues are markedly swollen about the toe and throughout the foot. There is bony destructive change of the tuft of the great toe consistent with osteomyelitis. No fracture or dislocation. No soft tissue gas.  IMPRESSION: Findings consistent with osteomyelitis in the distal phalanx of the great toe. Large soft tissue wound in the distal phalanx of the great toe and marked soft tissue swelling about the foot diffusely. Electronically Signed   By: Drusilla Kanner M.D.   On: 12/13/2016 15:19        Scheduled Meds: . cefTRIAXone (ROCEPHIN)  IV  2 g Intravenous Q24H  . heparin  5,000 Units Subcutaneous Q8H  . insulin aspart  0-15 Units Subcutaneous TID WC & HS  . insulin detemir  20 Units Subcutaneous QHS  . pneumococcal 23 valent vaccine  0.5 mL Intramuscular Tomorrow-1000  . pravastatin  40 mg Oral q1800   Continuous Infusions: . dextrose 5 % and 0.9% NaCl 75 mL/hr at 12/14/16 0952     LOS: 1 day  Time spent: 25mins    Vayden Weinand, MD [facility] Pager [phone]  If 7PM-7AM, please contact night-coverage [email or web address] Password [email or web address] 12/14/2016, 5:21 PM

## 2016-12-15 ENCOUNTER — Inpatient Hospital Stay (HOSPITAL_COMMUNITY): Payer: Self-pay

## 2016-12-15 LAB — GLUCOSE, CAPILLARY
GLUCOSE-CAPILLARY: 154 mg/dL — AB (ref 65–99)
GLUCOSE-CAPILLARY: 170 mg/dL — AB (ref 65–99)
GLUCOSE-CAPILLARY: 205 mg/dL — AB (ref 65–99)
Glucose-Capillary: 165 mg/dL — ABNORMAL HIGH (ref 65–99)
Glucose-Capillary: 192 mg/dL — ABNORMAL HIGH (ref 65–99)
Glucose-Capillary: 205 mg/dL — ABNORMAL HIGH (ref 65–99)

## 2016-12-15 LAB — BASIC METABOLIC PANEL
ANION GAP: 6 (ref 5–15)
BUN: 10 mg/dL (ref 6–20)
CALCIUM: 8.4 mg/dL — AB (ref 8.9–10.3)
CO2: 24 mmol/L (ref 22–32)
CREATININE: 0.74 mg/dL (ref 0.44–1.00)
Chloride: 105 mmol/L (ref 101–111)
GLUCOSE: 170 mg/dL — AB (ref 65–99)
Potassium: 4 mmol/L (ref 3.5–5.1)
Sodium: 135 mmol/L (ref 135–145)

## 2016-12-15 MED ORDER — CHLORHEXIDINE GLUCONATE CLOTH 2 % EX PADS: 6.0000 | MEDICATED_PAD | Freq: Once | CUTANEOUS | Status: DC

## 2016-12-15 MED ORDER — VANCOMYCIN HCL IN DEXTROSE 750-5 MG/150ML-% IV SOLN
INTRAVENOUS | Status: AC
  Filled 2016-12-15: qty 150

## 2016-12-15 MED ORDER — CHLORHEXIDINE GLUCONATE CLOTH 2 % EX PADS
6.0000 | MEDICATED_PAD | Freq: Once | CUTANEOUS | Status: AC
  Administered 2016-12-16: 6 via TOPICAL

## 2016-12-15 MED ORDER — SODIUM CHLORIDE 0.9 % IV SOLN
INTRAVENOUS | Status: AC
  Filled 2016-12-15: qty 1500

## 2016-12-15 NOTE — Progress Notes (Signed)
SURGICAL PROGRESS NOTE (cpt [id number])  Hospital Day(s): 2.   Post op day(s):  Marland Kitchen.   Interval History: Patient seen and examined, no acute events or new complaints overnight. Patient reports mild Right toe soreness unchanged, denies fever/chills, CP, or SOB.  Review of Systems:  Constitutional: denies fever, chills  HEENT: denies cough or congestion  Respiratory: denies any shortness of breath  Cardiovascular: denies chest pain or palpitations  Gastrointestinal: denies abdominal pain, though reports nausea and loose BM's since starting Genitourinary: denies burning with urination or urinary frequency Musculoskeletal: denies pain, decreased motor or sensation Integumentary: denies any other rashes or skin discolorations except as per interval history Neurological: denies HA or vision/hearing changes   Vital signs in last 24 hours: [min-max] current  Temp:  [97.6 F (36.4 C)-98.4 F (36.9 C)] 97.6 F (36.4 C) (01/14 0626) Pulse Rate:  [70-85] 70 (01/14 0626) Resp:  [18] 18 (01/14 0626) BP: (119-154)/(64-97) 152/64 (01/14 0626) SpO2:  [98 %-100 %] 100 % (01/14 0626)     Height: 5\' 6"  (167.6 cm) Weight: (!) 154.2 kg (340 lb) BMI (Calculated): 55   Intake/Output this shift:  Total I/O In: 240 [P.O.:240] Out: -    Intake/Output last 2 shifts:  @IOLAST2SHIFTS @   Physical Exam:  Constitutional: alert, cooperative and no distress  HENT: normocephalic without obvious abnormality  Eyes: PERRL, EOM's grossly intact and symmetric  Neuro: CN II - XII grossly intact and symmetric without deficit  Respiratory: breathing non-labored at rest  Cardiovascular: regular rate and sinus rhythm  Gastrointestinal: soft, non-tender, and non-distended  Musculoskeletal: UE and LE FROM, hands and feet warm, mild Right ankle edema > Left, no calf tenderness to palpation, motor and sensation grossly intact and symmetric except sensation dimminished over Right 1st toe Integumentary: Right 1st toe edema  and tender hyperkeratosis and hardened skin over her distal Right 1st toe with punctate foci x2 of clear draining fluid without erythema or purulence or fluctuance  Pulse/Doppler Exam: (p=palpable; d=doppler signals; 0=none)     Right   Left   Fem  p   p   DP  p   p   Labs:  CBC:  Lab Results  Component Value Date   WBC 9.3 12/13/2016   RBC 4.88 12/13/2016   BMP:  Lab Results  Component Value Date   GLUCOSE 170 (H) 12/15/2016   CO2 24 12/15/2016   BUN 10 12/15/2016   CREATININE 0.74 12/15/2016   CREATININE 0.67 09/12/2016   CALCIUM 8.4 (L) 12/15/2016     Imaging studies: No new pertinent imaging studies   Assessment/Plan: (ICD-10's: M86.271) 36 y.o. female with Right 1st toe subacute osteomyelitis, complicated by pertinent comorbidities including morbid obesity (BMI 55), baseline poorly controlled diabetes mellitus, tobacco abuse, non-alcoholic fatty liver disease, and macular degeneration.              - continue antibiotics as per primary medical team              - NPO after midnight tonight for Right 1st toe amputation tomorrow (pending OR availability)             - all risks, benefits, and alternatives to Right 1st toe amputation have been discussed with the patient and her husband, patient elects to proceed, all questions were answered to their expressed satisfaction, and informed consent was obtained             - patient expresses interest in [facility] bariatric weight loss programs, printed seminar info  provided as requested             - topical betadine applied and Right 1st toe wrapped with gauze and ACE wrap for compression             - absolute smoking cessation encouraged and discussed, particularly considering diabetes             - will need post-operative pressure offloading, non-weight-bearing to Right 1st toe             - will also need to continue and complete post-operative course of antibiotics             - medical management of co-morbidities as per  medical team  All of the above findings and recommendations were discussed with the patient and her husband, and all of patient's and her husband's questions were answered to their expressed satisfaction.  Thank you for the opportunity to participate in this patient's care.   -- Scherrie Gerlach Earlene Plater, MD, RPVI : [facility] Office: [phone]

## 2016-12-15 NOTE — Progress Notes (Signed)
PROGRESS NOTE    Rachel George  BJY:[id number] DOB: 10-12-1981 DOA: 12/13/2016 PCP: Jacquelin Hawking, PA-C    Brief Narrative:  36 year old female presented to the emergency room with chronic right lower leg swelling and wound on her right great toe for 3 months. X-ray showed evidence of osteomyelitis. She also had cellulitis of her right foot. She was admitted for intravenous antibiotics and surgical consult. Plans are for amputation right great toe.   Assessment & Plan:   Principal Problem:   Osteomyelitis (HCC) Active Problems:   Uncontrolled type 2 diabetes mellitus with complication (HCC)   Morbid obesity (HCC)   Diabetic foot ulcer (HCC)   Nicotine dependence   1. Osteomyelitis of right great toe. On intravenous antibiotics (vancomycin and ceftriaxone). General surgery following and plans on amputation.  2. Right leg swelling. Likely related to infection. Venous Dopplers negative for DVT  3. Diabetes. Oral medications currently on hold. Started on Levemir and sliding scale insulin. A1c in process. Blood sugars have been stable.  4. Elevated blood pressure. Patient does not report any history of hypertension. Blood pressure may be elevated related to pain. Continue to follow.  5. Morbid obesity. Counseled on the importance of diet and exercise. Patient is interested in pursuing bariatric surgery.   DVT prophylaxis: Heparin Code Status: Full code Family Communication: No family present Disposition Plan: Discharge home once improved.   Consultants:   Gen. surgery  Procedures:     Antimicrobials:   Rocephin 1/12>>  Vancomycin 1/13>>   Subjective: Right foot is less painful. Feels antibiotics are helping.  Objective: Vitals:   12/14/16 2144 12/15/16 0622 12/15/16 0626 12/15/16 1405  BP: (!) 137/97 119/68 (!) 152/64 115/66  Pulse: 80 85 70 88  Resp: 18 18 18 18   Temp: 98.2 F (36.8 C) 98.4 F (36.9 C) 97.6 F (36.4 C) 98.4 F (36.9 C)  TempSrc:  Oral Oral Oral Oral  SpO2: 100% 99% 100% 100%  Weight:      Height:        Intake/Output Summary (Last 24 hours) at 12/15/16 1541 Last data filed at 12/15/16 1011  Gross per 24 hour  Intake              790 ml  Output                4 ml  Net              786 ml   Filed Weights   12/13/16 1449 12/13/16 2000  Weight: (!) 154.2 kg (340 lb) (!) 154.2 kg (340 lb)    Examination:  General exam: Appears calm and comfortable  Respiratory system: Clear to auscultation. Respiratory effort normal. Cardiovascular system: S1 & S2 heard, RRR. No JVD, murmurs, rubs, gallops or clicks. No pedal edema. Gastrointestinal system: Abdomen is nondistended, soft and nontender. No organomegaly or masses felt. Normal bowel sounds heard. Central nervous system: Alert and oriented. No focal neurological deficits. Extremities: Symmetric 5 x 5 power. Right lower extremity swelling Skin: Right great toe with dressing in place. Right foot is warm (improving), edematous Psychiatry: Judgement and insight appear normal. Mood & affect appropriate.     Data Reviewed: I have personally reviewed following labs and imaging studies  CBC:  Recent Labs Lab 12/13/16 1524  WBC 9.3  NEUTROABS 3.7  HGB 12.7  HCT 38.8  MCV 79.5  PLT 269   Basic Metabolic Panel:  Recent Labs Lab 12/13/16 1524 12/15/16 0620  NA 135 135  K  4.4 4.0  CL 103 105  CO2 28 24  GLUCOSE 268* 170*  BUN 10 10  CREATININE 0.74 0.74  CALCIUM 8.8* 8.4*   GFR: Estimated Creatinine Clearance: 149.3 mL/min (by C-G formula based on SCr of 0.74 mg/dL). Liver Function Tests:  Recent Labs Lab 12/13/16 1524  AST 12*  ALT 11*  ALKPHOS 78  BILITOT 0.2*  PROT 7.0  ALBUMIN 3.1*   No results for input(s): LIPASE, AMYLASE in the last 168 hours. No results for input(s): AMMONIA in the last 168 hours. Coagulation Profile: No results for input(s): INR, PROTIME in the last 168 hours. Cardiac Enzymes: No results for input(s): CKTOTAL,  CKMB, CKMBINDEX, TROPONINI in the last 168 hours. BNP (last 3 results) No results for input(s): PROBNP in the last 8760 hours. HbA1C: No results for input(s): HGBA1C in the last 72 hours. CBG:  Recent Labs Lab 12/14/16 2032 12/15/16 0018 12/15/16 0428 12/15/16 0728 12/15/16 1126  GLUCAP 204* 205* 192* 154* 170*   Lipid Profile: No results for input(s): CHOL, HDL, LDLCALC, TRIG, CHOLHDL, LDLDIRECT in the last 72 hours. Thyroid Function Tests: No results for input(s): TSH, T4TOTAL, FREET4, T3FREE, THYROIDAB in the last 72 hours. Anemia Panel: No results for input(s): VITAMINB12, FOLATE, FERRITIN, TIBC, IRON, RETICCTPCT in the last 72 hours. Sepsis Labs: No results for input(s): PROCALCITON, LATICACIDVEN in the last 168 hours.  Recent Results (from the past 240 hour(s))  Culture, blood (Routine X 2) w Reflex to ID Panel     Status: None (Preliminary result)   Collection Time: 12/13/16  3:26 PM  Result Value Ref Range Status   Specimen Description RIGHT ANTECUBITAL  Final   Special Requests BOTTLES DRAWN AEROBIC AND ANAEROBIC 6CC EACH  Final   Culture NO GROWTH 2 DAYS  Final   Report Status PENDING  Incomplete  Culture, blood (Routine X 2) w Reflex to ID Panel     Status: None (Preliminary result)   Collection Time: 12/13/16  3:37 PM  Result Value Ref Range Status   Specimen Description LEFT ANTECUBITAL  Final   Special Requests BOTTLES DRAWN AEROBIC AND ANAEROBIC 6CC EACH  Final   Culture NO GROWTH 2 DAYS  Final   Report Status PENDING  Incomplete         Radiology Studies: [place] Venous Img Lower Unilateral Right  Result Date: 12/15/2016 CLINICAL DATA:  Right lower extremity pain and edema for the past several days. History of fall onto the right lower extremity while in the shower. History of smoking. Evaluate for DVT. EXAM: RIGHT LOWER EXTREMITY VENOUS DOPPLER ULTRASOUND TECHNIQUE: Gray-scale sonography with graded compression, as well as color Doppler and duplex  ultrasound were performed to evaluate the lower extremity deep venous systems from the level of the common femoral vein and including the common femoral, femoral, profunda femoral, popliteal and calf veins including the posterior tibial, peroneal and gastrocnemius veins when visible. The superficial great saphenous vein was also interrogated. Spectral Doppler was utilized to evaluate flow at rest and with distal augmentation maneuvers in the common femoral, femoral and popliteal veins. COMPARISON:  None. FINDINGS: Contralateral Common Femoral Vein: Respiratory phasicity is normal and symmetric with the symptomatic side. No evidence of thrombus. Normal compressibility. Common Femoral Vein: No evidence of thrombus. Normal compressibility, respiratory phasicity and response to augmentation. Saphenofemoral Junction: No evidence of thrombus. Normal compressibility and flow on color Doppler imaging. Profunda Femoral Vein: No evidence of thrombus. Normal compressibility and flow on color Doppler imaging. Femoral Vein: No evidence of  thrombus. Normal compressibility, respiratory phasicity and response to augmentation. Popliteal Vein: No evidence of thrombus. Normal compressibility, respiratory phasicity and response to augmentation. Calf Veins: No evidence of thrombus. Normal compressibility and flow on color Doppler imaging. Superficial Great Saphenous Vein: No evidence of thrombus. Normal compressibility and flow on color Doppler imaging. Venous Reflux:  None. Other Findings: A moderate amount of subcutaneous edema is noted at the level the right calf (image 39 and images 52 through 56). IMPRESSION: 1. No evidence of DVT within the right lower extremity. Electronically Signed   By: Simonne Come M.D.   On: 12/15/2016 15:00        Scheduled Meds: . cefTRIAXone (ROCEPHIN)  IV  2 g Intravenous Q24H  . heparin  5,000 Units Subcutaneous Q8H  . insulin aspart  0-15 Units Subcutaneous TID WC & HS  . insulin detemir  20  Units Subcutaneous QHS  . pneumococcal 23 valent vaccine  0.5 mL Intramuscular Tomorrow-1000  . pravastatin  40 mg Oral q1800  . vancomycin  1,500 mg Intravenous Q8H   Continuous Infusions:    LOS: 2 days    Time spent:    MEMON,JEHANZEB, MD [facility] Pager [phone]  If 7PM-7AM, please contact night-coverage [email or web address] Password [facility] 12/15/2016, 3:41 PM

## 2016-12-16 ENCOUNTER — Inpatient Hospital Stay (HOSPITAL_COMMUNITY): Payer: Self-pay | Admitting: Anesthesiology

## 2016-12-16 ENCOUNTER — Encounter (HOSPITAL_COMMUNITY): Payer: Self-pay | Admitting: Anesthesiology

## 2016-12-16 ENCOUNTER — Encounter (HOSPITAL_COMMUNITY)
Admission: EM | Disposition: A | Discharge status: Home / Self Care | Payer: Self-pay | Source: Home / Self Care | Attending: Internal Medicine

## 2016-12-16 DIAGNOSIS — E11621 Type 2 diabetes mellitus with foot ulcer: Secondary | ICD-10-CM

## 2016-12-16 DIAGNOSIS — L97512 Non-pressure chronic ulcer of other part of right foot with fat layer exposed: Secondary | ICD-10-CM

## 2016-12-16 HISTORY — PX: AMPUTATION TOE: SHX6595

## 2016-12-16 LAB — GLUCOSE, CAPILLARY
GLUCOSE-CAPILLARY: 168 mg/dL — AB (ref 65–99)
GLUCOSE-CAPILLARY: 132 mg/dL — AB (ref 65–99)
Glucose-Capillary: 148 mg/dL — ABNORMAL HIGH (ref 65–99)
Glucose-Capillary: 159 mg/dL — ABNORMAL HIGH (ref 65–99)
Glucose-Capillary: 183 mg/dL — ABNORMAL HIGH (ref 65–99)
Glucose-Capillary: 196 mg/dL — ABNORMAL HIGH (ref 65–99)
Glucose-Capillary: 208 mg/dL — ABNORMAL HIGH (ref 65–99)

## 2016-12-16 LAB — HEMOGLOBIN A1C
Hgb A1c MFr Bld: 10.6 % — ABNORMAL HIGH (ref 4.8–5.6)
MEAN PLASMA GLUCOSE: 258 mg/dL

## 2016-12-16 SURGERY — AMPUTATION, TOE
Anesthesia: General | Laterality: Right

## 2016-12-16 MED ORDER — GLYCOPYRROLATE 0.2 MG/ML IJ SOLN
0.2000 mg | Freq: Once | INTRAMUSCULAR | Status: AC
  Administered 2016-12-16: 0.2 mg via INTRAVENOUS

## 2016-12-16 MED ORDER — BUPIVACAINE HCL (PF) 0.5 % IJ SOLN
INTRAMUSCULAR | Status: AC
  Filled 2016-12-16: qty 30

## 2016-12-16 MED ORDER — HYDROMORPHONE HCL 1 MG/ML IJ SOLN: 0.2500 mg | INTRAMUSCULAR | Status: DC | PRN

## 2016-12-16 MED ORDER — SUCCINYLCHOLINE CHLORIDE 20 MG/ML IJ SOLN
INTRAMUSCULAR | Status: DC | PRN
  Administered 2016-12-16: 180 mg via INTRAVENOUS

## 2016-12-16 MED ORDER — ROCURONIUM BROMIDE 50 MG/5ML IV SOLN
INTRAVENOUS | Status: AC
  Filled 2016-12-16: qty 1

## 2016-12-16 MED ORDER — ROCURONIUM BROMIDE 100 MG/10ML IV SOLN
INTRAVENOUS | Status: DC | PRN
  Administered 2016-12-16: 10 mg via INTRAVENOUS

## 2016-12-16 MED ORDER — FENTANYL CITRATE (PF) 250 MCG/5ML IJ SOLN
INTRAMUSCULAR | Status: AC
  Filled 2016-12-16: qty 5

## 2016-12-16 MED ORDER — PROPOFOL 10 MG/ML IV BOLUS
INTRAVENOUS | Status: DC | PRN
  Administered 2016-12-16: 200 mg via INTRAVENOUS

## 2016-12-16 MED ORDER — GLYCOPYRROLATE 0.2 MG/ML IJ SOLN
INTRAMUSCULAR | Status: AC
  Filled 2016-12-16: qty 1

## 2016-12-16 MED ORDER — LIDOCAINE HCL (PF) 1 % IJ SOLN
INTRAMUSCULAR | Status: AC
  Filled 2016-12-16: qty 5

## 2016-12-16 MED ORDER — ONDANSETRON HCL 4 MG/2ML IJ SOLN
4.0000 mg | Freq: Once | INTRAMUSCULAR | Status: AC
  Administered 2016-12-16: 4 mg via INTRAVENOUS
  Filled 2016-12-16: qty 2

## 2016-12-16 MED ORDER — FENTANYL CITRATE (PF) 100 MCG/2ML IJ SOLN
INTRAMUSCULAR | Status: DC | PRN
  Administered 2016-12-16: 100 ug via INTRAVENOUS

## 2016-12-16 MED ORDER — FENTANYL CITRATE (PF) 100 MCG/2ML IJ SOLN
INTRAMUSCULAR | Status: AC
  Filled 2016-12-16: qty 2

## 2016-12-16 MED ORDER — SUCCINYLCHOLINE CHLORIDE 20 MG/ML IJ SOLN
INTRAMUSCULAR | Status: AC
  Filled 2016-12-16: qty 1

## 2016-12-16 MED ORDER — 0.9 % SODIUM CHLORIDE (POUR BTL) OPTIME
TOPICAL | Status: DC | PRN
  Administered 2016-12-16: 1000 mL

## 2016-12-16 MED ORDER — MIDAZOLAM HCL 2 MG/2ML IJ SOLN
INTRAMUSCULAR | Status: AC
  Filled 2016-12-16: qty 2

## 2016-12-16 MED ORDER — OXYCODONE-ACETAMINOPHEN 5-325 MG PO TABS
1.0000 | ORAL_TABLET | ORAL | Status: DC | PRN
  Filled 2016-12-16: qty 1

## 2016-12-16 MED ORDER — BACITRACIN ZINC 500 UNIT/GM EX OINT
TOPICAL_OINTMENT | CUTANEOUS | Status: AC
  Filled 2016-12-16: qty 0.9

## 2016-12-16 MED ORDER — LACTATED RINGERS IV SOLN
INTRAVENOUS | Status: DC
  Administered 2016-12-16: 1000 mL via INTRAVENOUS

## 2016-12-16 MED ORDER — MIDAZOLAM HCL 2 MG/2ML IJ SOLN
0.5000 mg | INTRAMUSCULAR | Status: DC | PRN
Start: 2016-12-16 — End: 2016-12-16
  Administered 2016-12-16: 2 mg via INTRAVENOUS

## 2016-12-16 MED ORDER — SUCCINYLCHOLINE CHLORIDE 20 MG/ML IJ SOLN
INTRAMUSCULAR | Status: AC
Start: 2016-12-16 — End: 2016-12-16
  Filled 2016-12-16: qty 1

## 2016-12-16 MED ORDER — PROPOFOL 10 MG/ML IV BOLUS
INTRAVENOUS | Status: AC
  Filled 2016-12-16: qty 20

## 2016-12-16 MED ORDER — LIDOCAINE HCL 1 % IJ SOLN
INTRAMUSCULAR | Status: DC | PRN
  Administered 2016-12-16: 12 mL

## 2016-12-16 MED ORDER — BACITRACIN 500 UNIT/GM EX OINT
TOPICAL_OINTMENT | CUTANEOUS | Status: DC | PRN
  Administered 2016-12-16: 1 via TOPICAL

## 2016-12-16 SURGICAL SUPPLY — 39 items
BAG HAMPER (MISCELLANEOUS) ×3 IMPLANT
BANDAGE ELASTIC 4 LF NS (GAUZE/BANDAGES/DRESSINGS) IMPLANT
BANDAGE ELASTIC 4 VELCRO ST LF (GAUZE/BANDAGES/DRESSINGS) ×2 IMPLANT
BNDG CMPR MED 5X4 ELC HKLP NS (GAUZE/BANDAGES/DRESSINGS)
BNDG GAUZE ELAST 4 BULKY (GAUZE/BANDAGES/DRESSINGS) IMPLANT
BNDG GAUZE ROLL STR 2.25X3YD (GAUZE/BANDAGES/DRESSINGS) ×2 IMPLANT
BNDG GZE SM 3X2.25 6 PLY (GAUZE/BANDAGES/DRESSINGS) ×1
CLOTH BEACON ORANGE TIMEOUT ST (SAFETY) ×3 IMPLANT
COVER LIGHT HANDLE STERIS (MISCELLANEOUS) ×6 IMPLANT
DECANTER SPIKE VIAL GLASS SM (MISCELLANEOUS) ×6 IMPLANT
ELECT REM PT RETURN 9FT ADLT (ELECTROSURGICAL) ×3
ELECTRODE REM PT RTRN 9FT ADLT (ELECTROSURGICAL) ×1 IMPLANT
FORMALIN 10 PREFIL 480ML (MISCELLANEOUS) ×3 IMPLANT
GAUZE SPONGE 4X4 12PLY STRL (GAUZE/BANDAGES/DRESSINGS) ×3 IMPLANT
GAUZE SPONGE 4X4 8PLY STR LF (GAUZE/BANDAGES/DRESSINGS) ×2 IMPLANT
GLOVE BIOGEL PI IND STRL 7.0 (GLOVE) ×2 IMPLANT
GLOVE BIOGEL PI IND STRL 7.5 (GLOVE) ×1 IMPLANT
GLOVE BIOGEL PI INDICATOR 7.0 (GLOVE) ×4
GLOVE BIOGEL PI INDICATOR 7.5 (GLOVE) ×2
GLOVE ECLIPSE 7.0 STRL STRAW (GLOVE) ×3 IMPLANT
GOWN STRL REUS W/TWL LRG LVL3 (GOWN DISPOSABLE) ×9 IMPLANT
KIT ROOM TURNOVER APOR (KITS) ×3 IMPLANT
MANIFOLD NEPTUNE II (INSTRUMENTS) ×3 IMPLANT
NDL HYPO 25X1 1.5 SAFETY (NEEDLE) ×1 IMPLANT
NEEDLE HYPO 25X1 1.5 SAFETY (NEEDLE) ×3 IMPLANT
NS IRRIG 1000ML POUR BTL (IV SOLUTION) ×3 IMPLANT
PACK BASIC LIMB (CUSTOM PROCEDURE TRAY) ×3 IMPLANT
PAD ARMBOARD 7.5X6 YLW CONV (MISCELLANEOUS) ×3 IMPLANT
SET BASIN LINEN APH (SET/KITS/TRAYS/PACK) ×3 IMPLANT
SOL PREP PROV IODINE SCRUB 4OZ (MISCELLANEOUS) ×3 IMPLANT
SPONGE LAP 18X18 X RAY DECT (DISPOSABLE) ×3 IMPLANT
SUT BONE WAX W31G (SUTURE) ×3 IMPLANT
SUT ETHILON 3 0 FSL (SUTURE) ×3 IMPLANT
SUT PROLENE 2 0 FS (SUTURE) IMPLANT
SUT VIC AB 2-0 CT2 27 (SUTURE) ×3 IMPLANT
SUT VIC AB 3-0 SH 27 (SUTURE)
SUT VIC AB 3-0 SH 27XBRD (SUTURE) IMPLANT
SYR CONTROL 10ML LL (SYRINGE) ×3 IMPLANT
TUBE ANAEROBIC PORT A CUL  W/M (MISCELLANEOUS) IMPLANT

## 2016-12-16 NOTE — Op Note (Signed)
SURGICAL OPERATIVE REPORT  DATE OF PROCEDURE: 12/16/2016  ATTENDING Surgeon(s): Ancil LinseyJason Evan Charnell Peplinski, MD  ANESTHESIA: general   PRE-OPERATIVE DIAGNOSIS: Right 1st toe osteomyelitis (icd-10's: M86.271)  POST-OPERATIVE DIAGNOSIS: Right 1st toe osteomyelitis (icd-10's: M86.271)  PROCEDURE(S):  1.) Right first toe amputation (cpt: 28820-T5)  INTRAOPERATIVE FINDINGS: Well perfused Right 1st toe with no evidence of infection more proximal to osteomyelitis  INTRAVENOUS FLUIDS: 600 mL crystalloid   ESTIMATED BLOOD LOSS: Minimal (< 30 mL)  URINE OUTPUT: No foley   SPECIMENS: Right 1st toe for culture  IMPLANTS: None  DRAINS: none  COMPLICATIONS: None apparent  CONDITION AT END OF PROCEDURE: Hemodynamically stable and extubated  PULSE/DOPPLER EXAM AT END OF PROCEDURE:   (p=palpable; d=doppler signals; 0=none)     Right   Left   Fem  p   p   DP  p   p   DISPOSITION OF PATIENT: PACU  INDICATIONS FOR PROCEDURE:   36 year old morbidly obese female with poorly controlled diabetes mellitus presented to [facility] ED with Right 1st toe pain and swelling x 3-5 days associated with a traumatic Right 1st toe wound she incurred when she slipped and fell onto her Right leg in the shower 3.5 months ago (10/1). Despite wound care since that time, the wound has overall not improved. X-ray at time of admission demonstrated Right first toe osteomyelitis, Right 1st toe amputation was advised, and surgery was consulted accordingly.  All risks, benefits, and alternatives to above procedure were discussed with the patient, all of patient's questions were answered to her expressed satisfaction, and informed consent was obtained and documented.  DETAILS OF PROCEDURE: Patient was brought to the operating suite and appropriately identified. General anesthesia was administered along with appropriate pre-operative antibiotics, and endotracheal intubation was performed by anesthetist. In supine position, operative  site was prepped and draped in the usual sterile fashion, and following a brief time out, initial circumferential fishmouth incision was made at the base of the 1st toe using a #15 blade scalpel and extended deep to bone using combined sharp dissection and electrocautery. Bone was resected further to permit closure and appeared to be of normal firmness without evidence of more proximal osteomyelitis or soft tissue infection. Bone edges were smoothed to prevent any pressure points, and bone wax was applied to facilitate hemostasis of bone marrow. Operative site was copiously irrigated with warm saline, and hemostasis was once more confirmed. Dermis was re-approximated using buried interrupted 3-0 Vicryl sutures, and skin was approximated using 3-0 nylon vertical mattress sutures. Skin was then clean and dried, and sterile bacitracin, gauze, kerlix, and ACE wrap were applied. Patient was then safely able to be extubated, awaken, and transferred to PACU for post-operative monitoring and care. I was present for all aspects of the above procedure, and there were no complications apparent.

## 2016-12-16 NOTE — Anesthesia Procedure Notes (Signed)
Procedure Name: Intubation Date/Time: 12/16/2016 1:17 PM Performed by: Pernell DupreADAMS, AMY A Pre-anesthesia Checklist: Patient identified, Patient being monitored, Timeout performed, Emergency Drugs available and Suction available Patient Re-evaluated:Patient Re-evaluated prior to inductionOxygen Delivery Method: Circle System Utilized Preoxygenation: Pre-oxygenation with 100% oxygen Intubation Type: IV induction, Rapid sequence and Cricoid Pressure applied Laryngoscope Size: Glidescope and 4 Grade View: Grade I Tube type: Oral Tube size: 7.0 mm Number of attempts: 1 Airway Equipment and Method: Stylet Placement Confirmation: ETT inserted through vocal cords under direct vision,  positive ETCO2 and breath sounds checked- equal and bilateral Secured at: 21 cm Tube secured with: Tape Dental Injury: Teeth and Oropharynx as per pre-operative assessment

## 2016-12-16 NOTE — Transfer of Care (Signed)
Immediate Anesthesia Transfer of Care Note  Patient: Rachel BarkerKaren George  Procedure(s) Performed: Procedure(s) with comments: RIGHT FIRST TOE AMPUTATION (Right) - Right first toe amputation for osteomyelitis  Patient Location: PACU  Anesthesia Type:General  Level of Consciousness: awake and patient cooperative  Airway & Oxygen Therapy: Patient Spontanous Breathing and non-rebreather face mask  Post-op Assessment: Report given to RN and Post -op Vital signs reviewed and stable  Post vital signs: Reviewed and stable  Last Vitals:  Vitals:   12/16/16 1255 12/16/16 1300  BP: 140/84 127/70  Pulse:    Resp: 12 16  Temp:      Last Pain:  Vitals:   12/16/16 1153  TempSrc: Oral  PainSc:          Complications: No apparent anesthesia complications

## 2016-12-16 NOTE — Progress Notes (Signed)
PROGRESS NOTE    Rachel BarkerKaren George  ZOX:[id number] DOB: [birth year] DOA: 12/13/2016 PCP: Jacquelin HawkingShannon McElroy, PA-C    Brief Narrative:  36 year old female presented to the emergency room with chronic right lower leg swelling and wound on her right great toe for 3 months. X-ray showed evidence of osteomyelitis. She also had cellulitis of her right foot. She was admitted for intravenous antibiotics and surgical consult. Plans are for amputation right great toe.   Assessment & Plan:   Principal Problem:   Osteomyelitis (HCC) Active Problems:   Uncontrolled type 2 diabetes mellitus with complication (HCC)   Morbid obesity (HCC)   Diabetic foot ulcer (HCC)   Nicotine dependence   1. Osteomyelitis of right great toe. On intravenous antibiotics (vancomycin and ceftriaxone). surrounding cellulitis improving. General surgery following and plans on amputation of great toe today.  2. Right leg swelling. Likely related to infection. Venous Dopplers negative for DVT  3. Diabetes. Oral medications currently on hold. Started on Levemir and sliding scale insulin. A1c in process. Blood sugars have been stable.  4. Elevated blood pressure. Patient does not report any history of hypertension. Blood pressure may be elevated related to pain. Continue to follow.  5. Morbid obesity. Counseled on the importance of diet and exercise. Patient is interested in pursuing bariatric surgery.   DVT prophylaxis: Heparin Code Status: Full code Family Communication: No family present Disposition Plan: Discharge home once improved.   Consultants:   Gen. surgery  Procedures:     Antimicrobials:   Rocephin 1/12>>  Vancomycin 1/13>>   Subjective: No new complaints  Objective: Vitals:   12/16/16 1450 12/16/16 1500 12/16/16 1515 12/16/16 1530  BP: (!) 147/72 (!) 141/65 (!) 142/64 (!) 142/68  Pulse: 77 88 78 78  Resp: 11 (!) 24 16 16   Temp: 97.5 F (36.4 C)     TempSrc:      SpO2: 97% 100% 96% 96%    Weight:      Height:        Intake/Output Summary (Last 24 hours) at 12/16/16 1734 Last data filed at 12/16/16 1421  Gross per 24 hour  Intake              600 ml  Output               20 ml  Net              580 ml   Filed Weights   12/13/16 1449 12/13/16 2000 12/16/16 1153  Weight: (!) 154.2 kg (340 lb) (!) 154.2 kg (340 lb) (!) 154.2 kg (340 lb)    Examination:  General exam: Appears calm and comfortable  Respiratory system: Clear to auscultation. Respiratory effort normal. Cardiovascular system: S1 & S2 heard, RRR. No JVD, murmurs, rubs, gallops or clicks. No pedal edema. Gastrointestinal system: Abdomen is nondistended, soft and nontender. No organomegaly or masses felt. Normal bowel sounds heard. Central nervous system: Alert and oriented. No focal neurological deficits. Extremities: Symmetric 5 x 5 power. Right lower extremity swelling Skin: Right great toe with dressing in place. Right foot is warm (improving), edematous Psychiatry: Judgement and insight appear normal. Mood & affect appropriate.     Data Reviewed: I have personally reviewed following labs and imaging studies  CBC:  Recent Labs Lab 12/13/16 1524  WBC 9.3  NEUTROABS 3.7  HGB 12.7  HCT 38.8  MCV 79.5  PLT 269   Basic Metabolic Panel:  Recent Labs Lab 12/13/16 1524 12/15/16 0620  NA 135 135  K 4.4 4.0  CL 103 105  CO2 28 24  GLUCOSE 268* 170*  BUN 10 10  CREATININE 0.74 0.74  CALCIUM 8.8* 8.4*   GFR: Estimated Creatinine Clearance: 149.3 mL/min (by C-G formula based on SCr of 0.74 mg/dL). Liver Function Tests:  Recent Labs Lab 12/13/16 1524  AST 12*  ALT 11*  ALKPHOS 78  BILITOT 0.2*  PROT 7.0  ALBUMIN 3.1*   No results for input(s): LIPASE, AMYLASE in the last 168 hours. No results for input(s): AMMONIA in the last 168 hours. Coagulation Profile: No results for input(s): INR, PROTIME in the last 168 hours. Cardiac Enzymes: No results for input(s): CKTOTAL, CKMB,  CKMBINDEX, TROPONINI in the last 168 hours. BNP (last 3 results) No results for input(s): PROBNP in the last 8760 hours. HbA1C:  Recent Labs  12/15/16 0620  HGBA1C 10.6*   CBG:  Recent Labs Lab 12/16/16 0726 12/16/16 1128 12/16/16 1206 12/16/16 1455 12/16/16 1609  GLUCAP 208* 159* 168* 132* 148*   Lipid Profile: No results for input(s): CHOL, HDL, LDLCALC, TRIG, CHOLHDL, LDLDIRECT in the last 72 hours. Thyroid Function Tests: No results for input(s): TSH, T4TOTAL, FREET4, T3FREE, THYROIDAB in the last 72 hours. Anemia Panel: No results for input(s): VITAMINB12, FOLATE, FERRITIN, TIBC, IRON, RETICCTPCT in the last 72 hours. Sepsis Labs: No results for input(s): PROCALCITON, LATICACIDVEN in the last 168 hours.  Recent Results (from the past 240 hour(s))  Culture, blood (Routine X 2) w Reflex to ID Panel     Status: None (Preliminary result)   Collection Time: 12/13/16  3:26 PM  Result Value Ref Range Status   Specimen Description RIGHT ANTECUBITAL  Final   Special Requests BOTTLES DRAWN AEROBIC AND ANAEROBIC 6CC EACH  Final   Culture NO GROWTH 3 DAYS  Final   Report Status PENDING  Incomplete  Culture, blood (Routine X 2) w Reflex to ID Panel     Status: None (Preliminary result)   Collection Time: 12/13/16  3:37 PM  Result Value Ref Range Status   Specimen Description LEFT ANTECUBITAL  Final   Special Requests BOTTLES DRAWN AEROBIC AND ANAEROBIC 6CC EACH  Final   Culture NO GROWTH 3 DAYS  Final   Report Status PENDING  Incomplete         Radiology Studies: US Venous Img Lower Unilateral Right  Result Date: 12/15/2016 CLINICAL DATA:  Right lower extremity pain and edema for the past several days. History of fall onto the right lower extremity while in the shower. History of smoking. Evaluate for DVT. EXAM: RIGHT LOWER EXTREMITY VENOUS DOPPLER ULTRASOUND TECHNIQUE: Gray-scale sonography with graded compression, as well as color Doppler and duplex ultrasound were  performed to evaluate the lower extremity deep venous systems from the level of the common femoral vein and including the common femoral, femoral, profunda femoral, popliteal and calf veins including the posterior tibial, peroneal and gastrocnemius veins when visible. The superficial great saphenous vein was also interrogated. Spectral Doppler was utilized to evaluate flow at rest and with distal augmentation maneuvers in the common femoral, femoral and popliteal veins. COMPARISON:  None. FINDINGS: Contralateral Common Femoral Vein: Respiratory phasicity is normal and symmetric with the symptomatic side. No evidence of thrombus. Normal compressibility. Common Femoral Vein: No evidence of thrombus. Normal compressibility, respiratory phasicity and response to augmentation. Saphenofemoral Junction: No evidence of thrombus. Normal compressibility and flow on color Doppler imaging. Profunda Femoral Vein: No evidence of thrombus. Normal compressibility and flow on color Doppler imaging. Femoral Vein: No  evidence of thrombus. Normal compressibility, respiratory phasicity and response to augmentation. Popliteal Vein: No evidence of thrombus. Normal compressibility, respiratory phasicity and response to augmentation. Calf Veins: No evidence of thrombus. Normal compressibility and flow on color Doppler imaging. Superficial Great Saphenous Vein: No evidence of thrombus. Normal compressibility and flow on color Doppler imaging. Venous Reflux:  None. Other Findings: A moderate amount of subcutaneous edema is noted at the level the right calf (image 39 and images 52 through 56). IMPRESSION: 1. No evidence of DVT within the right lower extremity. Electronically Signed   By: Simonne Come M.D.   On: 12/15/2016 15:00        Scheduled Meds: . cefTRIAXone (ROCEPHIN)  IV  2 g Intravenous Q24H  . heparin  5,000 Units Subcutaneous Q8H  . insulin aspart  0-15 Units Subcutaneous TID WC & HS  . insulin detemir  20 Units  Subcutaneous QHS  . pneumococcal 23 valent vaccine  0.5 mL Intramuscular Tomorrow-1000  . pravastatin  40 mg Oral q1800  . vancomycin  1,500 mg Intravenous Q8H   Continuous Infusions:    LOS: 3 days    Time spent:    Josephus Harriger, MD [facility] Pager [phone]  If 7PM-7AM, please contact night-coverage [email or web address] Password [email or web address] 12/16/2016, 5:34 PM

## 2016-12-16 NOTE — Anesthesia Postprocedure Evaluation (Signed)
Anesthesia Post Note  Patient: Rachel BarkerKaren George  Procedure(s) Performed: Procedure(s) (LRB): RIGHT FIRST TOE AMPUTATION (Right)  Patient location during evaluation: PACU Anesthesia Type: General Level of consciousness: awake and alert Pain management: pain level controlled Vital Signs Assessment: post-procedure vital signs reviewed and stable Respiratory status: spontaneous breathing Cardiovascular status: stable Anesthetic complications: no     Last Vitals:  Vitals:   12/16/16 1450 12/16/16 1500  BP: (!) 147/72 (!) 141/65  Pulse: 77 88  Resp: 11 (!) 24  Temp:      Last Pain:  Vitals:   12/16/16 1450  TempSrc:   PainSc: 0-No pain                 Kelten Enochs

## 2016-12-16 NOTE — Anesthesia Preprocedure Evaluation (Signed)
Anesthesia Evaluation  Patient identified by MRN, date of birth, ID band Patient awake    Reviewed: Allergy & Precautions, NPO status , Patient's Chart, lab work & pertinent test results  Airway        Dental   Pulmonary Current Smoker,           Cardiovascular negative cardio ROS       Neuro/Psych  Neuromuscular disease ( Diabetic polyneuropathy associated with type 2 diabetes mellitus )    GI/Hepatic GERD (nausea today)  ,  Endo/Other  diabetes, Poorly Controlled, Type obesity  Renal/GU      Musculoskeletal   Abdominal   Peds  Hematology   Anesthesia Other Findings   Reproductive/Obstetrics                             Anesthesia Physical Anesthesia Plan  ASA: III  Anesthesia Plan: General   Post-op Pain Management:    Induction: Intravenous, Rapid sequence and Cricoid pressure planned  Airway Management Planned: Oral ETT and Video Laryngoscope Planned  Additional Equipment:   Intra-op Plan:   Post-operative Plan: Extubation in OR  Informed Consent: I have reviewed the patients History and Physical, chart, labs and discussed the procedure including the risks, benefits and alternatives for the proposed anesthesia with the patient or authorized representative who has indicated his/her understanding and acceptance.     Plan Discussed with:   Anesthesia Plan Comments:         Anesthesia Quick Evaluation

## 2016-12-17 ENCOUNTER — Ambulatory Visit: Payer: Self-pay | Admitting: Physician Assistant

## 2016-12-17 DIAGNOSIS — Z794 Long term (current) use of insulin: Secondary | ICD-10-CM

## 2016-12-17 LAB — BASIC METABOLIC PANEL
Anion gap: 5 (ref 5–15)
BUN: 10 mg/dL (ref 6–20)
CHLORIDE: 104 mmol/L (ref 101–111)
CO2: 25 mmol/L (ref 22–32)
CREATININE: 0.74 mg/dL (ref 0.44–1.00)
Calcium: 8.2 mg/dL — ABNORMAL LOW (ref 8.9–10.3)
GFR calc Af Amer: >60 mL/min (ref >60)
GFR calc non Af Amer: >60 mL/min (ref >60)
Glucose, Bld: 196 mg/dL — ABNORMAL HIGH (ref 65–99)
Potassium: 3.9 mmol/L (ref 3.5–5.1)
Sodium: 134 mmol/L — ABNORMAL LOW (ref 135–145)

## 2016-12-17 LAB — CBC
HCT: 34.8 % — ABNORMAL LOW (ref 36.0–46.0)
Hemoglobin: 11.5 g/dL — ABNORMAL LOW (ref 12.0–15.0)
MCH: 26.5 pg (ref 26.0–34.0)
MCHC: 33 g/dL (ref 30.0–36.0)
MCV: 80.2 fL (ref 78.0–100.0)
PLATELETS: 237 10*3/uL (ref 150–400)
RBC: 4.34 MIL/uL (ref 3.87–5.11)
RDW: 15 % (ref 11.5–15.5)
WBC: 8.1 10*3/uL (ref 4.0–10.5)

## 2016-12-17 LAB — GLUCOSE, CAPILLARY
GLUCOSE-CAPILLARY: 273 mg/dL — AB (ref 65–99)
GLUCOSE-CAPILLARY: 214 mg/dL — AB (ref 65–99)
Glucose-Capillary: 126 mg/dL — ABNORMAL HIGH (ref 65–99)
Glucose-Capillary: 170 mg/dL — ABNORMAL HIGH (ref 65–99)
Glucose-Capillary: 200 mg/dL — ABNORMAL HIGH (ref 65–99)
Glucose-Capillary: 202 mg/dL — ABNORMAL HIGH (ref 65–99)
Glucose-Capillary: 207 mg/dL — ABNORMAL HIGH (ref 65–99)

## 2016-12-17 LAB — VANCOMYCIN, TROUGH: Vancomycin Tr: 29 ug/mL (ref 15–20)

## 2016-12-17 MED ORDER — VANCOMYCIN HCL 10 G IV SOLR
1500.0000 mg | Freq: Two times a day (BID) | INTRAVENOUS | Status: DC
  Administered 2016-12-17 – 2016-12-18 (×2): 1500 mg via INTRAVENOUS
  Filled 2016-12-17 (×4): qty 1500

## 2016-12-17 MED ORDER — INSULIN DETEMIR 100 UNIT/ML ~~LOC~~ SOLN
25.0000 [IU] | Freq: Every day | SUBCUTANEOUS | Status: DC
  Administered 2016-12-17 – 2016-12-18 (×2): 25 [IU] via SUBCUTANEOUS
  Filled 2016-12-17 (×3): qty 0.25

## 2016-12-17 MED ORDER — INSULIN ASPART 100 UNIT/ML ~~LOC~~ SOLN
5.0000 [IU] | Freq: Three times a day (TID) | SUBCUTANEOUS | Status: DC
  Administered 2016-12-17 – 2016-12-19 (×7): 5 [IU] via SUBCUTANEOUS

## 2016-12-17 MED ORDER — POLYETHYLENE GLYCOL 3350 17 G PO PACK
17.0000 g | PACK | Freq: Every day | ORAL | Status: DC
  Administered 2016-12-17 – 2016-12-19 (×4): 17 g via ORAL
  Filled 2016-12-17 (×4): qty 1

## 2016-12-17 MED ORDER — INSULIN ASPART 100 UNIT/ML ~~LOC~~ SOLN
0.0000 [IU] | Freq: Every day | SUBCUTANEOUS | Status: DC
  Administered 2016-12-17: 2 [IU] via SUBCUTANEOUS

## 2016-12-17 MED ORDER — INSULIN ASPART 100 UNIT/ML ~~LOC~~ SOLN
0.0000 [IU] | Freq: Three times a day (TID) | SUBCUTANEOUS | Status: DC
  Administered 2016-12-18: 2 [IU] via SUBCUTANEOUS
  Administered 2016-12-18 – 2016-12-19 (×3): 3 [IU] via SUBCUTANEOUS

## 2016-12-17 MED ORDER — HYDROCODONE-ACETAMINOPHEN 5-325 MG PO TABS
1.0000 | ORAL_TABLET | ORAL | Status: DC | PRN
  Administered 2016-12-17 – 2016-12-19 (×6): 2 via ORAL
  Filled 2016-12-17 (×6): qty 2

## 2016-12-17 NOTE — Addendum Note (Signed)
Addendum  created 12/17/16 0802 by Despina Hiddenobert J Garlin Batdorf, CRNA   Sign clinical note

## 2016-12-17 NOTE — Progress Notes (Signed)
PROGRESS NOTE    Rachel George  ZOX:[id number] DOB: [birth year] DOA: 12/13/2016 PCP: Jacquelin Hawking, PA-C    Brief Narrative:  36 year old female presented to the emergency room with chronic right lower leg swelling and wound on her right great toe for 3 months. X-ray showed evidence of osteomyelitis. She also had cellulitis of her right foot. She was admitted for intravenous antibiotics and surgical consult. She underwent amputation of right great toe on 1/15. Anticipate discharge home in 24-48 hours.   Assessment & Plan:   Principal Problem:   Osteomyelitis (HCC) Active Problems:   Uncontrolled type 2 diabetes mellitus with complication (HCC)   Morbid obesity (HCC)   Diabetic foot ulcer (HCC)   Nicotine dependence   1. Osteomyelitis of right great toe. On intravenous antibiotics (vancomycin and ceftriaxone). surrounding cellulitis improving. General surgery following. She underwent amputation of right great toe on 12/16/16. Cultures sent in OR. Anticipate discharge in the next 24-48 hours on oral antibiotics..  2. Right leg swelling. Likely related to infection. Venous Dopplers negative for DVT. Improving.   3. Diabetes. Oral medications currently on hold. Started on Levemir and sliding scale insulin. A1c 10.6. Blood sugars have been running high. Adjusted levemir and added meal coverage.  4. Elevated blood pressure. Patient does not report any history of hypertension. Blood pressure may be elevated related to pain. Continue to follow.  5. Morbid obesity. Counseled on the importance of diet and exercise. Patient is interested in pursuing bariatric surgery.   DVT prophylaxis: Heparin Code Status: Full code Family Communication: No family present Disposition Plan: Discharge home once improved, likely in next 24-48 hours   Consultants:   Gen. surgery  Procedures:     Antimicrobials:   Rocephin 1/12>>  Vancomycin 1/13>>   Subjective: Has pain in right foot. Feels  compression dressing is too tight  Objective: Vitals:   12/16/16 1515 12/16/16 1530 12/16/16 2212 12/17/16 0600  BP: (!) 142/64 (!) 142/68 (!) 125/42 (!) 110/44  Pulse: 78 78 83 79  Resp: 16 16 20 18   Temp:   98.2 F (36.8 C) 98.2 F (36.8 C)  TempSrc:   Oral Oral  SpO2: 96% 96% 100% 100%  Weight:      Height:       No intake or output data in the 24 hours ending 12/17/16 1425 Filed Weights   12/13/16 1449 12/13/16 2000 12/16/16 1153  Weight: (!) 154.2 kg (340 lb) (!) 154.2 kg (340 lb) (!) 154.2 kg (340 lb)    Examination:  General exam: Appears calm and comfortable  Respiratory system: Clear to auscultation. Respiratory effort normal. Cardiovascular system: S1 & S2 heard, RRR. No JVD, murmurs, rubs, gallops or clicks. No pedal edema. Gastrointestinal system: Abdomen is nondistended, soft and nontender. No organomegaly or masses felt. Normal bowel sounds heard. Central nervous system: Alert and oriented. No focal neurological deficits. Extremities: Symmetric 5 x 5 power. Right lower extremity swelling Skin: Right great toe with dressing in place.  Psychiatry: Judgement and insight appear normal. Mood & affect appropriate.     Data Reviewed: I have personally reviewed following labs and imaging studies  CBC:  Recent Labs Lab 12/13/16 1524 12/17/16 0725  WBC 9.3 8.1  NEUTROABS 3.7  --   HGB 12.7 11.5*  HCT 38.8 34.8*  MCV 79.5 80.2  PLT 269 237   Basic Metabolic Panel:  Recent Labs Lab 12/13/16 1524 12/15/16 0620 12/17/16 0725  NA 135 135 134*  K 4.4 4.0 3.9  CL 103 105  104  CO2 28 24 25   GLUCOSE 268* 170* 196*  BUN 10 10 10   CREATININE 0.74 0.74 0.74  CALCIUM 8.8* 8.4* 8.2*   GFR: Estimated Creatinine Clearance: 149.3 mL/min (by C-G formula based on SCr of 0.74 mg/dL). Liver Function Tests:  Recent Labs Lab 12/13/16 1524  AST 12*  ALT 11*  ALKPHOS 78  BILITOT 0.2*  PROT 7.0  ALBUMIN 3.1*   No results for input(s): LIPASE, AMYLASE in the  last 168 hours. No results for input(s): AMMONIA in the last 168 hours. Coagulation Profile: No results for input(s): INR, PROTIME in the last 168 hours. Cardiac Enzymes: No results for input(s): CKTOTAL, CKMB, CKMBINDEX, TROPONINI in the last 168 hours. BNP (last 3 results) No results for input(s): PROBNP in the last 8760 hours. HbA1C:  Recent Labs  12/15/16 0620  HGBA1C 10.6*   CBG:  Recent Labs Lab 12/16/16 1609 12/17/16 0215 12/17/16 0604 12/17/16 0728 12/17/16 1156  GLUCAP 148* 207* 200* 202* 170*   Lipid Profile: No results for input(s): CHOL, HDL, LDLCALC, TRIG, CHOLHDL, LDLDIRECT in the last 72 hours. Thyroid Function Tests: No results for input(s): TSH, T4TOTAL, FREET4, T3FREE, THYROIDAB in the last 72 hours. Anemia Panel: No results for input(s): VITAMINB12, FOLATE, FERRITIN, TIBC, IRON, RETICCTPCT in the last 72 hours. Sepsis Labs: No results for input(s): PROCALCITON, LATICACIDVEN in the last 168 hours.  Recent Results (from the past 240 hour(s))  Culture, blood (Routine X 2) w Reflex to ID Panel     Status: None (Preliminary result)   Collection Time: 12/13/16  3:26 PM  Result Value Ref Range Status   Specimen Description RIGHT ANTECUBITAL  Final   Special Requests BOTTLES DRAWN AEROBIC AND ANAEROBIC 6CC EACH  Final   Culture NO GROWTH 4 DAYS  Final   Report Status PENDING  Incomplete  Culture, blood (Routine X 2) w Reflex to ID Panel     Status: None (Preliminary result)   Collection Time: 12/13/16  3:37 PM  Result Value Ref Range Status   Specimen Description LEFT ANTECUBITAL  Final   Special Requests BOTTLES DRAWN AEROBIC AND ANAEROBIC 6CC EACH  Final   Culture NO GROWTH 4 DAYS  Final   Report Status PENDING  Incomplete  Aerobic/Anaerobic Culture (surgical/deep wound)     Status: None (Preliminary result)   Collection Time: 12/16/16  1:43 PM  Result Value Ref Range Status   Specimen Description   Final    TISSUE RIGHT 1ST TOE [facility]formed [facility]    Special Requests   Final    DEEP SURGICAL [facility]formed [facility]    Gram Stain   Final    RARE WBC PRESENT, PREDOMINANTLY PMN FEW GRAM POSITIVE COCCI IN PAIRS FEW GRAM VARIABLE ROD Performed [facility]    Culture PENDING  Incomplete   Report Status PENDING  Incomplete         Radiology Studies: No results found.      Scheduled Meds: . cefTRIAXone (ROCEPHIN)  IV  2 g Intravenous Q24H  . heparin  5,000 Units Subcutaneous Q8H  . insulin aspart  0-15 Units Subcutaneous TID WC & HS  . insulin detemir  20 Units Subcutaneous QHS  . pneumococcal 23 valent vaccine  0.5 mL Intramuscular Tomorrow-1000  . pravastatin  40 mg Oral q1800  . vancomycin  1,500 mg Intravenous Q12H   Continuous Infusions:    LOS: 4 days    Time spent: 25mins  Erick Blinks, MD [facility] Pager [phone]  If 7PM-7AM, please contact night-coverage [email or web address] Password [email or web address] 12/17/2016, 2:25 PM

## 2016-12-17 NOTE — Progress Notes (Signed)
Pharmacy Antibiotic Note  Dayna BarkerKaren Jensen is a 36 y.o. female admitted on 12/13/2016 with osteomyelitis.  Pharmacy has been consulted for Vancomycin dosing.  Trough level is above target.    Plan: Reduce Vancomycin to 1500mg  IV q12hrs Also on Ceftriaxone 2gm IV q24h F/U cxs and clinical progress Monitor V/S and labs, levels as indicated  Height: 5\' 6"  (167.6 cm) Weight: (!) 340 lb (154.2 kg) IBW/kg (Calculated) : 59.3  Temp (24hrs), Avg:98.1 F (36.7 C), Min:97.5 F (36.4 C), Max:98.4 F (36.9 C)   Recent Labs Lab 12/13/16 1524 12/15/16 0620 12/17/16 0725 12/17/16 0728  WBC 9.3  --  8.1  --   CREATININE 0.74 0.74 0.74  --   VANCOTROUGH  --   --   --  29*    Normalized CrCl 118 mls/min Estimated Creatinine Clearance: 149.3 mL/min (by C-G formula based on SCr of 0.74 mg/dL).    Allergies  Allergen Reactions  . Shellfish Allergy Anaphylaxis, Shortness Of Breath and Swelling    Facial Swelling  . Percocet [Oxycodone-Acetaminophen] Itching and Nausea And Vomiting   Antimicrobials this admission: Ceftriaxone 1/13>>  Vancomycin 1/13 >>   Pharmacokinetic dosing service  Weight: 154 Kilograms   Vancomycin single level analysis: Current dose being given: 1500 mg Current dosing interval:  8 hrs Current infusion time (hrs): 1.5  Single level Trough Data:  Trough level obtained: 29 mcg/ml Timing of trough - Number of hours since last dose:  7.5 Hrs Desired peak:  35 mcg/ml  Desired trough: 16 mcg/ml  Assessment: ==================== Estimated PK Parameters: --------------------------- New rate constant (kel): 0.052 hr-1 New half-life: 13.33 Hours New Vd from levels: 107.80  Liters  (0.7 L/kg)  Renal function is stable  Recommendations: ==================== Give Vancomycin  1500 mg  q 12 hrs. Infuse over 1.5 hrs Expected Ctrough: 16.7 mcg/ml  Recommended labs and intervals: Measure Bun and Scr 3 times/week.   Thank you for the consult, will continue to  follow.  Microbiology results: 1/13 BCx: pending  Valrie HartScott Railyn House, PharmD Clinical Pharmacist Pager:  [phone] 12/17/2016   12/17/2016 10:02 AM

## 2016-12-17 NOTE — Progress Notes (Signed)
SURGICAL PROGRESS NOTE  Hospital Day(s): 4.   Post op day(s): 1 Day Post-Op.   Interval History: Patient seen and examined, no acute events or new complaints overnight. Patient reports her pain has been well-controlled, denies fever/chills, CP, or SOB.  Review of Systems:  Constitutional: denies fever, chills  HEENT: denies cough or congestion  Respiratory: denies any shortness of breath  Cardiovascular: denies chest pain or palpitations  Gastrointestinal: denies abdominal pain, N/V, or diarrhea Genitourinary: denies burning with urination or urinary frequency Musculoskeletal: Right foot pain well-controlled as per interval history Integumentary: denies any other rashes or skin discolorations Neurological: denies HA or vision/hearing changes   Vital signs in last 24 hours: [min-max] current  Temp:  [97.5 F (36.4 C)-98.4 F (36.9 C)] 98.2 F (36.8 C) (01/16 0600) Pulse Rate:  [77-88] 79 (01/16 0600) Resp:  [11-24] 18 (01/16 0600) BP: (110-206)/(42-156) 110/44 (01/16 0600) SpO2:  [96 %-100 %] 100 % (01/16 0600) Weight:  [154.2 kg (340 lb)] 154.2 kg (340 lb) (01/15 1153)     Height: 5\' 6"  (167.6 cm) Weight: (!) 154.2 kg (340 lb) BMI (Calculated): 55   Intake/Output this shift:  No intake/output data recorded.   Intake/Output last 2 shifts:  @IOLAST2SHIFTS @   Physical Exam:  Constitutional: alert, cooperative and no distress  HENT: normocephalic without obvious abnormality  Eyes: PERRL, EOM's grossly intact and symmetric  Neuro: CN II - XII grossly intact and symmetric without deficit  Respiratory: breathing non-labored at rest  Cardiovascular: regular rate and sinus rhythm  Gastrointestinal: soft, non-tender, and non-distended  Musculoskeletal: UE and LE FROM, Right foot dressing c/d/i, NT   Labs:  CBC:  Lab Results  Component Value Date   WBC 8.1 12/17/2016   RBC 4.34 12/17/2016   BMP:  Lab Results  Component Value Date   GLUCOSE 196 (H) 12/17/2016   CO2 25  12/17/2016   BUN 10 12/17/2016   CREATININE 0.74 12/17/2016   CREATININE 0.67 09/12/2016   CALCIUM 8.2 (L) 12/17/2016     Imaging studies: No new pertinent imaging studies   Assessment/Plan: (ICD-10's: M86.271) 36 y.o. female doing overall well from surgical perspective 1 Day Post-Op s/p Right 1st toe amputation for subacute Right 1st toe osteomyelitis, complicated by pertinent comorbidities including morbid obesity (BMI 55), baseline poorly controlled diabetes mellitus, tobacco abuse, non-alcoholic fatty liver disease, and macular degeneration.   - antibiotics as per medical team  - will plan to change Right foot dressing tomorrow  - patient again expresses interest in bariatric weight loss program  - requests consultation with financial counselor to discuss insurance options   - importance of smoking cessation, blood glucose control again reiterated  - medical management of comorbidities per medical team  - minimize weight/pressure to Right 1st toe  - DVT prophylaxis  All of the above findings and recommendations were discussed with the patient, patient's family, and the medical team, and all of patient's and family's questions were answered to their expressed satisfaction.  -- Scherrie GerlachJason E. Earlene Plateravis, MD, RPVI [place]: [facility] Office: [phone]

## 2016-12-17 NOTE — Care Management Note (Addendum)
Case Management Note  Patient Details  Name: Rachel George MRN: [id number] Date of Birth: 06-30-1981  Subjective/Objective:   Patient s/p right great toe amputation. She is from home, ind with ADL's, currently does not have insurance. She will need MATCH voucher to cover antibiotics at time of discharge. FC consulted by CM for financial assistance. [facility] including help with cost of insulin. [facility]. She states she can dress wound herself if given proper discharge instructions.             Action/Plan: CM provided MATCH voucher. No other CM needs known.  12/19/2016 @1147 - [facility] notified of need for cane, patient does not have insurance and [facility] will have to see if patient qualifies for cane. Order placed for cane just in case.     Expected Discharge Date:       12/17/2016           Expected Discharge Plan:  Home/Self Care  In-House Referral:  NA  Discharge planning Services  CM Consult, MATCH Program  Post Acute Care Choice:    Choice offered to:     DME Arranged:    DME Agency:     HH Arranged:    HH Agency:     Status of Service:  Completed, signed off  If discussed at [employer] Meetings, dates discussed:    Additional Comments:  Camilah Spillman, Chrystine OilerSharley Diane, RN 12/17/2016, 12:35 PM

## 2016-12-17 NOTE — Progress Notes (Signed)
Lab called 12-17-16 @0730  to report critical lab vancomycin trough of 29, MD notified.

## 2016-12-17 NOTE — Progress Notes (Addendum)
Inpatient Diabetes Program Recommendations  AACE/ADA: New Consensus Statement on Inpatient Glycemic Control (2015)  Target Ranges:  Prepandial:   less than 140 mg/dL      Peak postprandial:   less than 180 mg/dL (1-2 hours)      Critically ill patients:  140 - 180 mg/dL   Results for Rachel George, Rachel George (MRN [id number]) as of 12/17/2016 08:49  Ref. Range 12/16/2016 07:26 12/16/2016 11:28 12/16/2016 12:06 12/16/2016 14:55 12/16/2016 16:09 12/17/2016 02:15 12/17/2016 06:04 12/17/2016 07:28  Glucose-Capillary Latest Ref Range: 65 - 99 mg/dL 841208 (H) 324159 (H) 401168 (H) 132 (H) 148 (H) 207 (H) 200 (H) 202 (H)  Results for Rachel George, Rachel George (MRN [id number]) as of 12/17/2016 08:49  Ref. Range 09/12/2016 10:22 12/15/2016 06:20  Hemoglobin A1C Latest Ref Range: 4.8 - 5.6 % 11.0 (H) 10.6 (H)   Review of Glycemic Control  Diabetes history: DM2 Outpatient Diabetes medications: Levemir 55 units QHS, Novolog 10 units TID with meals plus sliding scale, Metformin 500 mg BID, Januvia 100 mg daily Current orders for Inpatient glycemic control: Levemir 20 units QHS, Novolog 0-15 units ACHS  Inpatient Diabetes Program Recommendations: Insulin - Basal: Please consider increasing Levemir to 23 units QHS. Insulin - Meal Coverage: If patient is eating at least 50% of meals, please consider ordering Novolog 3 units TID with meals for meal coverage.  12/17/16@14 :20-Spoke with patient about diabetes and home regimen for diabetes control. Patient reports that she takes Levemir 55 units QHS, Novolog 10 units TID with meals plus sliding scale, Metformin 500 mg BID, Januvia 100 mg daily as an outpatient for diabetes control. Patient reports that she is taking DM medications as prescribed. Patient states that she was just started on Januvia about 1 month ago. Prior to starting Januvia patient states that her glucose was usually in the 200's mg/dl and since starting Januiva her glucose has improved to mid to upper 100's mg/dl most of the time.  Patient states that she checks her glucose 1-2 times per day. Discussed A1C results (10.6% on 12/15/16) and explained that her current A1C indicates an average glucose of 258 mg/dl over the past 2-3 months. Discussed glucose and A1C goals. Discussed importance of checking CBGs and maintaining good CBG control to prevent long-term and short-term complications. Explained how hyperglycemia leads to damage within blood vessels which lead to the common complications seen with uncontrolled diabetes. Stressed to the patient the importance of improving glycemic control to prevent further complications from uncontrolled diabetes especially to promote wound healing.  Encouraged patient to check her glucose 4 times per day (before meals and at bedtime) and to keep a log book of glucose readings and insulin taken which she will need to take to doctor appointments. Explained how her doctor can use the log book to continue to make adjustments with DM medications if needed. Patient verbalized understanding of information discussed and she states that she has no further questions at this time related to diabetes.  Thanks, Orlando PennerMarie Dameisha Tschida, RN, MSN, CDE Diabetes Coordinator Inpatient Diabetes Program [phone] (Team Pager from 8am to 5pm)

## 2016-12-17 NOTE — Anesthesia Postprocedure Evaluation (Signed)
Anesthesia Post Note  Patient: Rachel BarkerKaren George  Procedure(s) Performed: Procedure(s) (LRB): RIGHT FIRST TOE AMPUTATION (Right)  Patient location during evaluation: Nursing Unit Anesthesia Type: General Level of consciousness: awake, oriented and patient cooperative Pain management: pain level controlled Vital Signs Assessment: post-procedure vital signs reviewed and stable Respiratory status: spontaneous breathing, nonlabored ventilation and respiratory function stable Cardiovascular status: blood pressure returned to baseline Postop Assessment: no signs of nausea or vomiting Anesthetic complications: no     Last Vitals:  Vitals:   12/16/16 2212 12/17/16 0600  BP: (!) 125/42 (!) 110/44  Pulse: 83 79  Resp: 20 18  Temp: 36.8 C 36.8 C    Last Pain:  Vitals:   12/17/16 0600  TempSrc: Oral  PainSc:                  Ryatt Corsino J

## 2016-12-18 ENCOUNTER — Encounter (HOSPITAL_COMMUNITY): Payer: Self-pay | Admitting: Surgery

## 2016-12-18 DIAGNOSIS — F17208 Nicotine dependence, unspecified, with other nicotine-induced disorders: Secondary | ICD-10-CM

## 2016-12-18 LAB — GLUCOSE, CAPILLARY
GLUCOSE-CAPILLARY: 114 mg/dL — AB (ref 65–99)
GLUCOSE-CAPILLARY: 166 mg/dL — AB (ref 65–99)
Glucose-Capillary: 137 mg/dL — ABNORMAL HIGH (ref 65–99)
Glucose-Capillary: 174 mg/dL — ABNORMAL HIGH (ref 65–99)

## 2016-12-18 LAB — CULTURE, BLOOD (ROUTINE X 2)
CULTURE: NO GROWTH
Culture: NO GROWTH

## 2016-12-18 LAB — BASIC METABOLIC PANEL
ANION GAP: 4 — AB (ref 5–15)
BUN: 11 mg/dL (ref 6–20)
CALCIUM: 8.4 mg/dL — AB (ref 8.9–10.3)
CO2: 26 mmol/L (ref 22–32)
Chloride: 106 mmol/L (ref 101–111)
Creatinine, Ser: 0.71 mg/dL (ref 0.44–1.00)
Glucose, Bld: 127 mg/dL — ABNORMAL HIGH (ref 65–99)
Potassium: 3.9 mmol/L (ref 3.5–5.1)
Sodium: 136 mmol/L (ref 135–145)

## 2016-12-18 LAB — CBC
HCT: 36.3 % (ref 36.0–46.0)
HEMOGLOBIN: 11.3 g/dL — AB (ref 12.0–15.0)
MCH: 25.1 pg — ABNORMAL LOW (ref 26.0–34.0)
MCHC: 31.1 g/dL (ref 30.0–36.0)
MCV: 80.7 fL (ref 78.0–100.0)
Platelets: 250 10*3/uL (ref 150–400)
RBC: 4.5 MIL/uL (ref 3.87–5.11)
RDW: 14.9 % (ref 11.5–15.5)
WBC: 8.4 10*3/uL (ref 4.0–10.5)

## 2016-12-18 NOTE — Progress Notes (Addendum)
SURGICAL PROGRESS NOTE  Hospital Day(s): 5.   Post op day(s): 2 Days Post-Op.   Interval History: Patient seen and examined, no acute events or new complaints overnight. Patient reports her pain has been controlled, though she reports the sensation of Right 1st toe itching and pain despite knowing her Right 1st toe is no longer there, denies fever/chills, CP, or SOB.  Review of Systems:  Constitutional: denies fever, chills  HEENT: denies cough or congestion  Respiratory: denies any shortness of breath  Cardiovascular: denies chest pain or palpitations  Gastrointestinal: denies abdominal pain, N/V, or diarrhea Genitourinary: denies burning with urination or urinary frequency Musculoskeletal: denies pain, decreased motor or sensation Integumentary: denies any rashes or wounds except Right 1st toe amputation site Neurological: denies HA or vision/hearing changes   Vital signs in last 24 hours: [min-max] current  Temp:  [98.1 F (36.7 C)-98.2 F (36.8 C)] 98.2 F (36.8 C) (01/17 0435) Pulse Rate:  [79-80] 80 (01/17 0435) Resp:  [18-20] 18 (01/17 0435) BP: (112-131)/(44-87) 112/44 (01/17 0435) SpO2:  [96 %-100 %] 96 % (01/17 0435)     Height: 5\' 6"  (167.6 cm) Weight: (!) 154.2 kg (340 lb) BMI (Calculated): 55   Intake/Output this shift:  No intake/output data recorded.   Intake/Output last 2 shifts:  @IOLAST2SHIFTS @   Physical Exam:  Constitutional: alert, cooperative and no distress  HENT: normocephalic without obvious abnormality  Eyes: PERRL, EOM's grossly intact and symmetric  Neuro: CN II - XII grossly intact and symmetric without deficit  Respiratory: breathing non-labored at rest  Cardiovascular: regular rate and sinus rhythm  Gastrointestinal: soft, non-tender, and non-distended  Musculoskeletal: UE and LE FROM, small amount of blood at Right 1st toe amputation site without active bleeding, incision well-approximated without erythema or drainage, mild residual Right  medial foot non-pitting edema, motor and sensation grossly intact, minimally tender  Pulse/Doppler Exam: (p=palpable; d=doppler signals; 0=none)     Right   Left   Fem  p   p   DP  p   p   Labs:  CBC:  Lab Results  Component Value Date   WBC 8.4 12/18/2016   RBC 4.50 12/18/2016   BMP:  Lab Results  Component Value Date   GLUCOSE 127 (H) 12/18/2016   CO2 26 12/18/2016   BUN 11 12/18/2016   CREATININE 0.71 12/18/2016   CREATININE 0.67 09/12/2016   CALCIUM 8.4 (L) 12/18/2016     Imaging studies: No new pertinent imaging studies   Assessment/Plan: (ICD-10's: M86.271) 36 y.o. female doing overall well from surgical perspective 1 Day Post-Op s/p Right 1st toe amputation for subacute Right 1st toe osteomyelitis, complicated by pertinent comorbidities including morbid obesity (BMI 55), baseline poorly controlled diabetes mellitus, tobacco abuse, non-alcoholic fatty liver disease, and macular degeneration.              - complete 2 weeks PO doxycycline             - physical therapy for Right foot post-op pressure offloading  - dressing changed, instructed patient to keep dressing x3 days             - patient again expresses interest in bariatric weight loss program             - consultation with financial counselor to discuss insurance options appreciated             - importance of smoking cessation, blood glucose control again reiterated  - okay with discharge planning per  medicine as weather permits             - medical management of comorbidities per medical team             - minimize weight/pressure to Right 1st toe             - DVT prophylaxis  All of the above findings and recommendations were discussed with the patient, patient's family, and the medical team, and all of patient's and family's questions were answered to their expressed satisfaction.  -- Scherrie Gerlach Earlene Plater, MD, RPVI [place]: [facility]  Care Office: [phone]

## 2016-12-18 NOTE — Evaluation (Signed)
Physical Therapy Evaluation Patient Details Name: Rachel BarkerKaren George MRN: [id number] DOB: 1981/09/24 Today's Date: 12/18/2016   History of Present Illness  36 y.o. female with hx of morbid obesity, DM, chronic right great toe ulcer for the past 3 months, chronic right lower leg swelling for 3 months, cared for at the wound care, presented to the ER with increase pain of her right foot. She has had no fever, chills, or any discharge from her wound.  Evaluation in the ER showed normal WBC, no fever, and Cr of 0.74.  Xray of the foot showed evidence of osteomyelitis, and she was started on IV Levoquin, and hospitalist was asked to admit her for further Tx.   She is now s/p R great toe amputation on 12/17/2015  Clinical Impression  Pt received in bed, and was agreeable to PT evaluation.  PT expressed that she requires some assistance for getting up and down the steps into the house, as well as assistance for sit<>stand.  She is independent with ADL's.  She ambulated 6580ft with and without straight cane, with R post op shoe donned and weight bearing through heel.  At this point she does not demonstrate need for skilled PT, and is recommended to d/c home.  She would benefit from a cane upon d/c.      Follow Up Recommendations No PT follow up    Equipment Recommendations  Other (comment) Gilmer Mor(Cane)    Recommendations for Other Services       Precautions / Restrictions Precautions Precautions: Fall Precaution Comments: 1 fall - fell getting out of the shower and hurt her foot and leg and it hasn't healed for the last 3 months.  Required Braces or Orthoses: Other Brace/Splint Other Brace/Splint: R post op shoe Restrictions Weight Bearing Restrictions: Yes RLE Weight Bearing: Partial weight bearing (Post Op shoe) RLE Partial Weight Bearing Percentage or Pounds: heel WB      Mobility  Bed Mobility Overal bed mobility: Modified Independent                Transfers Overall transfer level:  Modified independent                  Ambulation/Gait Ambulation/Gait assistance: Supervision;Modified independent (Device/Increase time) Ambulation Distance (Feet): 80 Feet Assistive device: None;Straight cane Gait Pattern/deviations: Step-through pattern     General Gait Details: R post op shoe donned and WB through heel  Stairs            Wheelchair Mobility    Modified Rankin (Stroke Patients Only)       Balance Overall balance assessment: History of Falls;Modified Independent                                           Pertinent Vitals/Pain Pain Assessment: 0-10 Pain Score: 5  Pain Location: R foot Pain Descriptors / Indicators: Throbbing Pain Intervention(s): Limited activity within patient's tolerance;Monitored during session;Repositioned    Home Living   Living Arrangements: Spouse/significant other Available Help at Discharge: Available 24 hours/day Type of Home: House Home Access: Stairs to enter   [employer] Corporation[employer]trance Stairs-Number of Steps: 3 with no HR Home Layout: One level Home Equipment: None      Prior Function Level of Independence: Needs assistance   Gait / Transfers Assistance Needed: husband assists getting up the steps and transfer sit<>stand out of the chair, but ambulated independently.   ADL's /  Homemaking Assistance Needed: independent with dressing and bathing, not driving.  Husband and parents help with running errands and getting to the grocery store.          Hand Dominance   Dominant Hand: Right    Extremity/Trunk Assessment   Upper Extremity Assessment Upper Extremity Assessment: Overall WFL for tasks assessed    Lower Extremity Assessment Lower Extremity Assessment: Overall WFL for tasks assessed;RLE deficits/detail RLE Deficits / Details: R foot not assessed due to bandage       Communication   Communication: No difficulties  Cognition Arousal/Alertness: Awake/alert Behavior During Therapy: WFL  for tasks assessed/performed Overall Cognitive Status: Within Functional Limits for tasks assessed                      General Comments      Exercises     Assessment/Plan    PT Assessment Patent does not need any further PT services  PT Problem List            PT Treatment Interventions      PT Goals (Current goals can be found in the Care Plan section)  Acute Rehab PT Goals PT Goal Formulation: All assessment and education complete, DC therapy    Frequency     Barriers to discharge        Co-evaluation               End of Session Equipment Utilized During Treatment: Gait belt Activity Tolerance: Patient tolerated treatment well Patient left: in chair;with call bell/phone within reach Nurse Communication: Mobility status Lowanda Foster, RN notified and mobiltiy sheet hung up in the room. )         Time: 1610-9604 PT Time Calculation (min) (ACUTE ONLY): 23 min   Charges:   PT Evaluation $PT Eval Low Complexity: 1 Procedure PT Treatments $Gait Training: 8-22 mins   PT G Codes:        Beth Graysin Luczynski, PT, DPT X: [phone]

## 2016-12-18 NOTE — Progress Notes (Signed)
PROGRESS NOTE    Rachel George  JWJ:[id number] DOB: June 05, 1981 DOA: 12/13/2016 PCP: Jacquelin Hawking, PA-C    Brief Narrative:  36 year old female presented to the emergency room with chronic right lower leg swelling and wound on her right great toe for 3 months. X-ray showed evidence of osteomyelitis. She also had cellulitis of her right foot. She was admitted for intravenous antibiotics and surgical consult. She underwent amputation of right great toe on 1/15. US showed no DVT.  She is ready for discharge, however, there is inclement weather today in the state of [place] and the Governor had recommended non emergency travel today.    Assessment & Plan:   Principal Problem:   Osteomyelitis (HCC) Active Problems:   Uncontrolled type 2 diabetes mellitus with complication (HCC)   Morbid obesity (HCC)   Diabetic foot ulcer (HCC)   Nicotine dependence  1. Osteomyelitis of right great toe. On intravenous antibiotics Rocephin. Plan d/c on 2 weeks of Doxy. improving. General surgery following and plans on amputation of great toe today.  2. Right leg swelling. Likely related to infection. Venous Dopplers negative for DVT  3. Diabetes. Oral medications currently on hold. Started on Levemir and sliding scale insulin. A1c in process. Blood sugars have been stable.  4. Elevated blood pressure. Patient does not report any history of hypertension. Blood pressure may be elevated related to pain. Continue to follow.  5. Morbid obesity. Counseled on the importance of diet and exercise. Patient is interested in pursuing bariatric surgery.   DVT prophylaxis: Heparin SQ.  Code Status: FULL CODE. Family Communication: Husband at bedside.  Disposition Plan: To home when appropriate.  Consults called: None.  Admission status: OBS.   Antimicrobials: Anti-infectives    Start     Dose/Rate Route Frequency Ordered Stop   12/17/16 1800  vancomycin (VANCOCIN) 1,500 mg in sodium chloride 0.9 % 500 mL  IVPB     1,500 mg 250 mL/hr over 120 Minutes Intravenous Every 12 hours 12/17/16 1001     12/15/16 0000  vancomycin (VANCOCIN) 1,500 mg in sodium chloride 0.9 % 500 mL IVPB  Status:  Discontinued     1,500 mg 250 mL/hr over 120 Minutes Intravenous Every 8 hours 12/14/16 1740 12/17/16 0820   12/14/16 1900  vancomycin (VANCOCIN) IVPB 1000 mg/200 mL premix     1,000 mg 200 mL/hr over 60 Minutes Intravenous  Once 12/14/16 1740 12/14/16 2110   12/14/16 1800  cefTRIAXone (ROCEPHIN) 2 g in dextrose 5 % 50 mL IVPB     2 g 100 mL/hr over 30 Minutes Intravenous Every 24 hours 12/14/16 1731     12/14/16 1800  vancomycin (VANCOCIN) IVPB 1000 mg/200 mL premix     1,000 mg 200 mL/hr over 60 Minutes Intravenous  Once 12/14/16 1740 12/14/16 1928   12/14/16 0000  cefTRIAXone (ROCEPHIN) 2 g in dextrose 5 % 50 mL IVPB  Status:  Discontinued     2 g 100 mL/hr over 30 Minutes Intravenous Every 24 hours 12/13/16 1920 12/14/16 1731   12/13/16 1630  levofloxacin (LEVAQUIN) IVPB 750 mg     750 mg 100 mL/hr over 90 Minutes Intravenous  Once 12/13/16 1626 12/13/16 1807       Subjective:  Feeling better.   Objective: Vitals:   12/16/16 2212 12/17/16 0600 12/17/16 2128 12/18/16 0435  BP: (!) 125/42 (!) 110/44 131/87 (!) 112/44  Pulse: 83 79 79 80  Resp: 20 18 20 18   Temp: 98.2 F (36.8 C) 98.2 F (36.8 C)  98.1 F (36.7 C) 98.2 F (36.8 C)  TempSrc: Oral Oral Oral Oral  SpO2: 100% 100% 100% 96%  Weight:      Height:        Intake/Output Summary (Last 24 hours) at 12/18/16 1508 Last data filed at 12/18/16 0500  Gross per 24 hour  Intake              740 ml  Output                0 ml  Net              740 ml   Filed Weights   12/13/16 1449 12/13/16 2000 12/16/16 1153  Weight: (!) 154.2 kg (340 lb) (!) 154.2 kg (340 lb) (!) 154.2 kg (340 lb)    Examination:  General exam: Appears calm and comfortable  Respiratory system: Clear to auscultation. Respiratory effort normal. Cardiovascular  system: S1 & S2 heard, RRR. No JVD, murmurs, rubs, gallops or clicks. No pedal edema. Gastrointestinal system: Abdomen is nondistended, soft and nontender. No organomegaly or masses felt. Normal bowel sounds heard. Central nervous system: Alert and oriented. No focal neurological deficits. Extremities: Symmetric 5 x 5 power. Skin: No rashes, lesions or ulcers.  Wound is clean and dry.  Psychiatry: Judgement and insight appear normal. Mood & affect appropriate.   Data Reviewed: I have personally reviewed following labs and imaging studies  CBC:  Recent Labs Lab 12/13/16 1524 12/17/16 0725 12/18/16 0539  WBC 9.3 8.1 8.4  NEUTROABS 3.7  --   --   HGB 12.7 11.5* 11.3*  HCT 38.8 34.8* 36.3  MCV 79.5 80.2 80.7  PLT 269 237 250   Basic Metabolic Panel:  Recent Labs Lab 12/13/16 1524 12/15/16 0620 12/17/16 0725 12/18/16 0539  NA 135 135 134* 136  K 4.4 4.0 3.9 3.9  CL 103 105 104 106  CO2 28 24 25 26   GLUCOSE 268* 170* 196* 127*  BUN 10 10 10 11   CREATININE 0.74 0.74 0.74 0.71  CALCIUM 8.8* 8.4* 8.2* 8.4*   GFR: Estimated Creatinine Clearance: 149.3 mL/min (by C-G formula based on SCr of 0.71 mg/dL). Liver Function Tests:  Recent Labs Lab 12/13/16 1524  AST 12*  ALT 11*  ALKPHOS 78  BILITOT 0.2*  PROT 7.0  ALBUMIN 3.1*   CBG:  Recent Labs Lab 12/17/16 1633 12/17/16 1955 12/18/16 0001 12/18/16 0732 12/18/16 1143  GLUCAP 273* 214* 126* 114* 166*    Recent Results (from the past 240 hour(s))  Culture, blood (Routine X 2) w Reflex to ID Panel     Status: None   Collection Time: 12/13/16  3:26 PM  Result Value Ref Range Status   Specimen Description RIGHT ANTECUBITAL  Final   Special Requests BOTTLES DRAWN AEROBIC AND ANAEROBIC [facility] EACH  Final   Culture NO GROWTH 5 DAYS  Final   Report Status 12/18/2016 FINAL  Final  Culture, blood (Routine X 2) w Reflex to ID Panel     Status: None   Collection Time: 12/13/16  3:37 PM  Result Value Ref Range Status    Specimen Description LEFT ANTECUBITAL  Final   Special Requests BOTTLES DRAWN AEROBIC AND ANAEROBIC [facility] EACH  Final   Culture NO GROWTH 5 DAYS  Final   Report Status 12/18/2016 FINAL  Final  Aerobic/Anaerobic Culture (surgical/deep wound)     Status: None (Preliminary result)   Collection Time: 12/16/16  1:43 PM  Result Value Ref Range Status  Specimen Description   Final    TISSUE RIGHT 1ST TOE Performed at [facility], [address] [place], [place] [address]    Special Requests   Final    DEEP SURGICAL Performed at [facility], [address] [place], [place] [address]    Gram Stain   Final    RARE WBC PRESENT, PREDOMINANTLY PMN FEW GRAM POSITIVE COCCI IN PAIRS FEW GRAM VARIABLE ROD    Culture   Final    RARE STAPHYLOCOCCUS AUREUS SUSCEPTIBILITIES TO FOLLOW Performed at [facility], [address] [place], [place] [address]    Report Status PENDING  Incomplete     Radiology Studies: No results found.  Scheduled Meds: . cefTRIAXone (ROCEPHIN)  IV  2 g Intravenous Q24H  . heparin  5,000 Units Subcutaneous Q8H  . insulin aspart  0-15 Units Subcutaneous TID WC  . insulin aspart  0-5 Units Subcutaneous QHS  . insulin aspart  5 Units Subcutaneous TID WC  . insulin detemir  25 Units Subcutaneous QHS  . pneumococcal 23 valent vaccine  0.5 mL Intramuscular Tomorrow-1000  . polyethylene glycol  17 g Oral Daily  . pravastatin  40 mg Oral q1800  . vancomycin  1,500 mg Intravenous Q12H   Continuous Infusions:   LOS: 5 days   Karis Emig, MD FACP Hospitalist.   If 7PM-7AM, please contact night-coverage [email or web address] Password [email or web address] 12/18/2016, 3:08 PM

## 2016-12-18 NOTE — Discharge Instructions (Signed)
In addition to included general post-operative instructions for Toe Amputation,  Diet: Resume home heart healthy Diabetic diet.   Activity: Minimize weight and pressure to Right 1st toe amputation site until follow-up evaluation/appointment, but light activity and walking are encouraged. Do not drive or drink alcohol if taking narcotic pain medications. Open toe footwear advised.  Wound care: Remove dressing in 3 days (Saturday, 1/20). Once dressing removed, may shower/get incision wet with water/soapy water and pat dry (do not rub incisions), but no baths or submerging incision underwater until follow-up.   Medications: Resume all home medications with strict blood glucose monitoring and control. For mild to moderate pain: acetaminophen (Tylenol) or ibuprofen (if no kidney disease). Narcotic pain medications, if prescribed, can be used for severe pain, though may cause nausea, constipation, and drowsiness. Do not combine Tylenol and Percocet within a 6 hour period as Percocet contains Tylenol. If you do not need the narcotic pain medication, you do not need to fill the prescription.  Call office [phone]) at any time if any questions, worsening pain, fevers/chills, bleeding, drainage from incision site, or other concerns.

## 2016-12-19 LAB — GLUCOSE, CAPILLARY
GLUCOSE-CAPILLARY: 181 mg/dL — AB (ref 65–99)
Glucose-Capillary: 158 mg/dL — ABNORMAL HIGH (ref 65–99)

## 2016-12-19 MED ORDER — DOXYCYCLINE HYCLATE 50 MG PO CAPS: 50.0000 mg | ORAL_CAPSULE | Freq: Two times a day (BID) | ORAL | 0 refills | Status: DC

## 2016-12-19 NOTE — Discharge Summary (Signed)
Physician Discharge Summary  Rachel George VFI:[id number] DOB: 05/16/1981 DOA: 12/13/2016  PCP: Jacquelin Hawking, PA-C  Admit date: 12/13/2016 Discharge date: 12/19/2016  Admitted From: Home.  Disposition: Home.   Recommendations for Outpatient Follow-up:  1. Follow up with PCP in 1-2 weeks 2. Follow up with Dr Earlene Plater as set up by him.   Home Health: None needed.  Equipment/Devices: None.  Discharge Condition: Improved.  Minimal pain and well healing surgery.  CODE STATUS: FULL CODE.  Diet recommendation: Carb modified.   Brief/Interim Summary:  Patient was admitted by me on Dec 13, 2016 for osteomyelitis.   As per my H and P:  " Rachel George is an 36 y.o. female with hx of morbid obesity, DM, chronic right great toe ulcer for the past 3 months, chronic right lower leg swelling for 3 months, cared for at the wound care, presented to the ER with increase pain of her right foot. She has had no fever, chills, or any discharge from her wound.  Evaluation in the ER showed normal WBC, no fever, and Cr of 0.74.  Xray of the foot showed evidence of osteomyelitis, and she was started on IV Levoquin, and hospitalist was asked to admit her for further Tx.     Hospital course:  Patient was admitted into the hospital, and she was started on IV Rocephin.  Surgery consultation with Dr Earlene Plater was requested, and he took her to the OR for a toe amputation.  The surgical pathology showed acute osteomyelitis, and the margin was clear.  Post op wound was healing well.  Her leg [place] was negative, and she was recommended another 2 weeks of Doxycycline.  She will follow up with her PCP and Dr Earlene Plater as arranged.  She is also interested in Bariatric surgery, and will follow up outpatient.  Thank you for allowing me to participate in her care.  Good Day.   Discharge Diagnoses:  Principal Problem:   Osteomyelitis (HCC) Active Problems:   Uncontrolled type 2 diabetes mellitus with complication (HCC)   Morbid obesity  (HCC)   Diabetic foot ulcer (HCC)   Nicotine dependence    Discharge Instructions  Discharge Instructions    Diet - low sodium heart healthy    Complete by:  As directed    Increase activity slowly    Complete by:  As directed      Allergies as of 12/19/2016      Reactions   Shellfish Allergy Anaphylaxis, Shortness Of Breath, Swelling   Facial Swelling   Percocet [oxycodone-acetaminophen] Itching, Nausea And Vomiting      Medication List    TAKE these medications   albuterol 108 (90 Base) MCG/ACT inhaler Commonly known as:  PROVENTIL HFA;VENTOLIN HFA Inhale 2 puffs into the lungs every 6 (six) hours as needed for wheezing or shortness of breath.   benzonatate 100 MG capsule Commonly known as:  TESSALON PERLES 1- 2 capsules po q 8 hour prn cough What changed:  how much to take  how to take this  when to take this  reasons to take this  additional instructions   diclofenac 75 MG EC tablet Commonly known as:  VOLTAREN Take 1 tablet (75 mg total) by mouth 2 (two) times daily as needed.   doxycycline 50 MG capsule Commonly known as:  VIBRAMYCIN Take 1 capsule (50 mg total) by mouth 2 (two) times daily.   insulin aspart 100 UNIT/ML injection Commonly known as:  novoLOG Inject 10 Units into the skin 3 (three)  times daily before meals. 10 units plus sliding scales.   insulin detemir 100 UNIT/ML injection Commonly known as:  LEVEMIR Inject 55 Units into the skin at bedtime. Reported on 04/16/2016   lovastatin 20 MG tablet Commonly known as:  MEVACOR Take 1 tablet (20 mg total) by mouth at bedtime.   metFORMIN 500 MG tablet Commonly known as:  GLUCOPHAGE Take 1 tablet (500 mg total) by mouth 2 (two) times daily with a meal.   sitaGLIPtin 100 MG tablet Commonly known as:  JANUVIA Take 1 tablet (100 mg total) by mouth daily.   tetrahydrozoline-zinc 0.05-0.25 % ophthalmic solution Commonly known as:  VISINE-AC Place 2 drops into both eyes 3 (three) times  daily as needed.   traMADol 50 MG tablet Commonly known as:  ULTRAM Take 1 tablet (50 mg total) by mouth every 6 (six) hours as needed.            Durable Medical Equipment        Start     Ordered   12/19/16 1150  For home use only DME Cane  Once     12/19/16 1149     Follow-up Information    Jacolyn Reedy, PA-C On 12/25/2016.   Specialty:  Cardiology Why:  1:40 pm Contact information: [address] [place] [phone]          Allergies  Allergen Reactions  . Shellfish Allergy Anaphylaxis, Shortness Of Breath and Swelling    Facial Swelling  . Percocet [Oxycodone-Acetaminophen] Itching and Nausea And Vomiting    Consultations:  Surgery  Dr Earlene Plater.    Procedures/Studies: Dg Chest 2 View  Result Date: 11/20/2016 CLINICAL DATA:  Short of breath and chest pain. EXAM: CHEST  2 VIEW COMPARISON:  06/04/2016 FINDINGS: Normal heart size. Lungs clear. No pneumothorax. No pleural effusion. IMPRESSION: No active cardiopulmonary disease. Electronically Signed   By: Jolaine Click M.D.   On: 11/20/2016 11:46   US Venous Img Lower Unilateral Right  Result Date: 12/15/2016 CLINICAL DATA:  Right lower extremity pain and edema for the past several days. History of fall onto the right lower extremity while in the shower. History of smoking. Evaluate for DVT. EXAM: RIGHT LOWER EXTREMITY VENOUS DOPPLER ULTRASOUND TECHNIQUE: Gray-scale sonography with graded compression, as well as color Doppler and duplex ultrasound were performed to evaluate the lower extremity deep venous systems from the level of the common femoral vein and including the common femoral, femoral, profunda femoral, popliteal and calf veins including the posterior tibial, peroneal and gastrocnemius veins when visible. The superficial great saphenous vein was also interrogated. Spectral Doppler was utilized to evaluate flow at rest and with distal augmentation maneuvers in the common femoral, femoral and  popliteal veins. COMPARISON:  None. FINDINGS: Contralateral Common Femoral Vein: Respiratory phasicity is normal and symmetric with the symptomatic side. No evidence of thrombus. Normal compressibility. Common Femoral Vein: No evidence of thrombus. Normal compressibility, respiratory phasicity and response to augmentation. Saphenofemoral Junction: No evidence of thrombus. Normal compressibility and flow on color Doppler imaging. Profunda Femoral Vein: No evidence of thrombus. Normal compressibility and flow on color Doppler imaging. Femoral Vein: No evidence of thrombus. Normal compressibility, respiratory phasicity and response to augmentation. Popliteal Vein: No evidence of thrombus. Normal compressibility, respiratory phasicity and response to augmentation. Calf Veins: No evidence of thrombus. Normal compressibility and flow on color Doppler imaging. Superficial Great Saphenous Vein: No evidence of thrombus. Normal compressibility and flow on color Doppler imaging. Venous Reflux:  None. Other  Findings: A moderate amount of subcutaneous edema is noted at the level the right calf (image 39 and images 52 through 56). IMPRESSION: 1. No evidence of DVT within the right lower extremity. Electronically Signed   By: Simonne Come M.D.   On: 12/15/2016 15:00   US Arterial Seg Multiple  Result Date: 11/21/2016 CLINICAL DATA:  Right great toe nonhealing wound, cellulitis for 2 months. EXAM: NONINVASIVE PHYSIOLOGIC VASCULAR STUDY OF BILATERAL LOWER EXTREMITIES TECHNIQUE: Evaluation of both lower extremities was performed at rest, including calculation of ankle-brachial indices, multiple segmental pressure evaluation, segmental Doppler and segmental pulse volume recording. COMPARISON:  None. FINDINGS: Right ABI:  1.22 Left ABI:  1.36 Right Lower Extremity: Biphasic right femoral and superficial femoral waveforms and flow indicative of mild right ilio femoral vascular disease without occlusion. Triphasic right popliteal and  posterior tibial waveforms and biphasic right dorsalis pedis waveform. No significant occlusive vascular process detected at rest. Symmetric pulse volume recordings. Right toe pressure not obtainable. Left Lower Extremity: Biphasic left femoral, superficial femoral, popliteal, posterior tibial and dorsalis pedis waveforms. Symmetric pulse volume recordings. No significant occlusive process. IMPRESSION: Preserved ABIs at rest bilaterally with evidence of nonocclusive peripheral vascular disease. Electronically Signed   By: Judie Petit.  Shick M.D.   On: 11/21/2016 11:59   Dg Foot Complete Right  Result Date: 12/13/2016 CLINICAL DATA:  Diabetic patient with a chronic wound on the right great toe. EXAM: RIGHT FOOT COMPLETE - 3+ VIEW COMPARISON:  Plain films the right foot 09/04/2016. FINDINGS: Large soft tissue wound is seen at the distal aspect of the great toe and soft tissues are markedly swollen about the toe and throughout the foot. There is bony destructive change of the tuft of the great toe consistent with osteomyelitis. No fracture or dislocation. No soft tissue gas. IMPRESSION: Findings consistent with osteomyelitis in the distal phalanx of the great toe. Large soft tissue wound in the distal phalanx of the great toe and marked soft tissue swelling about the foot diffusely. Electronically Signed   By: Drusilla Kanner M.D.   On: 12/13/2016 15:19       Subjective:  Doing better.    Discharge Exam: Vitals:   12/18/16 2129 12/19/16 0552  BP: (!) 131/56 133/73  Pulse: 76 78  Resp: 20 20  Temp: 98 F (36.7 C) 98.1 F (36.7 C)   Vitals:   12/18/16 0435 12/18/16 1500 12/18/16 2129 12/19/16 0552  BP: (!) 112/44 127/64 (!) 131/56 133/73  Pulse: 80 77 76 78  Resp: 18 18 20 20   Temp: 98.2 F (36.8 C) 98.1 F (36.7 C) 98 F (36.7 C) 98.1 F (36.7 C)  TempSrc: Oral Oral Oral Oral  SpO2: 96% 100% 99% 96%  Weight:      Height:        General: Pt is alert, awake, not in acute  distress Cardiovascular: RRR, S1/S2 +, no rubs, no gallops Respiratory: CTA bilaterally, no wheezing, no rhonchi Abdominal: Soft, NT, ND, bowel sounds + Extremities: no edema, no cyanosis    The results of significant diagnostics from this hospitalization (including imaging, microbiology, ancillary and laboratory) are listed below for reference.     Microbiology: Recent Results (from the past 240 hour(s))  Culture, blood (Routine X 2) w Reflex to ID Panel     Status: None   Collection Time: 12/13/16  3:26 PM  Result Value Ref Range Status   Specimen Description RIGHT ANTECUBITAL  Final   Special Requests BOTTLES DRAWN AEROBIC AND ANAEROBIC 6CC  EACH  Final   Culture NO GROWTH 5 DAYS  Final   Report Status 12/18/2016 FINAL  Final  Culture, blood (Routine X 2) w Reflex to ID Panel     Status: None   Collection Time: 12/13/16  3:37 PM  Result Value Ref Range Status   Specimen Description LEFT ANTECUBITAL  Final   Special Requests BOTTLES DRAWN AEROBIC AND ANAEROBIC [facility] EACH  Final   Culture NO GROWTH 5 DAYS  Final   Report Status 12/18/2016 FINAL  Final  Aerobic/Anaerobic Culture (surgical/deep wound)     Status: None (Preliminary result)   Collection Time: 12/16/16  1:43 PM  Result Value Ref Range Status   Specimen Description   Final    TISSUE RIGHT 1ST TOE Performed at [facility], [address] [place], [place] [address]    Special Requests   Final    DEEP SURGICAL Performed at [facility], [address] [place], [place] [address]    Gram Stain   Final    RARE WBC PRESENT, PREDOMINANTLY PMN FEW GRAM POSITIVE COCCI IN PAIRS FEW GRAM VARIABLE ROD    Culture   Final    RARE STAPHYLOCOCCUS AUREUS HOLDING FOR POSSIBLE ANAEROBE Performed at [facility], [address] [place], [place] [address]    Report Status PENDING  Incomplete   Organism ID, Bacteria STAPHYLOCOCCUS AUREUS  Final      Susceptibility   Staphylococcus  aureus - MIC*    CIPROFLOXACIN <=0.5 SENSITIVE Sensitive     ERYTHROMYCIN <=0.25 SENSITIVE Sensitive     GENTAMICIN <=0.5 SENSITIVE Sensitive     OXACILLIN <=0.25 SENSITIVE Sensitive     TETRACYCLINE <=1 SENSITIVE Sensitive     VANCOMYCIN <=0.5 SENSITIVE Sensitive     TRIMETH/SULFA <=10 SENSITIVE Sensitive     CLINDAMYCIN <=0.25 SENSITIVE Sensitive     RIFAMPIN <=0.5 SENSITIVE Sensitive     Inducible Clindamycin NEGATIVE Sensitive     * RARE STAPHYLOCOCCUS AUREUS     Labs: BNP (last 3 results) No results for input(s): BNP in the last 8760 hours. Basic Metabolic Panel:  Recent Labs Lab 12/13/16 1524 12/15/16 0620 12/17/16 0725 12/18/16 0539  NA 135 135 134* 136  K 4.4 4.0 3.9 3.9  CL 103 105 104 106  CO2 28 24 25 26   GLUCOSE 268* 170* 196* 127*  BUN 10 10 10 11   CREATININE 0.74 0.74 0.74 0.71  CALCIUM 8.8* 8.4* 8.2* 8.4*   Liver Function Tests:  Recent Labs Lab 12/13/16 1524  AST 12*  ALT 11*  ALKPHOS 78  BILITOT 0.2*  PROT 7.0  ALBUMIN 3.1*   No results for input(s): LIPASE, AMYLASE in the last 168 hours. No results for input(s): AMMONIA in the last 168 hours. CBC:  Recent Labs Lab 12/13/16 1524 12/17/16 0725 12/18/16 0539  WBC 9.3 8.1 8.4  NEUTROABS 3.7  --   --   HGB 12.7 11.5* 11.3*  HCT 38.8 34.8* 36.3  MCV 79.5 80.2 80.7  PLT 269 237 250   Cardiac Enzymes: No results for input(s): CKTOTAL, CKMB, CKMBINDEX, TROPONINI in the last 168 hours. BNP: Invalid input(s): POCBNP CBG:  Recent Labs Lab 12/18/16 1143 12/18/16 1554 12/18/16 2015 12/19/16 0804 12/19/16 1200  GLUCAP 166* 137* 174* 181* 158*     Urinalysis    Component Value Date/Time   COLORURINE YELLOW 08/06/2016 1417   APPEARANCEUR CLEAR 08/06/2016 1417   LABSPEC 1.020 08/06/2016 1417   PHURINE 6.0 08/06/2016 1417  GLUCOSEU NEGATIVE 08/06/2016 1417   HGBUR NEGATIVE 08/06/2016 1417   BILIRUBINUR NEGATIVE 08/06/2016 1417   KETONESUR NEGATIVE 08/06/2016 1417   PROTEINUR  TRACE (A) 08/06/2016 1417   UROBILINOGEN 0.2 09/08/2015 1740   NITRITE POSITIVE (A) 08/06/2016 1417   LEUKOCYTESUR NEGATIVE 08/06/2016 1417   Sepsis Labs Invalid input(s): PROCALCITONIN,  WBC,  LACTICIDVEN Microbiology Recent Results (from the past 240 hour(s))  Culture, blood (Routine X 2) w Reflex to ID Panel     Status: None   Collection Time: 12/13/16  3:26 PM  Result Value Ref Range Status   Specimen Description RIGHT ANTECUBITAL  Final   Special Requests BOTTLES DRAWN AEROBIC AND ANAEROBIC [facility] EACH  Final   Culture NO GROWTH 5 DAYS  Final   Report Status 12/18/2016 FINAL  Final  Culture, blood (Routine X 2) w Reflex to ID Panel     Status: None   Collection Time: 12/13/16  3:37 PM  Result Value Ref Range Status   Specimen Description LEFT ANTECUBITAL  Final   Special Requests BOTTLES DRAWN AEROBIC AND ANAEROBIC [facility] EACH  Final   Culture NO GROWTH 5 DAYS  Final   Report Status 12/18/2016 FINAL  Final  Aerobic/Anaerobic Culture (surgical/deep wound)     Status: None (Preliminary result)   Collection Time: 12/16/16  1:43 PM  Result Value Ref Range Status   Specimen Description   Final    TISSUE RIGHT 1ST TOE Performed at [facility], [address] [place], [place] [address]    Special Requests   Final    DEEP SURGICAL Performed at [facility], [address], [place], [place] [address]    Gram Stain   Final    RARE WBC PRESENT, PREDOMINANTLY PMN FEW GRAM POSITIVE COCCI IN PAIRS FEW GRAM VARIABLE ROD    Culture   Final    RARE STAPHYLOCOCCUS AUREUS HOLDING FOR POSSIBLE ANAEROBE Performed at [facility], [address] [place], [place] [address]    Report Status PENDING  Incomplete   Organism ID, Bacteria STAPHYLOCOCCUS AUREUS  Final      Susceptibility   Staphylococcus aureus - MIC*    CIPROFLOXACIN <=0.5 SENSITIVE Sensitive     ERYTHROMYCIN <=0.25 SENSITIVE Sensitive     GENTAMICIN <=0.5 SENSITIVE Sensitive      OXACILLIN <=0.25 SENSITIVE Sensitive     TETRACYCLINE <=1 SENSITIVE Sensitive     VANCOMYCIN <=0.5 SENSITIVE Sensitive     TRIMETH/SULFA <=10 SENSITIVE Sensitive     CLINDAMYCIN <=0.25 SENSITIVE Sensitive     RIFAMPIN <=0.5 SENSITIVE Sensitive     Inducible Clindamycin NEGATIVE Sensitive     * RARE STAPHYLOCOCCUS AUREUS    Time coordinating discharge: Over 30 minutes SIGNED:  Houston Siren, MD FACP [facility] 12/19/2016, 1:14 PM   If 7PM-7AM, please contact night-coverage [email or web address] Password [email or web address]

## 2016-12-19 NOTE — Progress Notes (Signed)
Pt IV removed, WNL. D/C instructions given to pt. Verbalized understanding. Pt family member present to take home.  

## 2016-12-21 LAB — AEROBIC/ANAEROBIC CULTURE W GRAM STAIN (SURGICAL/DEEP WOUND)

## 2016-12-21 LAB — AEROBIC/ANAEROBIC CULTURE (SURGICAL/DEEP WOUND)

## 2017-01-01 ENCOUNTER — Encounter: Payer: Self-pay | Admitting: Physician Assistant

## 2017-01-01 ENCOUNTER — Ambulatory Visit: Payer: Self-pay | Admitting: Physician Assistant

## 2017-01-01 VITALS — BP 124/70 | HR 82 | Temp 97.7°F | Ht 66.0 in | Wt 340.5 lb

## 2017-01-01 DIAGNOSIS — E1165 Type 2 diabetes mellitus with hyperglycemia: Secondary | ICD-10-CM

## 2017-01-01 DIAGNOSIS — Z9119 Patient's noncompliance with other medical treatment and regimen: Secondary | ICD-10-CM

## 2017-01-01 DIAGNOSIS — E118 Type 2 diabetes mellitus with unspecified complications: Principal | ICD-10-CM

## 2017-01-01 DIAGNOSIS — Z91199 Patient's noncompliance with other medical treatment and regimen due to unspecified reason: Secondary | ICD-10-CM

## 2017-01-01 MED ORDER — SITAGLIPTIN PHOSPHATE 100 MG PO TABS: 100.0000 mg | ORAL_TABLET | Freq: Every day | ORAL | 2 refills | Status: DC

## 2017-01-01 NOTE — Progress Notes (Signed)
BP 124/70 (BP Location: Left Arm, Patient Position: Sitting, Cuff Size: Large)   Pulse 82   Temp 97.7 F (36.5 C) (Other (Comment))   Ht 5\' 6"  (1.676 m)   Wt (!) 340 lb 8 oz (154.4 kg)   LMP 12/31/2016 (Exact Date)   SpO2 97%   BMI 54.96 kg/m    Subjective:    Patient ID: Rachel George, female    DOB: Apr 24, 1981, 36 y.o.   MRN: [id number]  HPI: Rachel George is a 36 y.o. female presenting on 01/01/2017 for Diabetes   HPI   Pt did not bring bs log.  She says she has been checking it more but is currently out of stips.  bs 154 this am.  a1c was 10.6 when in hospital (12/15/16)  Pt has appt with surgeon later this afternoon.   Pt is still out of work.   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 Says she didn't need outpatient therapy  Pt quit smoking  Relevant past medical, surgical, family and social history reviewed and updated as indicated. Interim medical history since our last visit reviewed. Allergies and medications reviewed and updated.   Current Outpatient Prescriptions:  .  albuterol (PROVENTIL HFA;VENTOLIN HFA) 108 (90 Base) MCG/ACT inhaler, Inhale 2 puffs into the lungs every 6 (six) hours as needed for wheezing or shortness of breath., Disp: , Rfl:  .  benzonatate (TESSALON PERLES) 100 MG capsule, 1- 2 capsules po q 8 hour prn cough (Patient taking differently: Take 100-200 mg by mouth every 8 (eight) hours as needed for cough. ), Disp: 20 capsule, Rfl: 3 .  diclofenac (VOLTAREN) 75 MG EC tablet, Take 1 tablet (75 mg total) by mouth 2 (two) times daily as needed., Disp: 60 tablet, Rfl: 0 .  doxycycline (VIBRAMYCIN) 50 MG capsule, Take 1 capsule (50 mg total) by mouth 2 (two) times daily., Disp: 28 capsule, Rfl: 0 .  insulin aspart (NOVOLOG) 100 UNIT/ML injection, Inject 10 Units into the skin 3 (three) times daily before meals. 10 units plus sliding scales., Disp: , Rfl:  .  insulin detemir (LEVEMIR) 100 UNIT/ML injection, Inject 55 Units into  the skin at bedtime. Reported on 04/16/2016, Disp: , Rfl:  .  lovastatin (MEVACOR) 20 MG tablet, Take 1 tablet (20 mg total) by mouth at bedtime., Disp: 30 tablet, Rfl: 3 .  metFORMIN (GLUCOPHAGE) 500 MG tablet, Take 1 tablet (500 mg total) by mouth 2 (two) times daily with a meal., Disp: 28 tablet, Rfl: 1 .  tetrahydrozoline-zinc (VISINE-AC) 0.05-0.25 % ophthalmic solution, Place 2 drops into both eyes 3 (three) times daily as needed., Disp: , Rfl:  .  traMADol (ULTRAM) 50 MG tablet, Take 1 tablet (50 mg total) by mouth every 6 (six) hours as needed., Disp: 15 tablet, Rfl: 0 .  sitaGLIPtin (JANUVIA) 100 MG tablet, Take 1 tablet (100 mg total) by mouth daily. (Patient not taking: Reported on 01/01/2017), Disp: 90 tablet, Rfl: 2    Review of Systems  Constitutional: Positive for fatigue. Negative for appetite change, chills, diaphoresis, fever and unexpected weight change.  HENT: Negative for congestion, drooling, ear pain, facial swelling, hearing loss, mouth sores, sneezing, sore throat, trouble swallowing and voice change.   Eyes: Positive for pain, discharge, redness, itching and visual disturbance.  Respiratory: Positive for cough. Negative for choking, shortness of breath and wheezing.   Cardiovascular: Positive for leg swelling. Negative for chest pain and palpitations.  Gastrointestinal: Negative for abdominal pain, blood in  stool, constipation, diarrhea and vomiting.  Endocrine: Positive for cold intolerance. Negative for heat intolerance and polydipsia.  Genitourinary: Negative for decreased urine volume, dysuria and hematuria.  Musculoskeletal: Positive for gait problem. Negative for arthralgias and back pain.  Skin: Negative for rash.  Allergic/Immunologic: Positive for environmental allergies.  Neurological: Negative for seizures, syncope, light-headedness and headaches.  Hematological: Negative for adenopathy.  Psychiatric/Behavioral: Positive for agitation. Negative for dysphoric  mood and suicidal ideas. The patient is not nervous/anxious.     Per HPI unless specifically indicated above     Objective:    BP 124/70 (BP Location: Left Arm, Patient Position: Sitting, Cuff Size: Large)   Pulse 82   Temp 97.7 F (36.5 C) (Other (Comment))   Ht 5\' 6"  (1.676 m)   Wt (!) 340 lb 8 oz (154.4 kg)   LMP 12/31/2016 (Exact Date)   SpO2 97%   BMI 54.96 kg/m   Wt Readings from Last 3 Encounters:  01/01/17 (!) 340 lb 8 oz (154.4 kg)  12/16/16 (!) 340 lb (154.2 kg)  12/11/16 (!) 340 lb 8 oz (154.4 kg)    Physical Exam  Constitutional: She is oriented to person, place, and time. She appears well-developed and well-nourished.  HENT:  Head: Normocephalic and atraumatic.  Neck: Neck supple.  Cardiovascular: Normal rate and regular rhythm.   Pulmonary/Chest: Effort normal and breath sounds normal.  Abdominal: Soft. Bowel sounds are normal. She exhibits no mass. There is no hepatosplenomegaly. There is no tenderness.  Musculoskeletal: She exhibits no edema.  (Exam R foot deferred as pt has appt this afternoon with surgeon for post-op visit)  Lymphadenopathy:    She has no cervical adenopathy.  Neurological: She is alert and oriented to person, place, and time.  Skin: Skin is warm and dry.  Psychiatric: She has a normal mood and affect. Her behavior is normal.  Vitals reviewed.       Assessment & Plan:   Encounter Diagnoses  Name Primary?  Marland Kitchen. Uncontrolled type 2 diabetes mellitus with complication, unspecified long term insulin use status (HCC) Yes  . Personal history of noncompliance with medical treatment, presenting hazards to health     -Gave samples januvia and papers for medasisst to get her re-certified -pt to Increase novolog to 15u + ssi tid. Continue levermir.  Resume januvia -congratulated pt on stopping smoking and encouraged her to maintain nonsmoking status -pt given appointment information for diabetic eye exam -F/u 1 month with bs log

## 2017-01-01 NOTE — Patient Instructions (Signed)
Diabetic Eye Exam [facility] [place] [address] [place] [id number] [phone] Fri. February 07, 2017 1:20 PM Dr. Charise Killianotter

## 2017-01-27 ENCOUNTER — Encounter: Payer: Self-pay | Admitting: Physician Assistant

## 2017-01-27 ENCOUNTER — Ambulatory Visit: Payer: Self-pay | Admitting: Physician Assistant

## 2017-01-27 VITALS — BP 130/78 | HR 85 | Temp 97.5°F | Ht 66.0 in | Wt 347.0 lb

## 2017-01-27 DIAGNOSIS — J069 Acute upper respiratory infection, unspecified: Secondary | ICD-10-CM

## 2017-01-27 MED ORDER — BENZONATATE 100 MG PO CAPS: ORAL_CAPSULE | ORAL | 3 refills | Status: DC

## 2017-01-27 NOTE — Patient Instructions (Signed)
Rest. Fluids. Sudafed.  Antihistamine.     Upper Respiratory Infection, Adult Most upper respiratory infections (URIs) are a viral infection of the air passages leading to the lungs. A URI affects the nose, throat, and upper air passages. The most common type of URI is nasopharyngitis and is typically referred to as "the common cold." URIs run their course and usually go away on their own. Most of the time, a URI does not require medical attention, but sometimes a bacterial infection in the upper airways can follow a viral infection. This is called a secondary infection. Sinus and middle ear infections are common types of secondary upper respiratory infections. Bacterial pneumonia can also complicate a URI. A URI can worsen asthma and chronic obstructive pulmonary disease (COPD). Sometimes, these complications can require emergency medical care and may be life threatening. What are the causes? Almost all URIs are caused by viruses. A virus is a type of germ and can spread from one person to another. What increases the risk? You may be at risk for a URI if:  You smoke.  You have chronic heart or lung disease.  You have a weakened defense (immune) system.  You are very young or very old.  You have nasal allergies or asthma.  You work in crowded or poorly ventilated areas.  You work in health care facilities or schools. What are the signs or symptoms? Symptoms typically develop 2-3 days after you come in contact with a cold virus. Most viral URIs last 7-10 days. However, viral URIs from the influenza virus (flu virus) can last 14-18 days and are typically more severe. Symptoms may include:  Runny or stuffy (congested) nose.  Sneezing.  Cough.  Sore throat.  Headache.  Fatigue.  Fever.  Loss of appetite.  Pain in your forehead, behind your eyes, and over your cheekbones (sinus pain).  Muscle aches. How is this diagnosed? Your health care provider may diagnose a URI  by:  Physical exam.  Tests to check that your symptoms are not due to another condition such as:  Strep throat.  Sinusitis.  Pneumonia.  Asthma. How is this treated? A URI goes away on its own with time. It cannot be cured with medicines, but medicines may be prescribed or recommended to relieve symptoms. Medicines may help:  Reduce your fever.  Reduce your cough.  Relieve nasal congestion. Follow these instructions at home:  Take medicines only as directed by your health care provider.  Gargle warm saltwater or take cough drops to comfort your throat as directed by your health care provider.  Use a warm mist humidifier or inhale steam from a shower to increase air moisture. This may make it easier to breathe.  Drink enough fluid to keep your urine clear or pale yellow.  Eat soups and other clear broths and maintain good nutrition.  Rest as needed.  Return to work when your temperature has returned to normal or as your health care provider advises. You may need to stay home longer to avoid infecting others. You can also use a face mask and careful hand washing to prevent spread of the virus.  Increase the usage of your inhaler if you have asthma.  Do not use any tobacco products, including cigarettes, chewing tobacco, or electronic cigarettes. If you need help quitting, ask your health care provider. How is this prevented? The best way to protect yourself from getting a cold is to practice good hygiene.  Avoid oral or hand contact with people with  cold symptoms.  Wash your hands often if contact occurs. There is no clear evidence that vitamin C, vitamin E, echinacea, or exercise reduces the chance of developing a cold. However, it is always recommended to get plenty of rest, exercise, and practice good nutrition. Contact a health care provider if:  You are getting worse rather than better.  Your symptoms are not controlled by medicine.  You have chills.  You  have worsening shortness of breath.  You have brown or red mucus.  You have yellow or brown nasal discharge.  You have pain in your face, especially when you bend forward.  You have a fever.  You have swollen neck glands.  You have pain while swallowing.  You have white areas in the back of your throat. Get help right away if:  You have severe or persistent:  Headache.  Ear pain.  Sinus pain.  Chest pain.  You have chronic lung disease and any of the following:  Wheezing.  Prolonged cough.  Coughing up blood.  A change in your usual mucus.  You have a stiff neck.  You have changes in your:  Vision.  Hearing.  Thinking.  Mood. This information is not intended to replace advice given to you by your health care provider. Make sure you discuss any questions you have with your health care provider. Document Released: 05/14/2001 Document Revised: 07/21/2016 Document Reviewed: 02/23/2014 Elsevier Interactive Patient Education  2017 [employer]

## 2017-01-27 NOTE — Progress Notes (Signed)
BP 130/78 (BP Location: Left Arm, Patient Position: Sitting, Cuff Size: Large)   Pulse 85   Temp 97.5 F (36.4 C) (Other (Comment))   Ht 5\' 6"  (1.676 m)   Wt (!) 347 lb (157.4 kg)   LMP 12/31/2016 (Exact Date)   SpO2 99%   BMI 56.01 kg/m    Subjective:    Patient ID: Rachel George, female    DOB: [birth year], 36 y.o.   MRN: [id number]  HPI: Rachel George is a 36 y.o. female presenting on 01/27/2017 for Nasal Congestion (also c/o ears itching and clogged, throat is scratchy, cough produces white mucus with odor, some nasal, red-streaked drainage)   HPI    Chief Complaint  Patient presents with  . Nasal Congestion    also c/o ears itching and clogged, throat is scratchy, cough produces white mucus with odor, some nasal, red-streaked drainage     Pt started feeling bad on Thursday  Pt is still abstaining from smoking  She is using tylenol allergy  Pt denies GI symptoms  Relevant past medical, surgical, family and social history reviewed and updated as indicated. Interim medical history since our last visit reviewed. Allergies and medications reviewed and updated.   Current Outpatient Prescriptions:  .  albuterol (PROVENTIL HFA;VENTOLIN HFA) 108 (90 Base) MCG/ACT inhaler, Inhale 2 puffs into the lungs every 6 (six) hours as needed for wheezing or shortness of breath., Disp: , Rfl:  .  diclofenac (VOLTAREN) 75 MG EC tablet, Take 1 tablet (75 mg total) by mouth 2 (two) times daily as needed., Disp: 60 tablet, Rfl: 0 .  insulin aspart (NOVOLOG) 100 UNIT/ML injection, Inject 15 Units into the skin 3 (three) times daily before meals. 15 units plus sliding scales., Disp: , Rfl:  .  insulin detemir (LEVEMIR) 100 UNIT/ML injection, Inject 55 Units into the skin at bedtime. Reported on 04/16/2016, Disp: , Rfl:  .  lovastatin (MEVACOR) 20 MG tablet, Take 1 tablet (20 mg total) by mouth at bedtime., Disp: 30 tablet, Rfl: 3 .  metFORMIN (GLUCOPHAGE) 500 MG tablet, Take 1 tablet (500  mg total) by mouth 2 (two) times daily with a meal., Disp: 28 tablet, Rfl: 1 .  sitaGLIPtin (JANUVIA) 100 MG tablet, Take 1 tablet (100 mg total) by mouth daily., Disp: 90 tablet, Rfl: 2 .  tetrahydrozoline-zinc (VISINE-AC) 0.05-0.25 % ophthalmic solution, Place 2 drops into both eyes 3 (three) times daily as needed., Disp: , Rfl:  .  traMADol (ULTRAM) 50 MG tablet, Take 1 tablet (50 mg total) by mouth every 6 (six) hours as needed., Disp: 15 tablet, Rfl: 0   Review of Systems  Constitutional: Positive for chills, diaphoresis, fatigue and fever. Negative for appetite change and unexpected weight change.  HENT: Positive for congestion, sneezing, sore throat and trouble swallowing. Negative for dental problem, drooling, ear pain, facial swelling, hearing loss, mouth sores and voice change.   Eyes: Positive for pain, discharge, redness, itching and visual disturbance.  Respiratory: Negative for cough, choking, shortness of breath and wheezing.   Cardiovascular: Negative for chest pain, palpitations and leg swelling.  Gastrointestinal: Negative for abdominal pain, blood in stool, constipation, diarrhea and vomiting.  Endocrine: Negative for cold intolerance, heat intolerance and polydipsia.  Genitourinary: Negative for decreased urine volume, dysuria and hematuria.  Musculoskeletal: Negative for arthralgias, back pain and gait problem.  Skin: Negative for rash.  Allergic/Immunologic: Positive for environmental allergies.  Neurological: Positive for headaches. Negative for seizures, syncope and light-headedness.  Hematological: Negative for adenopathy.  Psychiatric/Behavioral: Positive for agitation and suicidal ideas. Negative for dysphoric mood. The patient is nervous/anxious.     Per HPI unless specifically indicated above     Objective:    BP 130/78 (BP Location: Left Arm, Patient Position: Sitting, Cuff Size: Large)   Pulse 85   Temp 97.5 F (36.4 C) (Other (Comment))   Ht 5\' 6"   (1.676 m)   Wt (!) 347 lb (157.4 kg)   LMP 12/31/2016 (Exact Date)   SpO2 99%   BMI 56.01 kg/m   Wt Readings from Last 3 Encounters:  01/27/17 (!) 347 lb (157.4 kg)  01/01/17 (!) 340 lb 8 oz (154.4 kg)  12/16/16 (!) 340 lb (154.2 kg)    Physical Exam  Constitutional: She is oriented to person, place, and time. She appears well-developed and well-nourished.  HENT:  Head: Normocephalic and atraumatic.  Right Ear: Hearing, tympanic membrane, external ear and ear canal normal.  Left Ear: Hearing, tympanic membrane, external ear and ear canal normal.  Nose: Mucosal edema and rhinorrhea present.  Mouth/Throat: Uvula is midline and oropharynx is clear and moist. No oropharyngeal exudate.  Neck: Neck supple.  Cardiovascular: Normal rate and regular rhythm.   Pulmonary/Chest: Effort normal and breath sounds normal. She has no wheezes.  Abdominal: Soft. Bowel sounds are normal. She exhibits no mass. There is no hepatosplenomegaly. There is no tenderness.  Musculoskeletal: She exhibits no edema.  Lymphadenopathy:    She has no cervical adenopathy.  Neurological: She is alert and oriented to person, place, and time.  Skin: Skin is warm and dry.  Psychiatric: She has a normal mood and affect. Her behavior is normal.  Vitals reviewed.       Assessment & Plan:    Encounter Diagnosis  Name Primary?  . Acute upper respiratory infection Yes     -pt counseled on rest, fluids, OTCs prn. rx tessalon for cough -f/u routine as scheduled. RTO sooner prn

## 2017-01-28 ENCOUNTER — Ambulatory Visit: Payer: Self-pay | Admitting: Physician Assistant

## 2017-01-29 ENCOUNTER — Ambulatory Visit: Payer: Self-pay | Admitting: Physician Assistant

## 2017-02-04 ENCOUNTER — Ambulatory Visit: Payer: Self-pay | Admitting: Physician Assistant

## 2017-02-06 ENCOUNTER — Encounter: Payer: Self-pay | Admitting: Physician Assistant

## 2017-02-06 ENCOUNTER — Ambulatory Visit: Payer: Self-pay | Admitting: Physician Assistant

## 2017-02-06 VITALS — BP 138/76 | HR 91 | Temp 97.5°F | Ht 66.0 in | Wt 347.0 lb

## 2017-02-06 DIAGNOSIS — Z899 Acquired absence of limb, unspecified: Secondary | ICD-10-CM

## 2017-02-06 DIAGNOSIS — E1165 Type 2 diabetes mellitus with hyperglycemia: Secondary | ICD-10-CM

## 2017-02-06 DIAGNOSIS — E1142 Type 2 diabetes mellitus with diabetic polyneuropathy: Secondary | ICD-10-CM

## 2017-02-06 DIAGNOSIS — E118 Type 2 diabetes mellitus with unspecified complications: Secondary | ICD-10-CM

## 2017-02-06 HISTORY — DX: Acquired absence of limb, unspecified: Z89.9

## 2017-02-06 MED ORDER — METFORMIN HCL 500 MG PO TABS: 500.0000 mg | ORAL_TABLET | Freq: Two times a day (BID) | ORAL | 1 refills | Status: DC

## 2017-02-06 NOTE — Progress Notes (Signed)
BP 138/76 (BP Location: Left Arm, Patient Position: Sitting, Cuff Size: Large)   Pulse 91   Temp 97.5 F (36.4 C)   Ht 5\' 6"  (1.676 m)   Wt (!) 347 lb (157.4 kg)   SpO2 99%   BMI 56.01 kg/m    Subjective:    Patient ID: Rachel George, female    DOB: 1981-01-03, 36 y.o.   MRN: [id number]  HPI: Rachel George is a 36 y.o. female presenting on 02/06/2017 for Diabetes (pt states she ran out of metformin 2 weeks ago)   HPI  Pt using novolg 15u + ssi tid  She has eye appt tomorrow  Pt started back to work 2/16.  Says doing okay. Still working on improving the walking and her foot still hurts some.    bs log reviewed-  Low 100's  None over 200    Relevant past medical, surgical, family and social history reviewed and updated as indicated. Interim medical history since our last visit reviewed. Allergies and medications reviewed and updated.   Current Outpatient Prescriptions:  .  albuterol (PROVENTIL HFA;VENTOLIN HFA) 108 (90 Base) MCG/ACT inhaler, Inhale 2 puffs into the lungs every 6 (six) hours as needed for wheezing or shortness of breath., Disp: , Rfl:  .  benzonatate (TESSALON) 100 MG capsule, 1-2 po q 8 hour prn cough, Disp: 20 capsule, Rfl: 3 .  diclofenac (VOLTAREN) 75 MG EC tablet, Take 1 tablet (75 mg total) by mouth 2 (two) times daily as needed., Disp: 60 tablet, Rfl: 0 .  insulin aspart (NOVOLOG) 100 UNIT/ML injection, Inject 15 Units into the skin 3 (three) times daily before meals. 15 units plus sliding scales., Disp: , Rfl:  .  insulin detemir (LEVEMIR) 100 UNIT/ML injection, Inject 55 Units into the skin at bedtime. Reported on 04/16/2016, Disp: , Rfl:  .  lovastatin (MEVACOR) 20 MG tablet, Take 1 tablet (20 mg total) by mouth at bedtime., Disp: 30 tablet, Rfl: 3 .  sitaGLIPtin (JANUVIA) 100 MG tablet, Take 1 tablet (100 mg total) by mouth daily., Disp: 90 tablet, Rfl: 2 .  traMADol (ULTRAM) 50 MG tablet, Take 1 tablet (50 mg total) by mouth every 6 (six) hours  as needed., Disp: 15 tablet, Rfl: 0 .  metFORMIN (GLUCOPHAGE) 500 MG tablet, Take 1 tablet (500 mg total) by mouth 2 (two) times daily with a meal. (Patient not taking: Reported on 02/06/2017), Disp: 28 tablet, Rfl: 1   Review of Systems  Constitutional: Negative for appetite change, chills, diaphoresis, fatigue, fever and unexpected weight change.  HENT: Positive for congestion and sneezing. Negative for dental problem, drooling, ear pain, facial swelling, hearing loss, mouth sores, sore throat, trouble swallowing and voice change.   Eyes: Positive for redness and itching. Negative for pain, discharge and visual disturbance.  Respiratory: Negative for cough, choking, shortness of breath and wheezing.   Cardiovascular: Negative for chest pain, palpitations and leg swelling.  Gastrointestinal: Positive for diarrhea. Negative for abdominal pain, blood in stool, constipation and vomiting.  Endocrine: Negative for cold intolerance, heat intolerance and polydipsia.  Genitourinary: Negative for decreased urine volume, dysuria and hematuria.  Musculoskeletal: Negative for arthralgias, back pain and gait problem.  Skin: Negative for rash.  Allergic/Immunologic: Positive for environmental allergies.  Neurological: Positive for headaches. Negative for seizures, syncope and light-headedness.  Hematological: Negative for adenopathy.  Psychiatric/Behavioral: Positive for agitation. Negative for dysphoric mood and suicidal ideas. The patient is nervous/anxious.     Per HPI unless specifically indicated above  Objective:    BP 138/76 (BP Location: Left Arm, Patient Position: Sitting, Cuff Size: Large)   Pulse 91   Temp 97.5 F (36.4 C)   Ht 5\' 6"  (1.676 m)   Wt (!) 347 lb (157.4 kg)   SpO2 99%   BMI 56.01 kg/m   Wt Readings from Last 3 Encounters:  02/06/17 (!) 347 lb (157.4 kg)  01/27/17 (!) 347 lb (157.4 kg)  01/01/17 (!) 340 lb 8 oz (154.4 kg)    Physical Exam  Constitutional: She is  oriented to person, place, and time. She appears well-developed and well-nourished.  HENT:  Head: Normocephalic and atraumatic.  Neck: Neck supple.  Cardiovascular: Normal rate and regular rhythm.   Pulmonary/Chest: Effort normal and breath sounds normal.  Abdominal: Soft. Bowel sounds are normal. She exhibits no mass. There is no hepatosplenomegaly. There is no tenderness.  Musculoskeletal: She exhibits no edema.       Feet:  S/p R great toe amputation.  No redness.  One small area of oozing serous fluid.  Does not appear purulent.  No odor. No redness.  Skin bilateral feet thickened and dry.  Several nails are extremely thick and long.    Lymphadenopathy:    She has no cervical adenopathy.  Neurological: She is alert and oriented to person, place, and time.  Skin: Skin is warm and dry.  Psychiatric: She has a normal mood and affect. Her behavior is normal.  Vitals reviewed.        Assessment & Plan:   Encounter Diagnoses  Name Primary?  . S/P amputation Yes  . Uncontrolled type 2 diabetes mellitus with complication, unspecified long term insulin use status (HCC)   . Diabetic polyneuropathy associated with type 2 diabetes mellitus (HCC)      -pt to Continue current medications -Counseled on feet care -F/u for recheck foot 1-2 wk  -RTO sooner prn worsening or new symptoms- specifically for drainage from the surgical site, odor, redness, pain. -we Will try to find podiatrist that accepts [facility] for toenails  -will recheck a1c mid-april

## 2017-02-17 ENCOUNTER — Ambulatory Visit: Payer: Self-pay | Admitting: Physician Assistant

## 2017-02-18 ENCOUNTER — Ambulatory Visit: Payer: Self-pay | Admitting: Physician Assistant

## 2017-02-18 ENCOUNTER — Encounter: Payer: Self-pay | Admitting: Physician Assistant

## 2017-02-18 VITALS — BP 136/78 | HR 91 | Temp 97.7°F | Ht 66.0 in | Wt 351.5 lb

## 2017-02-18 DIAGNOSIS — L602 Onychogryphosis: Secondary | ICD-10-CM

## 2017-02-18 DIAGNOSIS — E1165 Type 2 diabetes mellitus with hyperglycemia: Secondary | ICD-10-CM

## 2017-02-18 DIAGNOSIS — Z899 Acquired absence of limb, unspecified: Secondary | ICD-10-CM

## 2017-02-18 DIAGNOSIS — D649 Anemia, unspecified: Secondary | ICD-10-CM

## 2017-02-18 DIAGNOSIS — E1142 Type 2 diabetes mellitus with diabetic polyneuropathy: Secondary | ICD-10-CM

## 2017-02-18 DIAGNOSIS — E118 Type 2 diabetes mellitus with unspecified complications: Secondary | ICD-10-CM

## 2017-02-18 DIAGNOSIS — R300 Dysuria: Secondary | ICD-10-CM

## 2017-02-18 LAB — POCT URINALYSIS DIPSTICK
Bilirubin, UA: NEGATIVE
Glucose, UA: NEGATIVE
Ketones, UA: NEGATIVE
Nitrite, UA: NEGATIVE
PH UA: 5.5 (ref 5.0–8.0)
PROTEIN UA: 100
SPEC GRAV UA: 1.02 (ref 1.030–1.035)
UROBILINOGEN UA: 0.2 (ref ?–2.0)

## 2017-02-18 IMAGING — DX DG FOOT COMPLETE 3+V*R*
3 series · 3 of 3 positions shown · non-contrast
Comparison: 09/04/2016

CLINICAL DATA: Diabetes, right great toe cellulitis/infection.

EXAM:
RIGHT FOOT COMPLETE - 3+ VIEW

[foot ap]
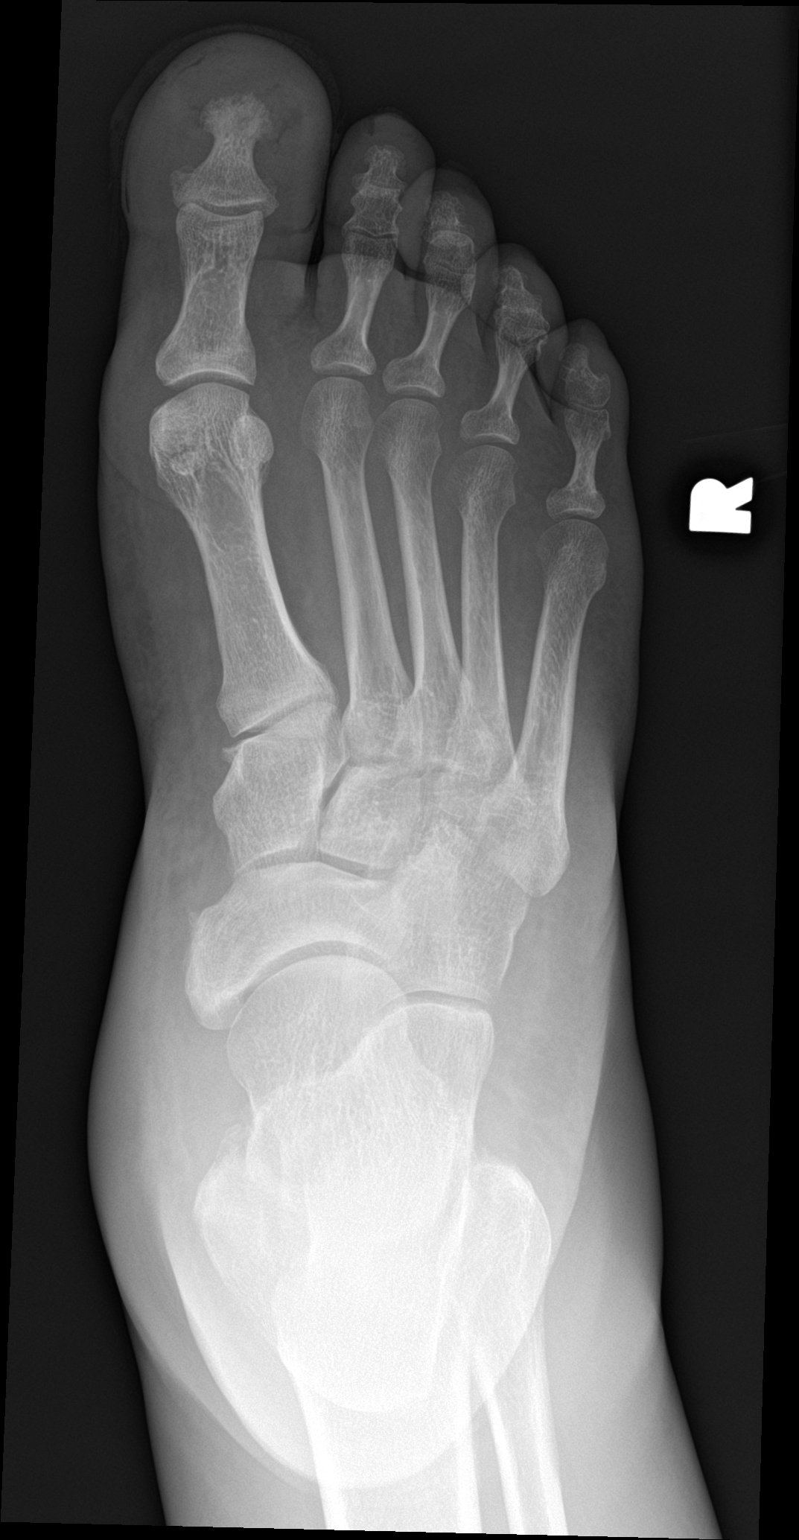

[foot obl]
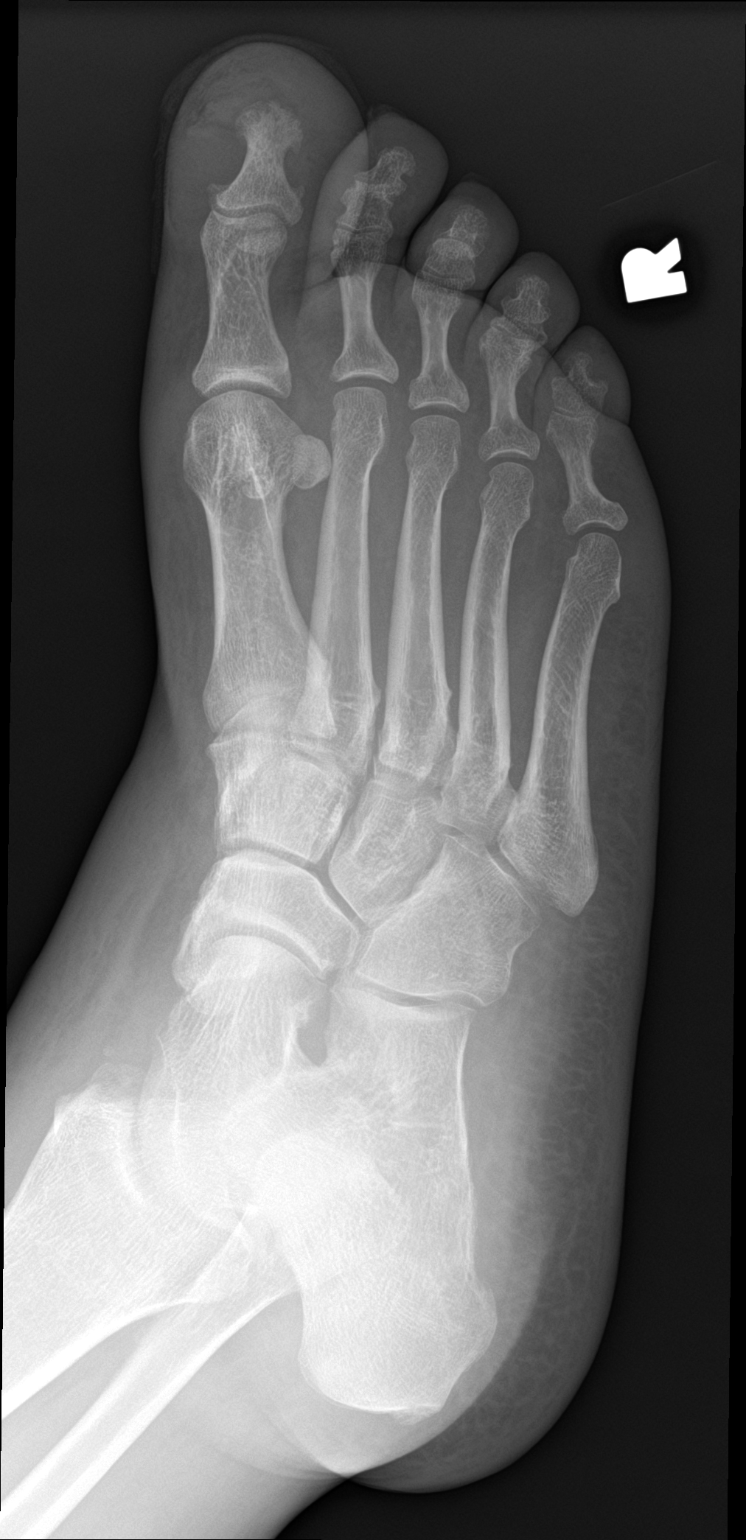

[foot lat]
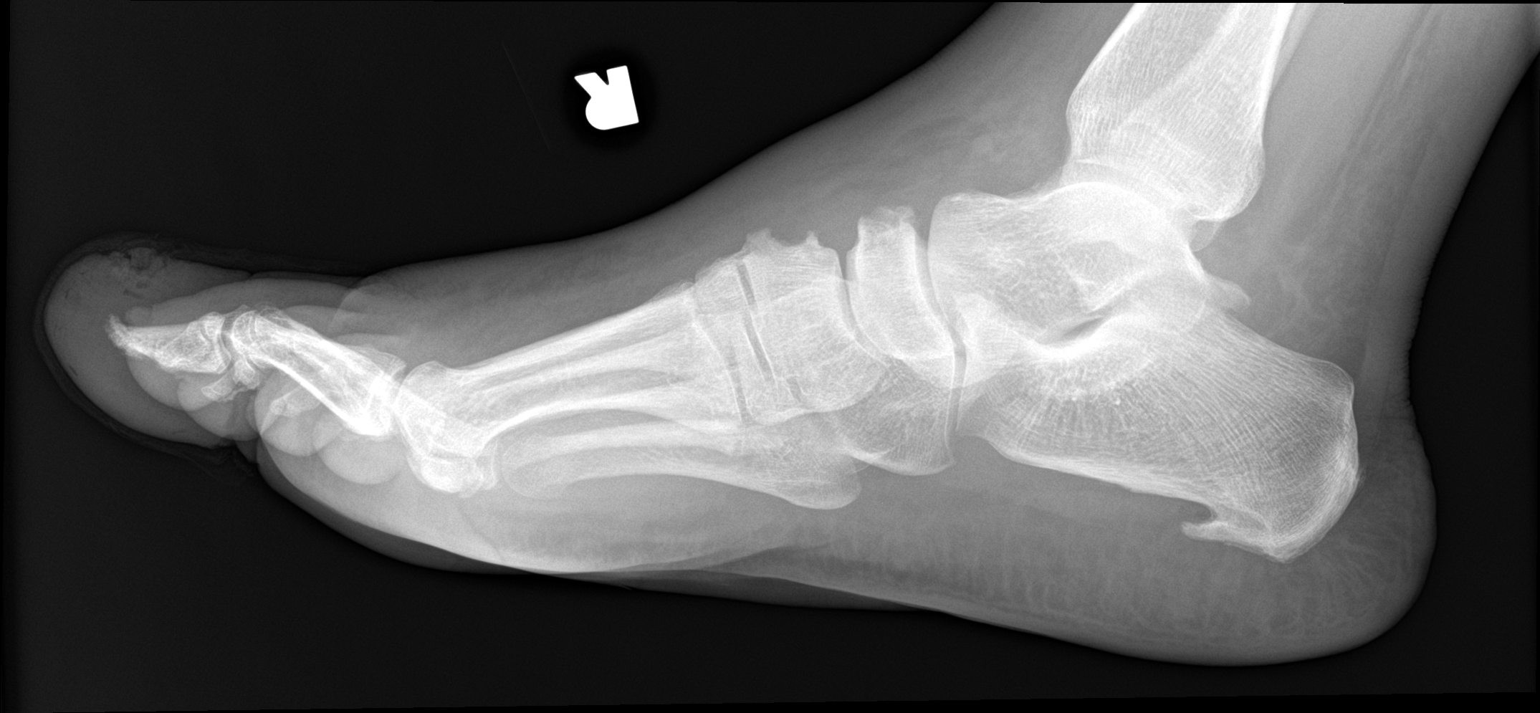

[3 of 3 positions shown; findings below may reference images not displayed]

FINDINGS: Soft tissue swelling and soft tissue gas within the enlarged right
great toe compatible with ongoing localized cellulitis. No
underlying osseous destruction, periostitis, malalignment, or
fracture. No definite plain film evidence of osteomyelitis. No
radiopaque foreign body. No other joint abnormality. Plantar
calcaneal spur noted.
IMPRESSION: Soft tissue swelling and subcutaneous air within the right great toe
compatible with ongoing cellulitis.

No other acute osseous finding or evidence of definite osteomyelitis
by plain radiography.

No radiopaque foreign body.

## 2017-02-18 MED ORDER — SULFAMETHOXAZOLE-TRIMETHOPRIM 800-160 MG PO TABS: 1.0000 | ORAL_TABLET | Freq: Two times a day (BID) | ORAL | 0 refills | Status: AC

## 2017-02-18 NOTE — Patient Instructions (Signed)
[facility]    Financial Counseling- [phone] or [phone]

## 2017-02-18 NOTE — Progress Notes (Signed)
BP 136/78 (BP Location: Left Arm, Patient Position: Sitting, Cuff Size: Large)   Pulse 91   Temp 97.7 F (36.5 C) (Other (Comment))   Ht 5\' 6"  (1.676 m)   Wt (!) 351 lb 8 oz (159.4 kg)   LMP 01/28/2017 (Approximate)   SpO2 96%   BMI 56.73 kg/m    Subjective:    Patient ID: Rachel George, female    DOB: 06/25/1981, 36 y.o.   MRN: [id number]  HPI: Rachel George is a 36 y.o. female presenting on 02/18/2017 for Diabetes (foot check)   HPI  Pt in today for foot check.  She says it is not draining at the stump site.  She is having pain in the foot but doesn't think that this is increased.   Pt complains of mild dysuria past 4-5 days.  No fevers or abdominal pain  Pt states anxiety and depression.  She says it mostly started after going to the eye doctor and hearing that her eyes have problems.  She says she has [facility] which she has to do before being seen by the eye specialst but she says she can't ever get anyone to answer the phone.    She says she just feels like she has a lot going on a she is continuing to work while she is dealing with her foot problems and her eye problems and now the dysuria.     Relevant past medical, surgical, family and social history reviewed and updated as indicated. Interim medical history since our last visit reviewed. Allergies and medications reviewed and updated.  CURRENT MEDS: Albuterol Tessalon Diclofenac novolg levemir Lovastatin Metformin januvia Tramadol   Review of Systems  Constitutional: Positive for appetite change, chills, fatigue and unexpected weight change. Negative for diaphoresis and fever.  HENT: Positive for sneezing. Negative for congestion, dental problem, drooling, ear pain, facial swelling, hearing loss, mouth sores, sore throat, trouble swallowing and voice change.   Eyes: Positive for pain, discharge, redness, itching and visual disturbance.  Respiratory: Negative  for cough, choking, shortness of breath and wheezing.   Cardiovascular: Positive for leg swelling. Negative for chest pain and palpitations.  Gastrointestinal: Positive for constipation. Negative for abdominal pain, blood in stool, diarrhea and vomiting.  Endocrine: Positive for cold intolerance. Negative for heat intolerance and polydipsia.  Genitourinary: Positive for dysuria. Negative for decreased urine volume and hematuria.  Musculoskeletal: Positive for arthralgias and gait problem. Negative for back pain.  Skin: Negative for rash.  Allergic/Immunologic: Negative for environmental allergies.  Neurological: Positive for headaches. Negative for seizures, syncope and light-headedness.  Hematological: Negative for adenopathy.  Psychiatric/Behavioral: Positive for agitation and dysphoric mood. Negative for suicidal ideas. The patient is nervous/anxious.     Per HPI unless specifically indicated above     Objective:    BP 136/78 (BP Location: Left Arm, Patient Position: Sitting, Cuff Size: Large)   Pulse 91   Temp 97.7 F (36.5 C) (Other (Comment))   Ht 5\' 6"  (1.676 m)   Wt (!) 351 lb 8 oz (159.4 kg)   LMP 01/28/2017 (Approximate)   SpO2 96%   BMI 56.73 kg/m   Wt Readings from Last 3 Encounters:  02/18/17 (!) 351 lb 8 oz (159.4 kg)  02/06/17 (!) 347 lb (157.4 kg)  01/27/17 (!) 347 lb (157.4 kg)    Physical Exam  Constitutional: She is oriented to person, place, and time. She appears well-developed and well-nourished.  HENT:  Head: Normocephalic  and atraumatic.  Neck: Neck supple.  Cardiovascular: Normal rate and regular rhythm.   Pulmonary/Chest: Effort normal and breath sounds normal.  Abdominal: Soft. Bowel sounds are normal. She exhibits no distension and no mass. There is no tenderness. There is no rebound and no guarding.  Musculoskeletal:       Right foot: There is no tenderness and no swelling.  There is no drainage or open wound at the site of great toe amputation.   There is a scab.  The 2nd toe which at previous OV had very long thickened nail is now without a nail (pt says it just fell off).  The skin of the 2nd toe is very thick and blackened.  There is a small hole where the nail used to be- no drainage.    Neurological: She is alert and oriented to person, place, and time.  Skin: Skin is warm and dry.  Psychiatric: She has a normal mood and affect. Her behavior is normal. Thought content normal.  Nursing note and vitals reviewed.        Assessment & Plan:   Encounter Diagnoses  Name Primary?  . Dysuria Yes  . S/P amputation   . Uncontrolled type 2 diabetes mellitus with complication, unspecified long term insulin use status (HCC)   . Thickened nails   . Diabetic polyneuropathy associated with type 2 diabetes mellitus (HCC)   . Anemia, unspecified type     -will refer pt to podiastrist -rx Septra ds for urine and counseled pt to increase water -encouraged pt to contact [facility] or counseling.  Told her about walk-in services so she can go today or tomorrow -gave pt phone numbers for financial counseling office at [facility] so she can contact them in reference to seeing the eye specialist -f/u 1 month with labs before appointment

## 2017-02-24 ENCOUNTER — Encounter: Payer: Self-pay | Admitting: Physician Assistant

## 2017-03-03 ENCOUNTER — Encounter: Payer: Self-pay | Admitting: Physician Assistant

## 2017-03-06 ENCOUNTER — Emergency Department (HOSPITAL_COMMUNITY)
Admission: EM | Admit: 2017-03-06 | Discharge: 2017-03-06 | Disposition: A | Discharge status: Home / Self Care | Payer: Self-pay | Attending: Emergency Medicine | Admitting: Emergency Medicine

## 2017-03-06 ENCOUNTER — Encounter (HOSPITAL_COMMUNITY): Payer: Self-pay | Admitting: Emergency Medicine

## 2017-03-06 DIAGNOSIS — Z794 Long term (current) use of insulin: Secondary | ICD-10-CM | POA: Insufficient documentation

## 2017-03-06 DIAGNOSIS — K529 Noninfective gastroenteritis and colitis, unspecified: Secondary | ICD-10-CM | POA: Insufficient documentation

## 2017-03-06 DIAGNOSIS — Z79899 Other long term (current) drug therapy: Secondary | ICD-10-CM | POA: Insufficient documentation

## 2017-03-06 DIAGNOSIS — Z87891 Personal history of nicotine dependence: Secondary | ICD-10-CM | POA: Insufficient documentation

## 2017-03-06 DIAGNOSIS — E119 Type 2 diabetes mellitus without complications: Secondary | ICD-10-CM | POA: Insufficient documentation

## 2017-03-06 LAB — COMPREHENSIVE METABOLIC PANEL
ALK PHOS: 73 U/L (ref 38–126)
ALT: 20 U/L (ref 14–54)
ANION GAP: 7 (ref 5–15)
AST: 21 U/L (ref 15–41)
Albumin: 3.4 g/dL — ABNORMAL LOW (ref 3.5–5.0)
BUN: 16 mg/dL (ref 6–20)
CALCIUM: 8.5 mg/dL — AB (ref 8.9–10.3)
CHLORIDE: 103 mmol/L (ref 101–111)
CO2: 23 mmol/L (ref 22–32)
CREATININE: 0.8 mg/dL (ref 0.44–1.00)
GFR calc non Af Amer: >60 mL/min (ref >60)
Glucose, Bld: 301 mg/dL — ABNORMAL HIGH (ref 65–99)
Potassium: 3.9 mmol/L (ref 3.5–5.1)
SODIUM: 133 mmol/L — AB (ref 135–145)
Total Bilirubin: 0.2 mg/dL — ABNORMAL LOW (ref 0.3–1.2)
Total Protein: 6.9 g/dL (ref 6.5–8.1)

## 2017-03-06 LAB — URINALYSIS, ROUTINE W REFLEX MICROSCOPIC
BILIRUBIN URINE: NEGATIVE
GLUCOSE, UA: 50 mg/dL — AB
Hgb urine dipstick: NEGATIVE
Ketones, ur: NEGATIVE mg/dL
Leukocytes, UA: NEGATIVE
Nitrite: NEGATIVE
PH: 5 (ref 5.0–8.0)
PROTEIN: 100 mg/dL — AB
SPECIFIC GRAVITY, URINE: 1.019 (ref 1.005–1.030)

## 2017-03-06 LAB — CBC
HCT: 39.1 % (ref 36.0–46.0)
HEMOGLOBIN: 12.8 g/dL (ref 12.0–15.0)
MCH: 25.7 pg — ABNORMAL LOW (ref 26.0–34.0)
MCHC: 32.7 g/dL (ref 30.0–36.0)
MCV: 78.5 fL (ref 78.0–100.0)
PLATELETS: 235 10*3/uL (ref 150–400)
RBC: 4.98 MIL/uL (ref 3.87–5.11)
RDW: 15.1 % (ref 11.5–15.5)
WBC: 6.7 10*3/uL (ref 4.0–10.5)

## 2017-03-06 LAB — CBG MONITORING, ED: GLUCOSE-CAPILLARY: 288 mg/dL — AB (ref 65–99)

## 2017-03-06 LAB — LIPASE, BLOOD: LIPASE: 18 U/L (ref 11–51)

## 2017-03-06 MED ORDER — ONDANSETRON 8 MG PO TBDP
8.0000 mg | ORAL_TABLET | Freq: Once | ORAL | Status: DC
Start: 2017-03-06 — End: 2017-03-06

## 2017-03-06 MED ORDER — PROMETHAZINE HCL 25 MG/ML IJ SOLN
25.0000 mg | Freq: Once | INTRAMUSCULAR | Status: AC
  Administered 2017-03-06: 25 mg via INTRAMUSCULAR
  Filled 2017-03-06: qty 1

## 2017-03-06 MED ORDER — ONDANSETRON HCL 4 MG/2ML IJ SOLN
4.0000 mg | Freq: Once | INTRAMUSCULAR | Status: AC
  Administered 2017-03-06: 4 mg via INTRAVENOUS
  Filled 2017-03-06: qty 2

## 2017-03-06 NOTE — ED Triage Notes (Signed)
Patient c/o nausea, vomiting, diarrhea, body aches, and chills that started this morning at 2 am. Per patient co-workers having similar symptoms and diagnosed with flu. No active vomiting at this time. Temp 97.7 in triage.

## 2017-03-06 NOTE — ED Notes (Signed)
ED Provider at bedside. 

## 2017-03-06 NOTE — ED Notes (Signed)
Per patient x3 watery stools in past 24 hours. Patient reports being on antibiotics for UTI.

## 2017-03-06 NOTE — ED Provider Notes (Signed)
Medical screening examination/treatment/procedure(s) were conducted as a shared visit with non-physician practitioner(s) and myself.  I personally evaluated the patient during the encounter.  Clinical Impression:   Final diagnoses:  Gastroenteritis   The patient is an obese 36 year old female who approximately 3:00 AM started having nausea vomiting and diarrhea. This has been persistent throughout the morning. 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 At this time the patient has moderate nausea which has improved after Zofran.   Sx are persistent, gradually improving and not associated with fevers  On exam the patient is morbidly obese with minimal diffuse tenderness to the abdomen, clear heart and lung sounds, she is not tachycardic on my exam.  Her labs are unremarkable, pending a urinalysis. Anticipate discharge after better nausea control with intramuscular Phenergan.   Eber Hong, MD 03/07/17 [id number]

## 2017-03-06 NOTE — ED Provider Notes (Signed)
[facility] Provider Note   CSN: [id number] Arrival date & time: 03/06/17  1112     History   Chief Complaint Chief Complaint  Patient presents with  . Emesis    HPI Roman Sandall is a 36 y.o. female.  Pt w PMHx DM, presents w vomiting, diarrhea, chills since 2am this morning. Pt reports She woke up this morning and began vomiting w diarrhea and diffuse crampy abdl pains intermittently. Denies hematemesis or hematochezia, dysuria, SOB, CP. Reports she is on sliding scale insulin; she took her novolog this morning but not levemir or [place]. She did not check her BG this AM.      Past Medical History:  Diagnosis Date  . Diabetes mellitus without complication (HCC)    dx age 48  . Fatty liver disease, nonalcoholic   . Macular degeneration     Patient Active Problem List   Diagnosis Date Noted  . 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  . Osteomyelitis (HCC) 12/13/2016  . Diabetic foot ulcer (HCC) 10/15/2016  . Nicotine dependence 10/15/2016  . Cellulitis of toe of right foot 10/07/2016  . Uncontrolled type 2 diabetes mellitus with complication (HCC) 09/09/2016  . Morbid obesity (HCC) 09/09/2016  . Diabetic polyneuropathy associated with type 2 diabetes mellitus (HCC) 09/09/2016    Past Surgical History:  Procedure Laterality Date  . AMPUTATION TOE Right 12/16/2016   Procedure: RIGHT FIRST TOE AMPUTATION;  Surgeon: Ancil Linsey, MD;  Location: [facility];  Service: General;  Laterality: Right;  Right first toe amputation for osteomyelitis    OB History    Gravida Para Term Preterm AB Living   0 0 0 0 0 0   SAB TAB Ectopic Multiple Live Births   0 0 0 0 0       Home Medications    Prior to Admission medications   Medication Sig Start Date End Date Taking? Authorizing Provider  albuterol (PROVENTIL HFA;VENTOLIN HFA) 108 (90 Base) MCG/ACT inhaler Inhale 2 puffs into the lungs every 6 (six) hours as needed for wheezing or shortness of breath.   Yes Historical  Provider, MD  benzonatate (TESSALON) 100 MG capsule 1-2 po q 8 hour prn cough Patient taking differently: Take 100-200 mg by mouth every 8 (eight) hours as needed for cough.  01/27/17  Yes Jacquelin Hawking, PA-C  diclofenac (VOLTAREN) 75 MG EC tablet Take 1 tablet (75 mg total) by mouth 2 (two) times daily as needed. 12/11/16  Yes Jacquelin Hawking, PA-C  insulin aspart (NOVOLOG) 100 UNIT/ML injection Inject 15 Units into the skin 3 (three) times daily before meals. 15 units plus sliding scales.   Yes Historical Provider, MD  insulin detemir (LEVEMIR) 100 UNIT/ML injection Inject 55 Units into the skin at bedtime. Reported on 04/16/2016   Yes Historical Provider, MD  lovastatin (MEVACOR) 20 MG tablet Take 1 tablet (20 mg total) by mouth at bedtime. 12/11/16  Yes Jacquelin Hawking, PA-C  metFORMIN (GLUCOPHAGE) 500 MG tablet Take 1 tablet (500 mg total) by mouth 2 (two) times daily with a meal. 02/06/17  Yes Jacquelin Hawking, PA-C  sitaGLIPtin (JANUVIA) 100 MG tablet Take 1 tablet (100 mg total) by mouth daily. 01/01/17  Yes Jacquelin Hawking, PA-C  traMADol (ULTRAM) 50 MG tablet Take 1 tablet (50 mg total) by mouth every 6 (six) hours as needed. 09/04/16  Yes Burgess Amor, PA-C    Family History Family History  Problem Relation Age of Onset  . Diabetes Father     Social History Social History  Substance Use Topics  . Smoking status: Former Smoker    Packs/day: 0.25    Types: Cigarettes    Quit date: 12/11/2016  . Smokeless tobacco: Never Used  . Alcohol use No     Allergies   Shellfish allergy and Percocet [oxycodone-acetaminophen]   Review of Systems Review of Systems  Constitutional: Positive for chills. Negative for fever.  Respiratory: Negative for shortness of breath.   Gastrointestinal: Positive for abdominal pain (crampy, diffuse, intermittent), diarrhea, nausea and vomiting. Negative for blood in stool and constipation.       No hematemesis  Genitourinary: Negative for dysuria.    Neurological: Negative for light-headedness.  All other systems reviewed and are negative.    Physical Exam Updated Vital Signs BP (!) 108/54 (BP Location: Left Arm)   Pulse 91   Temp 97.7 F (36.5 C) (Oral)   Resp 18   Ht  (1.676 m)   Wt 136.1 kg   LMP 02/25/2017   SpO2 100%   BMI 48.42 kg/m   Physical Exam  Constitutional: She is oriented to person, place, and time. She appears well-developed and well-nourished. No distress.  HENT:  Head: Normocephalic and atraumatic.  Eyes: Conjunctivae are normal.  Neck: Normal range of motion.  Cardiovascular: Normal rate, regular rhythm, normal heart sounds and intact distal pulses.   No murmur heard. Pulmonary/Chest: Effort normal and breath sounds normal. No respiratory distress. She has no wheezes.  Abdominal: Soft. There is tenderness (diffuse mild TTP). There is no rebound and no guarding.  Hyperactive BS  Musculoskeletal: Normal range of motion.  Neurological: She is alert and oriented to person, place, and time.  Skin: Skin is warm and dry.  Psychiatric: She has a normal mood and affect. Her behavior is normal.  Nursing note and vitals reviewed.    ED Treatments / Results  Labs (all labs ordered are listed, but only abnormal results are displayed) Labs Reviewed  COMPREHENSIVE METABOLIC PANEL - Abnormal; Notable for the following:       Result Value   Sodium 133 (*)    Glucose, Bld 301 (*)    Calcium 8.5 (*)    Albumin 3.4 (*)    Total Bilirubin 0.2 (*)    All other components within normal limits  CBC - Abnormal; Notable for the following:    MCH 25.7 (*)    All other components within normal limits  URINALYSIS, ROUTINE W REFLEX MICROSCOPIC - Abnormal; Notable for the following:    APPearance HAZY (*)    Glucose, UA 50 (*)    Protein, ur 100 (*)    Bacteria, UA RARE (*)    Squamous Epithelial / LPF 6-30 (*)    All other components within normal limits  LIPASE, BLOOD    EKG  EKG Interpretation None        Radiology No results found.  Procedures Procedures (including critical care time)  Medications Ordered in ED Medications  ondansetron (ZOFRAN) injection 4 mg (4 mg Intravenous Given 03/06/17 1229)  promethazine (PHENERGAN) injection 25 mg (25 mg Intramuscular Given 03/06/17 1401)     Initial Impression / Assessment and Plan / ED Course  I have reviewed the triage vital signs and the nursing notes.  Pertinent labs & imaging results that were available during my care of the patient were reviewed by me and considered in my medical decision making (see chart for details).  Clinical Course as of Mar 07 1543  Thu Mar 06, 2017  1414 Reassessed  pt; nausea improving w phenergan  [JR]    Clinical Course User Index [JR] [place] N Russo, PA-C    Patient with symptoms consistent with viral gastroenteritis.  Vitals are stable, no fever. CMP, lipase, CBC reassuring. No signs of dehydration, tolerating PO fluids > 6 oz.  Lungs are clear.  No focal abdominal pain, no concern for appendicitis, cholecystitis, pancreatitis, ruptured viscus, UTI, kidney stone, or any other abdominal etiology.  Zofran and phenergan given in ED w improvement nausea. Supportive therapy indicated with return if symptoms worsen.  Pt safe for discharge home.   Patient discussed with and seen by Dr. Hyacinth Meeker. Discussed results, findings, treatment and follow up. Patient advised of return precautions. Patient verbalized understanding and agreed with plan.  Final Clinical Impressions(s) / ED Diagnoses   Final diagnoses:  Gastroenteritis    New Prescriptions New Prescriptions   No medications on file     [place] N Russo, PA-C 03/06/17 1545    Eber Hong, MD 03/07/17 [phone]

## 2017-03-06 NOTE — Discharge Instructions (Signed)
Please read instructions below. Continue drinking water. Monitor your blood sugar and adjust your sliding scale insulin (Levemir) accordingly. Return to ER for worsening symptoms, increased abdominal pain, blood in your vomit or stool.

## 2017-03-10 ENCOUNTER — Other Ambulatory Visit: Payer: Self-pay

## 2017-03-10 DIAGNOSIS — E118 Type 2 diabetes mellitus with unspecified complications: Principal | ICD-10-CM

## 2017-03-10 DIAGNOSIS — E1142 Type 2 diabetes mellitus with diabetic polyneuropathy: Secondary | ICD-10-CM

## 2017-03-10 DIAGNOSIS — E1165 Type 2 diabetes mellitus with hyperglycemia: Secondary | ICD-10-CM

## 2017-03-10 DIAGNOSIS — D649 Anemia, unspecified: Secondary | ICD-10-CM

## 2017-03-18 ENCOUNTER — Ambulatory Visit: Payer: Self-pay | Admitting: Physician Assistant

## 2017-03-18 ENCOUNTER — Encounter: Payer: Self-pay | Admitting: Physician Assistant

## 2017-03-18 VITALS — BP 136/82 | HR 92 | Temp 98.1°F | Ht 66.0 in | Wt 353.5 lb

## 2017-03-18 DIAGNOSIS — IMO0002 Reserved for concepts with insufficient information to code with codable children: Secondary | ICD-10-CM

## 2017-03-18 DIAGNOSIS — Z899 Acquired absence of limb, unspecified: Secondary | ICD-10-CM

## 2017-03-18 DIAGNOSIS — Z794 Long term (current) use of insulin: Principal | ICD-10-CM

## 2017-03-18 DIAGNOSIS — E11319 Type 2 diabetes mellitus with unspecified diabetic retinopathy without macular edema: Secondary | ICD-10-CM

## 2017-03-18 DIAGNOSIS — E1142 Type 2 diabetes mellitus with diabetic polyneuropathy: Secondary | ICD-10-CM

## 2017-03-18 DIAGNOSIS — E1165 Type 2 diabetes mellitus with hyperglycemia: Secondary | ICD-10-CM

## 2017-03-18 DIAGNOSIS — E113523 Type 2 diabetes mellitus with proliferative diabetic retinopathy with traction retinal detachment involving the macula, bilateral: Secondary | ICD-10-CM | POA: Insufficient documentation

## 2017-03-18 DIAGNOSIS — E118 Type 2 diabetes mellitus with unspecified complications: Principal | ICD-10-CM

## 2017-03-18 DIAGNOSIS — F39 Unspecified mood [affective] disorder: Secondary | ICD-10-CM

## 2017-03-18 MED ORDER — GABAPENTIN 300 MG PO CAPS: 300.0000 mg | ORAL_CAPSULE | Freq: Two times a day (BID) | ORAL | 0 refills | Status: DC

## 2017-03-18 NOTE — Progress Notes (Signed)
BP 136/82 (BP Location: Left Arm, Patient Position: Sitting, Cuff Size: Large)   Pulse 92   Temp 98.1 F (36.7 C) (Other (Comment))   Ht  (1.676 m)   Wt (!) 353 lb 8 oz (160.3 kg)   LMP 02/25/2017   SpO2 98%   BMI 57.06 kg/m    Subjective:    Patient ID: Rachel George, female    DOB: 1981-07-04, 36 y.o.   MRN: [id number]  HPI: [name] is a 36 y.o. female presenting on 03/18/2017 for Diabetes   HPI   Pt c/o eye problems.  she Also c/o numbness and pain in LUE.    Pt is hoping to get insurance through her job soon.    She went to my eye doctor in march. She says she has vision insurance.  She has left message at [facility] to get financial assitanct to get to eye specialist. She was given phone number for [facility] financial counselor at her appointment here in March.  She says her urine is improved  She says Her mood is about the same. she says she is working on getting appt with [facility] but she is also looking at [facility] as a possibility.  Pt forgot to get her blood drawn.  Reviewed bs log.  Max 140, low 90.   Relevant past medical, surgical, family and social history reviewed and updated as indicated. Interim medical history since our last visit reviewed. Allergies and medications reviewed and updated.   Current Outpatient Prescriptions:  .  albuterol (PROVENTIL HFA;VENTOLIN HFA) 108 (90 Base) MCG/ACT inhaler, Inhale 2 puffs into the lungs every 6 (six) hours as needed for wheezing or shortness of breath., Disp: , Rfl:  .  insulin aspart (NOVOLOG) 100 UNIT/ML injection, Inject 15 Units into the skin 3 (three) times daily before meals. 15 units plus sliding scales., Disp: , Rfl:  .  insulin detemir (LEVEMIR) 100 UNIT/ML injection, Inject 55 Units into the skin at bedtime. Reported on 04/16/2016, Disp: , Rfl:  .  lovastatin (MEVACOR) 20 MG tablet, Take 1 tablet (20 mg total) by mouth at bedtime., Disp: 30 tablet, Rfl: 3 .  metFORMIN (GLUCOPHAGE) 500 MG tablet,  Take 1 tablet (500 mg total) by mouth 2 (two) times daily with a meal., Disp: 180 tablet, Rfl: 1 .  sitaGLIPtin (JANUVIA) 100 MG tablet, Take 1 tablet (100 mg total) by mouth daily., Disp: 90 tablet, Rfl: 2 .  diclofenac (VOLTAREN) 75 MG EC tablet, Take 1 tablet (75 mg total) by mouth 2 (two) times daily as needed. (Patient not taking: Reported on 03/18/2017), Disp: 60 tablet, Rfl: 0 .  traMADol (ULTRAM) 50 MG tablet, Take 1 tablet (50 mg total) by mouth every 6 (six) hours as needed. (Patient not taking: Reported on 03/18/2017), Disp: 15 tablet, Rfl: 0   Review of Systems  Constitutional: Positive for fatigue. Negative for appetite change, chills, diaphoresis, fever and unexpected weight change.  HENT: Positive for dental problem, ear pain and sneezing. Negative for congestion, drooling, facial swelling, hearing loss, mouth sores, sore throat, trouble swallowing and voice change.   Eyes: Positive for pain, discharge, redness, itching and visual disturbance.  Respiratory: Positive for cough. Negative for choking, shortness of breath and wheezing.   Cardiovascular: Negative for chest pain, palpitations and leg swelling.  Gastrointestinal: Negative for abdominal pain, blood in stool, constipation, diarrhea and vomiting.  Endocrine: Negative for cold intolerance, heat intolerance and polydipsia.  Genitourinary: Negative for decreased urine volume, dysuria and hematuria.  Musculoskeletal: Positive for arthralgias and gait problem. Negative for back pain.  Skin: Negative for rash.  Allergic/Immunologic: Positive for environmental allergies.  Neurological: Positive for light-headedness and headaches. Negative for seizures and syncope.  Hematological: Negative for adenopathy.  Psychiatric/Behavioral: Positive for agitation and dysphoric mood. Negative for suicidal ideas. The patient is nervous/anxious.     Per HPI unless specifically indicated above     Objective:    BP 136/82 (BP Location: Left  Arm, Patient Position: Sitting, Cuff Size: Large)   Pulse 92   Temp 98.1 F (36.7 C) (Other (Comment))   Ht  (1.676 m)   Wt (!) 353 lb 8 oz (160.3 kg)   LMP 02/25/2017   SpO2 98%   BMI 57.06 kg/m   Wt Readings from Last 3 Encounters:  03/18/17 (!) 353 lb 8 oz (160.3 kg)  03/06/17 300 lb (136.1 kg)  02/18/17 (!) 351 lb 8 oz (159.4 kg)    Physical Exam  Constitutional: She is oriented to person, place, and time. She appears well-developed and well-nourished.  HENT:  Head: Normocephalic and atraumatic.  Neck: Neck supple.  Cardiovascular: Normal rate and regular rhythm.   Pulmonary/Chest: Effort normal and breath sounds normal.  Abdominal: Soft. Bowel sounds are normal. She exhibits no mass. There is no hepatosplenomegaly. There is no tenderness.  Musculoskeletal: She exhibits no edema (no edema).  Lymphadenopathy:    She has no cervical adenopathy.  Neurological: She is alert and oriented to person, place, and time.  Skin: Skin is warm and dry.  Feet skin intact with no ulcers.  There is no scab or oozing at site of R great toe amputation.   Psychiatric: She has a normal mood and affect. Her behavior is normal.  Vitals reviewed.       Assessment & Plan:   Encounter Diagnoses  Name Primary?  Marland Kitchen Uncontrolled type 2 diabetes mellitus with complication, with long-term current use of insulin (HCC) Yes  . Diabetic polyneuropathy associated with type 2 diabetes mellitus (HCC)   . Mood disorder (HCC)   . S/P amputation   . Diabetic retinopathy associated with type 2 diabetes mellitus, macular edema presence unspecified, unspecified laterality, unspecified retinopathy severity (HCC)   . Morbid obesity (HCC)     -pt to gET LABS DRAWN TODAY!!! -will Add gabapentin for neuropathy -Pt has appointment with podiatrist next week. -Pt counseled to continue working to get her appointment with eye specialist at [facility] -encouraged pt to get appointment with [facility] specialist -pt to follow  up in 1 month to discuss a1c.  Discussed that if her a1c hasn't gotten close to goal, will refer to endocrinology

## 2017-03-18 NOTE — Patient Instructions (Addendum)
[facility] Financial Counselor- [id number]    Financial Counseling- [phone] or [phone]

## 2017-03-24 ENCOUNTER — Ambulatory Visit: Payer: No Typology Code available for payment source | Admitting: Podiatry

## 2017-03-31 ENCOUNTER — Ambulatory Visit: Payer: No Typology Code available for payment source | Admitting: Podiatry

## 2017-04-16 ENCOUNTER — Ambulatory Visit: Payer: Self-pay | Admitting: Physician Assistant

## 2017-04-17 ENCOUNTER — Ambulatory Visit: Payer: Self-pay | Admitting: Physician Assistant

## 2017-04-17 ENCOUNTER — Other Ambulatory Visit (HOSPITAL_COMMUNITY)
Admission: RE | Admit: 2017-04-17 | Discharge: 2017-04-17 | Disposition: A | Discharge status: Home / Self Care | Payer: Self-pay | Source: Ambulatory Visit | Attending: Physician Assistant | Admitting: Physician Assistant

## 2017-04-17 ENCOUNTER — Encounter: Payer: Self-pay | Admitting: Physician Assistant

## 2017-04-17 VITALS — BP 128/70 | HR 90 | Temp 97.5°F | Ht 66.0 in | Wt 350.2 lb

## 2017-04-17 DIAGNOSIS — Z794 Long term (current) use of insulin: Secondary | ICD-10-CM

## 2017-04-17 DIAGNOSIS — E118 Type 2 diabetes mellitus with unspecified complications: Secondary | ICD-10-CM

## 2017-04-17 DIAGNOSIS — R197 Diarrhea, unspecified: Secondary | ICD-10-CM

## 2017-04-17 DIAGNOSIS — Z9119 Patient's noncompliance with other medical treatment and regimen: Secondary | ICD-10-CM

## 2017-04-17 DIAGNOSIS — L602 Onychogryphosis: Secondary | ICD-10-CM

## 2017-04-17 DIAGNOSIS — IMO0002 Reserved for concepts with insufficient information to code with codable children: Secondary | ICD-10-CM

## 2017-04-17 DIAGNOSIS — R350 Frequency of micturition: Secondary | ICD-10-CM

## 2017-04-17 DIAGNOSIS — E1142 Type 2 diabetes mellitus with diabetic polyneuropathy: Secondary | ICD-10-CM

## 2017-04-17 DIAGNOSIS — E1165 Type 2 diabetes mellitus with hyperglycemia: Secondary | ICD-10-CM

## 2017-04-17 DIAGNOSIS — Z91199 Patient's noncompliance with other medical treatment and regimen due to unspecified reason: Secondary | ICD-10-CM

## 2017-04-17 LAB — COMPREHENSIVE METABOLIC PANEL
ALBUMIN: 3.4 g/dL — AB (ref 3.5–5.0)
ALK PHOS: 96 U/L (ref 38–126)
ALT: 16 U/L (ref 14–54)
ANION GAP: 5 (ref 5–15)
AST: 16 U/L (ref 15–41)
BILIRUBIN TOTAL: 0.4 mg/dL (ref 0.3–1.2)
BUN: 19 mg/dL (ref 6–20)
CALCIUM: 9.1 mg/dL (ref 8.9–10.3)
CO2: 25 mmol/L (ref 22–32)
Chloride: 102 mmol/L (ref 101–111)
Creatinine, Ser: 0.92 mg/dL (ref 0.44–1.00)
GFR calc non Af Amer: >60 mL/min (ref >60)
GLUCOSE: 399 mg/dL — AB (ref 65–99)
POTASSIUM: 4.6 mmol/L (ref 3.5–5.1)
SODIUM: 132 mmol/L — AB (ref 135–145)
Total Protein: 7.1 g/dL (ref 6.5–8.1)

## 2017-04-17 LAB — POCT URINALYSIS DIPSTICK
BILIRUBIN UA: NEGATIVE
Ketones, UA: NEGATIVE
Leukocytes, UA: NEGATIVE
Nitrite, UA: NEGATIVE
PH UA: 5.5 (ref 5.0–8.0)
Protein, UA: 100
SPEC GRAV UA: 1.02 (ref 1.010–1.025)
UROBILINOGEN UA: 0.2 U/dL

## 2017-04-17 NOTE — Progress Notes (Signed)
BP 128/70 (BP Location: Left Arm, Patient Position: Sitting, Cuff Size: Large)   Pulse 90   Temp 97.5 F (36.4 C)   Ht 5\' 6"  (1.676 m)   Wt (!) 350 lb 4 oz (158.9 kg)   SpO2 99%   BMI 56.53 kg/m    Subjective:    Patient ID: Rachel George, female    DOB: 07-01-1981, 36 y.o.   MRN: [id number]  HPI: Rachel George is a 36 y.o. female presenting on 04/17/2017 for Diarrhea (pt believes gabapentin caused diarrhea, pt states also having vomitting) and Urinary Frequency   HPI   Chief Complaint  Patient presents with  . Diarrhea    pt believes gabapentin caused diarrhea, pt states also having vomitting  . Urinary Frequency     Pt again did NOT get labs (a1c) drawn after last office visit as instructed.    Pt says she has dental and eye insurance but not yet medical.    Pt says GI symptoms after she has tried the gabapentin.  She took it twice.  She has not yet called to get appointment with mental health.   She says she has appt with podiatry the end of this month  Relevant past medical, surgical, family and social history reviewed and updated as indicated. Interim medical history since our last visit reviewed. Allergies and medications reviewed and updated.   Current Outpatient Prescriptions:  .  Carboxymethylcellulose Sodium (LUBRICANT EYE DROPS OP), Apply to eye., Disp: , Rfl:  .  insulin aspart (NOVOLOG) 100 UNIT/ML injection, Inject 15 Units into the skin 3 (three) times daily before meals. 15 units plus sliding scales., Disp: , Rfl:  .  insulin detemir (LEVEMIR) 100 UNIT/ML injection, Inject 55 Units into the skin at bedtime. Reported on 04/16/2016, Disp: , Rfl:  .  metFORMIN (GLUCOPHAGE) 500 MG tablet, Take 1 tablet (500 mg total) by mouth 2 (two) times daily with a meal., Disp: 180 tablet, Rfl: 1 .  sitaGLIPtin (JANUVIA) 100 MG tablet, Take 1 tablet (100 mg total) by mouth daily., Disp: 90 tablet, Rfl: 2 .  albuterol (PROVENTIL HFA;VENTOLIN HFA) 108 (90 Base)  MCG/ACT inhaler, Inhale 2 puffs into the lungs every 6 (six) hours as needed for wheezing or shortness of breath., Disp: , Rfl:  .  diclofenac (VOLTAREN) 75 MG EC tablet, Take 1 tablet (75 mg total) by mouth 2 (two) times daily as needed. (Patient not taking: Reported on 03/18/2017), Disp: 60 tablet, Rfl: 0 .  gabapentin (NEURONTIN) 300 MG capsule, Take 1 capsule (300 mg total) by mouth 2 (two) times daily. (Patient not taking: Reported on 04/17/2017), Disp: 180 capsule, Rfl: 0 .  lovastatin (MEVACOR) 20 MG tablet, Take 1 tablet (20 mg total) by mouth at bedtime. (Patient not taking: Reported on 04/17/2017), Disp: 30 tablet, Rfl: 3 .  traMADol (ULTRAM) 50 MG tablet, Take 1 tablet (50 mg total) by mouth every 6 (six) hours as needed. (Patient not taking: Reported on 03/18/2017), Disp: 15 tablet, Rfl: 0  Review of Systems  Constitutional: Positive for appetite change and fatigue. Negative for chills, diaphoresis, fever and unexpected weight change.  HENT: Positive for sneezing. Negative for congestion, dental problem, drooling, ear pain, facial swelling, hearing loss, mouth sores, sore throat, trouble swallowing and voice change.   Eyes: Positive for pain, discharge, redness, itching and visual disturbance.  Respiratory: Negative for cough, choking, shortness of breath and wheezing.   Cardiovascular: Negative for chest pain, palpitations and leg swelling.  Gastrointestinal: Positive for abdominal  pain, blood in stool, diarrhea and vomiting. Negative for constipation.  Endocrine: Positive for heat intolerance. Negative for cold intolerance and polydipsia.  Genitourinary: Positive for dysuria. Negative for decreased urine volume and hematuria.  Musculoskeletal: Positive for arthralgias and gait problem. Negative for back pain.  Skin: Negative for rash.  Allergic/Immunologic: Positive for environmental allergies.  Neurological: Positive for light-headedness and headaches. Negative for seizures and syncope.   Hematological: Negative for adenopathy.  Psychiatric/Behavioral: Positive for agitation and dysphoric mood. Negative for suicidal ideas. The patient is nervous/anxious.     Per HPI unless specifically indicated above     Objective:    BP 128/70 (BP Location: Left Arm, Patient Position: Sitting, Cuff Size: Large)   Pulse 90   Temp 97.5 F (36.4 C)   Ht 5\' 6"  (1.676 m)   Wt (!) 350 lb 4 oz (158.9 kg)   SpO2 99%   BMI 56.53 kg/m   Wt Readings from Last 3 Encounters:  04/17/17 (!) 350 lb 4 oz (158.9 kg)  03/18/17 (!) 353 lb 8 oz (160.3 kg)  03/06/17 300 lb (136.1 kg)    Physical Exam  Constitutional: She is oriented to person, place, and time. She appears well-developed and well-nourished.  HENT:  Head: Normocephalic and atraumatic.  Neck: Neck supple.  Cardiovascular: Normal rate and regular rhythm.   Pulmonary/Chest: Effort normal and breath sounds normal.  Abdominal: Soft. Bowel sounds are normal. She exhibits no distension and no mass. There is no hepatosplenomegaly. There is generalized tenderness. There is no rigidity, no rebound, no guarding and no CVA tenderness.  Abdomen obese.  Very mild diffuse tenderness. No rebound or guarding.   Musculoskeletal: She exhibits no edema.  Lymphadenopathy:    She has no cervical adenopathy.  Neurological: She is alert and oriented to person, place, and time.  Skin: Skin is warm and dry.  Pt not wearing socks.  Nails thick and long. No open wounds or signs infection of the feet.   Psychiatric: She has a normal mood and affect. Her behavior is normal.  Vitals reviewed.   UA reviewed    Assessment & Plan:   Encounter Diagnoses  Name Primary?  . Diarrhea, unspecified type Yes  . Urinary frequency   . Thickened nails   . Diabetic polyneuropathy associated with type 2 diabetes mellitus (HCC)   . Uncontrolled type 2 diabetes mellitus with complication, with long-term current use of insulin (HCC)   . Personal history of  noncompliance with medical treatment, presenting hazards to health     -Stop gabapentin.  Suspect GI symptoms not form the gabapentin (pt in ER last month with similar symptoms).  If symptoms persist, may need GI referral --pt Get labs drawn when leaves office today -pt to go to podiatry appointment as scheduled -encouraged her to get in with MH -Pt counseled to wear socks to protect her feet -F/u next week as scheduled

## 2017-04-18 LAB — HEMOGLOBIN A1C
HEMOGLOBIN A1C: 10.5 % — AB (ref 4.8–5.6)
Mean Plasma Glucose: 255 mg/dL

## 2017-04-23 ENCOUNTER — Ambulatory Visit: Payer: Self-pay | Admitting: Physician Assistant

## 2017-04-24 ENCOUNTER — Encounter: Payer: Self-pay | Admitting: Physician Assistant

## 2017-04-24 ENCOUNTER — Ambulatory Visit: Payer: Self-pay | Admitting: Physician Assistant

## 2017-04-24 VITALS — BP 126/60 | HR 91 | Temp 97.7°F | Ht 66.0 in | Wt 348.2 lb

## 2017-04-24 DIAGNOSIS — E1165 Type 2 diabetes mellitus with hyperglycemia: Secondary | ICD-10-CM

## 2017-04-24 DIAGNOSIS — Z899 Acquired absence of limb, unspecified: Secondary | ICD-10-CM

## 2017-04-24 DIAGNOSIS — E11319 Type 2 diabetes mellitus with unspecified diabetic retinopathy without macular edema: Secondary | ICD-10-CM

## 2017-04-24 DIAGNOSIS — IMO0002 Reserved for concepts with insufficient information to code with codable children: Secondary | ICD-10-CM

## 2017-04-24 DIAGNOSIS — E1142 Type 2 diabetes mellitus with diabetic polyneuropathy: Secondary | ICD-10-CM

## 2017-04-24 DIAGNOSIS — F39 Unspecified mood [affective] disorder: Secondary | ICD-10-CM

## 2017-04-24 DIAGNOSIS — E118 Type 2 diabetes mellitus with unspecified complications: Principal | ICD-10-CM

## 2017-04-24 DIAGNOSIS — Z794 Long term (current) use of insulin: Principal | ICD-10-CM

## 2017-04-24 DIAGNOSIS — L602 Onychogryphosis: Secondary | ICD-10-CM

## 2017-04-24 MED ORDER — LOVASTATIN 20 MG PO TABS: 20.0000 mg | ORAL_TABLET | Freq: Every day | ORAL | 3 refills | Status: DC

## 2017-04-24 NOTE — Progress Notes (Signed)
BP 126/60 (BP Location: Left Arm, Patient Position: Sitting, Cuff Size: Large)   Pulse 91   Temp 97.7 F (36.5 C)   Ht 5\' 6"  (1.676 m)   Wt (!) 348 lb 4 oz (158 kg)   SpO2 98%   BMI 56.21 kg/m    Subjective:    Patient ID: Rachel George, female    DOB: 05/02/1981, 36 y.o.   MRN: [id number]  HPI: Rachel George is a 36 y.o. female presenting on 04/24/2017 for Diabetes and Hyperlipidemia   HPI Her podiatry appt is nex week  She did not call MH  Pt says she got her labs drawn.   She checks her sugar at home. In am usually 180 or 190.  At night she says it's a bit lower like 130 or 140.    Lipids not drawn b/c pt needed to get her blood drawn immediately after appointment due to being sick and no ability to go the next morning due to work.    Pt states she has [employer]- she got letter in feb but doesn't remember date it good until  Pt says her foot is hurting her some  Relevant past medical, surgical, family and social history reviewed and updated as indicated. Interim medical history since our last visit reviewed. Allergies and medications reviewed and updated.    Current Outpatient Prescriptions:  .  albuterol (PROVENTIL HFA;VENTOLIN HFA) 108 (90 Base) MCG/ACT inhaler, Inhale 2 puffs into the lungs every 6 (six) hours as needed for wheezing or shortness of breath., Disp: , Rfl:  .  Carboxymethylcellulose Sodium (LUBRICANT EYE DROPS OP), Apply 1 drop to eye as needed. , Disp: , Rfl:  .  diclofenac (VOLTAREN) 75 MG EC tablet, Take 1 tablet (75 mg total) by mouth 2 (two) times daily as needed., Disp: 60 tablet, Rfl: 0 .  insulin aspart (NOVOLOG) 100 UNIT/ML injection, Inject 15 Units into the skin 3 (three) times daily before meals. 15 units plus sliding scales., Disp: , Rfl:  .  insulin detemir (LEVEMIR) 100 UNIT/ML injection, Inject 55 Units into the skin at bedtime. Reported on 04/16/2016, Disp: , Rfl:  .  metFORMIN (GLUCOPHAGE) 500 MG tablet, Take 1 tablet (500 mg  total) by mouth 2 (two) times daily with a meal., Disp: 180 tablet, Rfl: 1 .  sitaGLIPtin (JANUVIA) 100 MG tablet, Take 1 tablet (100 mg total) by mouth daily., Disp: 90 tablet, Rfl: 2 .  traMADol (ULTRAM) 50 MG tablet, Take 1 tablet (50 mg total) by mouth every 6 (six) hours as needed., Disp: 15 tablet, Rfl: 0 .  gabapentin (NEURONTIN) 300 MG capsule, Take 1 capsule (300 mg total) by mouth 2 (two) times daily. (Patient not taking: Reported on 04/24/2017), Disp: 180 capsule, Rfl: 0 .  lovastatin (MEVACOR) 20 MG tablet, Take 1 tablet (20 mg total) by mouth at bedtime. (Patient not taking: Reported on 04/24/2017), Disp: 30 tablet, Rfl: 3  Review of Systems  Constitutional: Positive for diaphoresis and fatigue. Negative for appetite change, chills, fever and unexpected weight change.  HENT: Positive for sneezing, sore throat, trouble swallowing and voice change. Negative for congestion, drooling, ear pain, facial swelling, hearing loss and mouth sores.   Eyes: Positive for pain, discharge, redness, itching and visual disturbance.  Respiratory: Negative for cough, choking, shortness of breath and wheezing.   Cardiovascular: Negative for chest pain, palpitations and leg swelling.  Gastrointestinal: Negative for abdominal pain, blood in stool, constipation, diarrhea and vomiting.  Endocrine: Negative for cold intolerance,  heat intolerance and polydipsia.  Genitourinary: Negative for decreased urine volume, dysuria and hematuria.  Musculoskeletal: Positive for arthralgias and gait problem. Negative for back pain.  Skin: Negative for rash.  Allergic/Immunologic: Positive for environmental allergies.  Neurological: Positive for headaches. Negative for seizures, syncope and light-headedness.  Hematological: Negative for adenopathy.  Psychiatric/Behavioral: Positive for agitation and dysphoric mood. Negative for suicidal ideas. The patient is nervous/anxious.     Per HPI unless specifically indicated  above     Objective:    BP 126/60 (BP Location: Left Arm, Patient Position: Sitting, Cuff Size: Large)   Pulse 91   Temp 97.7 F (36.5 C)   Ht 5\' 6"  (1.676 m)   Wt (!) 348 lb 4 oz (158 kg)   SpO2 98%   BMI 56.21 kg/m   Wt Readings from Last 3 Encounters:  04/24/17 (!) 348 lb 4 oz (158 kg)  04/17/17 (!) 350 lb 4 oz (158.9 kg)  03/18/17 (!) 353 lb 8 oz (160.3 kg)    Physical Exam  Constitutional: She is oriented to person, place, and time. She appears well-developed and well-nourished.  HENT:  Head: Normocephalic and atraumatic.  Neck: Neck supple.  Cardiovascular: Normal rate and regular rhythm.   Pulmonary/Chest: Effort normal and breath sounds normal. No respiratory distress. She has no wheezes. She has no rales.  Abdominal: Soft. Bowel sounds are normal. She exhibits no mass. There is no hepatosplenomegaly. There is no tenderness.  Musculoskeletal: She exhibits no edema.  Feet with thickened nails, no ulceration.  R foot with some widening like possibly some tarsal bones shifted medially.  Area nontender.   Lymphadenopathy:    She has no cervical adenopathy.  Neurological: She is alert and oriented to person, place, and time.  Skin: Skin is warm and dry.  Psychiatric: She has a normal mood and affect. Her behavior is normal.  Vitals reviewed.   Results for orders placed or performed during the hospital encounter of 04/17/17  Comprehensive metabolic panel  Result Value Ref Range   Sodium 132 (L) 135 - 145 mmol/L   Potassium 4.6 3.5 - 5.1 mmol/L   Chloride 102 101 - 111 mmol/L   CO2 25 22 - 32 mmol/L   Glucose, Bld 399 (H) 65 - 99 mg/dL   BUN 19 6 - 20 mg/dL   Creatinine, Ser 1.470.92 0.44 - 1.00 mg/dL   Calcium 9.1 8.9 - 82.910.3 mg/dL   Total Protein 7.1 6.5 - 8.1 g/dL   Albumin 3.4 (L) 3.5 - 5.0 g/dL   AST 16 15 - 41 U/L   ALT 16 14 - 54 U/L   Alkaline Phosphatase 96 38 - 126 U/L   Total Bilirubin 0.4 0.3 - 1.2 mg/dL   GFR calc non Af Amer >60 >60 mL/min   GFR calc  Af Amer >60 >60 mL/min   Anion gap 5 5 - 15  Hemoglobin A1c  Result Value Ref Range   Hgb A1c MFr Bld 10.5 (H) 4.8 - 5.6 %   Mean Plasma Glucose 255 mg/dL      Assessment & Plan:    Encounter Diagnoses  Name Primary?  Marland Kitchen. Uncontrolled type 2 diabetes mellitus with complication, with long-term current use of insulin (HCC) Yes  . Diabetic polyneuropathy associated with type 2 diabetes mellitus (HCC)   . Diabetic retinopathy associated with type 2 diabetes mellitus, macular edema presence unspecified, unspecified laterality, unspecified retinopathy severity (HCC)   . S/P amputation   . Thickened nails   .  Mood disorder (HCC)   . Morbid obesity (HCC)      -Restart lovastatin  -Refer to endocrine for uncontrolled diabetest -Podiatrist next week as scheduled -Increase levemir to 58u . Pt counseled to take bs log with her to endocrinology appointment -Pt expecting call back from [facility] about eye specialist -Restart gabapenitn.  Pt counseled to take it qd for several days and if tolerating it she can increase to bid -Pt to call MH  -counseled pt about footwear  -F/u 1 month.  RTO sooner pnr

## 2017-04-24 NOTE — Patient Instructions (Signed)
Financial assistance- [phone] (T/Th- [phone])

## 2017-05-01 ENCOUNTER — Emergency Department (HOSPITAL_COMMUNITY): Payer: Self-pay

## 2017-05-01 ENCOUNTER — Observation Stay (HOSPITAL_COMMUNITY)
Admission: EM | Admit: 2017-05-01 | Discharge: 2017-05-02 | Disposition: A | Discharge status: Home / Self Care | Payer: Self-pay | Attending: Internal Medicine | Admitting: Internal Medicine

## 2017-05-01 ENCOUNTER — Encounter (HOSPITAL_COMMUNITY): Payer: Self-pay | Admitting: *Deleted

## 2017-05-01 DIAGNOSIS — L97419 Non-pressure chronic ulcer of right heel and midfoot with unspecified severity: Secondary | ICD-10-CM | POA: Insufficient documentation

## 2017-05-01 DIAGNOSIS — Z79899 Other long term (current) drug therapy: Secondary | ICD-10-CM | POA: Insufficient documentation

## 2017-05-01 DIAGNOSIS — E1169 Type 2 diabetes mellitus with other specified complication: Principal | ICD-10-CM | POA: Insufficient documentation

## 2017-05-01 DIAGNOSIS — E66813 Obesity, class 3: Secondary | ICD-10-CM

## 2017-05-01 DIAGNOSIS — M89571 Osteolysis, right ankle and foot: Secondary | ICD-10-CM | POA: Diagnosis present

## 2017-05-01 DIAGNOSIS — E1142 Type 2 diabetes mellitus with diabetic polyneuropathy: Secondary | ICD-10-CM | POA: Diagnosis present

## 2017-05-01 DIAGNOSIS — Z794 Long term (current) use of insulin: Secondary | ICD-10-CM

## 2017-05-01 DIAGNOSIS — E11621 Type 2 diabetes mellitus with foot ulcer: Secondary | ICD-10-CM | POA: Insufficient documentation

## 2017-05-01 DIAGNOSIS — M948X9 Other specified disorders of cartilage, unspecified sites: Secondary | ICD-10-CM

## 2017-05-01 DIAGNOSIS — E11610 Type 2 diabetes mellitus with diabetic neuropathic arthropathy: Secondary | ICD-10-CM

## 2017-05-01 DIAGNOSIS — R739 Hyperglycemia, unspecified: Secondary | ICD-10-CM

## 2017-05-01 DIAGNOSIS — Z6841 Body Mass Index (BMI) 40.0 and over, adult: Secondary | ICD-10-CM

## 2017-05-01 DIAGNOSIS — M79604 Pain in right leg: Secondary | ICD-10-CM

## 2017-05-01 DIAGNOSIS — Z87891 Personal history of nicotine dependence: Secondary | ICD-10-CM | POA: Insufficient documentation

## 2017-05-01 DIAGNOSIS — M86171 Other acute osteomyelitis, right ankle and foot: Secondary | ICD-10-CM | POA: Insufficient documentation

## 2017-05-01 DIAGNOSIS — E1165 Type 2 diabetes mellitus with hyperglycemia: Secondary | ICD-10-CM

## 2017-05-01 DIAGNOSIS — E1149 Type 2 diabetes mellitus with other diabetic neurological complication: Secondary | ICD-10-CM

## 2017-05-01 HISTORY — DX: Obesity, class 3: E66.813

## 2017-05-01 HISTORY — DX: Body Mass Index (BMI) 40.0 and over, adult: Z684

## 2017-05-01 HISTORY — DX: Morbid (severe) obesity due to excess calories: E66.01

## 2017-05-01 HISTORY — DX: Osteolysis, right ankle and foot: M89.571

## 2017-05-01 LAB — GLUCOSE, CAPILLARY: GLUCOSE-CAPILLARY: 131 mg/dL — AB (ref 65–99)

## 2017-05-01 LAB — BASIC METABOLIC PANEL
ANION GAP: 8 (ref 5–15)
BUN: 16 mg/dL (ref 6–20)
CHLORIDE: 104 mmol/L (ref 101–111)
CO2: 26 mmol/L (ref 22–32)
Calcium: 9.1 mg/dL (ref 8.9–10.3)
Creatinine, Ser: 0.75 mg/dL (ref 0.44–1.00)
GFR calc Af Amer: >60 mL/min (ref >60)
Glucose, Bld: 335 mg/dL — ABNORMAL HIGH (ref 65–99)
POTASSIUM: 4.4 mmol/L (ref 3.5–5.1)
Sodium: 138 mmol/L (ref 135–145)

## 2017-05-01 LAB — CBC WITH DIFFERENTIAL/PLATELET
BASOS ABS: 0 10*3/uL (ref 0.0–0.1)
Basophils Relative: 0 %
EOS ABS: 0.1 10*3/uL (ref 0.0–0.7)
EOS PCT: 1 %
HCT: 38.8 % (ref 36.0–46.0)
HEMOGLOBIN: 12.7 g/dL (ref 12.0–15.0)
LYMPHS ABS: 5.3 10*3/uL — AB (ref 0.7–4.0)
LYMPHS PCT: 66 %
MCH: 26.1 pg (ref 26.0–34.0)
MCHC: 32.7 g/dL (ref 30.0–36.0)
MCV: 79.8 fL (ref 78.0–100.0)
Monocytes Absolute: 0.4 10*3/uL (ref 0.1–1.0)
Monocytes Relative: 5 %
NEUTROS PCT: 28 %
Neutro Abs: 2.3 10*3/uL (ref 1.7–7.7)
PLATELETS: 217 10*3/uL (ref 150–400)
RBC: 4.86 MIL/uL (ref 3.87–5.11)
RDW: 14.9 % (ref 11.5–15.5)
WBC: 8.1 10*3/uL (ref 4.0–10.5)

## 2017-05-01 LAB — SEDIMENTATION RATE: Sed Rate: 26 mm/hr — ABNORMAL HIGH (ref 0–22)

## 2017-05-01 MED ORDER — MORPHINE SULFATE (PF) 4 MG/ML IV SOLN
4.0000 mg | Freq: Once | INTRAVENOUS | Status: AC
  Administered 2017-05-01: 4 mg via INTRAVENOUS
  Filled 2017-05-01: qty 1

## 2017-05-01 MED ORDER — INSULIN ASPART 100 UNIT/ML ~~LOC~~ SOLN: 0.0000 [IU] | Freq: Every day | SUBCUTANEOUS | Status: DC

## 2017-05-01 MED ORDER — LINAGLIPTIN 5 MG PO TABS
5.0000 mg | ORAL_TABLET | Freq: Every day | ORAL | Status: DC
  Administered 2017-05-01 – 2017-05-02 (×2): 5 mg via ORAL
  Filled 2017-05-01: qty 1

## 2017-05-01 MED ORDER — ONDANSETRON HCL 4 MG PO TABS
4.0000 mg | ORAL_TABLET | Freq: Four times a day (QID) | ORAL | Status: DC | PRN
  Filled 2017-05-01: qty 1

## 2017-05-01 MED ORDER — INSULIN ASPART 100 UNIT/ML ~~LOC~~ SOLN
0.0000 [IU] | Freq: Three times a day (TID) | SUBCUTANEOUS | Status: DC
  Administered 2017-05-02: 7 [IU] via SUBCUTANEOUS
  Administered 2017-05-02 (×2): 4 [IU] via SUBCUTANEOUS

## 2017-05-01 MED ORDER — TRAMADOL HCL 50 MG PO TABS
50.0000 mg | ORAL_TABLET | Freq: Four times a day (QID) | ORAL | Status: DC | PRN
Start: 2017-05-01 — End: 2017-05-02

## 2017-05-01 MED ORDER — HYDROCODONE-ACETAMINOPHEN 5-325 MG PO TABS
1.0000 | ORAL_TABLET | ORAL | Status: DC | PRN
  Administered 2017-05-01: 1 via ORAL
  Administered 2017-05-02: 2 via ORAL
  Administered 2017-05-02: 1 via ORAL
  Filled 2017-05-01: qty 1
  Filled 2017-05-01: qty 2
  Filled 2017-05-01 (×2): qty 1

## 2017-05-01 MED ORDER — INSULIN ASPART 100 UNIT/ML ~~LOC~~ SOLN
8.0000 [IU] | Freq: Once | SUBCUTANEOUS | Status: AC
  Administered 2017-05-01: 8 [IU] via INTRAVENOUS
  Filled 2017-05-01: qty 1

## 2017-05-01 MED ORDER — SODIUM CHLORIDE 0.9 % IV BOLUS (SEPSIS)
1000.0000 mL | Freq: Once | INTRAVENOUS | Status: AC
  Administered 2017-05-01: 1000 mL via INTRAVENOUS

## 2017-05-01 MED ORDER — HYDROCODONE-ACETAMINOPHEN 5-325 MG PO TABS
1.0000 | ORAL_TABLET | Freq: Once | ORAL | Status: AC
  Administered 2017-05-01: 1 via ORAL
  Filled 2017-05-01: qty 1

## 2017-05-01 MED ORDER — KETOROLAC TROMETHAMINE 30 MG/ML IJ SOLN
30.0000 mg | Freq: Once | INTRAMUSCULAR | Status: AC
  Administered 2017-05-01: 30 mg via INTRAVENOUS
  Filled 2017-05-01: qty 1

## 2017-05-01 MED ORDER — ACETAMINOPHEN 650 MG RE SUPP: 650.0000 mg | Freq: Four times a day (QID) | RECTAL | Status: DC | PRN

## 2017-05-01 MED ORDER — ONDANSETRON HCL 4 MG/2ML IJ SOLN
4.0000 mg | Freq: Once | INTRAMUSCULAR | Status: AC
  Administered 2017-05-01: 4 mg via INTRAVENOUS
  Filled 2017-05-01: qty 2

## 2017-05-01 MED ORDER — ONDANSETRON HCL 4 MG/2ML IJ SOLN
4.0000 mg | Freq: Four times a day (QID) | INTRAMUSCULAR | Status: DC | PRN
  Administered 2017-05-02: 4 mg via INTRAVENOUS
  Filled 2017-05-01: qty 2

## 2017-05-01 MED ORDER — INSULIN DETEMIR 100 UNIT/ML ~~LOC~~ SOLN
40.0000 [IU] | Freq: Every day | SUBCUTANEOUS | Status: DC
  Administered 2017-05-01: 40 [IU] via SUBCUTANEOUS
  Filled 2017-05-01 (×4): qty 0.4

## 2017-05-01 MED ORDER — ACETAMINOPHEN 325 MG PO TABS
650.0000 mg | ORAL_TABLET | Freq: Four times a day (QID) | ORAL | Status: DC | PRN
Start: 2017-05-01 — End: 2017-05-02

## 2017-05-01 MED ORDER — ENOXAPARIN SODIUM 80 MG/0.8ML ~~LOC~~ SOLN
80.0000 mg | SUBCUTANEOUS | Status: DC
  Administered 2017-05-01: 80 mg via SUBCUTANEOUS
  Filled 2017-05-01: qty 0.8

## 2017-05-01 MED ORDER — PRAVASTATIN SODIUM 10 MG PO TABS
20.0000 mg | ORAL_TABLET | Freq: Every day | ORAL | Status: DC
  Administered 2017-05-01: 20 mg via ORAL
  Filled 2017-05-01 (×2): qty 2

## 2017-05-01 NOTE — ED Provider Notes (Signed)
[facility] Provider Note   CSN: [id number] Arrival date & time: 05/01/17  1200     History   Chief Complaint Chief Complaint  Patient presents with  . Ankle Pain    HPI Rachel George is a 35 y.o. female with a history of diabetes and recent osteomyelitis in her right great toe resulting in great toe amputation 5 months ago presenting with increasing pain And swelling along her right midfoot which has worsened over the past 4 days.  She was seen by her PCP within the last 2 weeks and has been referred to podiatry for ongoing foot care and concern for possible early development of Charcot foot but has not had this consult yet.  Her pain is worsened with weightbearing and radiates into her medial ankle.  Patient reports generally feeling unwell the past several days without specific symptoms including no fevers or chills.  She last checked her blood glucose yesterday and it was in the mid 250 range.  She has found no alleviators for her symptoms.   HPI  Past Medical History:  Diagnosis Date  . Diabetes mellitus without complication (HCC)    dx age 51  . Fatty liver disease, nonalcoholic   . Macular degeneration     Patient Active Problem List   Diagnosis Date Noted  . Diabetic retinopathy associated with type 2 diabetes mellitus (HCC) 03/18/2017  . 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  . Osteomyelitis (HCC) 12/13/2016  . Diabetic foot ulcer (HCC) 10/15/2016  . Cellulitis of toe of right foot 10/07/2016  . Uncontrolled type 2 diabetes mellitus with complication (HCC) 09/09/2016  . Morbid obesity (HCC) 09/09/2016  . Diabetic polyneuropathy associated with type 2 diabetes mellitus (HCC) 09/09/2016    Past Surgical History:  Procedure Laterality Date  . AMPUTATION TOE Right 12/16/2016   Procedure: RIGHT FIRST TOE AMPUTATION;  Surgeon: Ancil Linsey, MD;  Location: [facility];  Service: General;  Laterality: Right;  Right first toe amputation for osteomyelitis    OB History    Gravida Para Term Preterm AB Living   0 0 0 0 0 0   SAB TAB Ectopic Multiple Live Births   0 0 0 0 0       Home Medications    Prior to Admission medications   Medication Sig Start Date End Date Taking? Authorizing Provider  albuterol (PROVENTIL HFA;VENTOLIN HFA) 108 (90 Base) MCG/ACT inhaler Inhale 2 puffs into the lungs every 6 (six) hours as needed for wheezing or shortness of breath.    [provider]  Carboxymethylcellulose Sodium (LUBRICANT EYE DROPS OP) Apply 1 drop to eye as needed.     [provider]  diclofenac (VOLTAREN) 75 MG EC tablet Take 1 tablet (75 mg total) by mouth 2 (two) times daily as needed. 12/11/16   Jacquelin Hawking, PA-C  gabapentin (NEURONTIN) 300 MG capsule Take 1 capsule (300 mg total) by mouth 2 (two) times daily. Patient not taking: Reported on 04/24/2017 03/18/17   Jacquelin Hawking, PA-C  insulin aspart (NOVOLOG) 100 UNIT/ML injection Inject 15 Units into the skin 3 (three) times daily before meals. 15 units plus sliding scales.    [provider]  insulin detemir (LEVEMIR) 100 UNIT/ML injection Inject 55 Units into the skin at bedtime. Reported on 04/16/2016    [provider]  lovastatin (MEVACOR) 20 MG tablet Take 1 tablet (20 mg total) by mouth at bedtime. 04/24/17   Jacquelin Hawking, PA-C  metFORMIN (GLUCOPHAGE) 500 MG tablet Take 1 tablet (500 mg  total) by mouth 2 (two) times daily with a meal. 02/06/17   Jacquelin HawkingMcElroy, Shannon, PA-C  sitaGLIPtin (JANUVIA) 100 MG tablet Take 1 tablet (100 mg total) by mouth daily. 01/01/17   Jacquelin HawkingMcElroy, Shannon, PA-C  traMADol (ULTRAM) 50 MG tablet Take 1 tablet (50 mg total) by mouth every 6 (six) hours as needed. 09/04/16   Burgess AmorIdol, Shaylon Gillean, PA-C    Family History Family History  Problem Relation Age of Onset  . Diabetes Father     Social History Social History  Substance Use Topics  . Smoking status: Former Smoker    Packs/day: 0.25    Types: Cigarettes    Quit date: 12/11/2016  . Smokeless  tobacco: Never Used  . Alcohol use No     Allergies   Shellfish allergy and Percocet [oxycodone-acetaminophen]   Review of Systems Review of Systems  Constitutional: Negative for chills and fever.  Musculoskeletal: Positive for arthralgias and joint swelling. Negative for myalgias.  Skin: Negative.   Neurological: Negative for weakness and numbness.     Physical Exam Updated Vital Signs BP 112/71 (BP Location: Left Arm)   Pulse 82   Temp 98.2 F (36.8 C) (Oral)   Resp 16   Ht 5\' 7"  (1.702 m)   Wt (!) 154.2 kg (340 lb)   LMP 04/18/2017   SpO2 100%   BMI 53.25 kg/m   Physical Exam  Constitutional: She appears well-developed and well-nourished.  HENT:  Head: Atraumatic.  Neck: Normal range of motion.  Cardiovascular:  Pulses equal bilaterally  Musculoskeletal: She exhibits tenderness.       Feet:  ttp medial dorsal midfoot with edema. No red streaking. Skin intact.  Well healed amputation incision distal foot.  Neurological: She is alert. She has normal strength. She displays normal reflexes. No sensory deficit.  Skin: Skin is warm and dry.  Psychiatric: She has a normal mood and affect.     ED Treatments / Results  Labs (all labs ordered are listed, but only abnormal results are displayed) Results for orders placed or performed during the hospital encounter of 05/01/17  CBC with Differential  Result Value Ref Range   WBC 8.1 4.0 - 10.5 K/uL   RBC 4.86 3.87 - 5.11 MIL/uL   Hemoglobin 12.7 12.0 - 15.0 g/dL   HCT 16.138.8 09.636.0 - 04.546.0 %   MCV 79.8 78.0 - 100.0 fL   MCH 26.1 26.0 - 34.0 pg   MCHC 32.7 30.0 - 36.0 g/dL   RDW 40.914.9 81.111.5 - 91.415.5 %   Platelets 217 150 - 400 K/uL   Neutrophils Relative % 28 %   Neutro Abs 2.3 1.7 - 7.7 K/uL   Lymphocytes Relative 66 %   Lymphs Abs 5.3 (H) 0.7 - 4.0 K/uL   Monocytes Relative 5 %   Monocytes Absolute 0.4 0.1 - 1.0 K/uL   Eosinophils Relative 1 %   Eosinophils Absolute 0.1 0.0 - 0.7 K/uL   Basophils Relative 0 %    Basophils Absolute 0.0 0.0 - 0.1 K/uL  Basic metabolic panel  Result Value Ref Range   Sodium 138 135 - 145 mmol/L   Potassium 4.4 3.5 - 5.1 mmol/L   Chloride 104 101 - 111 mmol/L   CO2 26 22 - 32 mmol/L   Glucose, Bld 335 (H) 65 - 99 mg/dL   BUN 16 6 - 20 mg/dL   Creatinine, Ser 7.820.75 0.44 - 1.00 mg/dL   Calcium 9.1 8.9 - 95.610.3 mg/dL   GFR calc non Af Amer >  60 >60 mL/min   GFR calc Af Amer >60 >60 mL/min   Anion gap 8 5 - 15      EKG  EKG Interpretation None       Radiology Dg Ankle Complete Right  Result Date: 05/01/2017 CLINICAL DATA:  36 year old diabetic female status post right great toe amputation in January this year. Four days of right foot an ankle pain with no known injury. EXAM: RIGHT ANKLE - COMPLETE 3+ VIEW COMPARISON:  Right foot series today reported separately. FINDINGS: Generalized soft tissue swelling. Preserved mortise joint alignment. No ankle joint effusion. Talar dome intact. Distal tibia, distal fibula, and calcaneus appear intact. Abnormal appearance of the dorsal midfoot as described on the right foot series today. IMPRESSION: 1. No acute fracture or dislocation identified about the right ankle. 2. Abnormal appearance of the dorsal midfoot. See right foot series today reported separately. Electronically Signed   By: Odessa Fleming M.D.   On: 05/01/2017 14:46   Dg Foot Complete Right  Result Date: 05/01/2017 CLINICAL DATA:  36 year old diabetic female status post right great toe amputation in January this year. Four days of right foot an ankle pain with no known injury. EXAM: RIGHT FOOT COMPLETE - 3+ VIEW COMPARISON:  Right foot series 12/13/16. FINDINGS: Interval amputation of the right first ray from the base of the first proximal phalanx distally. The right first MTP joint appears to remain intact. The residual bone fragment of the first proximal phalanx appears intact without suspicious osteolysis. There is mild overlying soft tissue swelling. No subcutaneous gas.  The second through fifth rays appear intact. Tarsal bone alignment is preserved, but there is new dystrophic calcification and regional osteolysis along the dorsal midfoot compared to January (lateral view). There is overlying soft tissue swelling. No subcutaneous gas. Other tarsal structures appear stable and intact. IMPRESSION: 1. Dorsal midfoot osteolysis and new dystrophic calcifications since January highly suspicious for Acute Osteomyelitis (see lateral view). Recommend follow-up right foot MRI (without and with contrast preferred) or three-phase bone scan to evaluate further. 2. Dorsal right foot soft tissue swelling with no subcutaneous gas. 3. Satisfactory postoperative appearance of right first ray amputation. Electronically Signed   By: Odessa Fleming M.D.   On: 05/01/2017 14:45    Procedures Procedures (including critical care time)  Medications Ordered in ED Medications  morphine 4 MG/ML injection 4 mg (not administered)  ondansetron (ZOFRAN) injection 4 mg (not administered)  sodium chloride 0.9 % bolus 1,000 mL (not administered)  insulin aspart (novoLOG) injection 8 Units (not administered)  HYDROcodone-acetaminophen (NORCO/VICODIN) 5-325 MG per tablet 1 tablet (1 tablet Oral Given 05/01/17 1357)  ketorolac (TORADOL) 30 MG/ML injection 30 mg (30 mg Intravenous Given 05/01/17 1557)     Initial Impression / Assessment and Plan / ED Course  I have reviewed the triage vital signs and the nursing notes.  Pertinent labs & imaging results that were available during my care of the patient were reviewed by me and considered in my medical decision making (see chart for details).    Diabetic pt with uncontrolled dm, recent osteomyelitis with resultant toe amputation now with concern for return or new focus of infection mid foot. MRI recommended for further eval.  Pt will need admission for IV abx and further management of this probable infection.  Discussed with hospitalist who will see pt in  ed. And plan for overnight obs admission.  Final Clinical Impressions(s) / ED Diagnoses   Final diagnoses:  Other acute osteomyelitis of right foot (  HCC)  Hyperglycemia    New Prescriptions New Prescriptions   No medications on file     Victoriano Lain 05/01/17 1712    Pricilla Loveless, MD 05/08/17 [phone]

## 2017-05-01 NOTE — ED Triage Notes (Signed)
Pain in right ankle and right knee, states she had surgery on that foot in the past. States she has had pain for the past 4 days in her ankle

## 2017-05-01 NOTE — H&P (Addendum)
History and Physical  Rachel George JIR:[id number] DOB: 11/15/1981 DOA: 05/01/2017   PCP: Soyla Dryer, PA-C   Patient coming from: Home  Chief Complaint: right foot pain  HPI:  Rachel George is a 36 y.o. female with medical history of one-week history of right foot pain and swelling. She has not noted any redness or lymphangitis or open wounds. The patient denies any recent injury or trauma to her right foot. However the patient was admitted to the hospital from 12/13/2016 through 12/19/2016 during which time she had a diabetic foot infection and osteomyelitis which resulted in a first ray amputation on the right.  The patient denies any recent injuries, trauma, or draining or open wounds on the right foot. She states that she checks her feet on a daily basis, and has not noticed any new wounds. She states that her right foot pain is worse with weightbearing and sharp in nature. She denies any fevers, chills, vomiting, diarrhea, dysuria, hematuria.  She states that she was seen by her PCP 1 week prior to this admission. There was some concern for Charcot foot, and a referral was made for the patient to see a podiatrist at [facility] in [facility] (May 21, 2017).    In emergency department, the patient was afebrile and hemodynamically stable saturating 100% on room air. BMP and CBC were unremarkable. X-ray of the right foot showed ostial lysis of the dorsal midfoot with new dystrophic calcification concerning for osteomyelitis. The patient has not been on any antibiotics.  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 Diabetes mellitus without complication (HCC)    dx age 56  . Fatty liver disease, nonalcoholic   . Macular degeneration    Past Surgical History:  Procedure Laterality Date  . AMPUTATION TOE Right 12/16/2016   Procedure: RIGHT FIRST TOE AMPUTATION;  Surgeon: Vickie Epley, MD;  Location: [facility];  Service: General;  Laterality: Right;  Right first toe amputation for osteomyelitis   Social History:  reports that she quit smoking about 4 months ago. Her smoking use included Cigarettes. She smoked 0.25 packs per day. She has never used smokeless tobacco. She reports that she does not drink alcohol or use drugs.   Family History  Problem Relation Age of Onset  . Diabetes Father      Allergies  Allergen Reactions  . Shellfish Allergy Anaphylaxis, Shortness Of Breath and Swelling    Facial Swelling  . Percocet [Oxycodone-Acetaminophen] Itching and Nausea And Vomiting     Prior to Admission medications   Medication Sig Start Date End Date Taking? Authorizing Provider  albuterol (PROVENTIL HFA;VENTOLIN HFA) 108 (90 Base) MCG/ACT inhaler Inhale 2 puffs into the lungs every 6 (six) hours as needed for wheezing or shortness  of breath.    [provider]  Carboxymethylcellulose Sodium (LUBRICANT EYE DROPS OP) Apply 1 drop to eye as needed.     [provider]  diclofenac (VOLTAREN) 75 MG EC tablet Take 1 tablet (75 mg total) by mouth 2 (two) times daily as needed. 12/11/16   Soyla Dryer, PA-C  gabapentin (NEURONTIN) 300 MG capsule Take 1  capsule (300 mg total) by mouth 2 (two) times daily. Patient not taking: Reported on 04/24/2017 03/18/17   Soyla Dryer, PA-C  insulin aspart (NOVOLOG) 100 UNIT/ML injection Inject 15 Units into the skin 3 (three) times daily before meals. 15 units plus sliding scales.    [provider]  insulin detemir (LEVEMIR) 100 UNIT/ML injection Inject 55 Units into the skin at bedtime. Reported on 04/16/2016    [provider]  lovastatin (MEVACOR) 20 MG tablet Take 1 tablet (20 mg total) by mouth at bedtime. 04/24/17   Soyla Dryer, PA-C  metFORMIN (GLUCOPHAGE) 500 MG tablet Take 1 tablet (500 mg total) by mouth 2 (two) times daily with a meal. 02/06/17   Soyla Dryer, PA-C  sitaGLIPtin (JANUVIA) 100 MG tablet Take 1 tablet (100 mg total) by mouth daily. 01/01/17   Soyla Dryer, PA-C  traMADol (ULTRAM) 50 MG tablet Take 1 tablet (50 mg total) by mouth every 6 (six) hours as needed. 09/04/16   Evalee Jefferson, PA-C    Review of Systems:  Constitutional:  No weight loss, night sweats, Fevers, chills, fatigue.  Head&Eyes: No headache.  No vision loss.  No eye pain or scotoma ENT:  No Difficulty swallowing,Tooth/dental problems,Sore throat,  No ear ache, post nasal drip,  Cardio-vascular:  No chest pain, Orthopnea, PND, swelling in lower extremities,  dizziness, palpitations  GI:  No  abdominal pain, nausea, vomiting, diarrhea, loss of appetite, hematochezia, melena, heartburn, indigestion, Resp:  No shortness of breath with exertion or at rest. No cough. No coughing up of blood .No wheezing.No chest wall deformity  Skin:  no rash or lesions.  GU:  no dysuria, change in color of urine, no urgency or frequency. No flank pain.  Musculoskeletal:  Complains of right foot pain. No back pain.  Psych:  No change in mood or affect. No depression or anxiety. Neurologic: No headache, no dysesthesia, no focal weakness, no vision loss. No syncope  Physical Exam: Vitals:   05/01/17  1205 05/01/17 1206 05/01/17 1559  BP: (!) 124/55  112/71  Pulse: 99  82  Resp: 18  16  Temp: 98.2 F (36.8 C)  98.2 F (36.8 C)  TempSrc: Oral  Oral  SpO2: 98%  100%  Weight:  (!) 154.2 kg (340 lb)   Height:  '5\' 7"'$  (1.702 m)    General:  A&O x 3, NAD, nontoxic, pleasant/cooperative Head/Eye: No conjunctival hemorrhage, no icterus, [place]/AT, No nystagmus ENT:  No icterus,  No thrush, good dentition, no pharyngeal exudate Neck:  No masses, no lymphadenpathy, no bruits CV:  RRR, no rub, no gallop, no S3 Lung:  CTAB, good air movement, no wheeze, no rhonchi Abdomen: soft/NT, +BS, nondistended, no peritoneal signs Ext: No cyanosis, No rashes, No petechiae, No lymphangitis, 1 + RLE edema;  No open wounds or erythema right foot Neuro: CNII-XII intact, strength 4/5 in bilateral upper and lower extremities, no dysmetria  Labs on Admission:  Basic Metabolic Panel:  Recent Labs Lab 05/01/17 1600  NA 138  K 4.4  CL 104  CO2 26  GLUCOSE 335*  BUN 16  CREATININE 0.75  CALCIUM 9.1   Liver Function Tests: No results for input(s): AST, ALT, ALKPHOS, BILITOT, PROT, ALBUMIN in the last 168 hours. No results for input(s): LIPASE, AMYLASE in the last 168 hours. No results for input(s): AMMONIA in the last 168 hours. CBC:  Recent Labs Lab 05/01/17 1600  WBC 8.1  NEUTROABS 2.3  HGB 12.7  HCT 38.8  MCV 79.8  PLT 217   Coagulation Profile: No results for input(s): INR, PROTIME in the last 168 hours. Cardiac Enzymes: No results for input(s): CKTOTAL, CKMB, CKMBINDEX, TROPONINI in the last 168 hours. BNP: Invalid input(s): POCBNP CBG: No results for input(s): GLUCAP in the last 168 hours. Urine analysis:    Component Value Date/Time   COLORURINE YELLOW 03/06/2017 1403   APPEARANCEUR HAZY (A) 03/06/2017 1403   LABSPEC 1.019 03/06/2017 1403   PHURINE 5.0 03/06/2017 1403   GLUCOSEU 50 (A) 03/06/2017 1403   HGBUR NEGATIVE 03/06/2017 1403   BILIRUBINUR NEG 04/17/2017 0844    KETONESUR NEGATIVE 03/06/2017 1403   PROTEINUR 100 mg/dL 04/17/2017 0844   PROTEINUR 100 (A) 03/06/2017 1403   UROBILINOGEN 0.2 04/17/2017 0844   UROBILINOGEN 0.2 09/08/2015 1740   NITRITE NEG 04/17/2017 0844   NITRITE NEGATIVE 03/06/2017 1403   LEUKOCYTESUR Negative 04/17/2017 0844   Sepsis Labs: '@LABRCNTIP'$ (procalcitonin:4,lacticidven:4) )No results found for this or any previous visit (from the past 240 hour(s)).   Radiological Exams on Admission: Dg Ankle Complete Right  Result Date: 05/01/2017 CLINICAL DATA:  36 year old diabetic female status post right great toe amputation in January this year. Four days of right foot an ankle pain with no known injury. EXAM: RIGHT ANKLE - COMPLETE 3+ VIEW COMPARISON:  Right foot series today reported separately. FINDINGS: Generalized soft tissue swelling. Preserved mortise joint alignment. No ankle joint effusion. Talar dome intact. Distal tibia, distal fibula, and calcaneus appear intact. Abnormal appearance of the dorsal midfoot as described on the right foot series today. IMPRESSION: 1. No acute fracture or dislocation identified about the right ankle. 2. Abnormal appearance of the dorsal midfoot. See right foot series today reported separately. Electronically Signed   By: Genevie Ann M.D.   On: 05/01/2017 14:46   Dg Foot Complete Right  Result Date: 05/01/2017 CLINICAL DATA:  36 year old diabetic female status post right great toe amputation in January this year. Four days of right foot an ankle pain with no known injury. EXAM: RIGHT FOOT COMPLETE - 3+ VIEW COMPARISON:  Right foot series 12/13/16. FINDINGS: Interval amputation of the right first ray from the base of the first proximal phalanx distally. The right first MTP joint appears to remain intact. The residual bone fragment of the first proximal phalanx appears intact without suspicious osteolysis. There is mild overlying soft tissue swelling. No subcutaneous gas. The second through fifth rays appear  intact. Tarsal bone alignment is preserved, but there is new dystrophic calcification and regional osteolysis along the dorsal midfoot compared to January (lateral view). There is overlying soft tissue swelling. No subcutaneous gas. Other tarsal structures appear stable and intact. IMPRESSION: 1. Dorsal midfoot osteolysis and new dystrophic calcifications since January highly suspicious for Acute Osteomyelitis (see lateral view). Recommend follow-up right foot MRI (without and with contrast preferred) or three-phase bone scan to evaluate further. 2. Dorsal right foot soft tissue swelling with no subcutaneous gas. 3. Satisfactory postoperative appearance of right first ray amputation. Electronically Signed   By: Genevie Ann M.D.   On: 05/01/2017 14:45      Time spent:50 minutes Code Status:  FULL Family Communication:  No Family at bedside Disposition Plan: expect 1 day hospitalization Consults called: none DVT Prophylaxis: [place] Lovenox  Salome Hautala, DO  [facility] Pager [phone]  If 7PM-7AM, please contact night-coverage [email or web address] Password [facility] 05/01/2017, 6:28 PM

## 2017-05-02 ENCOUNTER — Observation Stay (HOSPITAL_COMMUNITY): Payer: Self-pay

## 2017-05-02 DIAGNOSIS — E11610 Type 2 diabetes mellitus with diabetic neuropathic arthropathy: Secondary | ICD-10-CM

## 2017-05-02 DIAGNOSIS — M79604 Pain in right leg: Secondary | ICD-10-CM

## 2017-05-02 LAB — GLUCOSE, CAPILLARY
GLUCOSE-CAPILLARY: 180 mg/dL — AB (ref 65–99)
GLUCOSE-CAPILLARY: 224 mg/dL — AB (ref 65–99)
Glucose-Capillary: 159 mg/dL — ABNORMAL HIGH (ref 65–99)

## 2017-05-02 LAB — BASIC METABOLIC PANEL
Anion gap: 7 (ref 5–15)
BUN: 18 mg/dL (ref 6–20)
CALCIUM: 8.3 mg/dL — AB (ref 8.9–10.3)
CO2: 25 mmol/L (ref 22–32)
CREATININE: 0.75 mg/dL (ref 0.44–1.00)
Chloride: 105 mmol/L (ref 101–111)
GFR calc Af Amer: >60 mL/min (ref >60)
Glucose, Bld: 230 mg/dL — ABNORMAL HIGH (ref 65–99)
Potassium: 4 mmol/L (ref 3.5–5.1)
SODIUM: 137 mmol/L (ref 135–145)

## 2017-05-02 LAB — CBC
HCT: 38.1 % (ref 36.0–46.0)
Hemoglobin: 12.3 g/dL (ref 12.0–15.0)
MCH: 25.8 pg — ABNORMAL LOW (ref 26.0–34.0)
MCHC: 32.3 g/dL (ref 30.0–36.0)
MCV: 80 fL (ref 78.0–100.0)
PLATELETS: 212 10*3/uL (ref 150–400)
RBC: 4.76 MIL/uL (ref 3.87–5.11)
RDW: 15 % (ref 11.5–15.5)
WBC: 7.5 10*3/uL (ref 4.0–10.5)

## 2017-05-02 LAB — C-REACTIVE PROTEIN: CRP: 0.9 mg/dL (ref <1.0)

## 2017-05-02 MED ORDER — DOXYCYCLINE HYCLATE 100 MG PO CAPS: 100.0000 mg | ORAL_CAPSULE | Freq: Two times a day (BID) | ORAL | 0 refills | Status: DC

## 2017-05-02 MED ORDER — GADOBENATE DIMEGLUMINE 529 MG/ML IV SOLN
20.0000 mL | Freq: Once | INTRAVENOUS | Status: AC | PRN
  Administered 2017-05-02: 20 mL via INTRAVENOUS

## 2017-05-02 MED ORDER — PROMETHAZINE HCL 25 MG/ML IJ SOLN
12.5000 mg | Freq: Four times a day (QID) | INTRAMUSCULAR | Status: DC | PRN
  Administered 2017-05-02: 12.5 mg via INTRAVENOUS
  Filled 2017-05-02: qty 1

## 2017-05-02 NOTE — Discharge Instructions (Signed)
Increase activity slowly Follow up with your Doctor within 2 weeks of Discharge Use boot at all times when out of bed Non Weight Bearing to Right Leg

## 2017-05-02 NOTE — Discharge Summary (Signed)
Physician Discharge Summary  Rachel George ZOX:[id number] DOB: [birth year] DOA: 05/01/2017  PCP: Jacquelin Hawking, PA-C  Admit date: 05/01/2017 Discharge date: 05/02/2017  Admitted From: home Disposition: home  Recommendations for Outpatient Follow-up:  1. Follow up with podiatrist as previously scheduled (6/20)  Home Health:No Equipment/Devices: crutches, CAM boot  Discharge Condition:stable CODE STATUS: full code Diet recommendation: Heart Healthy / Carb Modified   Brief/Interim Summary: 36 y/o female with history of morbid obesity and diabetes, presented to the hospital with right foot pain and swelling. She had her right great toe amputation in 12/2016 for osteomyelitis. Xray of right foot in emergency room indicated possible osteomyelitis. She was admitted for further management. 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 Venous dopplers were negative for DVT. She was noted to have some warmth and swelling of right foot. She has been prescribed a course of doxycycline and will follow up with a podiatrist on 6/20. She has been prescribed a CAM boot and crutches so as not to bear weight on her right foot. She is otherwise stable for discharge.  Discharge Diagnoses:  Active Problems:   Diabetic polyneuropathy associated with type 2 diabetes mellitus (HCC)   Osteolysis, right ankle and foot   Uncontrolled type 2 diabetes mellitus with hyperglycemia, with long-term current use of insulin (HCC)   Obesity, Class III, BMI 40-49.9 (morbid obesity) (HCC)   Morbid obesity with BMI of 50.0-59.9, adult [facility])    Discharge Instructions  Discharge Instructions    Diet - low sodium heart healthy    Complete by:  As directed    Increase activity slowly    Complete by:  As directed      Allergies as of 05/02/2017      Reactions   Shellfish Allergy Anaphylaxis, Shortness Of Breath, Swelling   Facial Swelling    Percocet [oxycodone-acetaminophen] Itching, Nausea And Vomiting      Medication List    TAKE these medications   albuterol 108 (90 Base) MCG/ACT inhaler Commonly known as:  PROVENTIL HFA;VENTOLIN HFA Inhale 2 puffs into the lungs every 6 (six) hours as needed for wheezing or shortness of breath.   doxycycline 100 MG capsule Commonly known as:  VIBRAMYCIN Take 1 capsule (100 mg total) by mouth 2 (two) times daily.   gabapentin 300 MG capsule Commonly known as:  NEURONTIN Take 1 capsule (300 mg total) by mouth 2 (two) times daily.   insulin aspart 100 UNIT/ML injection Commonly known as:  novoLOG Inject 15 Units into the skin 3 (three) times daily before meals. 15 units plus sliding scales.   insulin detemir 100 UNIT/ML injection Commonly known as:  LEVEMIR Inject 55 Units into the skin at bedtime. Reported on 04/16/2016   lovastatin 20 MG tablet Commonly known as:  MEVACOR Take 1 tablet (20 mg total) by mouth at bedtime.   LUBRICANT EYE DROPS OP Apply 1 drop to eye daily as needed (for dry eye relief).   metFORMIN 500 MG tablet Commonly known as:  GLUCOPHAGE Take 1 tablet (500 mg total) by mouth 2 (two) times daily with a meal.   sitaGLIPtin 100 MG tablet Commonly known as:  JANUVIA Take 1 tablet (100 mg total) by mouth daily.   traMADol 50 MG tablet Commonly known as:  ULTRAM Take 1 tablet (50 mg total) by mouth every 6 (six) hours as needed.            Durable Medical Equipment  Start     Ordered   05/02/17 1556  For home use only DME Crutches  Once     05/02/17 1556      Allergies  Allergen Reactions  . Shellfish Allergy Anaphylaxis, Shortness Of Breath and Swelling    Facial Swelling  . Percocet [Oxycodone-Acetaminophen] Itching and Nausea And Vomiting    Consultations:     Procedures/Studies: Dg Ankle Complete Right  Result Date: 05/01/2017 CLINICAL DATA:  36 year old diabetic female status post right great toe amputation in  January this year. Four days of right foot an ankle pain with no known injury. EXAM: RIGHT ANKLE - COMPLETE 3+ VIEW COMPARISON:  Right foot series today reported separately. FINDINGS: Generalized soft tissue swelling. Preserved mortise joint alignment. No ankle joint effusion. Talar dome intact. Distal tibia, distal fibula, and calcaneus appear intact. Abnormal appearance of the dorsal midfoot as described on the right foot series today. IMPRESSION: 1. No acute fracture or dislocation identified about the right ankle. 2. Abnormal appearance of the dorsal midfoot. See right foot series today reported separately. Electronically Signed   By: Odessa FlemingH  Hall M.D.   On: 05/01/2017 14:46   Mr Foot Right W Wo Contrast  Result Date: 05/02/2017 CLINICAL DATA:  Right foot pain. Previous amputation of the right great toe. EXAM: MRI OF THE RIGHT FOREFOOT WITHOUT AND WITH CONTRAST TECHNIQUE: Multiplanar, multisequence MR imaging of the right forefoot was performed before and after the administration of intravenous contrast. CONTRAST:  20mL MULTIHANCE GADOBENATE DIMEGLUMINE 529 MG/ML IV SOLN COMPARISON:  Radiographs dated 05/01/2017 FINDINGS: Bones/Joint/Cartilage There are erosive changes of the dorsal aspect of middle cuneiform adjacent to the navicular. However, there is always slight soft tissue enhancement in that area with no enhancement of the bone. Therefore, I think this represents arthritic changes rather than osteomyelitis. The patient has a neuropathic foot. I suspect the findings represent early Charcot joint. There is slight generalized enhancement of the periarticular soft tissues in the midfoot which most likely represents generalized synovitis. Previous amputation of the great toe with a small remnant of the base of the proximal phalanx. No evidence of osteomyelitis at that site. Muscles and Tendons There is slight enhancement around the distal posterior tibialis tendon consistent with slight tenosynovitis of that  tendon. There is also slight enhancement around the distal peroneus longus tendon also consistent with slight tenosynovitis. IMPRESSION: 1. The erosive changes in the dorsal aspect of the middle cuneiform are felt represent early Charcot foot rather than osteomyelitis. 2. Generalized synovitis in the midfoot at also consistent with Charcot foot. 3. Slight tenosynovitis of the posterior tibialis tendon and peroneus longus tendon. Electronically Signed   By: Francene BoyersJames  Maxwell M.D.   On: 05/02/2017 10:15   [place] Venous Img Lower Unilateral Right  Result Date: 05/02/2017 CLINICAL DATA:  Right lower extremity pain and edema. History of varicose veins. Former smoker. Evaluate for DVT. EXAM: RIGHT LOWER EXTREMITY VENOUS DOPPLER ULTRASOUND TECHNIQUE: Gray-scale sonography with graded compression, as well as color Doppler and duplex ultrasound were performed to evaluate the lower extremity deep venous systems from the level of the common femoral vein and including the common femoral, femoral, profunda femoral, popliteal and calf veins including the posterior tibial, peroneal and gastrocnemius veins when visible. The superficial great saphenous vein was also interrogated. Spectral Doppler was utilized to evaluate flow at rest and with distal augmentation maneuvers in the common femoral, femoral and popliteal veins. COMPARISON:  None. FINDINGS: Contralateral Common Femoral Vein: Respiratory phasicity is normal and symmetric with  the symptomatic side. No evidence of thrombus. Normal compressibility. Common Femoral Vein: No evidence of thrombus. Normal compressibility, respiratory phasicity and response to augmentation. Saphenofemoral Junction: No evidence of thrombus. Normal compressibility and flow on color Doppler imaging. Profunda Femoral Vein: No evidence of thrombus. Normal compressibility and flow on color Doppler imaging. Femoral Vein: No evidence of thrombus. Normal compressibility, respiratory phasicity and response to  augmentation. Popliteal Vein: No evidence of thrombus. Normal compressibility, respiratory phasicity and response to augmentation. Calf Veins: No evidence of thrombus. Normal compressibility and flow on color Doppler imaging. Superficial Great Saphenous Vein: No evidence of thrombus. Normal compressibility and flow on color Doppler imaging. Venous Reflux:  None. Other Findings:  None. IMPRESSION: No evidence of DVT within the right lower extremity. Electronically Signed   By: Simonne Come M.D.   On: 05/02/2017 09:06   Dg Foot Complete Right  Result Date: 05/01/2017 CLINICAL DATA:  36 year old diabetic female status post right great toe amputation in January this year. Four days of right foot an ankle pain with no known injury. EXAM: RIGHT FOOT COMPLETE - 3+ VIEW COMPARISON:  Right foot series 12/13/16. FINDINGS: Interval amputation of the right first ray from the base of the first proximal phalanx distally. The right first MTP joint appears to remain intact. The residual bone fragment of the first proximal phalanx appears intact without suspicious osteolysis. There is mild overlying soft tissue swelling. No subcutaneous gas. The second through fifth rays appear intact. Tarsal bone alignment is preserved, but there is new dystrophic calcification and regional osteolysis along the dorsal midfoot compared to January (lateral view). There is overlying soft tissue swelling. No subcutaneous gas. Other tarsal structures appear stable and intact. IMPRESSION: 1. Dorsal midfoot osteolysis and new dystrophic calcifications since January highly suspicious for Acute Osteomyelitis (see lateral view). Recommend follow-up right foot MRI (without and with contrast preferred) or three-phase bone scan to evaluate further. 2. Dorsal right foot soft tissue swelling with no subcutaneous gas. 3. Satisfactory postoperative appearance of right first ray amputation. Electronically Signed   By: Odessa Fleming M.D.   On: 05/01/2017 14:45        Subjective: No new complaints.  Discharge Exam: Vitals:   05/01/17 2058 05/02/17 0545  BP: (!) 134/53 (!) 107/53  Pulse: 78 85  Resp: 20   Temp: 98 F (36.7 C) 98 F (36.7 C)   Vitals:   05/01/17 1921 05/01/17 2028 05/01/17 2058 05/02/17 0545  BP: 136/84  (!) 134/53 (!) 107/53  Pulse: 79  78 85  Resp: 18  20   Temp: 98 F (36.7 C)  98 F (36.7 C) 98 F (36.7 C)  TempSrc: Oral  Oral   SpO2: 100% 98% 100% 99%  Weight:   (!) 161.4 kg (355 lb 12.8 oz)   Height:        General: Pt is alert, awake, not in acute distress Cardiovascular: RRR, S1/S2 +, no rubs, no gallops Respiratory: CTA bilaterally, no wheezing, no rhonchi Abdominal: Soft, NT, ND, bowel sounds + Extremities: right foot is mildly warm and edematous, right great toe has been amputated. Surgical site looks clean.    The results of significant diagnostics from this hospitalization (including imaging, microbiology, ancillary and laboratory) are listed below for reference.     Microbiology: No results found for this or any previous visit (from the past 240 hour(s)).   Labs: BNP (last 3 results) No results for input(s): BNP in the last 8760 hours. Basic Metabolic Panel:  Recent Labs  Lab 05/01/17 1600 05/02/17 0627  NA 138 137  K 4.4 4.0  CL 104 105  CO2 26 25  GLUCOSE 335* 230*  BUN 16 18  CREATININE 0.75 0.75  CALCIUM 9.1 8.3*   Liver Function Tests: No results for input(s): AST, ALT, ALKPHOS, BILITOT, PROT, ALBUMIN in the last 168 hours. No results for input(s): LIPASE, AMYLASE in the last 168 hours. No results for input(s): AMMONIA in the last 168 hours. CBC:  Recent Labs Lab 05/01/17 1600 05/02/17 0627  WBC 8.1 7.5  NEUTROABS 2.3  --   HGB 12.7 12.3  HCT 38.8 38.1  MCV 79.8 80.0  PLT 217 212   Cardiac Enzymes: No results for input(s): CKTOTAL, CKMB, CKMBINDEX, TROPONINI in the last 168 hours. BNP: Invalid input(s): POCBNP CBG:  Recent Labs Lab 05/01/17 2056  05/02/17 0921 05/02/17 1113  GLUCAP 131* 180* 224*   D-Dimer No results for input(s): DDIMER in the last 72 hours. Hgb A1c No results for input(s): HGBA1C in the last 72 hours. Lipid Profile No results for input(s): CHOL, HDL, LDLCALC, TRIG, CHOLHDL, LDLDIRECT in the last 72 hours. Thyroid function studies No results for input(s): TSH, T4TOTAL, T3FREE, THYROIDAB in the last 72 hours.  Invalid input(s): FREET3 Anemia work up No results for input(s): VITAMINB12, FOLATE, FERRITIN, TIBC, IRON, RETICCTPCT in the last 72 hours. Urinalysis    Component Value Date/Time   COLORURINE YELLOW 03/06/2017 1403   APPEARANCEUR HAZY (A) 03/06/2017 1403   LABSPEC 1.019 03/06/2017 1403   PHURINE 5.0 03/06/2017 1403   GLUCOSEU 50 (A) 03/06/2017 1403   HGBUR NEGATIVE 03/06/2017 1403   BILIRUBINUR NEG 04/17/2017 0844   KETONESUR NEGATIVE 03/06/2017 1403   PROTEINUR 100 mg/dL 16/09/9603 5409   PROTEINUR 100 (A) 03/06/2017 1403   UROBILINOGEN 0.2 04/17/2017 0844   UROBILINOGEN 0.2 09/08/2015 1740   NITRITE NEG 04/17/2017 0844   NITRITE NEGATIVE 03/06/2017 1403   LEUKOCYTESUR Negative 04/17/2017 0844   Sepsis Labs Invalid input(s): PROCALCITONIN,  WBC,  LACTICIDVEN Microbiology No results found for this or any previous visit (from the past 240 hour(s)).   Time coordinating discharge: Over 30 minutes  SIGNED:   Erick Blinks, MD  [facility] 05/02/2017, 3:56 PM Pager   If 7PM-7AM, please contact night-coverage [email or web address] Password [email or web address]

## 2017-05-02 NOTE — Progress Notes (Signed)
D/C'd home with spouse via private car.  RX for ABT given to pt.  Right boot and crutches provided to pt upon d/c with [facility] app[facility].  No c/o at d/c

## 2017-05-03 LAB — HIV ANTIBODY (ROUTINE TESTING W REFLEX): HIV SCREEN 4TH GENERATION: NONREACTIVE

## 2017-05-08 ENCOUNTER — Encounter: Payer: Self-pay | Admitting: "Endocrinology

## 2017-05-08 ENCOUNTER — Ambulatory Visit: Payer: Self-pay | Admitting: Physician Assistant

## 2017-05-08 ENCOUNTER — Encounter: Payer: Self-pay | Admitting: Physician Assistant

## 2017-05-08 VITALS — BP 126/68 | HR 98 | Temp 97.5°F | Ht 66.0 in | Wt 355.0 lb

## 2017-05-08 DIAGNOSIS — E1142 Type 2 diabetes mellitus with diabetic polyneuropathy: Secondary | ICD-10-CM

## 2017-05-08 DIAGNOSIS — M89571 Osteolysis, right ankle and foot: Secondary | ICD-10-CM

## 2017-05-08 DIAGNOSIS — E1165 Type 2 diabetes mellitus with hyperglycemia: Secondary | ICD-10-CM

## 2017-05-08 DIAGNOSIS — E118 Type 2 diabetes mellitus with unspecified complications: Secondary | ICD-10-CM

## 2017-05-08 DIAGNOSIS — IMO0002 Reserved for concepts with insufficient information to code with codable children: Secondary | ICD-10-CM

## 2017-05-08 DIAGNOSIS — Z794 Long term (current) use of insulin: Secondary | ICD-10-CM

## 2017-05-08 NOTE — Progress Notes (Signed)
BP 126/68 (BP Location: Left Arm, Patient Position: Sitting, Cuff Size: Large)   Pulse 98   Temp 97.5 F (36.4 C) (Other (Comment))   Ht 5\' 6"  (1.676 m)   Wt (!) 355 lb (161 kg)   LMP 04/18/2017   SpO2 97%   BMI 57.30 kg/m    Subjective:    Patient ID: Rachel George, female    DOB: 1981-03-10, 36 y.o.   MRN: [id number]  HPI: Rachel George is a 36 y.o. female presenting on 05/08/2017 for Follow-up (from hospital, has appt with [facility] 05-21-17)   HPI Chief Complaint  Patient presents with  . Follow-up    from hospital, has appt with [facility] 05-21-17    Pt not wearing cam boot today but says she has been wearing it- just didn't today b/c she was coming here.    Pt says she has [employer] but doesn't remember when it is good until.   Pt states fbs this am was 100.  She doesn't have appt with endocrinology.  Reviewed referrral- they called her but got no answer so they sent her a letter.    Reviewed records from recent hospitalization including MRI report  Relevant past medical, surgical, family and social history reviewed and updated as indicated. Interim medical history since our last visit reviewed. Allergies and medications reviewed and updated.   Current Outpatient Prescriptions:  .  albuterol (PROVENTIL HFA;VENTOLIN HFA) 108 (90 Base) MCG/ACT inhaler, Inhale 2 puffs into the lungs every 6 (six) hours as needed for wheezing or shortness of breath., Disp: , Rfl:  .  Carboxymethylcellulose Sodium (LUBRICANT EYE DROPS OP), Apply 1 drop to eye daily as needed (for dry eye relief). , Disp: , Rfl:  .  doxycycline (VIBRAMYCIN) 100 MG capsule, Take 1 capsule (100 mg total) by mouth 2 (two) times daily., Disp: 14 capsule, Rfl: 0 .  gabapentin (NEURONTIN) 300 MG capsule, Take 1 capsule (300 mg total) by mouth 2 (two) times daily., Disp: 180 capsule, Rfl: 0 .  insulin aspart (NOVOLOG) 100 UNIT/ML injection, Inject 15 Units into the skin 3 (three) times  daily before meals. 15 units plus sliding scales., Disp: , Rfl:  .  insulin detemir (LEVEMIR) 100 UNIT/ML injection, Inject 58 Units into the skin at bedtime. Reported on 04/16/2016, Disp: , Rfl:  .  lovastatin (MEVACOR) 20 MG tablet, Take 1 tablet (20 mg total) by mouth at bedtime., Disp: 30 tablet, Rfl: 3 .  metFORMIN (GLUCOPHAGE) 500 MG tablet, Take 1 tablet (500 mg total) by mouth 2 (two) times daily with a meal., Disp: 180 tablet, Rfl: 1 .  sitaGLIPtin (JANUVIA) 100 MG tablet, Take 1 tablet (100 mg total) by mouth daily., Disp: 90 tablet, Rfl: 2 .  traMADol (ULTRAM) 50 MG tablet, Take 1 tablet (50 mg total) by mouth every 6 (six) hours as needed. (Patient not taking: Reported on 05/08/2017), Disp: 15 tablet, Rfl: 0   Review of Systems  Constitutional: Negative for appetite change, chills, diaphoresis, fatigue, fever and unexpected weight change.  HENT: Negative for congestion, drooling, ear pain, facial swelling, hearing loss, mouth sores, sneezing, sore throat, trouble swallowing and voice change.   Eyes: Positive for pain, discharge, redness, itching and visual disturbance.  Respiratory: Negative for cough, choking, shortness of breath and wheezing.   Cardiovascular: Positive for leg swelling. Negative for chest pain and palpitations.  Gastrointestinal: Negative for abdominal pain, blood in stool, constipation, diarrhea and vomiting.  Endocrine: Negative for cold  intolerance, heat intolerance and polydipsia.  Genitourinary: Negative for decreased urine volume, dysuria and hematuria.  Musculoskeletal: Positive for arthralgias and gait problem. Negative for back pain.  Skin: Negative for rash.  Allergic/Immunologic: Negative for environmental allergies.  Neurological: Negative for seizures, syncope, light-headedness and headaches.  Hematological: Negative for adenopathy.  Psychiatric/Behavioral: Positive for agitation and dysphoric mood. Negative for suicidal ideas. The patient is  nervous/anxious.     Per HPI unless specifically indicated above     Objective:    BP 126/68 (BP Location: Left Arm, Patient Position: Sitting, Cuff Size: Large)   Pulse 98   Temp 97.5 F (36.4 C) (Other (Comment))   Ht 5\' 6"  (1.676 m)   Wt (!) 355 lb (161 kg)   LMP 04/18/2017   SpO2 97%   BMI 57.30 kg/m   Wt Readings from Last 3 Encounters:  05/08/17 (!) 355 lb (161 kg)  05/01/17 (!) 355 lb 12.8 oz (161.4 kg)  04/24/17 (!) 348 lb 4 oz (158 kg)    Physical Exam  Constitutional: She is oriented to person, place, and time. She appears well-developed and well-nourished.  HENT:  Head: Normocephalic and atraumatic.  Neck: Neck supple.  Cardiovascular: Normal rate and regular rhythm.   Pulmonary/Chest: Effort normal and breath sounds normal.  Abdominal: Soft. Bowel sounds are normal. She exhibits no mass. There is no hepatosplenomegaly. There is no tenderness.  Musculoskeletal: She exhibits no edema.  Foot less swelled than at last OV.  Mild tenderness on dorsal surface.   No redness or drainage.   Lymphadenopathy:    She has no cervical adenopathy.  Neurological: She is alert and oriented to person, place, and time.  Skin: Skin is warm and dry.  Psychiatric: She has a normal mood and affect. Her behavior is normal.  Vitals reviewed.       Assessment & Plan:   Encounter Diagnoses  Name Primary?  . Osteolysis, right ankle and foot Yes  . Uncontrolled type 2 diabetes mellitus with complication, with long-term current use of insulin (HCC)   . Diabetic polyneuropathy associated with type 2 diabetes mellitus (HCC)      -pt was given copy of letter sent to her by endocrinology.  She will call them for appointment -urged pt to go to her podiatrist appointment (she was no-show to her podiatry appointment on 03/31/17) -urged pt to wear her cam boot to help her foot as she was instructed -pt to watch diabetic diet and bs.  -no changes to medications today -pt to follow up  here on 6/26 as scheduled.  RTO sooner prn

## 2017-05-13 ENCOUNTER — Encounter: Payer: Self-pay | Admitting: Physician Assistant

## 2017-05-19 ENCOUNTER — Ambulatory Visit: Payer: Self-pay | Admitting: Physician Assistant

## 2017-05-19 ENCOUNTER — Encounter: Payer: Self-pay | Admitting: Physician Assistant

## 2017-05-19 VITALS — BP 130/76 | HR 84 | Temp 97.7°F | Ht 66.0 in | Wt 351.2 lb

## 2017-05-19 DIAGNOSIS — J440 Chronic obstructive pulmonary disease with acute lower respiratory infection: Principal | ICD-10-CM

## 2017-05-19 DIAGNOSIS — J209 Acute bronchitis, unspecified: Secondary | ICD-10-CM

## 2017-05-19 MED ORDER — SULFAMETHOXAZOLE-TRIMETHOPRIM 800-160 MG PO TABS: 1.0000 | ORAL_TABLET | Freq: Two times a day (BID) | ORAL | 0 refills | Status: DC

## 2017-05-19 MED ORDER — BENZONATATE 100 MG PO CAPS: ORAL_CAPSULE | ORAL | 3 refills | Status: DC

## 2017-05-19 NOTE — Patient Instructions (Signed)

## 2017-05-19 NOTE — Progress Notes (Signed)
BP 130/76 (BP Location: Left Arm, Patient Position: Sitting, Cuff Size: Large)   Pulse 84   Temp 97.7 F (36.5 C)   Ht 5\' 6"  (1.676 m)   Wt (!) 351 lb 4 oz (159.3 kg)   SpO2 99%   BMI 56.69 kg/m    Subjective:    Patient ID: Rachel BarkerKaren George, female    DOB: 1981-06-19, 36 y.o.   MRN: [id number]  HPI: Rachel George is a 36 y.o. female presenting on 05/19/2017 for Nasal Congestion (since saturday. pt c/o sore throat, sneezing, chest pain, sinus congestion, coughing, phlegm (bloody mucous) SOB, wheezing, diffuculty breathing. pt states she has been taking robitussin cough syrup pt states has helped some.)   HPI   Chief Complaint  Patient presents with  . Nasal Congestion    since saturday. pt c/o sore throat, sneezing, chest pain, sinus congestion, coughing, phlegm (bloody mucous) SOB, wheezing, diffuculty breathing. pt states she has been taking robitussin cough syrup pt states has helped some.     Relevant past medical, surgical, family and social history reviewed and updated as indicated. Interim medical history since our last visit reviewed. Allergies and medications reviewed and updated.   Current Outpatient Prescriptions:  .  albuterol (PROVENTIL HFA;VENTOLIN HFA) 108 (90 Base) MCG/ACT inhaler, Inhale 2 puffs into the lungs every 6 (six) hours as needed for wheezing or shortness of breath., Disp: , Rfl:  .  Carboxymethylcellulose Sodium (LUBRICANT EYE DROPS OP), Apply 1 drop to eye daily as needed (for dry eye relief). , Disp: , Rfl:  .  doxycycline (VIBRAMYCIN) 100 MG capsule, Take 1 capsule (100 mg total) by mouth 2 (two) times daily., Disp: 14 capsule, Rfl: 0 .  gabapentin (NEURONTIN) 300 MG capsule, Take 1 capsule (300 mg total) by mouth 2 (two) times daily., Disp: 180 capsule, Rfl: 0 .  insulin aspart (NOVOLOG) 100 UNIT/ML injection, Inject 15 Units into the skin 3 (three) times daily before meals. 15 units plus sliding scales., Disp: , Rfl:  .  insulin detemir  (LEVEMIR) 100 UNIT/ML injection, Inject 58 Units into the skin at bedtime. Reported on 04/16/2016, Disp: , Rfl:  .  lovastatin (MEVACOR) 20 MG tablet, Take 1 tablet (20 mg total) by mouth at bedtime., Disp: 30 tablet, Rfl: 3 .  metFORMIN (GLUCOPHAGE) 500 MG tablet, Take 1 tablet (500 mg total) by mouth 2 (two) times daily with a meal., Disp: 180 tablet, Rfl: 1 .  sitaGLIPtin (JANUVIA) 100 MG tablet, Take 1 tablet (100 mg total) by mouth daily., Disp: 90 tablet, Rfl: 2 .  traMADol (ULTRAM) 50 MG tablet, Take 1 tablet (50 mg total) by mouth every 6 (six) hours as needed. (Patient not taking: Reported on 05/08/2017), Disp: 15 tablet, Rfl: 0   Review of Systems  Constitutional: Positive for appetite change, chills, diaphoresis, fatigue and fever. Negative for unexpected weight change.  HENT: Positive for congestion, ear pain, sneezing, sore throat, trouble swallowing and voice change. Negative for dental problem, drooling, facial swelling, hearing loss and mouth sores.   Eyes: Positive for pain, discharge, redness, itching and visual disturbance.  Respiratory: Positive for cough, chest tightness, shortness of breath and wheezing. Negative for choking.   Cardiovascular: Positive for leg swelling. Negative for chest pain and palpitations.  Gastrointestinal: Positive for diarrhea and vomiting (post-tussive emesis). Negative for abdominal pain, blood in stool and constipation.  Endocrine: Positive for cold intolerance and heat intolerance. Negative for polydipsia.  Genitourinary: Negative for decreased urine volume, dysuria and hematuria.  Musculoskeletal:  Positive for arthralgias and gait problem. Negative for back pain.  Skin: Negative for rash.  Allergic/Immunologic: Positive for environmental allergies.  Neurological: Positive for light-headedness and headaches. Negative for seizures and syncope.  Hematological: Positive for adenopathy.  Psychiatric/Behavioral: Positive for agitation and dysphoric  mood. Negative for suicidal ideas. The patient is nervous/anxious.     Per HPI unless specifically indicated above     Objective:    BP 130/76 (BP Location: Left Arm, Patient Position: Sitting, Cuff Size: Large)   Pulse 84   Temp 97.7 F (36.5 C)   Ht 5\' 6"  (1.676 m)   Wt (!) 351 lb 4 oz (159.3 kg)   SpO2 99%   BMI 56.69 kg/m   Wt Readings from Last 3 Encounters:  05/19/17 (!) 351 lb 4 oz (159.3 kg)  05/08/17 (!) 355 lb (161 kg)  05/01/17 (!) 355 lb 12.8 oz (161.4 kg)    Physical Exam  Constitutional: She is oriented to person, place, and time. She appears well-developed and well-nourished.  HENT:  Head: Normocephalic and atraumatic.  Right Ear: Hearing, tympanic membrane, external ear and ear canal normal.  Left Ear: Hearing, tympanic membrane, external ear and ear canal normal.  Nose: Mucosal edema and rhinorrhea present.  Mouth/Throat: Uvula is midline and oropharynx is clear and moist. No oropharyngeal exudate.  Neck: Neck supple.  Cardiovascular: Normal rate and regular rhythm.   Pulmonary/Chest: Effort normal and breath sounds normal. She has no wheezes.  Abdominal: Soft. Bowel sounds are normal. She exhibits no mass. There is no hepatosplenomegaly. There is no tenderness.  Musculoskeletal: She exhibits no edema.  R foot not changed from last office visit  Lymphadenopathy:    She has no cervical adenopathy.  Neurological: She is alert and oriented to person, place, and time.  Skin: Skin is warm and dry.  Psychiatric: She has a normal mood and affect. Her behavior is normal.  Vitals reviewed.       Assessment & Plan:    Encounter Diagnosis  Name Primary?  . Acute bronchitis with COPD (HCC) Yes     -Pt to Use her inhaler.  rx septra (pt cannot afford zpack).  rx tessalon Note ot of work 2 days. Rest. Fluids.  Tylenol or IBU prn.  -follow up next week as scheduled. RTO sooner prn

## 2017-05-21 ENCOUNTER — Ambulatory Visit: Payer: No Typology Code available for payment source | Admitting: Podiatry

## 2017-05-27 ENCOUNTER — Ambulatory Visit: Payer: Self-pay | Admitting: Physician Assistant

## 2017-05-27 ENCOUNTER — Encounter: Payer: Self-pay | Admitting: Physician Assistant

## 2017-05-27 VITALS — BP 146/84 | HR 87 | Temp 97.9°F | Ht 66.0 in | Wt 350.0 lb

## 2017-05-27 DIAGNOSIS — L602 Onychogryphosis: Secondary | ICD-10-CM

## 2017-05-27 DIAGNOSIS — M89571 Osteolysis, right ankle and foot: Secondary | ICD-10-CM

## 2017-05-27 DIAGNOSIS — Z899 Acquired absence of limb, unspecified: Secondary | ICD-10-CM

## 2017-05-27 DIAGNOSIS — E11319 Type 2 diabetes mellitus with unspecified diabetic retinopathy without macular edema: Secondary | ICD-10-CM

## 2017-05-27 DIAGNOSIS — IMO0002 Reserved for concepts with insufficient information to code with codable children: Secondary | ICD-10-CM

## 2017-05-27 DIAGNOSIS — E1142 Type 2 diabetes mellitus with diabetic polyneuropathy: Secondary | ICD-10-CM

## 2017-05-27 DIAGNOSIS — E1165 Type 2 diabetes mellitus with hyperglycemia: Secondary | ICD-10-CM

## 2017-05-27 DIAGNOSIS — Z91199 Patient's noncompliance with other medical treatment and regimen due to unspecified reason: Secondary | ICD-10-CM

## 2017-05-27 DIAGNOSIS — Z124 Encounter for screening for malignant neoplasm of cervix: Secondary | ICD-10-CM

## 2017-05-27 DIAGNOSIS — Z794 Long term (current) use of insulin: Secondary | ICD-10-CM

## 2017-05-27 DIAGNOSIS — E118 Type 2 diabetes mellitus with unspecified complications: Secondary | ICD-10-CM

## 2017-05-27 DIAGNOSIS — F39 Unspecified mood [affective] disorder: Secondary | ICD-10-CM

## 2017-05-27 DIAGNOSIS — Z9119 Patient's noncompliance with other medical treatment and regimen: Secondary | ICD-10-CM

## 2017-05-27 NOTE — Progress Notes (Signed)
BP (!) 146/84 (BP Location: Left Arm, Patient Position: Sitting, Cuff Size: Large)   Pulse 87   Temp 97.9 F (36.6 C) (Other (Comment))   Ht 5\' 6"  (1.676 m)   Wt (!) 350 lb (158.8 kg)   LMP 05/15/2017 (Exact Date)   SpO2 98%   BMI 56.49 kg/m    Subjective:    Patient ID: Rachel George, female    DOB: 1981-02-24, 36 y.o.   MRN: [id number]  HPI: Rachel George is a 36 y.o. female presenting on 05/27/2017 for Diabetes   HPI   Pt is again not wearing her cam boot as instructed  Her most recent a1c was 04/17/17- it was 10.5  Pt has no bs log.  She says she is not checking her bs.  Despite repeated emphasis on being told that she needs to get to the podiatrist, she again did not go to her appointment with the podiatrist.  Pt did not contact endocrinologist as instructed at last regular office visit  Pt says she has family planning medicaid  Relevant past medical, surgical, family and social history reviewed and updated as indicated. Interim medical history since our last visit reviewed. Allergies and medications reviewed and updated.   Current Outpatient Prescriptions:  .  albuterol (PROVENTIL HFA;VENTOLIN HFA) 108 (90 Base) MCG/ACT inhaler, Inhale 2 puffs into the lungs every 6 (six) hours as needed for wheezing or shortness of breath., Disp: , Rfl:  .  benzonatate (TESSALON PERLES) 100 MG capsule, 1-2 po q 8 hour prn cough, Disp: 14 capsule, Rfl: 3 .  Carboxymethylcellulose Sodium (LUBRICANT EYE DROPS OP), Apply 1 drop to eye daily as needed (for dry eye relief). , Disp: , Rfl:  .  gabapentin (NEURONTIN) 300 MG capsule, Take 1 capsule (300 mg total) by mouth 2 (two) times daily., Disp: 180 capsule, Rfl: 0 .  insulin aspart (NOVOLOG) 100 UNIT/ML injection, Inject 15 Units into the skin 3 (three) times daily before meals. 15 units plus sliding scales., Disp: , Rfl:  .  insulin detemir (LEVEMIR) 100 UNIT/ML injection, Inject 58 Units into the skin at bedtime. Reported on  04/16/2016, Disp: , Rfl:  .  lovastatin (MEVACOR) 20 MG tablet, Take 1 tablet (20 mg total) by mouth at bedtime., Disp: 30 tablet, Rfl: 3 .  metFORMIN (GLUCOPHAGE) 500 MG tablet, Take 1 tablet (500 mg total) by mouth 2 (two) times daily with a meal., Disp: 180 tablet, Rfl: 1 .  sitaGLIPtin (JANUVIA) 100 MG tablet, Take 1 tablet (100 mg total) by mouth daily., Disp: 90 tablet, Rfl: 2 .  sulfamethoxazole-trimethoprim (BACTRIM DS,SEPTRA DS) 800-160 MG tablet, Take 1 tablet by mouth 2 (two) times daily., Disp: 14 tablet, Rfl: 0   Review of Systems  Per HPI unless specifically indicated above     Objective:    BP (!) 146/84 (BP Location: Left Arm, Patient Position: Sitting, Cuff Size: Large)   Pulse 87   Temp 97.9 F (36.6 C) (Other (Comment))   Ht 5\' 6"  (1.676 m)   Wt (!) 350 lb (158.8 kg)   LMP 05/15/2017 (Exact Date)   SpO2 98%   BMI 56.49 kg/m   Wt Readings from Last 3 Encounters:  05/27/17 (!) 350 lb (158.8 kg)  05/19/17 (!) 351 lb 4 oz (159.3 kg)  05/08/17 (!) 355 lb (161 kg)    Physical Exam  Constitutional: She is oriented to person, place, and time. She appears well-developed and well-nourished.  HENT:  Head: Normocephalic and atraumatic.  Neck: Neck  supple.  Cardiovascular: Normal rate and regular rhythm.   Pulmonary/Chest: Effort normal and breath sounds normal.  Abdominal: Soft. Bowel sounds are normal. She exhibits no mass. There is no hepatosplenomegaly. There is no tenderness.  Musculoskeletal: She exhibits no edema.  R foot without changes from previous exam.  No open wounds or redness  Lymphadenopathy:    She has no cervical adenopathy.  Neurological: She is alert and oriented to person, place, and time.  Skin: Skin is warm and dry.  Psychiatric: She has a normal mood and affect. Her behavior is normal.  Vitals reviewed.          Assessment & Plan:    Encounter Diagnoses  Name Primary?  . Personal history of noncompliance with medical treatment,  presenting hazards to health Yes  . Uncontrolled type 2 diabetes mellitus with complication, with long-term current use of insulin (HCC)   . Osteolysis, right ankle and foot   . Diabetic polyneuropathy associated with type 2 diabetes mellitus (HCC)   . Diabetic retinopathy associated with type 2 diabetes mellitus, macular edema presence unspecified, unspecified laterality, unspecified retinopathy severity (HCC)   . S/P amputation   . Thickened nails   . Mood disorder (HCC)   . Morbid obesity (HCC)   . Cervical cancer screening      -pt has Family planning medicaid so will refer to gyn for PAP -pt again counseled on the need for her to do what she needs to do in order to improve her medical conditions as much as possible.  She is to call endocrinology for appointment (she says she still has the letter they sent). She is to go to her podiatrist appointment.  Pt counseled to monitor her blood sugars. -no medication changes today -pt to follow up in 2 months. RTO sooner prn

## 2017-05-27 NOTE — Patient Instructions (Signed)
-   go to podiatrist appointment - call endocrinologist for appointment

## 2017-06-03 ENCOUNTER — Ambulatory Visit: Payer: No Typology Code available for payment source | Admitting: Podiatry

## 2017-06-09 ENCOUNTER — Emergency Department (HOSPITAL_COMMUNITY): Payer: Self-pay

## 2017-06-09 ENCOUNTER — Encounter (HOSPITAL_COMMUNITY): Payer: Self-pay | Admitting: Emergency Medicine

## 2017-06-09 ENCOUNTER — Emergency Department (HOSPITAL_COMMUNITY)
Admission: EM | Admit: 2017-06-09 | Discharge: 2017-06-09 | Disposition: A | Discharge status: Home / Self Care | Payer: Self-pay | Attending: Emergency Medicine | Admitting: Emergency Medicine

## 2017-06-09 DIAGNOSIS — Z794 Long term (current) use of insulin: Secondary | ICD-10-CM | POA: Insufficient documentation

## 2017-06-09 DIAGNOSIS — M25562 Pain in left knee: Secondary | ICD-10-CM | POA: Insufficient documentation

## 2017-06-09 DIAGNOSIS — E119 Type 2 diabetes mellitus without complications: Secondary | ICD-10-CM | POA: Insufficient documentation

## 2017-06-09 DIAGNOSIS — Z79899 Other long term (current) drug therapy: Secondary | ICD-10-CM | POA: Insufficient documentation

## 2017-06-09 DIAGNOSIS — Z87891 Personal history of nicotine dependence: Secondary | ICD-10-CM | POA: Insufficient documentation

## 2017-06-09 LAB — CBG MONITORING, ED: Glucose-Capillary: 158 mg/dL — ABNORMAL HIGH (ref 65–99)

## 2017-06-09 MED ORDER — IBUPROFEN 800 MG PO TABS: 800.0000 mg | ORAL_TABLET | Freq: Three times a day (TID) | ORAL | 0 refills | Status: DC

## 2017-06-09 MED ORDER — IBUPROFEN 800 MG PO TABS
800.0000 mg | ORAL_TABLET | Freq: Once | ORAL | Status: AC
  Administered 2017-06-09: 800 mg via ORAL
  Filled 2017-06-09: qty 1

## 2017-06-09 NOTE — ED Triage Notes (Signed)
Pt reports falling down three steps today and falling onto L leg. Now reports L knee pain and chronic R foot pain. Denies hitting head or use of blood thinners.

## 2017-06-09 NOTE — ED Notes (Signed)
Pt returned from xray

## 2017-06-09 NOTE — Discharge Instructions (Signed)
Elevate and apply ice packs on/off to your knee.  Minimal weight bearing for one week.  Call Dr. Mort SawyersHarrison's office to arrange a follow-up appt in one week if not improving

## 2017-06-11 NOTE — ED Provider Notes (Signed)
[facility] Provider Note   CSN: [id number] Arrival date & time: 06/09/17  1410     History   Chief Complaint Chief Complaint  Patient presents with  . Fall    HPI Rachel George is a 36 y.o. female.  HPI   Rachel George is a 36 y.o. female who presents to the Emergency Department complaining of left knee pain after a mechanical fall that occurred prior to ER arrival.  States that right foot "went out" causing her to fall down several steps.  She describes a throbbing pain to the knee that's worse with bending.  She denies swelling, numbness, LOC, neck or back pain and headache.    Past Medical History:  Diagnosis Date  . Diabetes mellitus without complication (HCC)    dx age 66  . Fatty liver disease, nonalcoholic   . Macular degeneration     Patient Active Problem List   Diagnosis Date Noted  . 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 Uncontrolled type 2 diabetes mellitus with hyperglycemia, with long-term current use of insulin (HCC) 05/01/2017  . Obesity, Class III, BMI 40-49.9 (morbid obesity) (HCC) 05/01/2017  . Morbid obesity with BMI of 50.0-59.9, adult (HCC) 05/01/2017  . Diabetic retinopathy associated with type 2 diabetes mellitus (HCC) 03/18/2017  . 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  . Osteomyelitis (HCC) 12/13/2016  . Diabetic foot ulcer (HCC) 10/15/2016  . Cellulitis of toe of right foot 10/07/2016  . Uncontrolled type 2 diabetes mellitus with complication (HCC) 09/09/2016  . Morbid obesity (HCC) 09/09/2016  . Diabetic polyneuropathy associated with type 2 diabetes mellitus (HCC) 09/09/2016    Past Surgical History:  Procedure Laterality Date  . AMPUTATION TOE Right 12/16/2016   Procedure: RIGHT FIRST TOE AMPUTATION;  Surgeon: Ancil Linsey, MD;  Location: [facility];  Service: General;  Laterality: Right;  Right first toe amputation for osteomyelitis    OB History    Gravida Para Term Preterm AB Living   0 0 0 0 0 0   SAB TAB Ectopic  Multiple Live Births   0 0 0 0 0       Home Medications    Prior to Admission medications   Medication Sig Start Date End Date Taking? Authorizing Provider  albuterol (PROVENTIL HFA;VENTOLIN HFA) 108 (90 Base) MCG/ACT inhaler Inhale 2 puffs into the lungs every 6 (six) hours as needed for wheezing or shortness of breath.   Yes [provider]  benzonatate (TESSALON PERLES) 100 MG capsule 1-2 po q 8 hour prn cough 05/19/17  Yes Jacquelin Hawking, PA-C  Carboxymethylcellulose Sodium (LUBRICANT EYE DROPS OP) Apply 1 drop to eye daily as needed (for dry eye relief).    Yes [provider]  gabapentin (NEURONTIN) 300 MG capsule Take 1 capsule (300 mg total) by mouth 2 (two) times daily. 03/18/17  Yes Jacquelin Hawking, PA-C  insulin aspart (NOVOLOG) 100 UNIT/ML injection Inject 15 Units into the skin 3 (three) times daily before meals. 15 units plus sliding scales.   Yes [provider]  insulin detemir (LEVEMIR) 100 UNIT/ML injection Inject 58 Units into the skin at bedtime. Reported on 04/16/2016   Yes [provider]  lovastatin (MEVACOR) 20 MG tablet Take 1 tablet (20 mg total) by mouth at bedtime. 04/24/17  Yes Jacquelin Hawking, PA-C  metFORMIN (GLUCOPHAGE) 500 MG tablet Take 1 tablet (500 mg total) by mouth 2 (two) times daily with a meal. 02/06/17  Yes Jacquelin Hawking, PA-C  sitaGLIPtin (JANUVIA) 100 MG tablet Take 1  tablet (100 mg total) by mouth daily. 01/01/17  Yes Jacquelin HawkingMcElroy, Shannon, PA-C  ibuprofen (ADVIL,MOTRIN) 800 MG tablet Take 1 tablet (800 mg total) by mouth 3 (three) times daily. 06/09/17   Pauline Ausriplett, Tyrees Chopin, PA-C    Family History Family History  Problem Relation Age of Onset  . Diabetes Father     Social History Social History  Substance Use Topics  . Smoking status: Former Smoker    Packs/day: 0.25    Types: Cigarettes    Quit date: 12/11/2016  . Smokeless tobacco: Never Used  . Alcohol use No     Allergies   Shellfish allergy and  Percocet [oxycodone-acetaminophen]   Review of Systems Review of Systems  Constitutional: Negative for chills and fever.  Respiratory: Negative for shortness of breath.   Cardiovascular: Negative for chest pain.  Musculoskeletal: Positive for arthralgias and joint swelling.  Skin: Negative for color change and wound.  Neurological: Negative for weakness and numbness.  All other systems reviewed and are negative.    Physical Exam Updated Vital Signs BP 131/70 (BP Location: Right Arm)   Pulse 86   Temp 98.3 F (36.8 C) (Oral)   Resp 18   Ht 5\' 6"  (1.676 m)   Wt (!) 158.8 kg (350 lb)   LMP 05/15/2017 (Exact Date)   SpO2 100%   BMI 56.49 kg/m   Physical Exam  Constitutional: She is oriented to person, place, and time. She appears well-developed and well-nourished. No distress.  HENT:  Head: Atraumatic.  Mouth/Throat: Oropharynx is clear and moist.  Cardiovascular: Normal rate, regular rhythm, normal heart sounds and intact distal pulses.   Pulmonary/Chest: Effort normal and breath sounds normal.  Musculoskeletal: She exhibits tenderness. She exhibits no edema.  ttp of the anterior left knee.  Pain reproduced on extension.  No obvious ligament instability.  No edema, or effusion.  Calf is soft and NT.  Neurological: She is alert and oriented to person, place, and time. She exhibits normal muscle tone. Coordination normal.  Skin: Skin is warm and dry. No erythema.  Nursing note and vitals reviewed.    ED Treatments / Results  Labs (all labs ordered are listed, but only abnormal results are displayed) Labs Reviewed  CBG MONITORING, ED - Abnormal; Notable for the following:       Result Value   Glucose-Capillary 158 (*)    All other components within normal limits    EKG  EKG Interpretation None       Radiology Dg Knee Complete 4 Views Left  Result Date: 06/09/2017 CLINICAL DATA:  Left knee pain.  Fall down steps today. EXAM: LEFT KNEE - COMPLETE 4+ VIEW  COMPARISON:  07/17/2009 FINDINGS: Early spurring in the patellofemoral compartment. No acute bony abnormality. Specifically, no fracture, subluxation, or dislocation. Soft tissues are intact. No joint effusion. IMPRESSION: No acute bony abnormality. Electronically Signed   By: Charlett NoseKevin  Dover M.D.   On: 06/09/2017 15:13    Procedures Procedures (including critical care time)  Medications Ordered in ED Medications  ibuprofen (ADVIL,MOTRIN) tablet 800 mg (800 mg Oral Given 06/09/17 1523)     Initial Impression / Assessment and Plan / ED Course  I have reviewed the triage vital signs and the nursing notes.  Pertinent labs & imaging results that were available during my care of the patient were reviewed by me and considered in my medical decision making (see chart for details).     Likely sprain.  No effusion.  XR reassuring.    Knee  sleeve applied.  Agrees to RICE therapy and orthopedic f/u in one week if needed.   Final Clinical Impressions(s) / ED Diagnoses   Final diagnoses:  Acute pain of left knee    New Prescriptions Discharge Medication List as of 06/09/2017  3:25 PM    START taking these medications   Details  ibuprofen (ADVIL,MOTRIN) 800 MG tablet Take 1 tablet (800 mg total) by mouth 3 (three) times daily., Starting Mon 06/09/2017, Print         Judi Jaffe, [place], PA-C 06/11/17 1610    Samuel Jester, DO 06/13/17 1548

## 2017-06-23 ENCOUNTER — Emergency Department (HOSPITAL_COMMUNITY)
Admission: EM | Admit: 2017-06-23 | Discharge: 2017-06-23 | Disposition: A | Discharge status: Home / Self Care | Payer: No Typology Code available for payment source | Attending: Emergency Medicine | Admitting: Emergency Medicine

## 2017-06-23 ENCOUNTER — Ambulatory Visit: Payer: Self-pay | Admitting: Physician Assistant

## 2017-06-23 ENCOUNTER — Encounter (HOSPITAL_COMMUNITY): Payer: Self-pay

## 2017-06-23 DIAGNOSIS — E119 Type 2 diabetes mellitus without complications: Secondary | ICD-10-CM | POA: Diagnosis not present

## 2017-06-23 DIAGNOSIS — M7918 Myalgia, other site: Secondary | ICD-10-CM

## 2017-06-23 DIAGNOSIS — Z79899 Other long term (current) drug therapy: Secondary | ICD-10-CM | POA: Insufficient documentation

## 2017-06-23 DIAGNOSIS — Y9389 Activity, other specified: Secondary | ICD-10-CM | POA: Insufficient documentation

## 2017-06-23 DIAGNOSIS — H1031 Unspecified acute conjunctivitis, right eye: Secondary | ICD-10-CM

## 2017-06-23 DIAGNOSIS — Y929 Unspecified place or not applicable: Secondary | ICD-10-CM | POA: Diagnosis not present

## 2017-06-23 DIAGNOSIS — Z794 Long term (current) use of insulin: Secondary | ICD-10-CM | POA: Insufficient documentation

## 2017-06-23 DIAGNOSIS — M546 Pain in thoracic spine: Secondary | ICD-10-CM | POA: Insufficient documentation

## 2017-06-23 DIAGNOSIS — Z87891 Personal history of nicotine dependence: Secondary | ICD-10-CM | POA: Insufficient documentation

## 2017-06-23 MED ORDER — IBUPROFEN 600 MG PO TABS: 600.0000 mg | ORAL_TABLET | Freq: Four times a day (QID) | ORAL | 0 refills | Status: DC | PRN

## 2017-06-23 MED ORDER — TOBRAMYCIN 0.3 % OP SOLN: 2.0000 [drp] | OPHTHALMIC | 0 refills | Status: DC

## 2017-06-23 MED ORDER — IBUPROFEN 800 MG PO TABS: 800.0000 mg | ORAL_TABLET | Freq: Three times a day (TID) | ORAL | 0 refills | Status: DC

## 2017-06-23 NOTE — ED Notes (Signed)
Patient given discharge instruction, verbalized understand. Patient ambulatory out of the department.  
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
## 2017-06-23 NOTE — Discharge Instructions (Signed)
Return if any problems.
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
## 2017-07-11 ENCOUNTER — Encounter: Payer: Self-pay | Admitting: Obstetrics & Gynecology

## 2017-07-22 ENCOUNTER — Ambulatory Visit: Payer: No Typology Code available for payment source | Admitting: Podiatry

## 2017-07-28 ENCOUNTER — Encounter: Payer: Self-pay | Admitting: Physician Assistant

## 2017-07-28 ENCOUNTER — Ambulatory Visit: Payer: Self-pay | Admitting: Physician Assistant

## 2017-07-28 VITALS — BP 124/82 | HR 92 | Temp 97.7°F | Ht 66.0 in | Wt 356.0 lb

## 2017-07-28 DIAGNOSIS — E1142 Type 2 diabetes mellitus with diabetic polyneuropathy: Secondary | ICD-10-CM

## 2017-07-28 DIAGNOSIS — Z794 Long term (current) use of insulin: Secondary | ICD-10-CM

## 2017-07-28 DIAGNOSIS — IMO0002 Reserved for concepts with insufficient information to code with codable children: Secondary | ICD-10-CM

## 2017-07-28 DIAGNOSIS — M89571 Osteolysis, right ankle and foot: Secondary | ICD-10-CM

## 2017-07-28 DIAGNOSIS — Z91199 Patient's noncompliance with other medical treatment and regimen due to unspecified reason: Secondary | ICD-10-CM

## 2017-07-28 DIAGNOSIS — E11319 Type 2 diabetes mellitus with unspecified diabetic retinopathy without macular edema: Secondary | ICD-10-CM

## 2017-07-28 DIAGNOSIS — E118 Type 2 diabetes mellitus with unspecified complications: Secondary | ICD-10-CM

## 2017-07-28 DIAGNOSIS — Z899 Acquired absence of limb, unspecified: Secondary | ICD-10-CM

## 2017-07-28 DIAGNOSIS — H1031 Unspecified acute conjunctivitis, right eye: Secondary | ICD-10-CM

## 2017-07-28 DIAGNOSIS — E1165 Type 2 diabetes mellitus with hyperglycemia: Secondary | ICD-10-CM

## 2017-07-28 DIAGNOSIS — Z9119 Patient's noncompliance with other medical treatment and regimen: Secondary | ICD-10-CM

## 2017-07-28 MED ORDER — OFLOXACIN 0.3 % OP SOLN: 2.0000 [drp] | Freq: Four times a day (QID) | OPHTHALMIC | 0 refills | Status: AC

## 2017-07-28 NOTE — Patient Instructions (Signed)

## 2017-07-28 NOTE — Progress Notes (Signed)
BP 124/82 (BP Location: Left Arm, Patient Position: Sitting, Cuff Size: Large)   Pulse 92   Temp 97.7 F (36.5 C)   Ht 5\' 6"  (1.676 m)   Wt (!) 356 lb (161.5 kg)   SpO2 97%   BMI 57.46 kg/m    Subjective:    Patient ID: Rachel George, female    DOB: December 07, 1980, 36 y.o.   MRN: [id number]  HPI: Rachel George is a 36 y.o. female presenting on 07/28/2017 for Diabetes; Hyperlipidemia; and Eye Problem (eye redness, itching, drainage. pt states drops have not seem to be working. pt states eye problem began July 22.)   HPI   Chief Complaint  Patient presents with  . Diabetes  . Hyperlipidemia  . Eye Problem    eye redness, itching, drainage. pt states drops have not seem to be working. pt states eye problem began July 22.     Pt says she didn't go to her podiatrist appointment (AGAIN) because she didn't have the gas.  She has yet another appt scheduled.  Again today she is not wearing her CAM boot as instructed.   She has appt with endocrine this Friday  She hasn't called [facility] back yet (to get her PAP done) because she lost her medicaid card and needs to get a replacement (she has family planning medicaid)  She is not checking her bs often b/c she doesn't have strips.  She checks it occasionally with her mother's meter.    Pt has been using the tobramycin drops for over a month that she was given in the ER.  She says it hasn't helped.  She does not wear contacts.  She wears glasses.  Her problem is only in her R eye.   Relevant past medical, surgical, family and social history reviewed and updated as indicated. Interim medical history since our last visit reviewed. Allergies and medications reviewed and updated.   Current Outpatient Prescriptions:  .  albuterol (PROVENTIL HFA;VENTOLIN HFA) 108 (90 Base) MCG/ACT inhaler, Inhale 2 puffs into the lungs every 6 (six) hours as needed for wheezing or shortness of breath., Disp: , Rfl:  .  Carboxymethylcellulose Sodium  (LUBRICANT EYE DROPS OP), Apply 1 drop to eye daily as needed (for dry eye relief). , Disp: , Rfl:  .  gabapentin (NEURONTIN) 300 MG capsule, Take 1 capsule (300 mg total) by mouth 2 (two) times daily., Disp: 180 capsule, Rfl: 0 .  ibuprofen (ADVIL,MOTRIN) 800 MG tablet, Take 1 tablet (800 mg total) by mouth 3 (three) times daily., Disp: 21 tablet, Rfl: 0 .  insulin aspart (NOVOLOG) 100 UNIT/ML injection, Inject 15 Units into the skin 3 (three) times daily before meals. 15 units plus sliding scales., Disp: , Rfl:  .  insulin detemir (LEVEMIR) 100 UNIT/ML injection, Inject 55 Units into the skin at bedtime. Reported on 04/16/2016, Disp: , Rfl:  .  lovastatin (MEVACOR) 20 MG tablet, Take 1 tablet (20 mg total) by mouth at bedtime., Disp: 30 tablet, Rfl: 3 .  metFORMIN (GLUCOPHAGE) 500 MG tablet, Take 1 tablet (500 mg total) by mouth 2 (two) times daily with a meal., Disp: 180 tablet, Rfl: 1 .  sitaGLIPtin (JANUVIA) 100 MG tablet, Take 1 tablet (100 mg total) by mouth daily., Disp: 90 tablet, Rfl: 2 .  tobramycin (TOBREX) 0.3 % ophthalmic solution, Place 2 drops into the right eye every 4 (four) hours., Disp: 5 mL, Rfl: 0   Review of Systems  Constitutional: Negative for appetite change, chills, diaphoresis,  fatigue, fever and unexpected weight change.  HENT: Negative for congestion, drooling, ear pain, facial swelling, hearing loss, mouth sores, sneezing, sore throat, trouble swallowing and voice change.   Eyes: Negative for pain, discharge, redness, itching and visual disturbance.  Respiratory: Negative for cough, choking, shortness of breath and wheezing.   Cardiovascular: Negative for chest pain, palpitations and leg swelling.  Gastrointestinal: Negative for abdominal pain, blood in stool, constipation, diarrhea and vomiting.  Endocrine: Negative for cold intolerance, heat intolerance and polydipsia.  Genitourinary: Negative for decreased urine volume, dysuria and hematuria.  Musculoskeletal:  Negative for arthralgias, back pain and gait problem.  Skin: Negative for rash.  Allergic/Immunologic: Negative for environmental allergies.  Neurological: Negative for seizures, syncope, light-headedness and headaches.  Hematological: Negative for adenopathy.  Psychiatric/Behavioral: Negative for agitation, dysphoric mood and suicidal ideas. The patient is not nervous/anxious.     Per HPI unless specifically indicated above     Objective:    BP 124/82 (BP Location: Left Arm, Patient Position: Sitting, Cuff Size: Large)   Pulse 92   Temp 97.7 F (36.5 C)   Ht 5\' 6"  (1.676 m)   Wt (!) 356 lb (161.5 kg)   SpO2 97%   BMI 57.46 kg/m   Wt Readings from Last 3 Encounters:  07/28/17 (!) 356 lb (161.5 kg)  06/23/17 (!) 350 lb (158.8 kg)  06/09/17 (!) 350 lb (158.8 kg)    Physical Exam  Constitutional: She is oriented to person, place, and time. She appears well-developed and well-nourished.  HENT:  Head: Normocephalic and atraumatic.  Eyes: Right conjunctiva is injected. Left conjunctiva is not injected. Left conjunctiva has no hemorrhage.  Instilled 1 gtt tetracaine R eye.  fluorescein instilled. Exam with woods lamp reveals no uptake, abrasion, ulceration.   Neck: Neck supple.  Cardiovascular: Normal rate and regular rhythm.   Pulmonary/Chest: Effort normal and breath sounds normal.  Abdominal: Soft. Bowel sounds are normal. She exhibits no mass. There is no hepatosplenomegaly. There is no tenderness.  Musculoskeletal: She exhibits no edema.  Feet without change. No redness or drainage. No open wounds  Lymphadenopathy:    She has no cervical adenopathy.  Neurological: She is alert and oriented to person, place, and time.  Skin: Skin is warm and dry.  Psychiatric: She has a normal mood and affect. Her behavior is normal.  Vitals reviewed.       Assessment & Plan:   Encounter Diagnoses  Name Primary?  . Personal history of noncompliance with medical treatment, presenting  hazards to health Yes  . Acute conjunctivitis of right eye, unspecified acute conjunctivitis type   . Uncontrolled type 2 diabetes mellitus with complication, with long-term current use of insulin (HCC)   . Osteolysis, right ankle and foot   . Diabetic polyneuropathy associated with type 2 diabetes mellitus (HCC)   . Diabetic retinopathy associated with type 2 diabetes mellitus, macular edema presence unspecified, unspecified laterality, unspecified retinopathy severity (HCC)   . S/P amputation      -Pt is given bs log to use to record her values and take with her to appt with Dr Fransico Him this Friday.   -pt urged to get to podiatrist appointment.  She is encouraged to use her CAM boot as directed -will order  Labs that pt is to get drawn tomorrow morning.  She is urged to do this so that results will be available for endocrinology appointment.   -pt is to STOP using the tobramycin.  She is given rx ocuflox (chosen  in part due to cost).  Pt is urged to RTO in one week if not improved, sooner if worsens or new symptoms develop -pt to follow up 6 weeks.  RTO sooner prn

## 2017-07-30 ENCOUNTER — Encounter: Payer: Self-pay | Admitting: Physician Assistant

## 2017-07-30 ENCOUNTER — Other Ambulatory Visit (HOSPITAL_COMMUNITY)
Admission: RE | Admit: 2017-07-30 | Discharge: 2017-07-30 | Disposition: A | Discharge status: Home / Self Care | Payer: Self-pay | Source: Ambulatory Visit | Attending: Physician Assistant | Admitting: Physician Assistant

## 2017-07-30 ENCOUNTER — Ambulatory Visit: Payer: Self-pay | Admitting: Physician Assistant

## 2017-07-30 VITALS — BP 118/72 | HR 98 | Temp 97.9°F | Ht 66.0 in | Wt 354.0 lb

## 2017-07-30 DIAGNOSIS — E1142 Type 2 diabetes mellitus with diabetic polyneuropathy: Secondary | ICD-10-CM

## 2017-07-30 DIAGNOSIS — IMO0002 Reserved for concepts with insufficient information to code with codable children: Secondary | ICD-10-CM

## 2017-07-30 DIAGNOSIS — E1165 Type 2 diabetes mellitus with hyperglycemia: Secondary | ICD-10-CM | POA: Insufficient documentation

## 2017-07-30 DIAGNOSIS — E118 Type 2 diabetes mellitus with unspecified complications: Secondary | ICD-10-CM | POA: Insufficient documentation

## 2017-07-30 DIAGNOSIS — Z794 Long term (current) use of insulin: Secondary | ICD-10-CM | POA: Insufficient documentation

## 2017-07-30 DIAGNOSIS — E11319 Type 2 diabetes mellitus with unspecified diabetic retinopathy without macular edema: Secondary | ICD-10-CM

## 2017-07-30 DIAGNOSIS — H1031 Unspecified acute conjunctivitis, right eye: Secondary | ICD-10-CM

## 2017-07-30 LAB — COMPREHENSIVE METABOLIC PANEL
ALT: 11 U/L — ABNORMAL LOW (ref 14–54)
AST: 14 U/L — AB (ref 15–41)
Albumin: 3.5 g/dL (ref 3.5–5.0)
Alkaline Phosphatase: 64 U/L (ref 38–126)
Anion gap: 7 (ref 5–15)
BILIRUBIN TOTAL: 0.2 mg/dL — AB (ref 0.3–1.2)
BUN: 16 mg/dL (ref 6–20)
CHLORIDE: 106 mmol/L (ref 101–111)
CO2: 23 mmol/L (ref 22–32)
Calcium: 9 mg/dL (ref 8.9–10.3)
Creatinine, Ser: 0.84 mg/dL (ref 0.44–1.00)
GFR calc Af Amer: >60 mL/min (ref >60)
GFR calc non Af Amer: >60 mL/min (ref >60)
Glucose, Bld: 240 mg/dL — ABNORMAL HIGH (ref 65–99)
POTASSIUM: 4 mmol/L (ref 3.5–5.1)
SODIUM: 136 mmol/L (ref 135–145)
Total Protein: 7.2 g/dL (ref 6.5–8.1)

## 2017-07-30 LAB — LIPID PANEL
CHOLESTEROL: 177 mg/dL (ref 0–200)
HDL: 49 mg/dL (ref >40)
LDL CALC: 92 mg/dL (ref 0–99)
Total CHOL/HDL Ratio: 3.6 RATIO
Triglycerides: 181 mg/dL — ABNORMAL HIGH (ref <150)
VLDL: 36 mg/dL (ref 0–40)

## 2017-07-30 LAB — HEMOGLOBIN A1C
Hgb A1c MFr Bld: 9.6 % — ABNORMAL HIGH (ref 4.8–5.6)
Mean Plasma Glucose: 228.82 mg/dL

## 2017-07-30 NOTE — Progress Notes (Signed)
BP 118/72 (BP Location: Left Arm, Patient Position: Sitting, Cuff Size: Large)   Pulse 98   Temp 97.9 F (36.6 C)   Ht 5\' 6"  (1.676 m)   Wt (!) 354 lb (160.6 kg)   SpO2 96%   BMI 57.14 kg/m    Subjective:    Patient ID: Rachel George, female    DOB: 04-Apr-1981, 36 y.o.   MRN: [id number]  HPI: Rachel George is a 36 y.o. female presenting on 07/30/2017 for No chief complaint on file.   HPI  Pt here today wanting note to return to work.  She has been using the ocuflox for 48 hours and she says her eye has improved.  It is still watering some.   Visual acuity not checked because pt states she can't see out of her R eye at baseline due to diabetic changes  Relevant past medical, surgical, family and social history reviewed and updated as indicated. Interim medical history since our last visit reviewed. Allergies and medications reviewed and updated.   Current Outpatient Prescriptions:  .  albuterol (PROVENTIL HFA;VENTOLIN HFA) 108 (90 Base) MCG/ACT inhaler, Inhale 2 puffs into the lungs every 6 (six) hours as needed for wheezing or shortness of breath., Disp: , Rfl:  .  Carboxymethylcellulose Sodium (LUBRICANT EYE DROPS OP), Apply 1 drop to eye daily as needed (for dry eye relief). , Disp: , Rfl:  .  gabapentin (NEURONTIN) 300 MG capsule, Take 1 capsule (300 mg total) by mouth 2 (two) times daily., Disp: 180 capsule, Rfl: 0 .  ibuprofen (ADVIL,MOTRIN) 800 MG tablet, Take 1 tablet (800 mg total) by mouth 3 (three) times daily., Disp: 21 tablet, Rfl: 0 .  insulin aspart (NOVOLOG) 100 UNIT/ML injection, Inject 15 Units into the skin 3 (three) times daily before meals. 15 units plus sliding scales., Disp: , Rfl:  .  insulin detemir (LEVEMIR) 100 UNIT/ML injection, Inject 55 Units into the skin at bedtime. Reported on 04/16/2016, Disp: , Rfl:  .  lovastatin (MEVACOR) 20 MG tablet, Take 1 tablet (20 mg total) by mouth at bedtime., Disp: 30 tablet, Rfl: 3 .  metFORMIN (GLUCOPHAGE) 500 MG  tablet, Take 1 tablet (500 mg total) by mouth 2 (two) times daily with a meal., Disp: 180 tablet, Rfl: 1 .  ofloxacin (OCUFLOX) 0.3 % ophthalmic solution, Place 2 drops into the right eye 4 (four) times daily., Disp: 5 mL, Rfl: 0 .  sitaGLIPtin (JANUVIA) 100 MG tablet, Take 1 tablet (100 mg total) by mouth daily., Disp: 90 tablet, Rfl: 2   Review of Systems  Constitutional: Positive for fatigue. Negative for appetite change, chills, diaphoresis, fever and unexpected weight change.  HENT: Negative for congestion, dental problem, drooling, ear pain, facial swelling, hearing loss, mouth sores, sneezing, sore throat, trouble swallowing and voice change.   Eyes: Negative for pain, discharge, redness, itching and visual disturbance.  Respiratory: Negative for cough, choking, shortness of breath and wheezing.   Cardiovascular: Negative for chest pain, palpitations and leg swelling.  Gastrointestinal: Negative for abdominal pain, blood in stool, constipation, diarrhea and vomiting.  Endocrine: Negative for cold intolerance, heat intolerance and polydipsia.  Genitourinary: Negative for decreased urine volume, dysuria and hematuria.  Musculoskeletal: Negative for arthralgias, back pain and gait problem.  Skin: Negative for rash.  Allergic/Immunologic: Negative for environmental allergies.  Neurological: Negative for seizures, syncope, light-headedness and headaches.  Hematological: Negative for adenopathy.  Psychiatric/Behavioral: Negative for agitation, dysphoric mood and suicidal ideas. The patient is not nervous/anxious.  Per HPI unless specifically indicated above     Objective:    BP 118/72 (BP Location: Left Arm, Patient Position: Sitting, Cuff Size: Large)   Pulse 98   Temp 97.9 F (36.6 C)   Ht 5\' 6"  (1.676 m)   Wt (!) 354 lb (160.6 kg)   SpO2 96%   BMI 57.14 kg/m   Wt Readings from Last 3 Encounters:  07/30/17 (!) 354 lb (160.6 kg)  07/28/17 (!) 356 lb (161.5 kg)  06/23/17  (!) 350 lb (158.8 kg)    Physical Exam  Constitutional: She is oriented to person, place, and time. She appears well-developed and well-nourished.  Eyes: Right conjunctiva is injected. Left conjunctiva is not injected.  R eye is much improved from Monday.  There is still mild redness and still some puffiness of the upper eyelid.  Good ROM.   The eye is watering some.  No purulent discharge seen.   Pulmonary/Chest: Effort normal.  Neurological: She is alert and oriented to person, place, and time.  Skin: Skin is warm and dry.  Psychiatric: She has a normal mood and affect. Her behavior is normal.  Nursing note and vitals reviewed.        Assessment & Plan:   Encounter Diagnosis  Name Primary?  . Acute conjunctivitis of right eye, unspecified acute conjunctivitis type Yes     -discussed with pt that she should be ready to return to work on Friday if her eye continues to improve at its current rate.  I told the pt that if her eye is not better, do not go to work and RTO on Tuesday next week (closed Monday for holiday). Note given -reminded pt of importance of her going to endocrinologist and podiatrist as scheduled -she will follow up  Here in October as scheduled.  RTO sooner prn

## 2017-07-31 LAB — MICROALBUMIN, URINE: MICROALB UR: 699.6 ug/mL — AB

## 2017-08-01 ENCOUNTER — Ambulatory Visit: Payer: No Typology Code available for payment source | Admitting: "Endocrinology

## 2017-08-03 ENCOUNTER — Emergency Department (HOSPITAL_COMMUNITY)
Admission: EM | Admit: 2017-08-03 | Discharge: 2017-08-04 | Disposition: A | Discharge status: Home / Self Care | Payer: Self-pay | Attending: Emergency Medicine | Admitting: Emergency Medicine

## 2017-08-03 ENCOUNTER — Encounter (HOSPITAL_COMMUNITY): Payer: Self-pay | Admitting: Emergency Medicine

## 2017-08-03 DIAGNOSIS — Z794 Long term (current) use of insulin: Secondary | ICD-10-CM | POA: Insufficient documentation

## 2017-08-03 DIAGNOSIS — E119 Type 2 diabetes mellitus without complications: Secondary | ICD-10-CM | POA: Insufficient documentation

## 2017-08-03 DIAGNOSIS — Z87891 Personal history of nicotine dependence: Secondary | ICD-10-CM | POA: Insufficient documentation

## 2017-08-03 DIAGNOSIS — H409 Unspecified glaucoma: Secondary | ICD-10-CM | POA: Insufficient documentation

## 2017-08-03 DIAGNOSIS — Z79899 Other long term (current) drug therapy: Secondary | ICD-10-CM | POA: Insufficient documentation

## 2017-08-03 MED ORDER — TETRACAINE HCL 0.5 % OP SOLN
2.0000 [drp] | Freq: Once | OPHTHALMIC | Status: AC
  Administered 2017-08-04: 2 [drp] via OPHTHALMIC
  Filled 2017-08-03: qty 4

## 2017-08-03 MED ORDER — FLUORESCEIN SODIUM 0.6 MG OP STRP
1.0000 | ORAL_STRIP | Freq: Once | OPHTHALMIC | Status: AC
  Administered 2017-08-04: 1 via OPHTHALMIC
  Filled 2017-08-03: qty 1

## 2017-08-03 NOTE — ED Triage Notes (Addendum)
Pt states that she has been having severe eye pain for 1 month, has been on antibiotic eye drops but not having improved pain.  Pt unable to keep eye open in triage and significant tearing present, no known injury, hx of macular degeneration   Pt states she has been having high blood pressure today, bp in triage 124/56

## 2017-08-04 MED ORDER — TIMOLOL MALEATE 0.5 % OP SOLN
1.0000 [drp] | Freq: Two times a day (BID) | OPHTHALMIC | Status: DC
  Administered 2017-08-04: 1 [drp] via OPHTHALMIC
  Filled 2017-08-04: qty 5

## 2017-08-04 MED ORDER — PREDNISOLONE ACETATE 1 % OP SUSP
1.0000 [drp] | Freq: Four times a day (QID) | OPHTHALMIC | Status: DC
  Administered 2017-08-04: 1 [drp] via OPHTHALMIC
  Filled 2017-08-04: qty 1

## 2017-08-04 MED ORDER — ACETAZOLAMIDE 250 MG PO TABS: 250.0000 mg | ORAL_TABLET | Freq: Four times a day (QID) | ORAL | 0 refills | Status: DC

## 2017-08-04 MED ORDER — PREDNISOLONE ACETATE 1 % OP SUSP
OPHTHALMIC | Status: AC
  Filled 2017-08-04: qty 5

## 2017-08-04 MED ORDER — ACETAZOLAMIDE 250 MG PO TABS
500.0000 mg | ORAL_TABLET | Freq: Once | ORAL | Status: AC
  Administered 2017-08-04: 500 mg via ORAL
  Filled 2017-08-04 (×2): qty 2

## 2017-08-04 NOTE — Discharge Instructions (Signed)
Use the prednisolone 1 drop in your right eye 4 times a day, the timolol 1 drop in your right eye twice a day and the diamox 250 mg 4 times a day. Call Dr Marnee SpringMcCuen's office to get a follow up appointment in the office either Monday (may be closed because of the holiday) or Tuesday, September 4. If your eye seems worse, you can call the office number and speak to the eye doctor on call.
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
## 2017-08-04 NOTE — ED Notes (Addendum)
During visual acuity pt states she is unable to see any lettering thru her right eye with or without her RX eye glasses, pt states she is only able to see the top letter (20/200) thru her left eye-reports same results with and without RX eye glasses

## 2017-08-20 ENCOUNTER — Encounter: Payer: Self-pay | Admitting: Podiatry

## 2017-08-20 ENCOUNTER — Ambulatory Visit: Payer: No Typology Code available for payment source | Admitting: Podiatry

## 2017-08-22 ENCOUNTER — Encounter (HOSPITAL_COMMUNITY): Payer: Self-pay | Admitting: Cardiology

## 2017-08-22 ENCOUNTER — Emergency Department (HOSPITAL_COMMUNITY)
Admission: EM | Admit: 2017-08-22 | Discharge: 2017-08-22 | Disposition: A | Discharge status: Home / Self Care | Payer: Self-pay | Attending: Emergency Medicine | Admitting: Emergency Medicine

## 2017-08-22 DIAGNOSIS — Z794 Long term (current) use of insulin: Secondary | ICD-10-CM | POA: Insufficient documentation

## 2017-08-22 DIAGNOSIS — E119 Type 2 diabetes mellitus without complications: Secondary | ICD-10-CM | POA: Insufficient documentation

## 2017-08-22 DIAGNOSIS — Y929 Unspecified place or not applicable: Secondary | ICD-10-CM | POA: Insufficient documentation

## 2017-08-22 DIAGNOSIS — Z79899 Other long term (current) drug therapy: Secondary | ICD-10-CM | POA: Insufficient documentation

## 2017-08-22 DIAGNOSIS — Z87891 Personal history of nicotine dependence: Secondary | ICD-10-CM | POA: Insufficient documentation

## 2017-08-22 DIAGNOSIS — Y999 Unspecified external cause status: Secondary | ICD-10-CM | POA: Insufficient documentation

## 2017-08-22 DIAGNOSIS — X58XXXA Exposure to other specified factors, initial encounter: Secondary | ICD-10-CM | POA: Insufficient documentation

## 2017-08-22 DIAGNOSIS — Y939 Activity, unspecified: Secondary | ICD-10-CM | POA: Insufficient documentation

## 2017-08-22 DIAGNOSIS — S0502XA Injury of conjunctiva and corneal abrasion without foreign body, left eye, initial encounter: Secondary | ICD-10-CM | POA: Insufficient documentation

## 2017-08-22 MED ORDER — TETRACAINE HCL 0.5 % OP SOLN
1.0000 [drp] | Freq: Once | OPHTHALMIC | Status: AC
  Administered 2017-08-22: 15:00:00 via OPHTHALMIC

## 2017-08-22 MED ORDER — ERYTHROMYCIN 5 MG/GM OP OINT: TOPICAL_OINTMENT | OPHTHALMIC | 0 refills | Status: DC

## 2017-08-22 MED ORDER — FLUORESCEIN SODIUM 0.6 MG OP STRP
1.0000 | ORAL_STRIP | Freq: Once | OPHTHALMIC | Status: AC
  Administered 2017-08-22: 1 via OPHTHALMIC
  Filled 2017-08-22: qty 1

## 2017-08-22 MED ORDER — TETRACAINE HCL 0.5 % OP SOLN
OPHTHALMIC | Status: AC
  Filled 2017-08-22: qty 4

## 2017-08-22 NOTE — ED Triage Notes (Signed)
Pt went to eye doctor yesterday.  Has macular degeneration in left eye.  C/o pressure behind left eye since appointment.

## 2017-08-22 NOTE — ED Provider Notes (Signed)
[facility] Provider Note   CSN: [id number] Arrival date & time: 08/22/17  1248     History   Chief Complaint Chief Complaint  Patient presents with  . Eye Problem    HPI Rachel George is a 36 y.o. female history significant for glaucoma right eye and diabetes presenting with progressive onset left eye discomfort and pressure which began last night and continued today. Seen by her optometrist yesterday and they obtain pressures which were normal and she feels that it is aggravated by all the testing that was done. She feels as though something is irritating her eye and scratching and has experienced watery discharge. Patient is not a contact lens wearer. She denies any new visual changes. No facial or periorbital swelling, headache, nausea, vomiting, fever or chills.  HPI  Past Medical History:  Diagnosis Date  . Diabetes mellitus without complication (HCC)    dx age 29  . Fatty liver disease, nonalcoholic   . Macular degeneration     Patient Active Problem List   Diagnosis Date Noted  . 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 Uncontrolled type 2 diabetes mellitus with hyperglycemia, with long-term current use of insulin (HCC) 05/01/2017  . Obesity, Class III, BMI 40-49.9 (morbid obesity) (HCC) 05/01/2017  . Morbid obesity with BMI of 50.0-59.9, adult (HCC) 05/01/2017  . Diabetic retinopathy associated with type 2 diabetes mellitus (HCC) 03/18/2017  . 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  . Osteomyelitis (HCC) 12/13/2016  . Diabetic foot ulcer (HCC) 10/15/2016  . Cellulitis of toe of right foot 10/07/2016  . Uncontrolled type 2 diabetes mellitus with complication (HCC) 09/09/2016  . Morbid obesity (HCC) 09/09/2016  . Diabetic polyneuropathy associated with type 2 diabetes mellitus (HCC) 09/09/2016    Past Surgical History:  Procedure Laterality Date  . AMPUTATION TOE Right 12/16/2016   Procedure: RIGHT FIRST TOE AMPUTATION;  Surgeon: Ancil Linsey, MD;   Location: [facility];  Service: General;  Laterality: Right;  Right first toe amputation for osteomyelitis    OB History    Gravida Para Term Preterm AB Living   0 0 0 0 0 0   SAB TAB Ectopic Multiple Live Births   0 0 0 0 0       Home Medications    Prior to Admission medications   Medication Sig Start Date End Date Taking? Authorizing Provider  acetaZOLAMIDE (DIAMOX) 250 MG tablet Take 1 tablet (250 mg total) by mouth QID. 08/04/17   Devoria Albe, MD  albuterol (PROVENTIL HFA;VENTOLIN HFA) 108 (90 Base) MCG/ACT inhaler Inhale 2 puffs into the lungs every 6 (six) hours as needed for wheezing or shortness of breath.    [provider]  Carboxymethylcellulose Sodium (LUBRICANT EYE DROPS OP) Apply 1 drop to eye daily as needed (for dry eye relief).     [provider]  erythromycin ophthalmic ointment Place a 1/2 inch ribbon of ointment into the lower eyelid 4 times a day. 08/22/17   Mathews Robinsons B, PA-C  gabapentin (NEURONTIN) 300 MG capsule Take 1 capsule (300 mg total) by mouth 2 (two) times daily. 03/18/17   Jacquelin Hawking, PA-C  ibuprofen (ADVIL,MOTRIN) 800 MG tablet Take 1 tablet (800 mg total) by mouth 3 (three) times daily. Patient not taking: Reported on 08/03/2017 06/23/17   Elson Areas, PA-C  insulin aspart (NOVOLOG) 100 UNIT/ML injection Inject 15 Units into the skin 3 (three) times daily before meals. 15 units plus sliding scales.    [provider]  insulin detemir (  LEVEMIR) 100 UNIT/ML injection Inject 55 Units into the skin at bedtime. Reported on 04/16/2016    [provider]  lovastatin (MEVACOR) 20 MG tablet Take 1 tablet (20 mg total) by mouth at bedtime. 04/24/17   Jacquelin Hawking, PA-C  metFORMIN (GLUCOPHAGE) 500 MG tablet Take 1 tablet (500 mg total) by mouth 2 (two) times daily with a meal. 02/06/17   Jacquelin Hawking, PA-C  sitaGLIPtin (JANUVIA) 100 MG tablet Take 1 tablet (100 mg total) by mouth daily. 01/01/17   Jacquelin Hawking, PA-C      Family History Family History  Problem Relation Age of Onset  . Diabetes Father     Social History Social History  Substance Use Topics  . Smoking status: Former Smoker    Packs/day: 0.25    Types: Cigarettes    Quit date: 12/11/2016  . Smokeless tobacco: Never Used  . Alcohol use No     Allergies   Shellfish allergy and Percocet [oxycodone-acetaminophen]   Review of Systems Review of Systems  Constitutional: Negative for chills and fever.  HENT: Negative for ear pain and sore throat.   Eyes: Positive for pain and discharge. Negative for photophobia, redness, itching and visual disturbance.  Respiratory: Negative for cough, shortness of breath, wheezing and stridor.   Cardiovascular: Negative for chest pain and palpitations.  Gastrointestinal: Negative for abdominal pain, nausea and vomiting.  Musculoskeletal: Negative for arthralgias and back pain.  Skin: Negative for color change, pallor and rash.  Neurological: Negative for dizziness, seizures, syncope, facial asymmetry, light-headedness and headaches.     Physical Exam Updated Vital Signs BP (!) 156/91 (BP Location: Left Arm)   Pulse 75   Temp 98.2 F (36.8 C) (Oral)   Resp 16   Ht  (1.702 m)   Wt (!) 154.2 kg (340 lb)   LMP 08/08/2017   SpO2 100%   BMI 53.25 kg/m   Physical Exam  Constitutional: She appears well-developed and well-nourished. No distress.  Afebrile, nontoxic-appearing, lying comfortably in bed in no acute distress.  HENT:  Head: Normocephalic and atraumatic.  Eyes: Pupils are equal, round, and reactive to light. EOM are normal. Right eye exhibits discharge. Left eye exhibits discharge.  No periorbital edema, warmth or erythema. Mild discomfort with EOM. clear discharge. Lids, lashes, lacrimals without lesions. Right conjunctiva is injected. Left conjunctiva and sclera white and non-injected. Cornea with fluorescein uptake at 6 o'clock. Anterior chamber is deep and normal  appearing. Irises are round and reactive. Lenses are clear. IOP LT 14, RT 40. 95% confidence     Visual Acuity  Right Eye Distance: reports blind right eye Left Eye Distance: 20/200 Bilateral Distance: 20/200     Neck: Normal range of motion. Neck supple.  Cardiovascular: Normal rate, regular rhythm and normal heart sounds.   No murmur heard. Pulmonary/Chest: Effort normal and breath sounds normal. No respiratory distress. She has no wheezes. She has no rales.  Musculoskeletal: She exhibits no edema.  Neurological: She is alert.  Skin: Skin is warm and dry. No rash noted. She is not diaphoretic. No erythema. No pallor.  Psychiatric: She has a normal mood and affect.  Nursing note and vitals reviewed.    ED Treatments / Results  Labs (all labs ordered are listed, but only abnormal results are displayed) Labs Reviewed - No data to display  EKG  EKG Interpretation None       Radiology No results found.  Procedures Procedures (including critical care time)  Medications Ordered in ED  Medications  fluorescein ophthalmic strip 1 strip (1 strip Left Eye Given by Other 08/22/17 1513)  tetracaine (PONTOCAINE) 0.5 % ophthalmic solution 1 drop ( Left Eye Given by Other 08/22/17 1513)     Initial Impression / Assessment and Plan / ED Course  I have reviewed the triage vital signs and the nursing notes.  Pertinent labs & imaging results that were available during my care of the patient were reviewed by me and considered in my medical decision making (see chart for details).      Patient presents with left eye pain/pressure. Reassuring exam, sclerae noninjected, no new visual disturbances, intraocular pressure is normal. Mild fluorescein uptake at 6:00 on the left cornea.  Pt with corneal abrasion on PE. No evidence of FB.  No change in vision, acuity at baseline for patient.  Pt is not a contact lens wearer.  Exam non-concerning for orbital cellulitis, hyphema. Patient will  be discharged home with erythromycin.     Patient understands to follow up with ophthalmology, & to return to ER if new symptoms develop including change in vision, purulent drainage, or entrapment.  Discussed strict return precautions and advised to return to the emergency department if experiencing any new or worsening symptoms. Instructions were understood and patient agreed with discharge plan.  Final Clinical Impressions(s) / ED Diagnoses   Final diagnoses:  Abrasion of left cornea, initial encounter    New Prescriptions Discharge Medication List as of 08/22/2017  3:20 PM       Georgiana Shore, PA-C 08/22/17 1628    Loren Racer, MD 08/23/17 [phone]

## 2017-08-22 NOTE — Discharge Instructions (Signed)
As discussed, make sure that you follow-up with ophthalmology. Apply erythromycin ointment to your left eye 4 times a day.  Return if symptoms worsen in the meantime.

## 2017-09-01 ENCOUNTER — Ambulatory Visit: Payer: Self-pay | Admitting: Physician Assistant

## 2017-09-08 ENCOUNTER — Encounter: Payer: Self-pay | Admitting: Physician Assistant

## 2017-09-08 ENCOUNTER — Ambulatory Visit: Payer: Self-pay | Admitting: Physician Assistant

## 2017-09-08 VITALS — BP 136/74 | HR 99 | Temp 97.5°F | Ht 66.0 in | Wt 347.5 lb

## 2017-09-08 DIAGNOSIS — E11319 Type 2 diabetes mellitus with unspecified diabetic retinopathy without macular edema: Secondary | ICD-10-CM

## 2017-09-08 DIAGNOSIS — F39 Unspecified mood [affective] disorder: Secondary | ICD-10-CM

## 2017-09-08 DIAGNOSIS — Z899 Acquired absence of limb, unspecified: Secondary | ICD-10-CM

## 2017-09-08 DIAGNOSIS — E1142 Type 2 diabetes mellitus with diabetic polyneuropathy: Secondary | ICD-10-CM

## 2017-09-08 DIAGNOSIS — E1069 Type 1 diabetes mellitus with other specified complication: Secondary | ICD-10-CM

## 2017-09-08 DIAGNOSIS — Z9119 Patient's noncompliance with other medical treatment and regimen: Secondary | ICD-10-CM

## 2017-09-08 DIAGNOSIS — R112 Nausea with vomiting, unspecified: Secondary | ICD-10-CM

## 2017-09-08 DIAGNOSIS — E118 Type 2 diabetes mellitus with unspecified complications: Secondary | ICD-10-CM

## 2017-09-08 DIAGNOSIS — E1165 Type 2 diabetes mellitus with hyperglycemia: Secondary | ICD-10-CM

## 2017-09-08 DIAGNOSIS — Z794 Long term (current) use of insulin: Secondary | ICD-10-CM

## 2017-09-08 DIAGNOSIS — Z91199 Patient's noncompliance with other medical treatment and regimen due to unspecified reason: Secondary | ICD-10-CM

## 2017-09-08 DIAGNOSIS — IMO0002 Reserved for concepts with insufficient information to code with codable children: Secondary | ICD-10-CM

## 2017-09-08 LAB — GLUCOSE, POCT (MANUAL RESULT ENTRY): POC Glucose: 294 mg/dl — AB (ref 70–99)

## 2017-09-08 MED ORDER — PROMETHAZINE HCL 25 MG/ML IJ SOLN
25.0000 mg | Freq: Once | INTRAMUSCULAR | Status: AC
  Administered 2017-09-08: 25 mg via INTRAVENOUS

## 2017-09-08 MED ORDER — PROMETHAZINE HCL 25 MG PO TABS: 25.0000 mg | ORAL_TABLET | Freq: Three times a day (TID) | ORAL | 0 refills | Status: DC | PRN

## 2017-09-08 NOTE — Patient Instructions (Addendum)
Podiatrist 10/19 as scheduled  Reschedule endocrinology appt- [phone]    Nausea and Vomiting, Adult Nausea is the feeling that you have an upset stomach or have to vomit. As nausea gets worse, it can lead to vomiting. Vomiting occurs when stomach contents are thrown up and out of the mouth. Vomiting can make you feel weak and cause you to become dehydrated. Dehydration can make you tired and thirsty, cause you to have a dry mouth, and decrease how often you urinate. Older adults and people with other diseases or a weak immune system are at higher risk for dehydration. It is important to treat your nausea and vomiting as told by your health care provider. Follow these instructions at home: Follow instructions from your health care provider about how to care for yourself at home. Eating and drinking Follow these recommendations as told by your health care provider:  Take an oral rehydration solution (ORS). This is a drink that is sold at pharmacies and retail stores.  Drink clear fluids in small amounts as you are able. Clear fluids include water, ice chips, diluted fruit juice, and low-calorie sports drinks.  Eat bland, easy-to-digest foods in small amounts as you are able. These foods include bananas, applesauce, rice, lean meats, toast, and crackers.  Avoid fluids that contain a lot of sugar or caffeine, such as energy drinks, sports drinks, and soda.  Avoid alcohol.  Avoid spicy or fatty foods.  General instructions  Drink enough fluid to keep your urine clear or pale yellow.  Wash your hands often. If soap and water are not available, use hand sanitizer.  Make sure that all people in your household wash their hands well and often.  Take over-the-counter and prescription medicines only as told by your health care provider.  Rest at home while you recover.  Watch your condition for any changes.  Breathe slowly and deeply when you feel nauseated.  Keep all follow-up  visits as told by your health care provider. This is important. Contact a health care provider if:  You have a fever.  You cannot keep fluids down.  Your symptoms get worse.  You have new symptoms.  Your nausea does not go away after two days.  You feel light-headed or dizzy.  You have a headache.  You have muscle cramps. Get help right away if:  You have pain in your chest, neck, arm, or jaw.  You feel extremely weak or you faint.  You have persistent vomiting.  You see blood in your vomit.  Your vomit looks like black coffee grounds.  You have bloody or black stools or stools that look like tar.  You have a severe headache, a stiff neck, or both.  You have a rash.  You have severe pain, cramping, or bloating in your abdomen.  You have trouble breathing or you are breathing very quickly.  Your heart is beating very quickly.  Your skin feels cold and clammy.  You feel confused.  You have pain when you urinate.  You have signs of dehydration, such as: ? Dark urine, very little urine, or no urine. ? Cracked lips. ? Dry mouth. ? Sunken eyes. ? Sleepiness. ? Weakness. These symptoms may represent a serious problem that is an emergency. Do not wait to see if the symptoms will go away. Get medical help right away. Call your local emergency services (911 in the [place] Do not drive yourself to the hospital. This information is not intended to replace advice given to  you by your health care provider. Make sure you discuss any questions you have with your health care provider. Document Released: 11/18/2005 Document Revised: 04/22/2016 Document Reviewed: 07/25/2015 Elsevier Interactive Patient Education  2017 [employer].

## 2017-09-08 NOTE — Progress Notes (Signed)
BP 136/74 (BP Location: Left Arm, Patient Position: Sitting, Cuff Size: Large)   Pulse 99   Temp (!) 97.5 F (36.4 C) (Other (Comment))   Ht  (1.676 m)   Wt (!) 347 lb 8 oz (157.6 kg)   LMP 09/06/2017 (Exact Date)   SpO2 97%   BMI 56.09 kg/m    Subjective:    Patient ID: Rachel George, female    DOB: 12-20-1980, 36 y.o.   MRN: [id number]  HPI: Rachel George is a 36 y.o. female presenting on 09/08/2017 for Follow-up (c/o nausea and vomiting since early this morning)   HPI  Pt is here for rountine follow up but she says she started with the nausea and vomiting this morning.   She has thrown up more than 3 times.  She had diarrhea x 2.  No blood in diarrhea or emesis.   No fever  Discussed with pt that she has been continually and habitually noncompliant- (no-showed multiple times to wound clinic which may have contributed to pt ultimately losing part of her foot, she has missed blood-draws/labs, she has missed 6 appointments with the podiatrist- some no-show and some cancelled, and she no-showed to her endocrinology appt for 08/01/17.  )  Pt has family planning medicaid  Pt says she is seeing specialist for her diabetic eye problems  Relevant past medical, surgical, family and social history reviewed and updated as indicated. Interim medical history since our last visit reviewed. Allergies and medications reviewed and updated.   Current Outpatient Prescriptions:  .  albuterol (PROVENTIL HFA;VENTOLIN HFA) 108 (90 Base) MCG/ACT inhaler, Inhale 2 puffs into the lungs every 6 (six) hours as needed for wheezing or shortness of breath., Disp: , Rfl:  .  gabapentin (NEURONTIN) 300 MG capsule, Take 1 capsule (300 mg total) by mouth 2 (two) times daily., Disp: 180 capsule, Rfl: 0 .  insulin aspart (NOVOLOG) 100 UNIT/ML injection, Inject 15 Units into the skin 3 (three) times daily before meals. 15 units plus sliding scales., Disp: , Rfl:  .  insulin detemir (LEVEMIR) 100 UNIT/ML  injection, Inject 55 Units into the skin at bedtime. Reported on 04/16/2016, Disp: , Rfl:  .  lovastatin (MEVACOR) 20 MG tablet, Take 1 tablet (20 mg total) by mouth at bedtime., Disp: 30 tablet, Rfl: 3 .  metFORMIN (GLUCOPHAGE) 500 MG tablet, Take 1 tablet (500 mg total) by mouth 2 (two) times daily with a meal., Disp: 180 tablet, Rfl: 1 .  sitaGLIPtin (JANUVIA) 100 MG tablet, Take 1 tablet (100 mg total) by mouth daily., Disp: 90 tablet, Rfl: 2   Review of Systems  Constitutional: Positive for fatigue. Negative for fever.  Respiratory: Negative for shortness of breath.   Cardiovascular: Negative for chest pain.  Gastrointestinal: Positive for abdominal pain, diarrhea, nausea and vomiting.  Genitourinary: Negative for dysuria.    Per HPI unless specifically indicated above     Objective:    BP 136/74 (BP Location: Left Arm, Patient Position: Sitting, Cuff Size: Large)   Pulse 99   Temp (!) 97.5 F (36.4 C) (Other (Comment))   Ht  (1.676 m)   Wt (!) 347 lb 8 oz (157.6 kg)   LMP 09/06/2017 (Exact Date)   SpO2 97%   BMI 56.09 kg/m   Wt Readings from Last 3 Encounters:  09/08/17 (!) 347 lb 8 oz (157.6 kg)  08/22/17 (!) 340 lb (154.2 kg)  08/03/17 (!) 354 lb (160.6 kg)    Physical Exam  Constitutional: She  is oriented to person, place, and time. She appears well-developed and well-nourished.  HENT:  Head: Normocephalic and atraumatic.  Neck: Neck supple.  Cardiovascular: Normal rate and regular rhythm.   Pulmonary/Chest: Effort normal and breath sounds normal.  Abdominal: Soft. Bowel sounds are normal. She exhibits no distension and no mass. There is no hepatosplenomegaly. There is generalized tenderness. There is no rebound, no guarding and no CVA tenderness.  Musculoskeletal: She exhibits no edema.  Lymphadenopathy:    She has no cervical adenopathy.  Neurological: She is alert and oriented to person, place, and time.  Skin: Skin is warm and dry.  Feet without  ulceration.  Pt is wearing shoes without socks.    Psychiatric: She has a normal mood and affect. Her behavior is normal.  Vitals reviewed.   Results for orders placed or performed in visit on 09/08/17  POCT Glucose (CBG)  Result Value Ref Range   POC Glucose 294 (A) 70 - 99 mg/dl      Assessment & Plan:    Encounter Diagnoses  Name Primary?  . Personal history of noncompliance with medical treatment, presenting hazards to health Yes  . Uncontrolled type 2 diabetes mellitus with complication, with long-term current use of insulin (HCC)   . Type 1 diabetes mellitus with other specified complication (HCC)   . Non-intractable vomiting with nausea, unspecified vomiting type   . Diabetic polyneuropathy associated with type 2 diabetes mellitus (HCC)   . Diabetic retinopathy associated with type 2 diabetes mellitus, macular edema presence unspecified, unspecified laterality, unspecified retinopathy severity (HCC)   . S/P amputation   . Morbid obesity (HCC)   . Mood disorder (HCC)     -counseled pt on need to wear socks and appropriate shoes for her diabetic feet -pt is given phenergan in office (her husband is driving) and rx for phenergan.  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  Counseled on rest, fluids.  Pt is given reading information as well.   -discussed with pt at length that her continuing noncompliance is not acceptable.  She MUST go to her podiatrist appointment scheduled for 09/19/17 or I will dismiss her for persistent noncompliance.  She also needs to call the endocrinologist (she has the number) and rescheduled the appointment that she no-showed in august.  Pt says she understands -no changes to medications today in light of pt illness. Pt to monitor her bs -follow up 1 month. RTO sooner prn worsening or if fails to resolve

## 2017-09-10 ENCOUNTER — Emergency Department (HOSPITAL_COMMUNITY)
Admission: EM | Admit: 2017-09-10 | Discharge: 2017-09-10 | Disposition: A | Discharge status: Home / Self Care | Payer: Self-pay | Attending: Emergency Medicine | Admitting: Emergency Medicine

## 2017-09-10 ENCOUNTER — Emergency Department (HOSPITAL_COMMUNITY): Payer: Self-pay

## 2017-09-10 ENCOUNTER — Encounter (HOSPITAL_COMMUNITY): Payer: Self-pay

## 2017-09-10 DIAGNOSIS — N12 Tubulo-interstitial nephritis, not specified as acute or chronic: Secondary | ICD-10-CM | POA: Insufficient documentation

## 2017-09-10 DIAGNOSIS — E349 Endocrine disorder, unspecified: Secondary | ICD-10-CM | POA: Insufficient documentation

## 2017-09-10 DIAGNOSIS — R05 Cough: Secondary | ICD-10-CM | POA: Insufficient documentation

## 2017-09-10 DIAGNOSIS — R739 Hyperglycemia, unspecified: Secondary | ICD-10-CM

## 2017-09-10 DIAGNOSIS — E1165 Type 2 diabetes mellitus with hyperglycemia: Secondary | ICD-10-CM | POA: Insufficient documentation

## 2017-09-10 DIAGNOSIS — Z87891 Personal history of nicotine dependence: Secondary | ICD-10-CM | POA: Insufficient documentation

## 2017-09-10 DIAGNOSIS — Z79899 Other long term (current) drug therapy: Secondary | ICD-10-CM | POA: Insufficient documentation

## 2017-09-10 DIAGNOSIS — Z794 Long term (current) use of insulin: Secondary | ICD-10-CM | POA: Insufficient documentation

## 2017-09-10 LAB — CBC
HCT: 37.9 % (ref 36.0–46.0)
HEMOGLOBIN: 12.3 g/dL (ref 12.0–15.0)
MCH: 25.6 pg — AB (ref 26.0–34.0)
MCHC: 32.5 g/dL (ref 30.0–36.0)
MCV: 79 fL (ref 78.0–100.0)
PLATELETS: 198 10*3/uL (ref 150–400)
RBC: 4.8 MIL/uL (ref 3.87–5.11)
RDW: 14.4 % (ref 11.5–15.5)
WBC: 11.7 10*3/uL — ABNORMAL HIGH (ref 4.0–10.5)

## 2017-09-10 LAB — COMPREHENSIVE METABOLIC PANEL
ALBUMIN: 2.9 g/dL — AB (ref 3.5–5.0)
ALK PHOS: 85 U/L (ref 38–126)
ALT: 9 U/L — AB (ref 14–54)
AST: 11 U/L — ABNORMAL LOW (ref 15–41)
Anion gap: 9 (ref 5–15)
BUN: 11 mg/dL (ref 6–20)
CALCIUM: 8.4 mg/dL — AB (ref 8.9–10.3)
CHLORIDE: 102 mmol/L (ref 101–111)
CO2: 24 mmol/L (ref 22–32)
CREATININE: 0.98 mg/dL (ref 0.44–1.00)
GFR calc non Af Amer: >60 mL/min (ref >60)
GLUCOSE: 239 mg/dL — AB (ref 65–99)
Potassium: 3.8 mmol/L (ref 3.5–5.1)
Sodium: 135 mmol/L (ref 135–145)
Total Bilirubin: 0.5 mg/dL (ref 0.3–1.2)
Total Protein: 7.1 g/dL (ref 6.5–8.1)

## 2017-09-10 LAB — URINALYSIS, ROUTINE W REFLEX MICROSCOPIC
BILIRUBIN URINE: NEGATIVE
Glucose, UA: NEGATIVE mg/dL
KETONES UR: 5 mg/dL — AB
Nitrite: POSITIVE — AB
Specific Gravity, Urine: 1.014 (ref 1.005–1.030)
pH: 5 (ref 5.0–8.0)

## 2017-09-10 LAB — I-STAT BETA HCG BLOOD, ED (MC, WL, AP ONLY): HCG, QUANTITATIVE: 20.4 m[IU]/mL — AB (ref <5)

## 2017-09-10 LAB — LIPASE, BLOOD: LIPASE: 18 U/L (ref 11–51)

## 2017-09-10 MED ORDER — ONDANSETRON HCL 4 MG/2ML IJ SOLN
4.0000 mg | Freq: Once | INTRAMUSCULAR | Status: AC
  Administered 2017-09-10: 4 mg via INTRAVENOUS
  Filled 2017-09-10: qty 2

## 2017-09-10 MED ORDER — SODIUM CHLORIDE 0.9 % IV BOLUS (SEPSIS)
1000.0000 mL | Freq: Once | INTRAVENOUS | Status: AC
  Administered 2017-09-10: 1000 mL via INTRAVENOUS

## 2017-09-10 MED ORDER — CEPHALEXIN 500 MG PO CAPS: 500.0000 mg | ORAL_CAPSULE | Freq: Four times a day (QID) | ORAL | 0 refills | Status: AC

## 2017-09-10 MED ORDER — METOCLOPRAMIDE HCL 10 MG PO TABS: 10.0000 mg | ORAL_TABLET | Freq: Three times a day (TID) | ORAL | 0 refills | Status: DC | PRN

## 2017-09-10 MED ORDER — DEXTROSE 5 % IV SOLN
1.0000 g | Freq: Once | INTRAVENOUS | Status: AC
  Administered 2017-09-10: 1 g via INTRAVENOUS
  Filled 2017-09-10: qty 10

## 2017-09-10 MED ORDER — ACETAMINOPHEN 325 MG PO TABS
650.0000 mg | ORAL_TABLET | Freq: Once | ORAL | Status: AC
  Administered 2017-09-10: 650 mg via ORAL
  Filled 2017-09-10: qty 2

## 2017-09-10 NOTE — Discharge Instructions (Signed)
Follow-up with your primary care doctor next week to make sure the urinary tract infection is improving. As we discussed, your blood pregnancy test was higher than normal.  I suspect this is a false positive test but this should be rechecked in a week or so.  In the meantime, do not take any over the counter medications, avoid alcohol.  The prescribed medications are safe to take.  You can also safely take tylenol.

## 2017-09-10 NOTE — ED Triage Notes (Signed)
Pt c/o vomiting, diarrhea, and generalized body aches since Monday.

## 2017-09-10 NOTE — ED Provider Notes (Signed)
[facility]Y [facility]PT Provider Note   CSN: [id number] Arrival date & time: 09/10/17  1307     History   Chief Complaint Chief Complaint  Patient presents with  . Emesis  . Diarrhea    HPI Rachel George is a 36 y.o. female.  HPI Patient presents to the emergency room for evaluation of fever, diarrhea. Patient states she started having trouble with body aches on Monday. She's had several episodes of vomiting, greater than 5 per day.  She has had an occasional episode of loose stool as well. She denies any burning with urination or dysuria. She has been coughing. She does have body aches and a headache. Patient did get her flu shot this year.  Her symptoms have worsened, she feels very fatigued so she came to the ED. Past Medical History:  Diagnosis Date  . Diabetes mellitus without complication (HCC)    dx age 73  . Fatty liver disease, nonalcoholic   . Macular degeneration     Patient Active Problem List   Diagnosis Date Noted  . 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 Uncontrolled type 2 diabetes mellitus with hyperglycemia, with long-term current use of insulin (HCC) 05/01/2017  . Obesity, Class III, BMI 40-49.9 (morbid obesity) (HCC) 05/01/2017  . Morbid obesity with BMI of 50.0-59.9, adult (HCC) 05/01/2017  . Diabetic retinopathy associated with type 2 diabetes mellitus (HCC) 03/18/2017  . 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  . Osteomyelitis (HCC) 12/13/2016  . Diabetic foot ulcer (HCC) 10/15/2016  . Cellulitis of toe of right foot 10/07/2016  . Uncontrolled type 2 diabetes mellitus with complication (HCC) 09/09/2016  . Morbid obesity (HCC) 09/09/2016  . Diabetic polyneuropathy associated with type 2 diabetes mellitus (HCC) 09/09/2016    Past Surgical History:  Procedure Laterality Date  . AMPUTATION TOE Right 12/16/2016   Procedure: RIGHT FIRST TOE AMPUTATION;  Surgeon: Ancil Linsey, MD;  Location: [facility];  Service: General;  Laterality: Right;  Right first toe  amputation for osteomyelitis    OB History    Gravida Para Term Preterm AB Living   0 0 0 0 0 0   SAB TAB Ectopic Multiple Live Births   0 0 0 0 0       Home Medications    Prior to Admission medications   Medication Sig Start Date End Date Taking? Authorizing Provider  gabapentin (NEURONTIN) 300 MG capsule Take 1 capsule (300 mg total) by mouth 2 (two) times daily. 03/18/17  Yes Jacquelin Hawking, PA-C  lovastatin (MEVACOR) 20 MG tablet Take 1 tablet (20 mg total) by mouth at bedtime. 04/24/17  Yes Jacquelin Hawking, PA-C  metFORMIN (GLUCOPHAGE) 500 MG tablet Take 1 tablet (500 mg total) by mouth 2 (two) times daily with a meal. 02/06/17  Yes Jacquelin Hawking, PA-C  PRESCRIPTION MEDICATION Place 1 drop into the right eye 2 (two) times daily. Eye drop for glaucoma she received here at the hospital   Yes [provider]  sitaGLIPtin (JANUVIA) 100 MG tablet Take 1 tablet (100 mg total) by mouth daily. 01/01/17  Yes Jacquelin Hawking, PA-C  albuterol (PROVENTIL HFA;VENTOLIN HFA) 108 (90 Base) MCG/ACT inhaler Inhale 2 puffs into the lungs every 6 (six) hours as needed for wheezing or shortness of breath.    [provider]  cephALEXin (KEFLEX) 500 MG capsule Take 1 capsule (500 mg total) by mouth 4 (four) times daily. 09/10/17 09/20/17  Linwood Dibbles, MD  insulin aspart (NOVOLOG) 100 UNIT/ML injection Inject 15 Units into the skin 3 (  three) times daily before meals. 15 units plus sliding scales.    [provider]  insulin detemir (LEVEMIR) 100 UNIT/ML injection Inject 55 Units into the skin at bedtime. Reported on 04/16/2016    [provider]  metoCLOPramide (REGLAN) 10 MG tablet Take 1 tablet (10 mg total) by mouth every 8 (eight) hours as needed for nausea or vomiting. 09/10/17   Linwood Dibbles, MD  promethazine (PHENERGAN) 25 MG tablet Take 1 tablet (25 mg total) by mouth every 8 (eight) hours as needed for nausea or vomiting. 09/08/17   Jacquelin Hawking, PA-C     Family History Family History  Problem Relation Age of Onset  . Diabetes Father     Social History Social History  Substance Use Topics  . Smoking status: Former Smoker    Packs/day: 0.25    Types: Cigarettes    Quit date: 12/11/2016  . Smokeless tobacco: Never Used  . Alcohol use No     Allergies   Shellfish allergy and Percocet [oxycodone-acetaminophen]   Review of Systems Review of Systems  All other systems reviewed and are negative.    Physical Exam Updated Vital Signs BP 127/75 (BP Location: Right Arm)   Pulse (!) 108   Temp 99.8 F (37.7 C) (Oral)   Resp (!) 22   Ht 1.676 m ( )   Wt 131.5 kg (290 lb)   LMP 09/06/2017 (Exact Date)   SpO2 100%   BMI 46.81 kg/m   Physical Exam  Constitutional: No distress.  Obese   HENT:  Head: Normocephalic and atraumatic.  Right Ear: External ear normal.  Left Ear: External ear normal.  Mouth/Throat: No oropharyngeal exudate.  Eyes: Conjunctivae are normal. Right eye exhibits no discharge. Left eye exhibits no discharge. No scleral icterus.  Neck: Neck supple. No tracheal deviation present.  Cardiovascular: Normal rate, regular rhythm and intact distal pulses.   Pulmonary/Chest: Effort normal and breath sounds normal. No stridor. No respiratory distress. She has no wheezes. She has no rales.  Abdominal: Soft. Bowel sounds are normal. She exhibits no distension. There is no tenderness. There is no rebound and no guarding.  Musculoskeletal: She exhibits no edema or tenderness.  Neurological: She is alert. She has normal strength. No cranial nerve deficit (no facial droop, extraocular movements intact, no slurred speech) or sensory deficit. She exhibits normal muscle tone. She displays no seizure activity. Coordination normal.  Skin: Skin is warm and dry. No rash noted.  Psychiatric: She has a normal mood and affect.  Nursing note and vitals reviewed.    ED Treatments / Results  Labs (all labs ordered are  listed, but only abnormal results are displayed) Labs Reviewed  COMPREHENSIVE METABOLIC PANEL - Abnormal; Notable for the following:       Result Value   Glucose, Bld 239 (*)    Calcium 8.4 (*)    Albumin 2.9 (*)    AST 11 (*)    ALT 9 (*)    All other components within normal limits  CBC - Abnormal; Notable for the following:    WBC 11.7 (*)    MCH 25.6 (*)    All other components within normal limits  URINALYSIS, ROUTINE W REFLEX MICROSCOPIC - Abnormal; Notable for the following:    APPearance CLOUDY (*)    Hgb urine dipstick SMALL (*)    Ketones, ur 5 (*)    Protein, ur >=300 (*)    Nitrite POSITIVE (*)    Leukocytes, UA LARGE (*)  Bacteria, UA MANY (*)    Squamous Epithelial / LPF 0-5 (*)    Non Squamous Epithelial 0-5 (*)    All other components within normal limits  I-STAT BETA HCG BLOOD, ED ([facility], [facility], [facility] ONLY) - Abnormal; Notable for the following:    I-stat hCG, quantitative 20.4 (*)    All other components within normal limits  LIPASE, BLOOD    Radiology Dg Chest 2 View  Result Date: 09/10/2017 CLINICAL DATA:  Fever, cough. EXAM: CHEST  2 VIEW COMPARISON:  Radiographs of November 20, 2016. FINDINGS: The heart size and mediastinal contours are within normal limits. Both lungs are clear. No pneumothorax or pleural effusion is noted. The visualized skeletal structures are unremarkable. IMPRESSION: No active cardiopulmonary disease. Electronically Signed   By: Lupita Raider, M.D.   On: 09/10/2017 14:21    Procedures Procedures (including critical care time)  Medications Ordered in ED Medications  cefTRIAXone (ROCEPHIN) 1 g in dextrose 5 % 50 mL IVPB (1 g Intravenous New Bag/Given 09/10/17 1716)  sodium chloride 0.9 % bolus 1,000 mL (0 mLs Intravenous Stopped 09/10/17 1548)  acetaminophen (TYLENOL) tablet 650 mg (650 mg Oral Given 09/10/17 1403)  ondansetron (ZOFRAN) injection 4 mg (4 mg Intravenous Given 09/10/17 1404)     Initial Impression / Assessment and  Plan / ED Course  I have reviewed the triage vital signs and the nursing notes.  Pertinent labs & imaging results that were available during my care of the patient were reviewed by me and considered in my medical decision making (see chart for details).  Clinical Course as of Sep 10 1742  Wed Sep 10, 2017  1646 Labs reviewed.  HCG is 20.  Suspect this is a false positive but when over the result with the patient.  Labs notable for a uti.  Otherwise reassuring.  Will give a dose of rocephin.  Sx consistent with pyelo.  [JK]    Clinical Course User Index [JK] Linwood Dibbles, MD    She presented to the emergency room with fever and abdominal pain as well as generalized body aches. Laboratory tests are notable for urinary tract infection.  Her presentation is consistent with pyelonephritis. Patient's laboratory tests are otherwise reassuring. Her vitals are stable. She was given IV fluids and antibiotics. Plan on discharge home with oral antibiotics.  We discussed the elevated hCG level. I suspect it is most likely a false positive however we discussed having this rechecked in the next week or so and to avoid any medications that could be unsafe during pregnancy.  Final Clinical Impressions(s) / ED Diagnoses   Final diagnoses:  Pyelonephritis  Elevated serum hCG  Hyperglycemia    New Prescriptions New Prescriptions   CEPHALEXIN (KEFLEX) 500 MG CAPSULE    Take 1 capsule (500 mg total) by mouth 4 (four) times daily.   METOCLOPRAMIDE (REGLAN) 10 MG TABLET    Take 1 tablet (10 mg total) by mouth every 8 (eight) hours as needed for nausea or vomiting.     Linwood Dibbles, MD 09/10/17 [phone]

## 2017-09-16 DIAGNOSIS — H2513 Age-related nuclear cataract, bilateral: Secondary | ICD-10-CM | POA: Insufficient documentation

## 2017-09-16 DIAGNOSIS — H21541 Posterior synechiae (iris), right eye: Secondary | ICD-10-CM | POA: Insufficient documentation

## 2017-09-16 DIAGNOSIS — H4051X2 Glaucoma secondary to other eye disorders, right eye, moderate stage: Secondary | ICD-10-CM | POA: Insufficient documentation

## 2017-09-19 ENCOUNTER — Ambulatory Visit: Payer: No Typology Code available for payment source | Admitting: Podiatry

## 2017-09-19 ENCOUNTER — Encounter: Payer: Self-pay | Admitting: Podiatry

## 2017-10-07 DIAGNOSIS — E782 Mixed hyperlipidemia: Secondary | ICD-10-CM | POA: Insufficient documentation

## 2017-10-13 ENCOUNTER — Ambulatory Visit: Payer: Self-pay | Admitting: Physician Assistant

## 2017-10-13 ENCOUNTER — Other Ambulatory Visit (HOSPITAL_COMMUNITY)
Admission: RE | Admit: 2017-10-13 | Discharge: 2017-10-13 | Disposition: A | Discharge status: Home / Self Care | Payer: Self-pay | Source: Ambulatory Visit | Attending: Physician Assistant | Admitting: Physician Assistant

## 2017-10-13 ENCOUNTER — Encounter: Payer: Self-pay | Admitting: Physician Assistant

## 2017-10-13 VITALS — BP 132/80 | HR 65 | Temp 97.3°F | Wt 343.8 lb

## 2017-10-13 DIAGNOSIS — Z91199 Patient's noncompliance with other medical treatment and regimen due to unspecified reason: Secondary | ICD-10-CM

## 2017-10-13 DIAGNOSIS — E1165 Type 2 diabetes mellitus with hyperglycemia: Secondary | ICD-10-CM | POA: Insufficient documentation

## 2017-10-13 DIAGNOSIS — IMO0002 Reserved for concepts with insufficient information to code with codable children: Secondary | ICD-10-CM

## 2017-10-13 DIAGNOSIS — Z899 Acquired absence of limb, unspecified: Secondary | ICD-10-CM

## 2017-10-13 DIAGNOSIS — Z794 Long term (current) use of insulin: Principal | ICD-10-CM

## 2017-10-13 DIAGNOSIS — Z9119 Patient's noncompliance with other medical treatment and regimen: Secondary | ICD-10-CM

## 2017-10-13 DIAGNOSIS — E118 Type 2 diabetes mellitus with unspecified complications: Principal | ICD-10-CM

## 2017-10-13 DIAGNOSIS — E11319 Type 2 diabetes mellitus with unspecified diabetic retinopathy without macular edema: Secondary | ICD-10-CM

## 2017-10-13 DIAGNOSIS — E1142 Type 2 diabetes mellitus with diabetic polyneuropathy: Secondary | ICD-10-CM

## 2017-10-13 LAB — BASIC METABOLIC PANEL
Anion gap: 5 (ref 5–15)
BUN: 18 mg/dL (ref 6–20)
CALCIUM: 8.8 mg/dL — AB (ref 8.9–10.3)
CHLORIDE: 104 mmol/L (ref 101–111)
CO2: 27 mmol/L (ref 22–32)
CREATININE: 0.85 mg/dL (ref 0.44–1.00)
GFR calc Af Amer: >60 mL/min (ref >60)
GFR calc non Af Amer: >60 mL/min (ref >60)
GLUCOSE: 219 mg/dL — AB (ref 65–99)
Potassium: 4.1 mmol/L (ref 3.5–5.1)
Sodium: 136 mmol/L (ref 135–145)

## 2017-10-13 LAB — HEMOGLOBIN A1C
Hgb A1c MFr Bld: 9.3 % — ABNORMAL HIGH (ref 4.8–5.6)
Mean Plasma Glucose: 220.21 mg/dL

## 2017-10-13 NOTE — Patient Instructions (Signed)
[place] [facility] [phone] 

## 2017-10-13 NOTE — Progress Notes (Signed)
BP 132/80 (BP Location: Left Arm, Patient Position: Sitting, Cuff Size: Normal)   Pulse 65   Temp (!) 97.3 F (36.3 C)   Wt (!) 343 lb 12 oz (155.9 kg)   SpO2 99%   BMI 55.48 kg/m    Subjective:    Patient ID: Rachel BarkerKaren Kleen, female    DOB: Jun 04, 1981, 36 y.o.   MRN: [id number]  HPI: Rachel George is a 36 y.o. female presenting on 10/13/2017 for Follow-up   HPI   Pt has applied for medicaid again 2 wk ago and should be hearing on that soon.   Pt with long history of noncompliance with treatment and no-showing for appointments, especially with specialists.    She has still not seen podiatrist or endocrinologist.  She has appt tomorrow with endocrinologist.    Pt had eye surgery last week.  Her vision prognosis is poor.   Relevant past medical, surgical, family and social history reviewed and updated as indicated. Interim medical history since our last visit reviewed. Allergies and medications reviewed and updated.   Current Outpatient Medications:  .  albuterol (PROVENTIL HFA;VENTOLIN HFA) 108 (90 Base) MCG/ACT inhaler, Inhale 2 puffs into the lungs every 6 (six) hours as needed for wheezing or shortness of breath., Disp: , Rfl:  .  gabapentin (NEURONTIN) 300 MG capsule, Take 1 capsule (300 mg total) by mouth 2 (two) times daily., Disp: 180 capsule, Rfl: 0 .  insulin aspart (NOVOLOG) 100 UNIT/ML injection, Inject 15 Units into the skin 3 (three) times daily before meals. 15 units plus sliding scales., Disp: , Rfl:  .  insulin detemir (LEVEMIR) 100 UNIT/ML injection, Inject 58 Units at bedtime into the skin. Reported on 04/16/2016, Disp: , Rfl:  .  lovastatin (MEVACOR) 20 MG tablet, Take 1 tablet (20 mg total) by mouth at bedtime., Disp: 30 tablet, Rfl: 3 .  metFORMIN (GLUCOPHAGE) 500 MG tablet, Take 1 tablet (500 mg total) by mouth 2 (two) times daily with a meal., Disp: 180 tablet, Rfl: 1 .  PRESCRIPTION MEDICATION, Place 1 drop into the right eye 2 (two) times daily. Eye drop  for glaucoma she received here at the hospital, Disp: , Rfl:  .  sitaGLIPtin (JANUVIA) 100 MG tablet, Take 1 tablet (100 mg total) by mouth daily., Disp: 90 tablet, Rfl: 2   Review of Systems  Per HPI unless specifically indicated above     Objective:    BP 132/80 (BP Location: Left Arm, Patient Position: Sitting, Cuff Size: Normal)   Pulse 65   Temp (!) 97.3 F (36.3 C)   Wt (!) 343 lb 12 oz (155.9 kg)   SpO2 99%   BMI 55.48 kg/m   Wt Readings from Last 3 Encounters:  10/13/17 (!) 343 lb 12 oz (155.9 kg)  09/10/17 290 lb (131.5 kg)  09/08/17 (!) 347 lb 8 oz (157.6 kg)    Physical Exam  Constitutional: Vital signs are normal. She is cooperative.  Morbidly obese  HENT:  Head: Normocephalic and atraumatic.  Neck: Neck supple.  Cardiovascular: Normal rate and regular rhythm.  Pulmonary/Chest: Effort normal and breath sounds normal. No respiratory distress. She has no wheezes. She has no rales.  Abdominal: Soft. Bowel sounds are normal. There is no tenderness.  Musculoskeletal:  Feet without open wound.  + pulses. No signs infection.  Neurological: She is alert.  Skin: Skin is warm and dry.  Psychiatric: Her behavior is normal. Judgment normal. She exhibits a depressed mood.  Nursing note and vitals reviewed.  Assessment & Plan:   Encounter Diagnoses  Name Primary?  Marland Kitchen. Uncontrolled type 2 diabetes mellitus with complication, with long-term current use of insulin (HCC) Yes  . Personal history of noncompliance with medical treatment, presenting hazards to health   . Diabetic retinopathy associated with type 2 diabetes mellitus, macular edema presence unspecified, unspecified laterality, unspecified retinopathy severity (HCC)   . Diabetic polyneuropathy associated with type 2 diabetes mellitus (HCC)   . S/P amputation   . Morbid obesity (HCC)     -Check a1c and bmp this afternoon -pt to Go to appt with dr Nida/endocrinologist tomorrow -pt to follow-up here in 2  months.  RTO sooner prn

## 2017-10-14 ENCOUNTER — Ambulatory Visit: Payer: No Typology Code available for payment source | Admitting: "Endocrinology

## 2017-10-20 IMAGING — DX DG KNEE COMPLETE 4+V*L*
4 series · 4 of 4 positions shown · non-contrast
Comparison: 07/17/2009

CLINICAL DATA: Left knee pain.  Fall down steps today.

EXAM:
LEFT KNEE - COMPLETE 4+ VIEW

[knee ap]
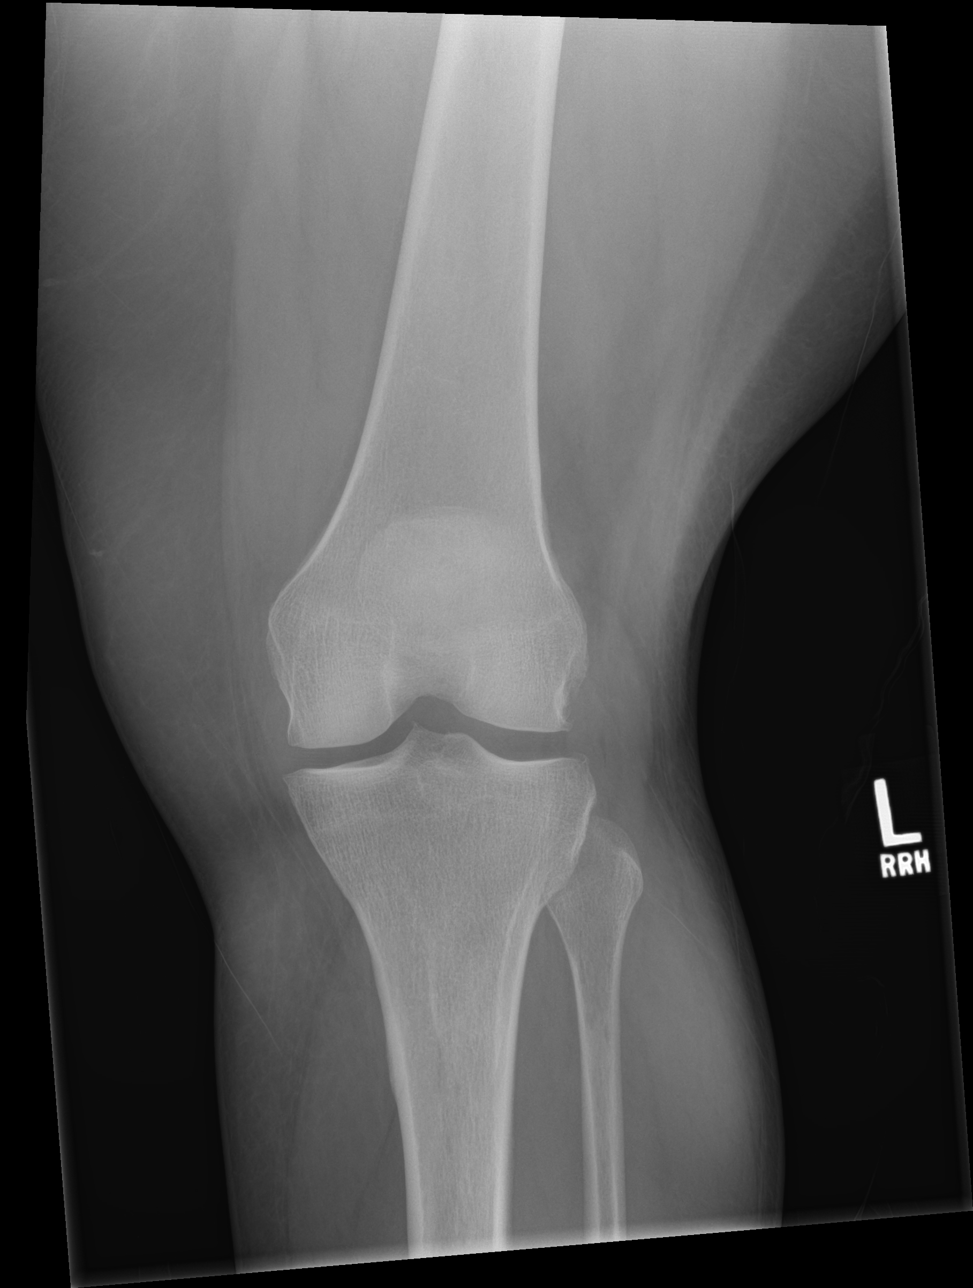

[knee obl (1 of 2)]
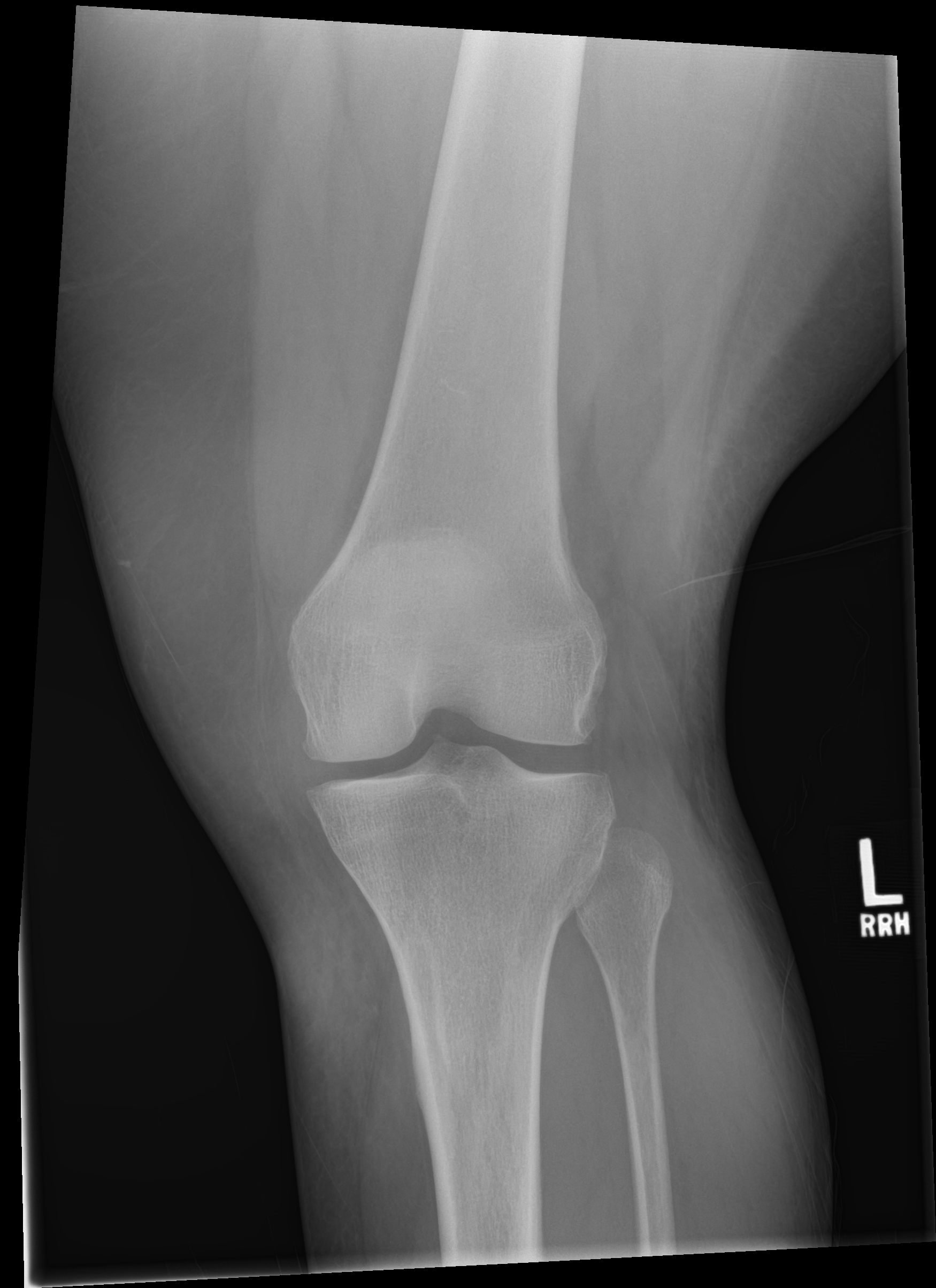

[knee obl (2 of 2)]
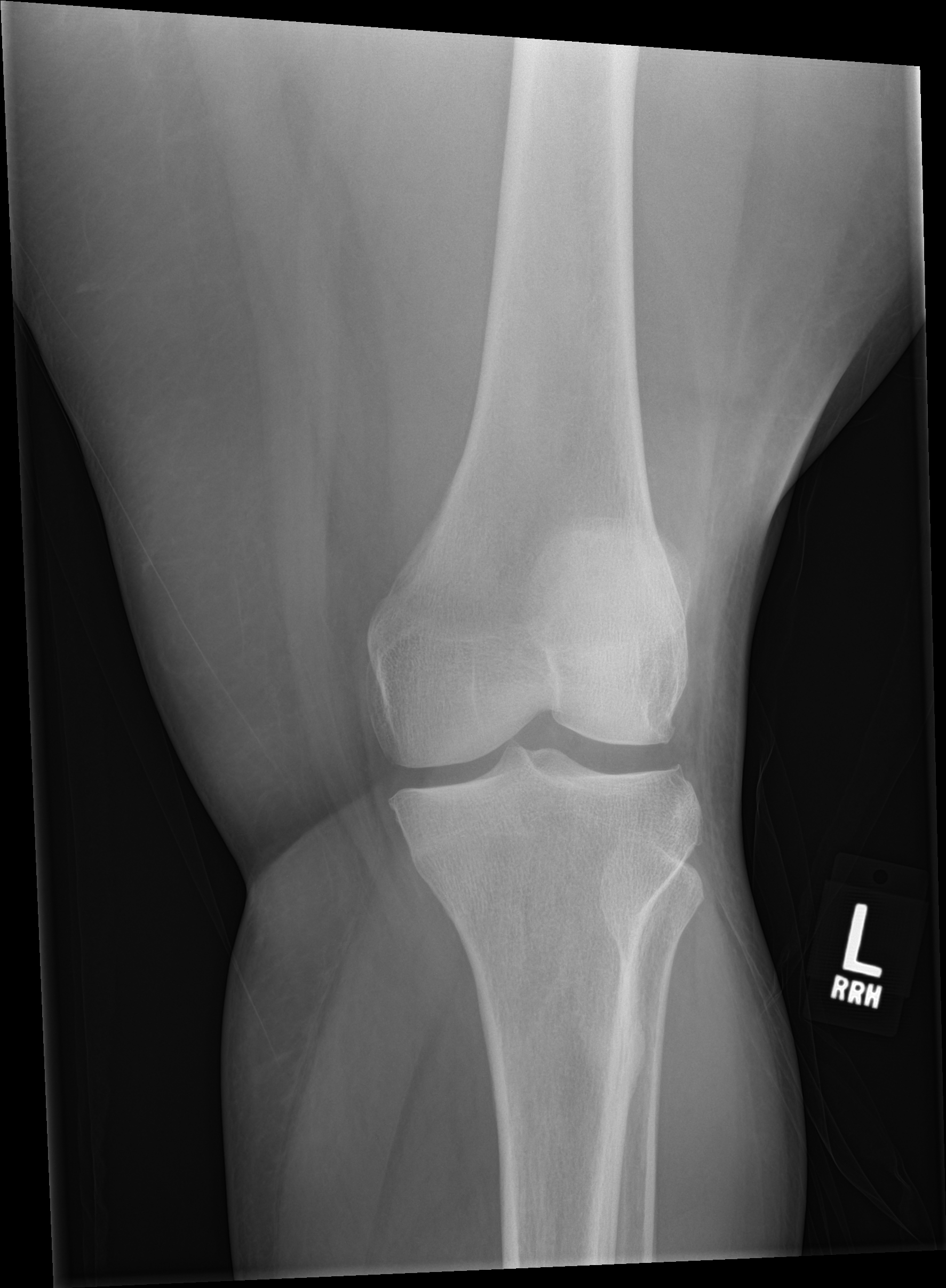

[knee lat]
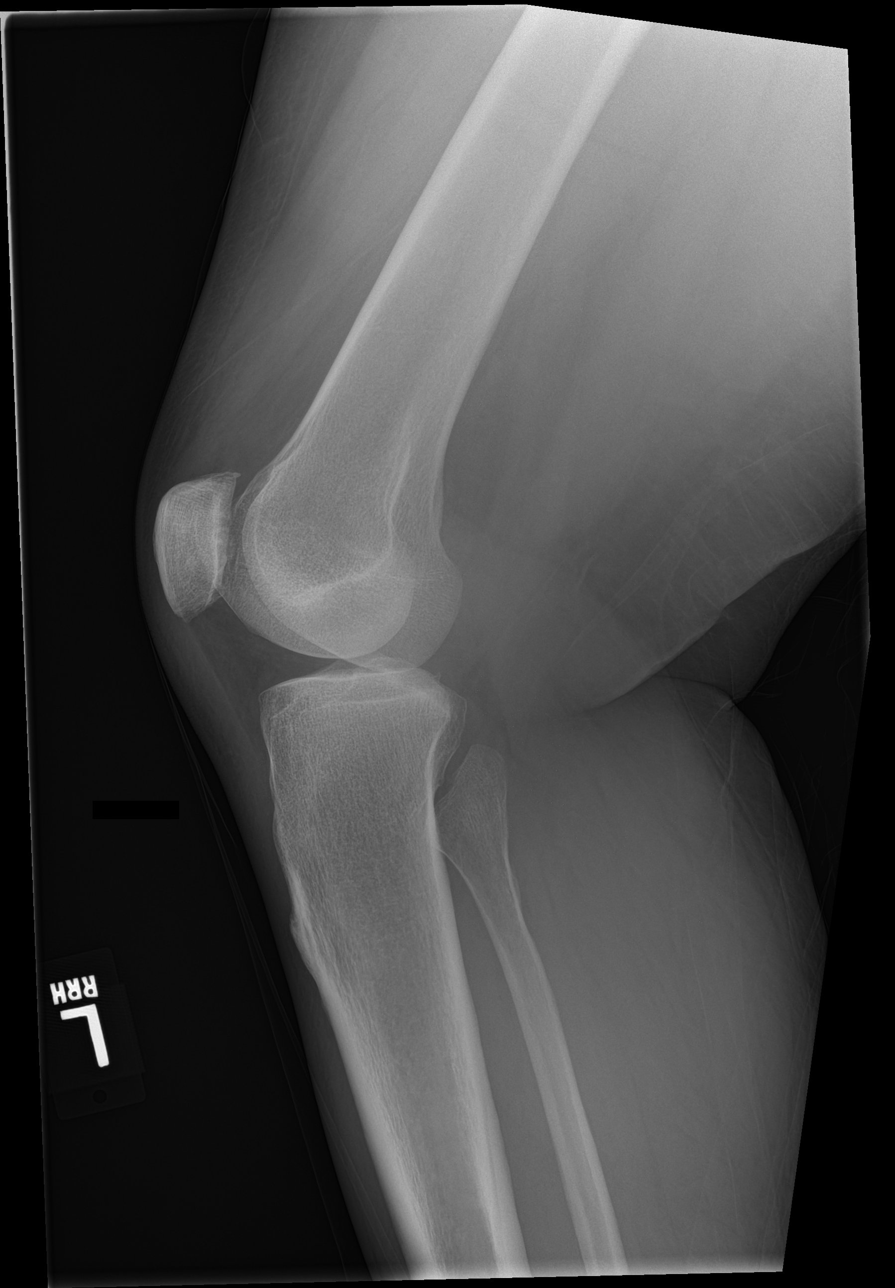

[4 of 4 positions shown; findings below may reference images not displayed]

FINDINGS: Early spurring in the patellofemoral compartment. No acute bony
abnormality. Specifically, no fracture, subluxation, or dislocation.
Soft tissues are intact. No joint effusion.
IMPRESSION: No acute bony abnormality.

## 2017-11-04 ENCOUNTER — Other Ambulatory Visit: Payer: Self-pay

## 2017-11-04 ENCOUNTER — Inpatient Hospital Stay
Admission: AD | Admit: 2017-11-04 | Discharge: 2017-11-13 | DRG: 885 | Disposition: A | Discharge status: Home / Self Care | Payer: No Typology Code available for payment source | Source: Intra-hospital | Attending: Psychiatry | Admitting: Psychiatry

## 2017-11-04 ENCOUNTER — Encounter: Payer: Self-pay | Admitting: Psychiatry

## 2017-11-04 ENCOUNTER — Encounter (HOSPITAL_COMMUNITY): Payer: Self-pay | Admitting: Emergency Medicine

## 2017-11-04 ENCOUNTER — Emergency Department (HOSPITAL_COMMUNITY)
Admission: EM | Admit: 2017-11-04 | Discharge: 2017-11-04 | Disposition: A | Discharge status: Other Acute Inpatient Hospital | Payer: Medicaid Other | Attending: Emergency Medicine | Admitting: Emergency Medicine

## 2017-11-04 DIAGNOSIS — G4700 Insomnia, unspecified: Secondary | ICD-10-CM | POA: Diagnosis present

## 2017-11-04 DIAGNOSIS — Z91013 Allergy to seafood: Secondary | ICD-10-CM | POA: Diagnosis not present

## 2017-11-04 DIAGNOSIS — E785 Hyperlipidemia, unspecified: Secondary | ICD-10-CM | POA: Diagnosis present

## 2017-11-04 DIAGNOSIS — H3530 Unspecified macular degeneration: Secondary | ICD-10-CM | POA: Diagnosis present

## 2017-11-04 DIAGNOSIS — Z9114 Patient's other noncompliance with medication regimen: Secondary | ICD-10-CM

## 2017-11-04 DIAGNOSIS — F22 Delusional disorders: Secondary | ICD-10-CM | POA: Diagnosis present

## 2017-11-04 DIAGNOSIS — E113523 Type 2 diabetes mellitus with proliferative diabetic retinopathy with traction retinal detachment involving the macula, bilateral: Secondary | ICD-10-CM | POA: Diagnosis present

## 2017-11-04 DIAGNOSIS — Z79899 Other long term (current) drug therapy: Secondary | ICD-10-CM | POA: Diagnosis not present

## 2017-11-04 DIAGNOSIS — Z885 Allergy status to narcotic agent status: Secondary | ICD-10-CM | POA: Diagnosis not present

## 2017-11-04 DIAGNOSIS — F419 Anxiety disorder, unspecified: Secondary | ICD-10-CM | POA: Diagnosis present

## 2017-11-04 DIAGNOSIS — G8929 Other chronic pain: Secondary | ICD-10-CM | POA: Diagnosis present

## 2017-11-04 DIAGNOSIS — Z89411 Acquired absence of right great toe: Secondary | ICD-10-CM | POA: Diagnosis not present

## 2017-11-04 DIAGNOSIS — H01006 Unspecified blepharitis left eye, unspecified eyelid: Secondary | ICD-10-CM | POA: Diagnosis present

## 2017-11-04 DIAGNOSIS — Z87891 Personal history of nicotine dependence: Secondary | ICD-10-CM | POA: Diagnosis not present

## 2017-11-04 DIAGNOSIS — E113599 Type 2 diabetes mellitus with proliferative diabetic retinopathy without macular edema, unspecified eye: Secondary | ICD-10-CM | POA: Diagnosis present

## 2017-11-04 DIAGNOSIS — R45851 Suicidal ideations: Secondary | ICD-10-CM

## 2017-11-04 DIAGNOSIS — Z833 Family history of diabetes mellitus: Secondary | ICD-10-CM

## 2017-11-04 DIAGNOSIS — Z794 Long term (current) use of insulin: Secondary | ICD-10-CM | POA: Diagnosis not present

## 2017-11-04 DIAGNOSIS — F322 Major depressive disorder, single episode, severe without psychotic features: Secondary | ICD-10-CM | POA: Diagnosis present

## 2017-11-04 DIAGNOSIS — Z6841 Body Mass Index (BMI) 40.0 and over, adult: Secondary | ICD-10-CM | POA: Diagnosis not present

## 2017-11-04 DIAGNOSIS — F329 Major depressive disorder, single episode, unspecified: Secondary | ICD-10-CM | POA: Insufficient documentation

## 2017-11-04 DIAGNOSIS — R45850 Homicidal ideations: Secondary | ICD-10-CM | POA: Diagnosis present

## 2017-11-04 DIAGNOSIS — E11319 Type 2 diabetes mellitus with unspecified diabetic retinopathy without macular edema: Secondary | ICD-10-CM | POA: Diagnosis present

## 2017-11-04 DIAGNOSIS — Z046 Encounter for general psychiatric examination, requested by authority: Secondary | ICD-10-CM | POA: Insufficient documentation

## 2017-11-04 DIAGNOSIS — R197 Diarrhea, unspecified: Secondary | ICD-10-CM | POA: Diagnosis present

## 2017-11-04 DIAGNOSIS — E1165 Type 2 diabetes mellitus with hyperglycemia: Secondary | ICD-10-CM

## 2017-11-04 DIAGNOSIS — E1142 Type 2 diabetes mellitus with diabetic polyneuropathy: Secondary | ICD-10-CM | POA: Diagnosis present

## 2017-11-04 DIAGNOSIS — E119 Type 2 diabetes mellitus without complications: Secondary | ICD-10-CM | POA: Insufficient documentation

## 2017-11-04 HISTORY — DX: Suicidal ideations: R45.851

## 2017-11-04 HISTORY — DX: Unspecified visual loss: H54.7

## 2017-11-04 HISTORY — DX: Unspecified cataract: H26.9

## 2017-11-04 LAB — RAPID URINE DRUG SCREEN, HOSP PERFORMED
Amphetamines: NOT DETECTED
BARBITURATES: NOT DETECTED
Benzodiazepines: NOT DETECTED
COCAINE: NOT DETECTED
Opiates: NOT DETECTED
Tetrahydrocannabinol: NOT DETECTED

## 2017-11-04 LAB — CBC WITH DIFFERENTIAL/PLATELET
BASOS PCT: 0 %
Basophils Absolute: 0 10*3/uL (ref 0.0–0.1)
EOS ABS: 0.1 10*3/uL (ref 0.0–0.7)
EOS PCT: 1 %
HCT: 40.5 % (ref 36.0–46.0)
Hemoglobin: 12.7 g/dL (ref 12.0–15.0)
LYMPHS ABS: 4.2 10*3/uL — AB (ref 0.7–4.0)
Lymphocytes Relative: 58 %
MCH: 25.3 pg — AB (ref 26.0–34.0)
MCHC: 31.4 g/dL (ref 30.0–36.0)
MCV: 80.8 fL (ref 78.0–100.0)
Monocytes Absolute: 0.4 10*3/uL (ref 0.1–1.0)
Monocytes Relative: 5 %
Neutro Abs: 2.6 10*3/uL (ref 1.7–7.7)
Neutrophils Relative %: 36 %
Platelets: 216 10*3/uL (ref 150–400)
RBC: 5.01 MIL/uL (ref 3.87–5.11)
RDW: 14.9 % (ref 11.5–15.5)
WBC: 7.2 10*3/uL (ref 4.0–10.5)

## 2017-11-04 LAB — BASIC METABOLIC PANEL
ANION GAP: 5 (ref 5–15)
BUN: 12 mg/dL (ref 6–20)
CALCIUM: 8.9 mg/dL (ref 8.9–10.3)
CO2: 24 mmol/L (ref 22–32)
Chloride: 108 mmol/L (ref 101–111)
Creatinine, Ser: 0.76 mg/dL (ref 0.44–1.00)
GFR calc Af Amer: >60 mL/min (ref >60)
GFR calc non Af Amer: >60 mL/min (ref >60)
GLUCOSE: 139 mg/dL — AB (ref 65–99)
POTASSIUM: 4.1 mmol/L (ref 3.5–5.1)
Sodium: 137 mmol/L (ref 135–145)

## 2017-11-04 LAB — CBG MONITORING, ED: Glucose-Capillary: 136 mg/dL — ABNORMAL HIGH (ref 65–99)

## 2017-11-04 LAB — GLUCOSE, CAPILLARY: GLUCOSE-CAPILLARY: 191 mg/dL — AB (ref 65–99)

## 2017-11-04 LAB — PREGNANCY, URINE: Preg Test, Ur: NEGATIVE

## 2017-11-04 LAB — ETHANOL: Alcohol, Ethyl (B): <10 mg/dL (ref <10)

## 2017-11-04 MED ORDER — PRAVASTATIN SODIUM 20 MG PO TABS
20.0000 mg | ORAL_TABLET | Freq: Every day | ORAL | Status: DC
  Administered 2017-11-05 – 2017-11-12 (×8): 20 mg via ORAL
  Filled 2017-11-04 (×9): qty 1

## 2017-11-04 MED ORDER — INSULIN DETEMIR 100 UNIT/ML ~~LOC~~ SOLN
50.0000 [IU] | Freq: Every day | SUBCUTANEOUS | Status: DC
  Administered 2017-11-04 – 2017-11-10 (×5): 50 [IU] via SUBCUTANEOUS
  Filled 2017-11-04 (×10): qty 0.5

## 2017-11-04 MED ORDER — INSULIN ASPART 100 UNIT/ML ~~LOC~~ SOLN
0.0000 [IU] | Freq: Three times a day (TID) | SUBCUTANEOUS | Status: DC
  Administered 2017-11-05: 3 [IU] via SUBCUTANEOUS
  Administered 2017-11-06 – 2017-11-08 (×6): 2 [IU] via SUBCUTANEOUS
  Administered 2017-11-09: 3 [IU] via SUBCUTANEOUS
  Administered 2017-11-09 – 2017-11-10 (×2): 2 [IU] via SUBCUTANEOUS
  Administered 2017-11-11: 3 [IU] via SUBCUTANEOUS
  Administered 2017-11-12: 2 [IU] via SUBCUTANEOUS
  Administered 2017-11-13: 3 [IU] via SUBCUTANEOUS
  Filled 2017-11-04 (×4): qty 1

## 2017-11-04 MED ORDER — MAGNESIUM HYDROXIDE 400 MG/5ML PO SUSP: 30.0000 mL | Freq: Every day | ORAL | Status: DC | PRN

## 2017-11-04 MED ORDER — INSULIN ASPART 100 UNIT/ML ~~LOC~~ SOLN
12.0000 [IU] | Freq: Three times a day (TID) | SUBCUTANEOUS | Status: DC
  Administered 2017-11-05 – 2017-11-12 (×17): 12 [IU] via SUBCUTANEOUS
  Filled 2017-11-04 (×4): qty 1

## 2017-11-04 MED ORDER — TRAZODONE HCL 100 MG PO TABS
100.0000 mg | ORAL_TABLET | Freq: Every day | ORAL | Status: DC
  Administered 2017-11-04: 100 mg via ORAL
  Filled 2017-11-04: qty 1

## 2017-11-04 MED ORDER — METFORMIN HCL 500 MG PO TABS
500.0000 mg | ORAL_TABLET | Freq: Two times a day (BID) | ORAL | Status: DC
  Administered 2017-11-05 – 2017-11-13 (×16): 500 mg via ORAL
  Filled 2017-11-04 (×17): qty 1

## 2017-11-04 MED ORDER — INSULIN ASPART 100 UNIT/ML ~~LOC~~ SOLN: 0.0000 [IU] | Freq: Every day | SUBCUTANEOUS | Status: DC

## 2017-11-04 MED ORDER — ALUM & MAG HYDROXIDE-SIMETH 200-200-20 MG/5ML PO SUSP
30.0000 mL | ORAL | Status: DC | PRN
  Administered 2017-11-12 (×2): 30 mL via ORAL
  Filled 2017-11-04 (×2): qty 30

## 2017-11-04 MED ORDER — GABAPENTIN 300 MG PO CAPS
300.0000 mg | ORAL_CAPSULE | Freq: Three times a day (TID) | ORAL | Status: DC
  Administered 2017-11-05 (×2): 300 mg via ORAL
  Filled 2017-11-04 (×2): qty 1
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
## 2017-11-04 NOTE — Progress Notes (Signed)
Skin assessment completed by Charlton AmorShatera and Agamjot Kilgallon. She has scarring on her back, per patient from a previous rash. She is on her cycle and was taken to her room for a shower and given clean panties and a sanitary pad.
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
## 2017-11-04 NOTE — ED Notes (Signed)
Pt given meal

## 2017-11-04 NOTE — ED Provider Notes (Signed)
[facility] EMERGENCY DEPARTMENT Provider Note   CSN: [id number] Arrival date & time: 11/04/17  [id number]     History   Chief Complaint Chief Complaint  Patient presents with  . V70.1    HPI Rachel George is a 36 y.o. female with numerous medical complications secondary to diabetes including macular degeneration with quickly progressing loss of vision along with chronic pain secondary to Charcot foot presenting with depression and suicidal ideation.  She expresses frustration with her medical conditions and has become increasingly depressed with thoughts of harming herself by overdosing.  She denies taking any excessive medications prior to arrival.  She believes she is harmful to herself, has been very tearful and depressed.  She has a history of being on antidepressants but not in the past year.  In the past she has been referred to day [facility] but does not want to follow-up with this group, has [facility] but so far has been unsuccessful.  The history is provided by the patient.    Past Medical History:  Diagnosis Date  . Cataract   . Diabetes mellitus without complication (HCC)    dx age 36  . Fatty liver disease, nonalcoholic   . Macular degeneration   . Partial blindness     Patient Active Problem List   Diagnosis Date Noted  . 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 Uncontrolled type 2 diabetes mellitus with hyperglycemia, with long-term current use of insulin (HCC) 05/01/2017  . Obesity, Class III, BMI 40-49.9 (morbid obesity) (HCC) 05/01/2017  . Morbid obesity with BMI of 50.0-59.9, adult (HCC) 05/01/2017  . Diabetic retinopathy associated with type 2 diabetes mellitus (HCC) 03/18/2017  . 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  . Osteomyelitis (HCC) 12/13/2016  . Diabetic foot ulcer (HCC) 10/15/2016  . Cellulitis of toe of right foot 10/07/2016  . Uncontrolled type 2 diabetes mellitus with complication (HCC) 09/09/2016  . Morbid obesity (HCC)  09/09/2016  . Diabetic polyneuropathy associated with type 2 diabetes mellitus (HCC) 09/09/2016    Past Surgical History:  Procedure Laterality Date  . AMPUTATION TOE Right 12/16/2016   Procedure: RIGHT FIRST TOE AMPUTATION;  Surgeon: Ancil LinseyJason Evan Davis, MD;  Location: [facility];  Service: General;  Laterality: Right;  Right first toe amputation for osteomyelitis  . eye lid transplant    . RETINAL LASER PROCEDURE      OB History    Gravida Para Term Preterm AB Living   0 0 0 0 0 0   SAB TAB Ectopic Multiple Live Births   0 0 0 0 0       Home Medications    Prior to Admission medications   Medication Sig Start Date End Date Taking? Authorizing Provider  albuterol (PROVENTIL HFA;VENTOLIN HFA) 108 (90 Base) MCG/ACT inhaler Inhale 2 puffs into the lungs every 6 (six) hours as needed for wheezing or shortness of breath.    [provider]  gabapentin (NEURONTIN) 300 MG capsule Take 1 capsule (300 mg total) by mouth 2 (two) times daily. 03/18/17   Jacquelin HawkingMcElroy, Shannon, PA-C  insulin aspart (NOVOLOG) 100 UNIT/ML injection Inject 15 Units into the skin 3 (three) times daily before meals. 15 units plus sliding scales.    [provider]  insulin detemir (LEVEMIR) 100 UNIT/ML injection Inject 58 Units at bedtime into the skin. Reported on 04/16/2016    [provider]  lovastatin (MEVACOR) 20 MG tablet Take 1 tablet (20 mg total) by mouth at bedtime. 04/24/17  Jacquelin Hawking, PA-C  metFORMIN (GLUCOPHAGE) 500 MG tablet Take 1 tablet (500 mg total) by mouth 2 (two) times daily with a meal. 02/06/17   Jacquelin Hawking, PA-C  PRESCRIPTION MEDICATION Place 1 drop into the right eye 2 (two) times daily. Eye drop for glaucoma she received here at the hospital    [provider]  sitaGLIPtin (JANUVIA) 100 MG tablet Take 1 tablet (100 mg total) by mouth daily. 01/01/17   Jacquelin Hawking, PA-C    Family History Family History  Problem Relation Age of Onset  . Diabetes Father      Social History Social History   Tobacco Use  . Smoking status: Former Smoker    Packs/day: 0.25    Types: Cigarettes    Last attempt to quit: 12/11/2016    Years since quitting: 0.8  . Smokeless tobacco: Never Used  Substance Use Topics  . Alcohol use: No  . Drug use: No     Allergies   Shellfish allergy and Percocet [oxycodone-acetaminophen]   Review of Systems Review of Systems  Constitutional: Negative for fever.  HENT: Negative for congestion and sore throat.   Eyes: Positive for visual disturbance.  Respiratory: Negative for chest tightness and shortness of breath.   Cardiovascular: Negative for chest pain.  Gastrointestinal: Negative for abdominal pain and nausea.  Genitourinary: Negative.   Musculoskeletal: Positive for arthralgias. Negative for joint swelling and neck pain.  Skin: Negative.  Negative for rash and wound.  Neurological: Negative for dizziness, weakness, light-headedness, numbness and headaches.  Psychiatric/Behavioral: Positive for dysphoric mood and suicidal ideas.     Physical Exam Updated Vital Signs BP 131/67 (BP Location: Right Arm)   Pulse 86   Temp 98.6 F (37 C) (Oral)   Resp 18   Ht 5\' 7"  (1.702 m)   Wt (!) 154.2 kg (340 lb)   LMP 11/03/2017   SpO2 98%   BMI 53.25 kg/m   Physical Exam  Constitutional: She is oriented to person, place, and time. She appears well-developed and well-nourished.  HENT:  Head: Normocephalic and atraumatic.  Neck: Normal range of motion.  Cardiovascular: Normal rate, regular rhythm and intact distal pulses.  Pulmonary/Chest: Effort normal and breath sounds normal.  Musculoskeletal: Normal range of motion.  Neurological: She is alert and oriented to person, place, and time.  Skin: Skin is warm and dry.  Psychiatric: Her speech is normal. She exhibits a depressed mood. She expresses suicidal ideation. She expresses suicidal plans.  Nursing note and vitals reviewed.    ED Treatments /  Results  Labs (all labs ordered are listed, but only abnormal results are displayed) Labs Reviewed  CBC WITH DIFFERENTIAL/PLATELET - Abnormal; Notable for the following components:      Result Value   MCH 25.3 (*)    Lymphs Abs 4.2 (*)    All other components within normal limits  BASIC METABOLIC PANEL - Abnormal; Notable for the following components:   Glucose, Bld 139 (*)    All other components within normal limits  ETHANOL  RAPID URINE DRUG SCREEN, HOSP PERFORMED  PREGNANCY, URINE    EKG  EKG Interpretation None       Radiology No results found.  Procedures Procedures (including critical care time)  Medications Ordered in ED Medications - No data to display   Initial Impression / Assessment and Plan / ED Course  I have reviewed the triage vital signs and the nursing notes.  Pertinent labs & imaging results that were available during my  care of the patient were reviewed by me and considered in my medical decision making (see chart for details).     Labs reviewed, patient has been medically screened and TTS consult has been ordered.  Psych holding orders placed  Final Clinical Impressions(s) / ED Diagnoses   Final diagnoses:  Suicidal ideation    ED Discharge Orders    None       Victoriano Laindol, Lorrene Graef, PA-C 11/04/17 1234    Doug SouJacubowitz, Sam, MD 11/04/17 1734    Doug SouJacubowitz, Sam, MD 11/04/17 [phone]

## 2017-11-04 NOTE — ED Notes (Signed)
Pt left with [facility] with all belongings.

## 2017-11-04 NOTE — Progress Notes (Signed)
[facility], attending Dr. Jennet MaduroPucilowska. Report [phone].  [facility] informed [facility] of pt's acceptance. [facility].  Ilean SkillMeghan Carlean Crowl, MSW, LCSW Clinical Social Work 11/04/2017 [phone]

## 2017-11-04 NOTE — BH Assessment (Addendum)
[facility].  Accepting physician is Dr. Jennet MaduroPucilowska  Attending Physician will be Dr. Johnella MoloneyMcNew.  Patient has been assigned to room 319, [facility].  Call report to [phone].  Representative/Transfer Coordinator is Hershey CompanyCamille H. Patient [facility] Access Carlyle Basques(Jenelle)   [facility] Lillia Abed([place] made aware of acceptance.

## 2017-11-05 ENCOUNTER — Other Ambulatory Visit: Payer: Self-pay

## 2017-11-05 LAB — GLUCOSE, CAPILLARY
GLUCOSE-CAPILLARY: 155 mg/dL — AB (ref 65–99)
GLUCOSE-CAPILLARY: 90 mg/dL (ref 65–99)
Glucose-Capillary: 111 mg/dL — ABNORMAL HIGH (ref 65–99)
Glucose-Capillary: 81 mg/dL (ref 65–99)

## 2017-11-05 LAB — LIPID PANEL
Cholesterol: 156 mg/dL (ref 0–200)
HDL: 44 mg/dL (ref >40)
LDL CALC: 88 mg/dL (ref 0–99)
Total CHOL/HDL Ratio: 3.5 RATIO
Triglycerides: 121 mg/dL (ref <150)
VLDL: 24 mg/dL (ref 0–40)

## 2017-11-05 LAB — HEMOGLOBIN A1C
Hgb A1c MFr Bld: 8.5 % — ABNORMAL HIGH (ref 4.8–5.6)
Mean Plasma Glucose: 197.25 mg/dL

## 2017-11-05 LAB — TSH: TSH: 0.863 u[IU]/mL (ref 0.350–4.500)

## 2017-11-05 MED ORDER — DORZOLAMIDE HCL 2 % OP SOLN
1.0000 [drp] | Freq: Three times a day (TID) | OPHTHALMIC | Status: DC
  Administered 2017-11-05 – 2017-11-13 (×23): 1 [drp] via OPHTHALMIC
  Filled 2017-11-05: qty 10

## 2017-11-05 MED ORDER — PANTOPRAZOLE SODIUM 40 MG PO TBEC
40.0000 mg | DELAYED_RELEASE_TABLET | Freq: Once | ORAL | Status: AC
  Administered 2017-11-05: 40 mg via ORAL
  Filled 2017-11-05: qty 1

## 2017-11-05 MED ORDER — PREDNISOLONE ACETATE 1 % OP SUSP
1.0000 [drp] | Freq: Four times a day (QID) | OPHTHALMIC | Status: DC
  Administered 2017-11-05: 1 [drp] via OPHTHALMIC
  Filled 2017-11-05: qty 1

## 2017-11-05 MED ORDER — BACITRACIN-POLYMYXIN B 500-10000 UNIT/GM OP OINT
1.0000 "application " | TOPICAL_OINTMENT | Freq: Four times a day (QID) | OPHTHALMIC | Status: DC
  Administered 2017-11-06 – 2017-11-12 (×17): 1 via OPHTHALMIC
  Filled 2017-11-05: qty 3.5

## 2017-11-05 MED ORDER — GABAPENTIN 600 MG PO TABS
600.0000 mg | ORAL_TABLET | Freq: Two times a day (BID) | ORAL | Status: DC
  Administered 2017-11-05: 600 mg via ORAL
  Filled 2017-11-05 (×2): qty 1

## 2017-11-05 MED ORDER — LOPERAMIDE HCL 2 MG PO CAPS
2.0000 mg | ORAL_CAPSULE | ORAL | Status: DC | PRN
  Administered 2017-11-05: 2 mg via ORAL
  Filled 2017-11-05: qty 1

## 2017-11-05 MED ORDER — ESCITALOPRAM OXALATE 10 MG PO TABS
10.0000 mg | ORAL_TABLET | Freq: Every day | ORAL | Status: DC
  Administered 2017-11-05 – 2017-11-13 (×9): 10 mg via ORAL
  Filled 2017-11-05 (×9): qty 1

## 2017-11-05 MED ORDER — MOXIFLOXACIN HCL 0.5 % OP SOLN
1.0000 [drp] | Freq: Four times a day (QID) | OPHTHALMIC | Status: DC
  Administered 2017-11-05: 1 [drp] via OPHTHALMIC
  Filled 2017-11-05: qty 3

## 2017-11-05 MED ORDER — TRAZODONE HCL 100 MG PO TABS
100.0000 mg | ORAL_TABLET | Freq: Every evening | ORAL | Status: DC | PRN
  Administered 2017-11-06 – 2017-11-12 (×7): 100 mg via ORAL
  Filled 2017-11-05 (×7): qty 1

## 2017-11-05 MED ORDER — BRIMONIDINE TARTRATE 0.2 % OP SOLN
1.0000 [drp] | Freq: Three times a day (TID) | OPHTHALMIC | Status: DC
  Administered 2017-11-05 – 2017-11-13 (×23): 1 [drp] via OPHTHALMIC
  Filled 2017-11-05: qty 5

## 2017-11-05 MED ORDER — IBUPROFEN 200 MG PO TABS
400.0000 mg | ORAL_TABLET | ORAL | Status: DC | PRN
  Administered 2017-11-05 – 2017-11-13 (×10): 400 mg via ORAL
  Filled 2017-11-05 (×12): qty 2

## 2017-11-05 MED ORDER — TIMOLOL MALEATE 0.5 % OP SOLN
1.0000 [drp] | Freq: Two times a day (BID) | OPHTHALMIC | Status: DC
  Administered 2017-11-05 – 2017-11-13 (×16): 1 [drp] via OPHTHALMIC
  Filled 2017-11-05: qty 5

## 2017-11-05 MED ORDER — ATROPINE SULFATE 1 % OP SOLN
1.0000 [drp] | Freq: Three times a day (TID) | OPHTHALMIC | Status: DC
  Administered 2017-11-05 – 2017-11-13 (×23): 1 [drp] via OPHTHALMIC
  Filled 2017-11-05: qty 2

## 2017-11-05 NOTE — BHH Group Notes (Signed)
  11/05/2017  Time: 0930  Type of Therapy/Topic:  Group Therapy:  Emotion Regulation  Participation Level:  Did Not Attend   Description of Group:    The purpose of this group is to assist patients in learning to regulate negative emotions and experience positive emotions. Patients will be guided to discuss ways in which they have been vulnerable to their negative emotions. These vulnerabilities will be juxtaposed with experiences of positive emotions or situations, and patients will be challenged to use positive emotions to combat negative ones. Special emphasis will be placed on coping with negative emotions in conflict situations, and patients will process healthy conflict resolution skills.  Therapeutic Goals: 1. Patient will identify two positive emotions or experiences to reflect on in order to balance out negative emotions 2. Patient will label two or more emotions that they find the most difficult to experience 3. Patient will demonstrate positive conflict resolution skills through discussion and/or role plays  Summary of Patient Progress: Pt was invited to attend group but chose not to attend. CSW will continue to encourage pt to attend group throughout their admission.     Therapeutic Modalities:   Cognitive Behavioral Therapy Feelings Identification Dialectical Behavioral Therapy  Heidi DachKelsey Joene Gelder, MSW, LCSW 11/05/2017 10:14 AM

## 2017-11-05 NOTE — [facility] Suicide Risk Assessment (Signed)
[facility]   Nursing information obtained from:    Demographic factors:   living with husband Current Mental Status:   depressed, stressed, hopeless Loss Factors:   health Historical Factors:   history of depression Risk Reduction Factors:   future oriented  Total Time spent with patient: 1 hour Principal Problem: Major depressive disorder, single episode, severe without psychosis (HCC) Diagnosis:   Patient Active Problem List   Diagnosis Date Noted  . Major depressive disorder, single episode, severe without psychosis (HCC) [F32.2] 11/04/2017  . Suicidal ideation [R45.851] 11/04/2017  . 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 Uncontrolled type 2 diabetes mellitus with hyperglycemia, with long-term current use of insulin (HCC) [E11.65, Z79.4] 05/01/2017  . Obesity, Class III, BMI 40-49.9 (morbid obesity) (HCC) [E66.01] 05/01/2017  . Morbid obesity with BMI of 50.0-59.9, adult (HCC) [E66.01, Z68.43] 05/01/2017  . Diabetic retinopathy associated with type 2 diabetes mellitus (HCC) [E11.319] 03/18/2017  . S/P amputation [Z89.9] 02/06/2017  . Osteomyelitis (HCC) [M86.9] 12/13/2016  . Diabetic foot ulcer (HCC) [W09.811[E11.621, L97.509] 10/15/2016  . Cellulitis of toe of right foot [L03.031] 10/07/2016  . Uncontrolled type 2 diabetes mellitus with complication (HCC) [E11.8, E11.65] 09/09/2016  . Morbid obesity (HCC) [E66.01] 09/09/2016  . Diabetic polyneuropathy associated with type 2 diabetes mellitus (HCC) [E11.42] 09/09/2016   Subjective Data: See H7P  Continued Clinical Symptoms:  Alcohol Use Disorder Identification Test Final Score (AUDIT): 0 The "Alcohol Use Disorders Identification Test", Guidelines for Use in Primary Care, Second Edition.  World Science writerHealth Organization [facility](WHO). Score between 0-7:  no or low risk or alcohol related problems. Score between 8-15:  moderate risk of alcohol related problems. Score between 16-19:  high risk of alcohol  related problems. Score 20 or above:  warrants further diagnostic evaluation for alcohol dependence and treatment.   CLINICAL FACTORS:   Depression:   Aggression Anhedonia Hopelessness   Musculoskeletal: Strength & Muscle Tone: within normal limits Gait & Station: normal Patient leans: N/A  Psychiatric Specialty Exam: Physical Exam  ROS  Blood pressure 136/73, pulse 92, temperature 97.8 F (36.6 C), resp. rate 18, height 5\' 7"  (1.702 m), weight (!) 158.3 kg (349 lb), last menstrual period 11/03/2017.Body mass index is 54.66 kg/m.                                                          COGNITIVE FEATURES THAT CONTRIBUTE TO RISK:  None    SUICIDE RISK:   Moderate:  Frequent suicidal ideation with limited intensity, and duration, some specificity in terms of plans, no associated intent, good self-control, limited dysphoria/symptomatology, some risk factors present, and identifiable protective factors, including available and accessible social support.  PLAN OF CARE: See H&P  I certify that inpatient services furnished can reasonably be expected to improve the patient's condition.   Haskell RilingHolly R Cacey Willow, MD 11/05/2017, 4:08 PM

## 2017-11-05 NOTE — Tx Team (Signed)
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 Complications Medication change or noncompliance   PATIENT STRENGTHS: Ability for insight Average or above average intelligence Capable of independent living Motivation for treatment/growth   PATIENT IDENTIFIED PROBLEMS: Mood Instability  Suicidal Thoughts & Homicidal Thoughts (Husband)  Anger & Agression  Treatment and Medication Nonadherence  DM - II Complications             DISCHARGE CRITERIA:  Ability to meet basic life and health needs Adequate post-discharge living arrangements Improved stabilization in mood, thinking, and/or behavior Motivation to continue treatment in a less acute level of care Reduction of life-threatening or endangering symptoms to within safe limits Verbal commitment to aftercare and medication compliance  PRELIMINARY DISCHARGE PLAN: Outpatient therapy Participate in family therapy Return to previous living arrangement  PATIENT/FAMILY INVOLVEMENT: This treatment plan has been presented to and reviewed with the patient, Rachel BarkerKaren George.  The patient have been given the opportunity to ask questions and make suggestions.  Cleotis NipperAbiodun T Talmage Teaster, RN 11/05/2017, 3:48 AM

## 2017-11-05 NOTE — Progress Notes (Signed)
Recreation Therapy Notes  Date: 12.05.2018  Time: 1:00pm   Location: Craft Room  Behavioral response: Appropriate  Intervention Topic: Communication  Discussion/Intervention: Group content today was focused on communication. The group defined communication and ways to communicate with others. Individuals stated reason why communication is important and some reasons to communicate with others. Patients expressed if they thought they were good at communicating with others and ways they could improve their communication skills. The group identified important parts of communication and some experiences they have had in the past with communication. The group participated in the intervention "Put the story together", where they had a chance to test out their communication skills and identify ways to improve their communication techniques.  Clinical Observations/Feedback:  Patient came to group and identified tone and the way others perceive you as form of communication. She expressed that she communicates with others most of the time by phone. Individual participated in the intervention an was social with her peers and staff during group.  Johnthan Axtman LRT/CTRS         Demarrius Guerrero 11/05/2017 2:00 PM
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
## 2017-11-05 NOTE — Progress Notes (Signed)
Recreation Therapy Notes   Date: 12.05.2018  Time: 3:00pm  Location: Craft room  Behavioral response: None  Group Type: Art/Craft  Participation level: None  Communication: None  Comments: Patient did not attend group.   Bonetta Mostek LRT/CTRS        Sincerity Cedar 11/05/2017 4:26 PM

## 2017-11-05 NOTE — Progress Notes (Signed)
[facility] responded to order requisition to consult with patient for suicidal thoughts. [place] met with patient outside her room. Patient wanted to be in the dark as her eye was causing her pain from her recent surgery. Patient stated her main need was for her eye drops and some tylenol. Patient stated she was not in a good mood due to her eye problems and that once she got her medications she would feel better. The light is causing her pain. CH was going to to check on the status of her medications when a member of the medical staff came to her door to give her an update and to offer her relief. [place] left patient with staff member so they could have privacy.  CH provided emotional support and attentive listening. Patient did not state she was having suicidal thoughts, she was just in pain and angry per her statements.   11/05/17 1045  Clinical Encounter Type  Visited With Patient;Health care provider  Visit Type Initial;Spiritual support;Behavioral Health  Referral From Nurse  Spiritual Encounters  Spiritual Needs Emotional  Stress Factors  Patient Stress Factors Health changes

## 2017-11-05 NOTE — B[facility] Group Notes (Signed)
[facility] Group Notes:  (Nursing/MHT/Case Management/Adjunct)  Date:  11/05/2017  Time:  10:11 PM  Type of Therapy:  Group Therapy  Participation Level:  Did Not Attend  Participation Quality:  Summary of Progress/Problems:  Mayra NeerJackie L Gina Leblond 11/05/2017, 10:11 PM

## 2017-11-05 NOTE — Tx Team (Signed)
Interdisciplinary Treatment and Diagnostic Plan Update  11/05/2017 Time of Session: 11:00am Rachel George MRN: [id number]  Principal Diagnosis: Major depressive disorder, single episode, severe without psychosis (HCC)  Secondary Diagnoses: Principal Problem:   Major depressive disorder, single episode, severe without psychosis (HCC) Active Problems:   Morbid obesity (HCC)   Diabetic polyneuropathy associated with type 2 diabetes mellitus (HCC)   Diabetic retinopathy associated with type 2 diabetes mellitus (HCC)   Uncontrolled type 2 diabetes mellitus with hyperglycemia, with long-term current use of insulin (HCC)   Suicidal ideation   Current Medications:  Current Facility-Administered Medications  Medication Dose Route Frequency Provider Last Rate Last Dose  . alum & mag hydroxide-simeth (MAALOX/MYLANTA) 200-200-20 MG/5ML suspension 30 mL  30 mL Oral Q4H PRN Pucilowska, Jolanta B, MD      . atropine 1 % ophthalmic solution 1 drop  1 drop Right Eye TID Haskell Riling, MD   1 drop at 11/05/17 1232  . bacitracin-polymyxin b (POLYSPORIN) ophthalmic ointment 1 application  1 application Right Eye QID McNew, Holly R, MD      . brimonidine (ALPHAGAN) 0.2 % ophthalmic solution 1 drop  1 drop Right Eye Q8H McNew, Holly R, MD   1 drop at 11/05/17 1231  . dorzolamide (TRUSOPT) 2 % ophthalmic solution 1 drop  1 drop Right Eye TID Haskell Riling, MD   1 drop at 11/05/17 1233  . escitalopram (LEXAPRO) tablet 10 mg  10 mg Oral Daily McNew, Ileene Hutchinson, MD   10 mg at 11/05/17 1230  . gabapentin (NEURONTIN) tablet 600 mg  600 mg Oral BID McNew, Holly R, MD      . ibuprofen (ADVIL,MOTRIN) tablet 400 mg  400 mg Oral Q4H PRN Haskell Riling, MD   400 mg at 11/05/17 1056  . insulin aspart (novoLOG) injection 0-15 Units  0-15 Units Subcutaneous TID WC Pucilowska, Jolanta B, MD   3 Units at 11/05/17 0750  . insulin aspart (novoLOG) injection 0-5 Units  0-5 Units Subcutaneous QHS Pucilowska, Jolanta B, MD       . insulin aspart (novoLOG) injection 12 Units  12 Units Subcutaneous TID WC Pucilowska, Jolanta B, MD   12 Units at 11/05/17 0750  . insulin detemir (LEVEMIR) injection 50 Units  50 Units Subcutaneous QHS Pucilowska, Jolanta B, MD   50 Units at 11/04/17 2236  . magnesium hydroxide (MILK OF MAGNESIA) suspension 30 mL  30 mL Oral Daily PRN Pucilowska, Jolanta B, MD      . metFORMIN (GLUCOPHAGE) tablet 500 mg  500 mg Oral BID WC Pucilowska, Jolanta B, MD   500 mg at 11/05/17 0747  . pravastatin (PRAVACHOL) tablet 20 mg  20 mg Oral q1800 Pucilowska, Jolanta B, MD      . timolol (TIMOPTIC) 0.5 % ophthalmic solution 1 drop  1 drop Right Eye BID McNew, Ileene Hutchinson, MD      . traZODone (DESYREL) tablet 100 mg  100 mg Oral QHS PRN McNew, Ileene Hutchinson, MD       PTA Medications: Medications Prior to Admission  Medication Sig Dispense Refill Last Dose  . acetaminophen (TYLENOL) 500 MG tablet Take 1,000 mg by mouth every 6 (six) hours as needed for moderate pain.   Past Week at Unknown time  . albuterol (PROVENTIL HFA;VENTOLIN HFA) 108 (90 Base) MCG/ACT inhaler Inhale 2 puffs into the lungs every 6 (six) hours as needed for wheezing or shortness of breath.   unknown  . atropine 1 % ophthalmic solution Place 1  drop into the right eye 3 (three) times daily.   11/03/2017 at Unknown time  . bacitracin-polymyxin b (POLYSPORIN) ophthalmic ointment Place 1 application into the right eye 4 (four) times daily.   11/03/2017 at Unknown time  . bismuth subsalicylate (PEPTO BISMOL) 262 MG/15ML suspension Take 30 mLs by mouth every 6 (six) hours as needed for indigestion.   Past Week at Unknown time  . brimonidine (ALPHAGAN) 0.2 % ophthalmic solution Place 1 drop into the right eye every 8 (eight) hours.   11/03/2017 at Unknown time  . dorzolamide (TRUSOPT) 2 % ophthalmic solution Place 1 drop into the right eye 3 (three) times daily.   Past Week at Unknown time  . gabapentin (NEURONTIN) 300 MG capsule Take 1 capsule (300 mg total) by  mouth 2 (two) times daily. 180 capsule 0 11/03/2017 at Unknown time  . gabapentin (NEURONTIN) 300 MG capsule Take 1 capsule by mouth 2 (two) times daily.   11/03/2017 at Unknown time  . insulin aspart (NOVOLOG) 100 UNIT/ML injection Inject 15 Units into the skin 3 (three) times daily before meals. 15 units plus sliding scales.   11/03/2017 at Unknown time  . insulin detemir (LEVEMIR) 100 UNIT/ML injection Inject 58 Units at bedtime into the skin. Reported on 04/16/2016   11/03/2017 at Unknown time  . lovastatin (MEVACOR) 20 MG tablet Take 1 tablet (20 mg total) by mouth at bedtime. 30 tablet 3 11/03/2017 at Unknown time  . lovastatin (MEVACOR) 20 MG tablet Take 1 tablet by mouth at bedtime.   11/03/2017 at Unknown time  . metFORMIN (GLUCOPHAGE) 500 MG tablet Take 1 tablet (500 mg total) by mouth 2 (two) times daily with a meal. 180 tablet 1 11/03/2017 at Unknown time  . moxifloxacin (VIGAMOX) 0.5 % ophthalmic solution Place 1 drop into the right eye 4 (four) times daily.   11/03/2017 at Unknown time  . prednisoLONE acetate (PRED FORTE) 1 % ophthalmic suspension Place 1 drop into the right eye 4 (four) times daily.   11/03/2017 at Unknown time  . sitaGLIPtin (JANUVIA) 100 MG tablet Take 1 tablet (100 mg total) by mouth daily. 90 tablet 2 11/03/2017 at Unknown time  . timolol (BETIMOL) 0.5 % ophthalmic solution Place 1 drop into the right eye 2 (two) times daily.   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   Physician Treatment Plan for Primary Diagnosis: Major depressive disorder, single episode,  severe without psychosis (HCC) Long Term Goal(s): Improvement in symptoms so as ready for discharge Improvement in symptoms so as ready for discharge   Short Term Goals: Ability to identify changes in lifestyle to reduce recurrence of condition will improve Ability to verbalize feelings will improve Ability to identify changes in lifestyle to reduce recurrence of condition will improve Ability to disclose and discuss suicidal ideas Ability to demonstrate self-control will improve Ability to identify and develop effective coping behaviors will improve Compliance with prescribed medications will improve Ability to identify triggers associated with substance abuse/mental health issues will improve  Medication Management: Evaluate patient's response, side effects, and tolerance of medication regimen.  Therapeutic Interventions: 1 to 1 sessions, Unit Group sessions and Medication administration.  Evaluation of Outcomes:  Progressing  Physician Treatment Plan for Secondary Diagnosis: Principal Problem:   Major depressive disorder, single episode, severe without psychosis (HCC) Active Problems:   Morbid obesity (HCC)   Diabetic polyneuropathy associated with type 2 diabetes mellitus (HCC)   Diabetic retinopathy associated with type 2 diabetes mellitus (HCC)   Uncontrolled type 2 diabetes mellitus with hyperglycemia, with long-term current use of insulin (HCC)   Suicidal ideation  Long Term Goal(s): Improvement in symptoms so as ready for discharge Improvement in symptoms so as ready for discharge   Short Term Goals: Ability to identify changes in lifestyle to reduce recurrence of condition will improve Ability to verbalize feelings will improve Ability to identify changes in lifestyle to reduce recurrence of condition will improve Ability to disclose and discuss suicidal ideas Ability to demonstrate self-control will improve Ability to identify and develop effective coping behaviors will  improve Compliance with prescribed medications will improve Ability to identify triggers associated with substance abuse/mental health issues will improve     Medication Management: Evaluate patient's response, side effects, and tolerance of medication regimen.  Therapeutic Interventions: 1 to 1 sessions, Unit Group sessions and Medication administration.  Evaluation of Outcomes: Progressing   RN Treatment Plan for Primary Diagnosis: Major depressive disorder, single episode, severe without psychosis (HCC) Long Term Goal(s): Knowledge of disease and therapeutic regimen to maintain health will improve  Short Term Goals: Ability to remain free from injury will improve, Ability to verbalize frustration and anger appropriately will improve and Ability to identify and develop effective coping behaviors will improve  Medication Management: RN will administer medications as ordered by provider, will assess and evaluate patient's response and provide education to patient for prescribed medication. RN will report any adverse and/or side effects to prescribing provider.  Therapeutic Interventions: 1 on 1 counseling sessions, Psychoeducation, Medication administration, Evaluate responses to treatment, Monitor vital signs and CBGs as ordered, Perform/monitor CIWA, COWS, AIMS and Fall Risk screenings as ordered, Perform wound care treatments as ordered.  Evaluation of Outcomes: Progressing   LCSW Treatment Plan for Primary Diagnosis: Major depressive disorder, single episode, severe without psychosis (HCC) Long Term Goal(s): Safe transition to appropriate next level of care at discharge, Engage patient in therapeutic group addressing interpersonal concerns.  Short Term Goals: Engage patient in aftercare planning with referrals and resources, Increase ability to appropriately verbalize feelings, Identify triggers associated with mental health/substance abuse issues and Increase skills for wellness and  recovery  Therapeutic Interventions: Assess for all discharge needs, 1 to 1 time with Social worker, Explore available resources and support systems, Assess for adequacy in community support network, Educate family and significant other(s) on suicide prevention, Complete Psychosocial Assessment, Interpersonal group therapy.  Evaluation of Outcomes: Progressing   Progress in Treatment: Attending groups: Yes. Participating in groups: Yes. Taking medication as prescribed: Yes. Toleration medication: Yes. Family/Significant other contact made: No, will contact:    Patient understands diagnosis: Yes. Discussing patient identified problems/goals with staff: Yes. Medical problems stabilized or resolved: Yes. Denies suicidal/homicidal ideation: No. Issues/concerns per patient self-inventory: No. Other:    New problem(s) identified: No, Describe:     New Short Term/Long Term Goal(s): better manage emotions and cope with new medical diagnosis.  Discharge Plan or Barriers: Home with husband and follow up with outpatient provider TBD.  Reason for Continuation of Hospitalization: Anxiety Depression Medical Issues Medication stabilization  Estimated Length of Stay:2 days  Recreational Therapy: Patient Stressors: Family, Relationship, Disability  Patient Goal: Patient will identify 3 benefits of self-care x5  days.   Attendees: Patient:Rachel George 11/05/2017 2:25 PM  Physician: Corinna Gab, MD 11/05/2017 2:25 PM  Nursing: Hulan Amato, RN 11/05/2017 2:25 PM  RN Care Manager: 11/05/2017 2:25 PM  Social Worker: Jake Shark, LCSW 11/05/2017 2:25 PM  Recreational Therapist: Garret Reddish, LRT 11/05/2017 2:25 PM  Other:  11/05/2017 2:25 PM  Other:  11/05/2017 2:25 PM  Other: 11/05/2017 2:25 PM    Scribe for Treatment Team: Glennon Mac, LCSW 11/05/2017 2:25 PM

## 2017-11-05 NOTE — Plan of Care (Signed)
Patient is alert and oriented. Patient has passive SI, and HI. Patient's affect is sad , flat and depressed. Patient is concerned about eye drop medication. Patient had surgery on her eye on the November 6th, 2018. Patient is present in the milieu and is appropriate to staff and around peers. Patient is compliant with medications. Nurse will continue to monitor patient. Safety checks will continue Q 15 minutes.

## 2017-11-05 NOTE — Progress Notes (Signed)
Inpatient Diabetes Program Recommendations  AACE/ADA: New Consensus Statement on Inpatient Glycemic Control (2015)  Target Ranges:  Prepandial:   less than 140 mg/dL      Peak postprandial:   less than 180 mg/dL (1-2 hours)      Critically ill patients:  140 - 180 mg/dL   Results for Dayna BarkerROBERTSON, Timesha (MRN [id number]) as of 11/05/2017 10:00  Ref. Range 11/04/2017 17:06 11/04/2017 19:29 11/05/2017 06:54  Glucose-Capillary Latest Ref Range: 65 - 99 mg/dL 409136 (H) 811191 (H) 914155 (H)  Results for Dayna BarkerROBERTSON, Jaydalynn (MRN [id number]) as of 11/05/2017 10:00  Ref. Range 10/13/2017 14:38 11/05/2017 06:43  Hemoglobin A1C Latest Ref Range: 4.8 - 5.6 % 9.3 (H) 8.5 (H)   Review of Glycemic Control  Diabetes history: DM2 Outpatient Diabetes medications: Metformin XR 500 mg BID, Januvia 100 mg daily, Levemir 58 units QHS, Novolog 15 units TID with meals Current orders for Inpatient glycemic control: Levemir 50 units QHS, Novolog 0-15 units TID with meals, Novolog 0-5 units QHs, Novolog 12 units TID with meals, Metformin XR 500 mg BID  Inpatient Diabetes Program Recommendations: HgbA1C: A1C 8.5% on 11/05/17 indicating an average glucose of 197 mg/dl over the past 2-3 months.  NOTE: Spoke with Dr. Johnella MoloneyMcNew regarding patient and any recommendations for glycemic control. Chart reviewed. Agree with current orders for inpatient glycemic control. Will continue to follow daily and make recommendations if needed as more data is collected.  Thanks, Orlando PennerMarie Salam Chesterfield, RN, MSN, CDE Diabetes Coordinator Inpatient Diabetes Program [phone] (Team Pager from 8am to 5pm)

## 2017-11-05 NOTE — Plan of Care (Signed)
Patient slept for Estimated Hours of 7; Precautionary checks every 15 minutes for safety maintained, room free of safety hazards, patient sustains no injury or falls during this shift.  

## 2017-11-05 NOTE — Progress Notes (Signed)
Recreation Therapy Notes  INPATIENT RECREATION THERAPY ASSESSMENT  Patient Details Name: Dayna BarkerKaren Greenhouse MRN: [id number] DOB: 1981/07/28 Today's Date: 11/05/2017  Patient Stressors: Family, Relationship, Other (Comment)(Disability)  Coping Skills:   Isolate, Arguments  Personal Challenges: Communication, Concentration, Expressing Yourself, Problem-Solving, Relationships, Self-Esteem/Confidence, Social Interaction, Stress Management, Time Management, Trusting Others  Leisure Interests (2+):  (None)  Awareness of Community Resources:  Yes  Community Resources:     Current Use: No  If no, Barriers?: Surveyor, quantityinancial, Transportation  Patient Strengths:  Trusting, Humility, understanding, compromise  Patient Identified Areas of Improvement:  Self-awareness/ Self-care, not over extending self  Current Recreation Participation:  None  Patient Goal for Hospitalization:  Able to manage emotions and learn how to manage day to day life.  [place] Residence[place]  County [place]idence[place]   Current [place] (including self-harm):  No  Current HI:  No  Consent to Intern Participation: N/A   Coleston Dirosa 11/05/2017, 2:29 PM

## 2017-11-05 NOTE — [facility] Suicide Risk Assessment (Signed)
[facility] INPATIENT:  Family/Significant Other Suicide Prevention Education  Suicide Prevention Education:  Education Completed; Pt's husband, Christena DeemShane Pfahler at [phone], has been identified by the patient as the family member/significant other with whom the patient will be residing, and identified as the person(s) who will aid the patient in the event of a mental health crisis (suicidal ideations/suicide attempt).  With written consent from the patient, the family member/significant other has been provided the following suicide prevention education, prior to the and/or following the discharge of the patient.  The suicide prevention education provided includes the following:  Suicide risk factors  Suicide prevention and interventions  National Suicide Hotline telephone number  [facility] assessment telephone number  [facility] Emergency Assistance 911  [facility] and/or [facility] telephone number  Request made of family/significant other to:  Remove weapons (e.g., guns, rifles, knives), all items previously/currently identified as safety concern.    Remove drugs/medications (over-the-counter, prescriptions, illicit drugs), all items previously/currently identified as a safety concern.  The family member/significant other verbalizes understanding of the suicide prevention education information provided.  The family member/significant other agrees to remove the items of safety concern listed above.  Pt's husband stated, "I just talked to her. She sounds better than what she did before she left. To me, she's going through some stuff in her own head. I don't know what sparked all of this." Pt's husband confirmed that pt does not have access to weapons or other means of self harm, and that pt is able to return home upon discharge. Pt's husband also stated that he would be able to transport pt home upon discharge.   Heidi DachKelsey Jolee Critcher, LCSW 11/05/2017,  1:11 PM
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
## 2017-11-05 NOTE — H&P (Addendum)
Psychiatric Admission Assessment Adult  Patient Identification: Rachel George MRN:  [id number] Date of Evaluation:  11/05/2017 Chief Complaint:   Major Depression Order Principal Diagnosis: Major depressive disorder, single episode, severe without psychosis (HCC) Diagnosis:   Patient Active Problem List   Diagnosis Date Noted  . Major depressive disorder, single episode, severe without psychosis (HCC) [F32.2] 11/04/2017  . Suicidal ideation [R45.851] 11/04/2017  . 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 Uncontrolled type 2 diabetes mellitus with hyperglycemia, with long-term current use of insulin (HCC) [E11.65, Z79.4] 05/01/2017  . Obesity, Class III, BMI 40-49.9 (morbid obesity) (HCC) [E66.01] 05/01/2017  . Morbid obesity with BMI of 50.0-59.9, adult (HCC) [E66.01, Z68.43] 05/01/2017  . Diabetic retinopathy associated with type 2 diabetes mellitus (HCC) [E11.319] 03/18/2017  . S/P amputation [Z89.9] 02/06/2017  . Osteomyelitis (HCC) [M86.9] 12/13/2016  . Diabetic foot ulcer (HCC) [Q65.784, L97.509] 10/15/2016  . Cellulitis of toe of right foot [L03.031] 10/07/2016  . Uncontrolled type 2 diabetes mellitus with complication (HCC) [E11.8, E11.65] 09/09/2016  . Morbid obesity (HCC) [E66.01] 09/09/2016  . Diabetic polyneuropathy associated with type 2 diabetes mellitus (HCC) [E11.42] 09/09/2016   History of Present Illness: 36 yo female presented to ED for SI and HI. She has history of multiple medical issues including uncontrolled diabetes resulting in complications including losing her vision due to macular degeneration. She has long history of medication noncompliance. She has been increasingly depressed and has been having intrusive suicidal thoughts with plan to overdose on gabapentin or overdose on insulin.  Per chart, she is also having thoughts of killing her husband. She currently lives with her husband who has mental health issues as well. She states that he blames  her for not being able to work and finances are another hardship. Pt feels like she is losing her independence due to medical issues. She is angry and frustrated that she did not take better care of herself. She reported that she has been angry and flipped over a table and was tearing up her stuff. She has applied for disability and medicaid and is waiting to hear back.   Upon evaluation today, pt states that she is having a lot of stress and "reached my breaking point." She states that she has a lot of issues with her health and is losing her vision. She states that this has been very hard because she is used to being very independent and taking care of everything in the household. Since she is losing her visiting, she has had to rely more on her husband for things. She states that he reluctantly helps her but he always complains about it and she is scared to ask him a question. She states that he has mental health issues and paranoia and she has to deal with that. She states that she has always put herself on "the backburner" and helped other people and neglected herself. She feels very guilty for this. She states that her husband blames her for not being able to work and also blames her for the fact that he cant get a job. She states that finances are really difficult. She has applied for disability but has not heard back yet. She states that she has been very depressed for the past several months. She has had suicidal thoughts that have been worsening and are more intrusive. She states that she has always had suicidal thoughts but usually able to brush them off and when she was busy working she was able to manage  them more. She states that the thoughts have become more intrusive and not able to ignore them as much .She was having thoughts of overdosing on her medications or insulin. She states that she is always have intrusive thoughts of "cutting my husband's head off." She states that she does not want to do  this and does not think she would ever hurt him but "I was scared of myself in my depressive state.  She states that he frustrates her a lot." She states that she has chronic pain which contributes to stress. She has a lot of worry and anxiety. She reports her sleep is "okay." She has siblings and parents but feels li ke a burden to them and does not like to talk to them about what she is going through. She states that she has been getting more irritable and angry and flipped the table over due to frustration. She used to be on Zoloft 20 years ago but "made me feel numb and not have emotions." She states that she managed well since then and kept very busy with work. She states that she feels very hopeless, worthless and a burden to others.   I spoke with her husband, Vincenza HewsShane today. He states that she kept talking about how she was feeling really sad and "Then she just flipped out." He did not know the extent of how she was feeling. He does not have any safety concerns. I also discussed with him that she was having thoughts of harming him and that it is my duty to warn him that she mentioned this to me. He was in understanding and had no further questions.   Associated Signs/Symptoms: Depression Symptoms:  depressed mood, anhedonia, fatigue, feelings of worthlessness/guilt, difficulty concentrating, hopelessness, suicidal thoughts with specific plan, (Hypo) Manic Symptoms:  Denies history of manic symptoms including decreased need for sleep Anxiety Symptoms:  Excessive Worry, Psychotic Symptoms:  Denies PTSD Symptoms: Negative Total Time spent with patient: 70 minutes  Past Psychiatric History: Pt has no past psychiatric diagnosis. No past inpatient hospitalizations. No past suicide attempts but has had SI in the past. She was on medications in the distant past (20 years ago) for depression. She was on Zoloft but "made me feel numb." She does not see any outpatient psychiatric providers currently.    Is the patient at risk to self? Yes.    Has the patient been a risk to self in the past 6 months? Yes.    Has the patient been a risk to self within the distant past? No.  Is the patient a risk to others? Yes.    Has the patient been a risk to others in the past 6 months? Yes.    Has the patient been a risk to others within the distant past? No.   Alcohol Screening: 1. How often do you have a drink containing alcohol?: Never 2. How many drinks containing alcohol do you have on a typical day when you are drinking?: 1 or 2 3. How often do you have six or more drinks on one occasion?: Never AUDIT-C Score: 0 4. How often during the last year have you found that you were not able to stop drinking once you had started?: Never 5. How often during the last year have you failed to do what was normally expected from you becasue of drinking?: Never 6. How often during the last year have you needed a first drink in the morning to get yourself going after a heavy  drinking session?: Never 7. How often during the last year have you had a feeling of guilt of remorse after drinking?: Never 8. How often during the last year have you been unable to remember what happened the night before because you had been drinking?: Never 9. Have you or someone else been injured as a result of your drinking?: No 10. Has a relative or friend or a doctor or another health worker been concerned about your drinking or suggested you cut down?: No Alcohol Use Disorder Identification Test Final Score (AUDIT): 0 Substance Abuse History in the last 12 months:  No., denies all alcohol and drug use Consequences of Substance Abuse: Negative Previous Psychotropic Medications: Yes , Zoloft Psychological Evaluations: No Past Medical History:  Past Medical History:  Diagnosis Date  . Cataract   . Diabetes mellitus without complication (HCC)    dx age 70  . Fatty liver disease, nonalcoholic   . Macular degeneration   . Partial  blindness     Past Surgical History:  Procedure Laterality Date  . AMPUTATION TOE Right 12/16/2016   Procedure: RIGHT FIRST TOE AMPUTATION;  Surgeon: Ancil Linsey, MD;  Location: [facility];  Service: General;  Laterality: Right;  Right first toe amputation for osteomyelitis  . eye lid transplant    . RETINAL LASER PROCEDURE     Family History:  Family History  Problem Relation Age of Onset  . Diabetes Father    Family Psychiatric  History: Brother "had issues but he had a brain tumor that probably caused it."  Tobacco Screening:   Social History: From [place]. Raised by both parents who are still alive. She has 1 brother and 2 sisters and are close to them. She lives with her husband. They have been married for 5 years. No kids. 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 She has applied for disability. She graduated high school. No college. She states that her primary support is "myself."   Allergies:   Allergies  Allergen Reactions  . Shellfish Allergy Anaphylaxis, Shortness Of Breath and Swelling    Facial Swelling  . Percocet [Oxycodone-Acetaminophen] Itching and Nausea And Vomiting   Lab Results:  Results for orders placed or performed during the hospital encounter of 11/04/17 (from the past 48 hour(s))  Glucose, capillary     Status: Abnormal   Collection Time: 11/04/17  7:29 PM  Result Value Ref Range   Glucose-Capillary 191 (H) 65 - 99 mg/dL   Comment 1 Notify RN   Lipid panel     Status: None   Collection Time: 11/05/17  6:43 AM  Result Value Ref Range   Cholesterol 156 0 - 200 mg/dL   Triglycerides 562 <130 mg/dL   HDL 44 >86 mg/dL   Total CHOL/HDL Ratio 3.5 RATIO   VLDL 24 0 - 40 mg/dL   LDL Cholesterol 88 0 - 99 mg/dL    Comment:        Total Cholesterol/HDL:CHD Risk Coronary Heart Disease Risk Table                     Men   Women  1/2 Average Risk   3.4   3.3  Average Risk       5.0   4.4  2 X Average Risk   9.6    7.1  3 X Average Risk  23.4   11.0        Use the  calculated Patient Ratio above and the CHD Risk Table to determine the patient's CHD Risk.        ATP III CLASSIFICATION (LDL):  <100     mg/dL   Optimal  [id number]  mg/dL   Near or Above                    Optimal  130-159  mg/dL   Borderline  [id number]  mg/dL   High  >811>190     mg/dL   Very High   TSH     Status: None   Collection Time: 11/05/17  6:43 AM  Result Value Ref Range   TSH 0.863 0.350 - 4.500 uIU/mL    Comment: Performed by a 3rd Generation assay with a functional sensitivity of <=0.01 uIU/mL.  Glucose, capillary     Status: Abnormal   Collection Time: 11/05/17  6:54 AM  Result Value Ref Range   Glucose-Capillary 155 (H) 65 - 99 mg/dL   Comment 1 Notify RN     Blood Alcohol level:  Lab Results  Component Value Date   ETH <10 11/04/2017    Metabolic Disorder Labs:  Lab Results  Component Value Date   HGBA1C 9.3 (H) 10/13/2017   MPG 220.21 10/13/2017   MPG 228.82 07/30/2017   No results found for: PROLACTIN Lab Results  Component Value Date   CHOL 156 11/05/2017   TRIG 121 11/05/2017   HDL 44 11/05/2017   CHOLHDL 3.5 11/05/2017   VLDL 24 11/05/2017   LDLCALC 88 11/05/2017   LDLCALC 92 07/30/2017    Current Medications: Current Facility-Administered Medications  Medication Dose Route Frequency Provider Last Rate Last Dose  . alum & mag hydroxide-simeth (MAALOX/MYLANTA) 200-200-20 MG/5ML suspension 30 mL  30 mL Oral Q4H PRN Pucilowska, Jolanta B, MD      . gabapentin (NEURONTIN) capsule 300 mg  300 mg Oral TID Pucilowska, Jolanta B, MD   300 mg at 11/05/17 0747  . insulin aspart (novoLOG) injection 0-15 Units  0-15 Units Subcutaneous TID WC Pucilowska, Jolanta B, MD   3 Units at 11/05/17 0750  . insulin aspart (novoLOG) injection 0-5 Units  0-5 Units Subcutaneous QHS Pucilowska, Jolanta B, MD      . insulin aspart (novoLOG) injection 12 Units  12 Units Subcutaneous TID WC Pucilowska, Jolanta B, MD   12  Units at 11/05/17 0750  . insulin detemir (LEVEMIR) injection 50 Units  50 Units Subcutaneous QHS Pucilowska, Jolanta B, MD   50 Units at 11/04/17 2236  . magnesium hydroxide (MILK OF MAGNESIA) suspension 30 mL  30 mL Oral Daily PRN Pucilowska, Jolanta B, MD      . metFORMIN (GLUCOPHAGE) tablet 500 mg  500 mg Oral BID WC Pucilowska, Jolanta B, MD   500 mg at 11/05/17 0747  . pravastatin (PRAVACHOL) tablet 20 mg  20 mg Oral q1800 Pucilowska, Jolanta B, MD      . traZODone (DESYREL) tablet 100 mg  100 mg Oral QHS Pucilowska, Jolanta B, MD   100 mg at 11/04/17 2155   PTA Medications: Medications Prior to Admission  Medication Sig Dispense Refill Last Dose  . acetaminophen (TYLENOL) 500 MG tablet Take 1,000 mg by mouth every 6 (six) hours as needed for moderate pain.   Past Week at Unknown time  . albuterol (PROVENTIL HFA;VENTOLIN HFA) 108 (90 Base) MCG/ACT inhaler Inhale 2 puffs into the lungs every 6 (six) hours as needed for wheezing or shortness of breath.   unknown  .  atropine 1 % ophthalmic solution Place 1 drop into the right eye 3 (three) times daily.   11/03/2017 at Unknown time  . bacitracin-polymyxin b (POLYSPORIN) ophthalmic ointment Place 1 application into the right eye 4 (four) times daily.   11/03/2017 at Unknown time  . bismuth subsalicylate (PEPTO BISMOL) 262 MG/15ML suspension Take 30 mLs by mouth every 6 (six) hours as needed for indigestion.   Past Week at Unknown time  . brimonidine (ALPHAGAN) 0.2 % ophthalmic solution Place 1 drop into the right eye every 8 (eight) hours.   11/03/2017 at Unknown time  . dorzolamide (TRUSOPT) 2 % ophthalmic solution Place 1 drop into the right eye 3 (three) times daily.   Past Week at Unknown time  . gabapentin (NEURONTIN) 300 MG capsule Take 1 capsule (300 mg total) by mouth 2 (two) times daily. 180 capsule 0 11/03/2017 at Unknown time  . gabapentin (NEURONTIN) 300 MG capsule Take 1 capsule by mouth 2 (two) times daily.   11/03/2017 at Unknown time   . insulin aspart (NOVOLOG) 100 UNIT/ML injection Inject 15 Units into the skin 3 (three) times daily before meals. 15 units plus sliding scales.   11/03/2017 at Unknown time  . insulin detemir (LEVEMIR) 100 UNIT/ML injection Inject 58 Units at bedtime into the skin. Reported on 04/16/2016   11/03/2017 at Unknown time  . lovastatin (MEVACOR) 20 MG tablet Take 1 tablet (20 mg total) by mouth at bedtime. 30 tablet 3 11/03/2017 at Unknown time  . lovastatin (MEVACOR) 20 MG tablet Take 1 tablet by mouth at bedtime.   11/03/2017 at Unknown time  . metFORMIN (GLUCOPHAGE) 500 MG tablet Take 1 tablet (500 mg total) by mouth 2 (two) times daily with a meal. 180 tablet 1 11/03/2017 at Unknown time  . moxifloxacin (VIGAMOX) 0.5 % ophthalmic solution Place 1 drop into the right eye 4 (four) times daily.   11/03/2017 at Unknown time  . prednisoLONE acetate (PRED FORTE) 1 % ophthalmic suspension Place 1 drop into the right eye 4 (four) times daily.   11/03/2017 at Unknown time  . sitaGLIPtin (JANUVIA) 100 MG tablet Take 1 tablet (100 mg total) by mouth daily. 90 tablet 2 11/03/2017 at Unknown time  . timolol (BETIMOL) 0.5 % ophthalmic solution Place 1 drop into the right eye 2 (two) times daily.   11/03/2017 at Unknown time    Musculoskeletal: Strength & Muscle Tone: within normal limits Gait & Station: normal Patient leans: N/A  Psychiatric Specialty Exam: Physical Exam  Nursing note and vitals reviewed.   Review of Systems  All other systems reviewed and are negative.   Blood pressure 136/73, pulse 92, temperature 97.8 F (36.6 C), resp. rate 18, height 5\' 7"  (1.702 m), weight (!) 158.3 kg (349 lb), last menstrual period 11/03/2017.Body mass index is 54.66 kg/m.  General Appearance: Casual, morbidly obese, white patch on right eye  Eye Contact:  Good  Speech:  Clear and Coherent  Volume:  Normal  Mood:  Depressed  Affect:  Constricted  Thought Process:  Coherent and Goal Directed  Orientation:  Full  (Time, Place, and Person)  Thought Content:  Negative  Suicidal Thoughts:  Yes.  with intent/plan  Homicidal Thoughts:  Yes.  without intent/plan  Memory:  Immediate;   Fair  Judgement:  Fair  Insight:  Fair  Psychomotor Activity:  Normal  Concentration:  Concentration: Fair  Recall:  [employer] of Knowledge:  Fair  Language:  Fair  Assets:  Communication Skills Desire for Improvement Housing  ADL's:  Impaired  Cognition:  WNL  Sleep:  Number of Hours: 7    Treatment Plan Summary: 36 yo female admitted due to worsening depression and SI and HI in the context of many medical comorbidities and complications with uncontrolled diabetes and issues at home with her husband.  Pt does not follow with mental health providers but is interested in medications and follow up as an outpatient.   Plan:  MDD -Start Lexapro 10 mg daily  Anxiety -Increase Gabapentin to 600 mg BID (this is also for neuropathy)  Type I Diabetes -Diabetes care coordinator on board for insulin -Continue Levimir 50 units qhs -Novolog 12 units TID with meals -sliding scale -metformin 500 mg BID  HLD -Pravastatin  Macular Degeneration -Restart home eye drops  Dispo/Social -Pt will return home with her husband on dishcarage. She is unable to identify a support system but she does live with her husband who helps her manage medications. I spoke with her husband Vincenza Hews today and duty to warn was completed as she was having homicidal thoughts towards him.   Observation Level/Precautions:  15 minute checks  Laboratory:  Done in ED  Psychotherapy:    Medications:    Consultations:    Discharge Concerns:    Estimated LOS: 5-7 days  Other:     Physician Treatment Plan for Primary Diagnosis: Major depressive disorder, single episode, severe without psychosis (HCC) Long Term Goal(s): Improvement in symptoms so as ready for discharge  Short Term Goals: Ability to identify changes in lifestyle to reduce  recurrence of condition will improve and Ability to verbalize feelings will improve  Physician Treatment Plan for Secondary Diagnosis: Principal Problem:   Major depressive disorder, single episode, severe without psychosis (HCC) Active Problems:   Morbid obesity (HCC)   Diabetic polyneuropathy associated with type 2 diabetes mellitus (HCC)   Diabetic retinopathy associated with type 2 diabetes mellitus (HCC)   Uncontrolled type 2 diabetes mellitus with hyperglycemia, with long-term current use of insulin (HCC)   Suicidal ideation  Long Term Goal(s): Improvement in symptoms so as ready for discharge  Short Term Goals: Ability to identify changes in lifestyle to reduce recurrence of condition will improve, Ability to disclose and discuss suicidal ideas, Ability to demonstrate self-control will improve, Ability to identify and develop effective coping behaviors will improve, Compliance with prescribed medications will improve and Ability to identify triggers associated with substance abuse/mental health issues will improve  I certify that inpatient services furnished can reasonably be expected to improve the patient's condition.    Haskell Riling, MD 12/5/20188:12 AM

## 2017-11-05 NOTE — Progress Notes (Signed)
Skin assessment and Contrabands search completed by 2 female nurses (Ms Chyrl CivatteJoann, RN and Ms Cira ServantShatera, RN); no contrabands found on patient and in her belongings.

## 2017-11-06 LAB — GLUCOSE, CAPILLARY
GLUCOSE-CAPILLARY: 86 mg/dL (ref 65–99)
GLUCOSE-CAPILLARY: 141 mg/dL — AB (ref 65–99)
Glucose-Capillary: 148 mg/dL — ABNORMAL HIGH (ref 65–99)
Glucose-Capillary: 135 mg/dL — ABNORMAL HIGH (ref 65–99)

## 2017-11-06 MED ORDER — GABAPENTIN 600 MG PO TABS
300.0000 mg | ORAL_TABLET | Freq: Two times a day (BID) | ORAL | Status: DC
  Administered 2017-11-06 – 2017-11-07 (×2): 300 mg via ORAL
  Filled 2017-11-06 (×2): qty 1

## 2017-11-06 NOTE — [facility] Group Notes (Signed)
[facility] Group Notes:  (Nursing/MHT/Case Management/Adjunct)  Date:  11/06/2017  Time:  3:10 PM  Type of Therapy:  Psychoeducational Skills  Participation Level:  Active  Participation Quality:  Appropriate, Attentive and Sharing  Affect:  Appropriate  Cognitive:  Appropriate and Oriented  Insight:  Appropriate  Engagement in Group:  Engaged  Modes of Intervention:  Discussion and Education  Summary of Progress/Problems:  Rachel George 11/06/2017, 3:10 PM

## 2017-11-06 NOTE — Progress Notes (Signed)
Patient is alert and oriented x4. Ambulates the unit with steady gait. Compliant with medications and takes care of ADL's independently. Affect is improving in comparison to her admission. Patient states, "I want to shave today, my mother and husband are coming to visit me this evening." Patient present in the milieu, attends meetings and actively engages in the group setting. Noted to be compliant with her diet, milieu remains safe with q 15 minute safety checks. Will continue to monitor and notify MD of any acute changes. 

## 2017-11-06 NOTE — Plan of Care (Signed)
Patient is alert and oriented x4. Ambulates the unit with steady gait. Compliant with medications and takes care of ADL's independently. Affect is improving in comparison to her admission. Patient states, "I want to shave today, my mother and husband are coming to visit me this evening." Patient present in the milieu, attends meetings and actively engages in the group setting. Noted to be compliant with her diet, milieu remains safe with q 15 minute safety checks. Will continue to monitor and notify MD of any acute changes.

## 2017-11-06 NOTE — [facility] Group Notes (Signed)
[facility] Group Notes:  (Nursing/MHT/Case Management/Adjunct)  Date:  11/06/2017  Time:  11:48 PM  Type of Therapy:  Evening Wrap-up Group  Participation Level:  Minimal  Participation Quality:  Appropriate and Attentive  Affect:  Appropriate  Cognitive:  Alert and Appropriate  Insight:  Improving  Engagement in Group:  Developing/Improving and Engaged  Modes of Intervention:  Discussion, Socialization and Support  Summary of Progress/Problems:  Tomasita MorrowChelsea Nanta Elwyn Lowden 11/06/2017, 11:48 PM

## 2017-11-06 NOTE — [facility] Group Notes (Signed)
[facility] LCSW Group Therapy Note  Date/Time: 11/06/17, 0930  Type of Therapy/Topic:  Group Therapy:  Balance in Life  Participation Level:  active  Description of Group:    This group will address the concept of balance and how it feels and looks when one is unbalanced. Patients will be encouraged to process areas in their lives that are out of balance, and identify reasons for remaining unbalanced. Facilitators will guide patients utilizing problem- solving interventions to address and correct the stressor making their life unbalanced. Understanding and applying boundaries will be explored and addressed for obtaining  and maintaining a balanced life. Patients will be encouraged to explore ways to assertively make their unbalanced needs known to significant others in their lives, using other group members and facilitator for support and feedback.  Therapeutic Goals: 1. Patient will identify two or more emotions or situations they have that consume much of in their lives. 2. Patient will identify signs/triggers that life has become out of balance:  3. Patient will identify two ways to set boundaries in order to achieve balance in their lives:  4. Patient will demonstrate ability to communicate their needs through discussion and/or role plays  Summary of Patient Progress: Pt shared that health, family, and financial are areas that are out of balance for her currently.  Pt took part in discussion regarding ways to take steps in a better direction one at a time when life feels out of balance and stressful. Pt shared quite a bit about her situation and was appropriate and attentive.           Therapeutic Modalities:   Cognitive Behavioral Therapy Solution-Focused Therapy Assertiveness Training  Daleen SquibbGreg Beaux Wedemeyer, [place]

## 2017-11-06 NOTE — Progress Notes (Signed)
[facility] MD Progress Note  11/06/2017 11:10 AM Rachel George  MRN:  [id number]   Subjective:  Pt states that she is feeling slightly better today. She has talked to her husband on the phone and they are going to talk about how he can better support her. She has great insight into herself and things she needs to do. She processed that she is going through a grieving process and was in the anger stage. She states that she will eventually get to the acceptance stage but acknowledges that it will be hard. She states that she knows she can't just sit at home and needs to get out and do more things. She told this to her husband and she plans to hang out with her mother more through the day. She states that she also wants to try to volunteer at a patient care facility. 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 She states that she had a lot of diarrhea and felt very off balance last night. She thinks it is from the increase in the dose of gabapentin. Discussed that Lexapro can cause some diarrhea but she states that she feels it is from the gabapentin because it has happened in the past when she had higher doses. She states that her husband and mother are coming to visit tonight and she is looking forward to this. She denies SI today. She also denies HI or thoughts of harming her husband.   Principal Problem: Major depressive disorder, single episode, severe without psychosis (HCC) Diagnosis:   Patient Active Problem List   Diagnosis Date Noted  . Major depressive disorder, single episode, severe without psychosis (HCC) [F32.2] 11/04/2017  . Suicidal ideation [R45.851] 11/04/2017  . 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 Uncontrolled type 2 diabetes mellitus with hyperglycemia, with long-term current use of insulin (HCC) [E11.65,  Z79.4] 05/01/2017  . Obesity, Class III, BMI 40-49.9 (morbid obesity) (HCC) [E66.01] 05/01/2017  . Morbid obesity with BMI of 50.0-59.9, adult (HCC) [E66.01, Z68.43] 05/01/2017  . Diabetic retinopathy associated with type 2 diabetes mellitus (HCC) [E11.319] 03/18/2017  . S/P amputation [Z89.9] 02/06/2017  . Osteomyelitis (HCC) [M86.9] 12/13/2016  . Diabetic foot ulcer (HCC) [W09.811, L97.509] 10/15/2016  . Cellulitis of toe of right foot [L03.031] 10/07/2016  . Uncontrolled type 2 diabetes mellitus with complication (HCC) [E11.8, E11.65] 09/09/2016  . Morbid obesity (HCC) [E66.01] 09/09/2016  . Diabetic polyneuropathy associated with type 2 diabetes mellitus (HCC) [E11.42] 09/09/2016   Total Time spent with patient: 20 minutes  Past Psychiatric History: See H&P  Past Medical History:  Past Medical History:  Diagnosis Date  . Cataract   . Diabetes mellitus without complication (HCC)    dx age 1  . Fatty liver disease, nonalcoholic   . Macular degeneration   . Partial blindness     Past Surgical History:  Procedure Laterality Date  . AMPUTATION TOE Right 12/16/2016   Procedure: RIGHT FIRST TOE AMPUTATION;  Surgeon: Ancil Linsey, MD;  Location: [facility];  Service: General;  Laterality: Right;  Right first toe amputation for osteomyelitis  . eye lid transplant    . RETINAL LASER PROCEDURE     Family History:  Family History  Problem Relation Age of Onset  . Diabetes Father    Family Psychiatric  History: See H&P  Social History:  Social History   Substance and Sexual Activity  Alcohol Use No     Social History   Substance and Sexual Activity  Drug Use No    Social History   Socioeconomic History  . Marital status: Married    Spouse name: Christena DeemShane Cavazos  . Number of children: None  . Years of education: None  . Highest education level: None  Social Needs  . Financial resource strain: None  . Food insecurity - worry: None  . Food insecurity - inability: None   . Transportation needs - medical: None  . Transportation needs - non-medical: None  Occupational History  . Occupation: CNA personal care giver  Tobacco Use  . Smoking status: Former Smoker    Packs/day: 0.25    Types: Cigarettes    Last attempt to quit: 12/11/2016    Years since quitting: 0.9  . Smokeless tobacco: Never Used  Substance and Sexual Activity  . Alcohol use: No  . Drug use: No  . Sexual activity: Yes    Partners: Male    Birth control/protection: None  Other Topics Concern  . None  Social History Narrative  . None   Additional Social History:                         Sleep: Good  Appetite:  Good  Current Medications: Current Facility-Administered Medications  Medication Dose Route Frequency Provider Last Rate Last Dose  . alum & mag hydroxide-simeth (MAALOX/MYLANTA) 200-200-20 MG/5ML suspension 30 mL  30 mL Oral Q4H PRN Pucilowska, Jolanta B, MD      . atropine 1 % ophthalmic solution 1 drop  1 drop Right Eye TID Haskell RilingMcNew, Ustin Cruickshank R, MD   1 drop at 11/06/17 0834  . bacitracin-polymyxin b (POLYSPORIN) ophthalmic ointment 1 application  1 application Right Eye QID Baili Stang, Ileene HutchinsonHolly R, MD   1 application at 11/06/17 0857  . brimonidine (ALPHAGAN) 0.2 % ophthalmic solution 1 drop  1 drop Right Eye Q8H Breelynn Bankert, Ileene HutchinsonHolly R, MD   1 drop at 11/06/17 [phone]  . dorzolamide (TRUSOPT) 2 % ophthalmic solution 1 drop  1 drop Right Eye TID Haskell RilingMcNew, Adelie Croswell R, MD   1 drop at 11/06/17 0834  . escitalopram (LEXAPRO) tablet 10 mg  10 mg Oral Daily Carsynn Bethune, Ileene HutchinsonHolly R, MD   10 mg at 11/06/17 [id number]  . gabapentin (NEURONTIN) tablet 600 mg  600 mg Oral BID Haskell RilingMcNew, Eldrige Pitkin R, MD   600 mg at 11/05/17 1732  . ibuprofen (ADVIL,MOTRIN) tablet 400 mg  400 mg Oral Q4H PRN Haskell RilingMcNew, Skyra Crichlow R, MD   400 mg at 11/05/17 1056  . insulin aspart (novoLOG) injection 0-15 Units  0-15 Units Subcutaneous TID WC Pucilowska, Jolanta B, MD   2 Units at 11/06/17 0831  . insulin aspart (novoLOG) injection 0-5 Units  0-5 Units  Subcutaneous QHS Pucilowska, Jolanta B, MD      . insulin aspart (novoLOG) injection 12 Units  12 Units Subcutaneous TID WC Pucilowska, Jolanta B, MD   12 Units at 11/06/17 0830  . insulin detemir (LEVEMIR) injection 50 Units  50 Units Subcutaneous QHS Pucilowska, Jolanta B, MD   50 Units at 11/04/17 2236  . loperamide (IMODIUM) capsule 2 mg  2 mg Oral PRN Haskell RilingMcNew, Donn Zanetti R, MD   2 mg at 11/05/17 1547  . magnesium hydroxide (MILK OF MAGNESIA) suspension 30 mL  30 mL Oral Daily PRN Pucilowska, Jolanta B, MD      .  metFORMIN (GLUCOPHAGE) tablet 500 mg  500 mg Oral BID WC Pucilowska, Jolanta B, MD   500 mg at 11/06/17 0854  . pravastatin (PRAVACHOL) tablet 20 mg  20 mg Oral q1800 Pucilowska, Jolanta B, MD   20 mg at 11/05/17 1732  . timolol (TIMOPTIC) 0.5 % ophthalmic solution 1 drop  1 drop Right Eye BID Haskell Riling, MD   1 drop at 11/06/17 [phone]  . traZODone (DESYREL) tablet 100 mg  100 mg Oral QHS PRN Karinna Beadles, Ileene Hutchinson, MD        Lab Results:  Results for orders placed or performed during the hospital encounter of 11/04/17 (from the past 48 hour(s))  Glucose, capillary     Status: Abnormal   Collection Time: 11/04/17  7:29 PM  Result Value Ref Range   Glucose-Capillary 191 (H) 65 - 99 mg/dL   Comment 1 Notify RN   Hemoglobin A1c     Status: Abnormal   Collection Time: 11/05/17  6:43 AM  Result Value Ref Range   Hgb A1c MFr Bld 8.5 (H) 4.8 - 5.6 %    Comment: (NOTE) Pre diabetes:          5.7%-6.4% Diabetes:              >6.4% Glycemic control for   <7.0% adults with diabetes    Mean Plasma Glucose 197.25 mg/dL    Comment: Performed at [facility], [address], [place], [place] [address]  Lipid panel     Status: None   Collection Time: 11/05/17  6:43 AM  Result Value Ref Range   Cholesterol 156 0 - 200 mg/dL   Triglycerides 409 <811 mg/dL   HDL 44 >91 mg/dL   Total CHOL/HDL Ratio 3.5 RATIO   VLDL 24 0 - 40 mg/dL   LDL Cholesterol 88 0 - 99 mg/dL    Comment:        Total  Cholesterol/HDL:CHD Risk Coronary Heart Disease Risk Table                     Men   Women  1/2 Average Risk   3.4   3.3  Average Risk       5.0   4.4  2 X Average Risk   9.6   7.1  3 X Average Risk  23.4   11.0        Use the calculated Patient Ratio above and the CHD Risk Table to determine the patient's CHD Risk.        ATP III CLASSIFICATION (LDL):  <100     mg/dL   Optimal  478-295  mg/dL   Near or Above                    Optimal  130-159  mg/dL   Borderline  621-308  mg/dL   High  >657     mg/dL   Very High   TSH     Status: None   Collection Time: 11/05/17  6:43 AM  Result Value Ref Range   TSH 0.863 0.350 - 4.500 uIU/mL    Comment: Performed by a 3rd Generation assay with a functional sensitivity of <=0.01 uIU/mL.  Glucose, capillary     Status: Abnormal   Collection Time: 11/05/17  6:54 AM  Result Value Ref Range   Glucose-Capillary 155 (H) 65 - 99 mg/dL   Comment 1 Notify RN   Glucose, capillary     Status:  None   Collection Time: 11/05/17 11:29 AM  Result Value Ref Range   Glucose-Capillary 90 65 - 99 mg/dL  Glucose, capillary     Status: Abnormal   Collection Time: 11/05/17  4:31 PM  Result Value Ref Range   Glucose-Capillary 111 (H) 65 - 99 mg/dL  Glucose, capillary     Status: None   Collection Time: 11/05/17  9:56 PM  Result Value Ref Range   Glucose-Capillary 81 65 - 99 mg/dL   Comment 1 Notify RN   Glucose, capillary     Status: Abnormal   Collection Time: 11/06/17  7:10 AM  Result Value Ref Range   Glucose-Capillary 148 (H) 65 - 99 mg/dL    Blood Alcohol level:  Lab Results  Component Value Date   ETH <10 11/04/2017    Metabolic Disorder Labs: Lab Results  Component Value Date   HGBA1C 8.5 (H) 11/05/2017   MPG 197.25 11/05/2017   MPG 220.21 10/13/2017   No results found for: PROLACTIN Lab Results  Component Value Date   CHOL 156 11/05/2017   TRIG 121 11/05/2017   HDL 44 11/05/2017   CHOLHDL 3.5 11/05/2017   VLDL 24 11/05/2017    LDLCALC 88 11/05/2017   LDLCALC 92 07/30/2017    Physical Findings: AIMS: Facial and Oral Movements Muscles of Facial Expression: None, normal Lips and Perioral Area: None, normal Jaw: None, normal Tongue: None, normal,Extremity Movements Upper (arms, wrists, hands, fingers): None, normal Lower (legs, knees, ankles, toes): None, normal, Trunk Movements Neck, shoulders, hips: None, normal, Overall Severity Severity of abnormal movements (highest score from questions above): None, normal Incapacitation due to abnormal movements: None, normal Patient's awareness of abnormal movements (rate only patient's report): No Awareness, Dental Status Current problems with teeth and/or dentures?: No Does patient usually wear dentures?: No  CIWA:    COWS:     Musculoskeletal: Strength & Muscle Tone: within normal limits Gait & Station: normal Patient leans: N/A  Psychiatric Specialty Exam: Physical Exam  ROS  Blood pressure 122/87, pulse 90, temperature 97.8 F (36.6 C), resp. rate 18, height 5\' 7"  (1.702 m), weight (!) 158.3 kg (349 lb), last menstrual period 11/03/2017.Body mass index is 54.66 kg/m.  General Appearance: Casual  Eye Contact:  Fair  Speech:  Clear and Coherent  Volume:  Normal  Mood:  Depressed  Affect:  Congruent  Thought Process:  Goal Directed  Orientation:  Full (Time, Place, and Person)  Thought Content:  Negative  Suicidal Thoughts:  No  Homicidal Thoughts:  No  Memory:  Immediate;   Fair  Judgement:  Fair  Insight:  Fair  Psychomotor Activity:  Normal  Concentration:  Concentration: Fair  Recall:  [employer] of Knowledge:  Fair  Language:  Fair  Akathisia:  No      Assets:  Communication Skills Desire for Improvement Financial Resources/Insurance  ADL's:  Intact  Cognition:  WNL  Sleep:  Number of Hours: 7.25     Treatment Plan Summary: 36 yo female admitted due to SI and HI related to depression and medical comorbidites. Pt is feeling slightly  better today. Suicidal and homicidal thoughts are resolving. She has great insight into her symptoms and things she needs to do for herself. She is tolerating Lexapro but had some side effects to Gabapentin increase yesterday.  MDD -Continue Lexapro 10 mg daily  Anxiety -Decrease Gabapentin to 300 mg BIDdue to side effects  Type I Diabetes -DM care coordinator on board -Continue Levimir 50 units  qhs -Novolog 12 units TID Sliding scale -Metformin  HLD -Pravastatin  Macular Degeneration -home eye drops  Dispo Social -Pt will return home with her husband on discharge. Her husband and mother are supportive.  Haskell RilingHolly R Makinzee Durley, MD 11/06/2017, 11:10 AM

## 2017-11-06 NOTE — Progress Notes (Signed)
Recreation Therapy Notes   Date: 12.06.2018  Time: 3:00pm  Location: Craft room  Behavioral response: Appropriate  Group Type: Art/Craft  Participation level: Active  Communication: Patient was social with peers and staff.  Comments: N/A  Tiena Manansala LRT/CTRS        Crisanto Nied 11/06/2017 4:39 PM

## 2017-11-06 NOTE — Progress Notes (Signed)
Patient ID: Rachel BarkerKaren George, female   DOB: November 09, 1981, 36 y.o.   MRN: [id number]   D: Pt observed in front of med room flat and concerned; "my blood sugar was low; I have never been this low before and I am worried that I may bottom out by morning."-Pt's CBG was 81. Pt at the time [id number] assessment denied anxiety, depression, SI, HI, pain or AVH; "I have come to terms with my problems; I just have to keep doing right."  A: Pt educated on normal and abnormal CBGs. Medications offered as prescribed. Pt given the opportunity to ask questions and state concerns. Support, encouragement, and safe environment provided.  R: Pt verbally acknowledged the teachings. Pt was med compliant. All patient's questions and concerns addressed. 15-minute safety checks continue. Safety checks continue. Pt did not attend wrap-up group

## 2017-11-06 NOTE — Progress Notes (Signed)
Recreation Therapy Notes  Date: 12.06.2018  Time: 1:00pm  Location: Craft Room  Behavioral response: Appropriate  Intervention Topic: Teamwork  Discussion/Intervention: Group content on today was focused on teamwork. The group identified what teamwork is. Individuals described who is a part of their team. Patients expressed why they thought teamwork is important. The group stated reasons why they thought it was easier to work with a Comptrollersmaller/larger team. Individuals discussed some positives and negatives of working with a team. Patients gave examples of past experiences they had while working with a team. The group participated in the intervention "Save the [employer]. Charles[place]alls", patients were in groups and had to keep the balls on the surface given.  Clinical Observations/Feedback:  Patient came to group and stated teamwork is people understanding each other. She gave examples from her past work experience to relate it to team work. Individual identified her family and community as part of her team. Patient express that team work takes Special educational needs teachercommunication, Lexicographerplanning and organization. She participated in the intervention and was social with her peers and staff during group.  Santiaga Butzin LRT/CTRS         Cristianna Cyr 11/06/2017 1:47 PM

## 2017-11-06 NOTE — [facility] Group Notes (Signed)
  11/06/2017 9am  Type of Therapy and Topic: Group Therapy: Goals Group: SMART Goals   Participation Level: Active  Description of Group:    The purpose of a daily goals group is to assist and guide patients in setting recovery/wellness-related goals. The objective is to set goals as they relate to the crisis in which they were admitted. Patients will be using SMART goal modalities to set measurable goals. Characteristics of realistic goals will be discussed and patients will be assisted in setting and processing how one will reach their goal. Facilitator will also assist patients in applying interventions and coping skills learned in psycho-education groups to the SMART goal and process how one will achieve defined goal.   Therapeutic Goals:   -Patients will develop and document one goal related to or their crisis in which brought them into treatment.  -Patients will be guided by LCSW using SMART goal setting modality in how to set a measurable, attainable, realistic and time sensitive goal.  -Patients will process barriers in reaching goal.  -Patients will process interventions in how to overcome and successful in reaching goal.   Patient's Goal: "To me more involved by communicating with others and attending groups."  Therapeutic Modalities:  Motivational Interviewing  Cognitive Behavioral Therapy  Crisis Intervention Model  SMART goals setting  Johny ShearsCassandra  Philisha Weinel, LCSW 11/06/2017 10:44 AM

## 2017-11-07 LAB — GLUCOSE, CAPILLARY
GLUCOSE-CAPILLARY: 141 mg/dL — AB (ref 65–99)
GLUCOSE-CAPILLARY: 86 mg/dL (ref 65–99)
GLUCOSE-CAPILLARY: 95 mg/dL (ref 65–99)
Glucose-Capillary: 125 mg/dL — ABNORMAL HIGH (ref 65–99)

## 2017-11-07 MED ORDER — PREMIER PROTEIN SHAKE
11.0000 [oz_av] | Freq: Two times a day (BID) | ORAL | Status: DC
  Administered 2017-11-07 – 2017-11-10 (×5): 11 [oz_av] via ORAL

## 2017-11-07 MED ORDER — GABAPENTIN 600 MG PO TABS
300.0000 mg | ORAL_TABLET | Freq: Two times a day (BID) | ORAL | Status: DC
  Administered 2017-11-07 – 2017-11-13 (×12): 300 mg via ORAL
  Filled 2017-11-07 (×12): qty 1

## 2017-11-07 NOTE — Progress Notes (Signed)
[facility]  11/07/2017 12:52 PM Rachel BarkerKaren Herald  MRN:  [id number] Subjective:  Pt states that she is feeling agitated and depressed today. She states that she did not sleep well last night and was very frustrated today. She is perseverate about her diet and does not feel she is getting enough food. She is frustrated because she does not feel hat they are accurately counting her carbs. She states that she feels very hungry and this caused her to not sleep well and feel irritable. 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 She states that she is trying to think more hopeful and positive that she will be able to work again but in a different role. She denies SI or thoughts of self harm today. She had a visit by her husband and it went well. She states that she feels he is going to be more supportive of her.   Principal Problem: Major depressive disorder, single episode, severe without psychosis (HCC) Diagnosis:   Patient Active Problem List   Diagnosis Date Noted  . Major depressive disorder, single episode, severe without psychosis (HCC) [F32.2] 11/04/2017  . Suicidal ideation [R45.851] 11/04/2017  . 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 Uncontrolled type 2 diabetes mellitus with hyperglycemia, with long-term current use of insulin (HCC) [E11.65, Z79.4] 05/01/2017  . Obesity, Class III, BMI 40-49.9 (morbid obesity) (HCC) [E66.01] 05/01/2017  . Morbid obesity with BMI of 50.0-59.9, adult (HCC) [E66.01, Z68.43] 05/01/2017  . Diabetic retinopathy associated with type 2 diabetes mellitus (HCC) [E11.319] 03/18/2017  . S/P amputation [Z89.9] 02/06/2017  . Osteomyelitis (HCC) [M86.9] 12/13/2016  . Diabetic foot ulcer (HCC) [W09.811[E11.621, L97.509] 10/15/2016  . Cellulitis of toe of right foot [L03.031] 10/07/2016  . Uncontrolled type 2 diabetes mellitus with complication (HCC) [E11.8, E11.65]  09/09/2016  . Morbid obesity (HCC) [E66.01] 09/09/2016  . Diabetic polyneuropathy associated with type 2 diabetes mellitus (HCC) [E11.42] 09/09/2016   Total Time spent with patient: 20 minutes  Past Psychiatric History: See H&P  Past Medical History:  Past Medical History:  Diagnosis Date  . Cataract   . Diabetes mellitus without complication (HCC)    dx age 36  . Fatty liver disease, nonalcoholic   . Macular degeneration   . Partial blindness     Past Surgical History:  Procedure Laterality Date  . AMPUTATION TOE Right 12/16/2016   Procedure: RIGHT FIRST TOE AMPUTATION;  Surgeon: Ancil LinseyJason Evan Davis, MD;  Location: [facility];  Service: General;  Laterality: Right;  Right first toe amputation for osteomyelitis  . eye lid transplant    . RETINAL LASER PROCEDURE     Family History:  Family History  Problem Relation Age of Onset  . Diabetes Father    Family Psychiatric  History: See H&P Social History:  Social History   Substance and Sexual Activity  Alcohol Use No     Social History   Substance and Sexual Activity  Drug Use No    Social History   Socioeconomic History  . Marital status: Married    Spouse name: Christena DeemShane Piotrowski  . Number of children: None  . Years of education: None  . Highest education level: None  Social Needs  . Financial resource strain: None  . Food insecurity - worry: None  . Food insecurity - inability: None  . Transportation needs - medical: None  . Transportation needs - non-medical:  None  Occupational History  . Occupation: CNA personal care giver  Tobacco Use  . Smoking status: Former Smoker    Packs/day: 0.25    Types: Cigarettes    Last attempt to quit: 12/11/2016    Years since quitting: 0.9  . Smokeless tobacco: Never Used  Substance and Sexual Activity  . Alcohol use: No  . Drug use: No  . Sexual activity: Yes    Partners: Male    Birth control/protection: None  Other Topics Concern  . None  Social History Narrative  .  None   Additional Social History:                         Sleep: Poor  Appetite:  Good  Current Medications: Current Facility-Administered Medications  Medication Dose Route Frequency Provider Last Rate Last Dose  . alum & mag hydroxide-simeth (MAALOX/MYLANTA) 200-200-20 MG/5ML suspension 30 mL  30 mL Oral Q4H PRN Pucilowska, Jolanta B, MD      . atropine 1 % ophthalmic solution 1 drop  1 drop Right Eye TID Haskell RilingMcNew, Parke Jandreau R, MD   1 drop at 11/07/17 1138  . bacitracin-polymyxin b (POLYSPORIN) ophthalmic ointment 1 application  1 application Right Eye QID Haskell RilingMcNew, Midge Momon R, MD   1 application at 11/06/17 2116  . brimonidine (ALPHAGAN) 0.2 % ophthalmic solution 1 drop  1 drop Right Eye Q8H Elysse Polidore, Ileene HutchinsonHolly R, MD   1 drop at 11/07/17 [phone]  . dorzolamide (TRUSOPT) 2 % ophthalmic solution 1 drop  1 drop Right Eye TID Haskell RilingMcNew, Jhoel Stieg R, MD   1 drop at 11/07/17 1138  . escitalopram (LEXAPRO) tablet 10 mg  10 mg Oral Daily Jairon Ripberger, Ileene HutchinsonHolly R, MD   10 mg at 11/07/17 0813  . gabapentin (NEURONTIN) tablet 300 mg  300 mg Oral BID Haskell RilingMcNew, Ariel Dimitri R, MD   300 mg at 11/07/17 0813  . ibuprofen (ADVIL,MOTRIN) tablet 400 mg  400 mg Oral Q4H PRN Haskell RilingMcNew, Fedora Knisely R, MD   400 mg at 11/06/17 2129  . insulin aspart (novoLOG) injection 0-15 Units  0-15 Units Subcutaneous TID WC Pucilowska, Jolanta B, MD   2 Units at 11/07/17 1139  . insulin aspart (novoLOG) injection 0-5 Units  0-5 Units Subcutaneous QHS Pucilowska, Jolanta B, MD      . insulin aspart (novoLOG) injection 12 Units  12 Units Subcutaneous TID WC Pucilowska, Jolanta B, MD   12 Units at 11/07/17 1141  . insulin detemir (LEVEMIR) injection 50 Units  50 Units Subcutaneous QHS Pucilowska, Jolanta B, MD   50 Units at 11/06/17 2123  . loperamide (IMODIUM) capsule 2 mg  2 mg Oral PRN Haskell RilingMcNew, Timmy Bubeck R, MD   2 mg at 11/05/17 1547  . magnesium hydroxide (MILK OF MAGNESIA) suspension 30 mL  30 mL Oral Daily PRN Pucilowska, Jolanta B, MD      . metFORMIN (GLUCOPHAGE) tablet 500  mg  500 mg Oral BID WC Pucilowska, Jolanta B, MD   500 mg at 11/07/17 0812  . pravastatin (PRAVACHOL) tablet 20 mg  20 mg Oral q1800 Pucilowska, Jolanta B, MD   20 mg at 11/06/17 1738  . timolol (TIMOPTIC) 0.5 % ophthalmic solution 1 drop  1 drop Right Eye BID Haskell RilingMcNew, Ian Castagna R, MD   1 drop at 11/07/17 0814  . traZODone (DESYREL) tablet 100 mg  100 mg Oral QHS PRN Haskell RilingMcNew, Chabely Norby R, MD   100 mg at 11/06/17 2129    Lab Results:  Results for orders  placed or performed during the hospital encounter of 11/04/17 (from the past 48 hour(s))  Glucose, capillary     Status: Abnormal   Collection Time: 11/05/17  4:31 PM  Result Value Ref Range   Glucose-Capillary 111 (H) 65 - 99 mg/dL  Glucose, capillary     Status: None   Collection Time: 11/05/17  9:56 PM  Result Value Ref Range   Glucose-Capillary 81 65 - 99 mg/dL   Comment 1 Notify RN   Glucose, capillary     Status: Abnormal   Collection Time: 11/06/17  7:10 AM  Result Value Ref Range   Glucose-Capillary 148 (H) 65 - 99 mg/dL  Glucose, capillary     Status: Abnormal   Collection Time: 11/06/17 11:12 AM  Result Value Ref Range   Glucose-Capillary 135 (H) 65 - 99 mg/dL  Glucose, capillary     Status: None   Collection Time: 11/06/17  4:27 PM  Result Value Ref Range   Glucose-Capillary 86 65 - 99 mg/dL  Glucose, capillary     Status: Abnormal   Collection Time: 11/06/17  8:15 PM  Result Value Ref Range   Glucose-Capillary 141 (H) 65 - 99 mg/dL   Comment 1 Notify RN   Glucose, capillary     Status: Abnormal   Collection Time: 11/07/17  7:05 AM  Result Value Ref Range   Glucose-Capillary 141 (H) 65 - 99 mg/dL   Comment 1 Notify RN   Glucose, capillary     Status: Abnormal   Collection Time: 11/07/17 11:33 AM  Result Value Ref Range   Glucose-Capillary 125 (H) 65 - 99 mg/dL   Comment 1 Notify RN     Blood Alcohol level:  Lab Results  Component Value Date   ETH <10 11/04/2017    Metabolic Disorder Labs: Lab Results  Component  Value Date   HGBA1C 8.5 (H) 11/05/2017   MPG 197.25 11/05/2017   MPG 220.21 10/13/2017   No results found for: PROLACTIN Lab Results  Component Value Date   CHOL 156 11/05/2017   TRIG 121 11/05/2017   HDL 44 11/05/2017   CHOLHDL 3.5 11/05/2017   VLDL 24 11/05/2017   LDLCALC 88 11/05/2017   LDLCALC 92 07/30/2017    Physical Findings: AIMS: Facial and Oral Movements Muscles of Facial Expression: None, normal Lips and Perioral Area: None, normal Jaw: None, normal Tongue: None, normal,Extremity Movements Upper (arms, wrists, hands, fingers): None, normal Lower (legs, knees, ankles, toes): None, normal, Trunk Movements Neck, shoulders, hips: None, normal, Overall Severity Severity of abnormal movements (highest score from questions above): None, normal Incapacitation due to abnormal movements: None, normal Patient's awareness of abnormal movements (rate only patient's report): No Awareness, Dental Status Current problems with teeth and/or dentures?: No Does patient usually wear dentures?: No  CIWA:    COWS:     Musculoskeletal: Strength & Muscle Tone: within normal limits Gait & Station: normal Patient leans: N/A  Psychiatric Specialty Exam: Physical Exam  Nursing note and vitals reviewed.   Review of Systems  All other systems reviewed and are negative.   Blood pressure 115/66, pulse 77, temperature 98.2 F (36.8 C), temperature source Oral, resp. rate 18, height 5\' 7"  (1.702 m), weight (!) 158.3 kg (349 lb), last menstrual period 11/03/2017.Body mass index is 54.66 kg/m.  General Appearance: Casual  Eye Contact:  Good  Speech:  Clear and Coherent  Volume:  Normal  Mood:  Depressed  Affect:  Congruent  Thought Process:  Coherent and Goal Directed  Orientation:  Full (Time, Place, and Person)  Thought Content:  Negative  Suicidal Thoughts:  No  Homicidal Thoughts:  No  Memory:  Immediate;   Fair  Judgement:  Fair  Insight:  Fair  Psychomotor Activity:  Normal   Concentration:  Concentration: Fair  Recall:  [employer] of Knowledge:  Fair  Language:  Fair  Akathisia:  No      Assets:  Communication Skills Desire for Improvement  ADL's:  Intact  Cognition:  WNL  Sleep:  Number of Hours: 7.15     Treatment Plan Summary: 36 yo female admitted for SI and HI. Pt still feels depressed and agitated but SI and HI are resolving. She is trying to think more positively. She has good insight into herself and things she needs to do to feel better.  Plan:  MDD -Continue Lexapro 10 mg daily  Anxiety -Gabapentin 300 mg BID. This is also for neuropathy  Type I diabetes -DM care coordinator on board -LEvimir 50 units qhs -Novolog 12 units TID -sliding scale -Metformin  HLD -Pravastatin  Macular Degeneration -home eye drops  Dispo/Social -Pt will return home with her husband on discharge.    Haskell Riling, MD 11/07/2017, 12:52 PM

## 2017-11-07 NOTE — Plan of Care (Signed)
Patient slept for Estimated Hours of 7.15; Precautionary checks every 15 minutes for safety maintained, room free of safety hazards, patient sustains no injury or falls during this shift.  

## 2017-11-07 NOTE — Progress Notes (Signed)
Visible in the dayroom, pleasant on contact. Denies all psych symptoms tonight. Appears sad and depressed. Was med compliant. Remains on routine obs for safety.

## 2017-11-07 NOTE — [facility] Group Notes (Signed)
[facility] LCSW Group Therapy Note  Date/Time: 11/07/17, 1400  Type of Therapy and Topic:  Group Therapy:  Feelings around Relapse and Recovery  Participation Level:  Active   Mood:pleasant  Description of Group:    Patients in this group will discuss emotions they experience before and after a relapse. They will process how experiencing these feelings, or avoidance of experiencing them, relates to having a relapse. Facilitator will guide patients to explore emotions they have related to recovery. Patients will be encouraged to process which emotions are more powerful. They will be guided to discuss the emotional reaction significant others in their lives may have to patients' relapse or recovery. Patients will be assisted in exploring ways to respond to the emotions of others without this contributing to a relapse.  Therapeutic Goals: 1. Patient will identify two or more emotions that lead to relapse for them:  2. Patient will identify two emotions that result when they relapse:  3. Patient will identify two emotions related to recovery:  4. Patient will demonstrate ability to communicate their needs through discussion and/or role plays.   Summary of Patient Progress: Pt did not share substance use history. Pt did share that anxiety is a difficult emotion for her.  PT was attentive in group and made several contributions to the discussion.     Therapeutic Modalities:   Cognitive Behavioral Therapy Solution-Focused Therapy Assertiveness Training Relapse Prevention Therapy  Daleen SquibbGreg Kapena Hamme, LCSW

## 2017-11-07 NOTE — Plan of Care (Signed)
Patient is alert and oriented x 4. Denies SI/HI/AVH and pain. Patient states, "I don't want to hurt myself but today I feel a little bit angry and I don't want to blow up like I do at home." Patient encouraged to talk to staff about her feelings but patient stated that she will just go to  her room and meditate. Patient has been attending groups and actively participates. Compliant with her medications and BS checks. Milieu remains safe with q 15 minute checks.

## 2017-11-07 NOTE — Progress Notes (Signed)
Patient ID: Rachel BarkerKaren George, female   DOB: February 14, 1981, 36 y.o.   MRN: [id number] Unremarkable in street clothes, mood and affect bright, upbeat, pleasant, visitation by husband, according to patient; denied SI/HI.

## 2017-11-07 NOTE — Progress Notes (Signed)
Recreation Therapy Notes   Date: 12.07.2018  Time:  1:00pm   Location: Craft Room  Behavioral response: Appropriate  Intervention Topic: Stress   Discussion/Intervention: Group content on today was focused on stress. The group defined stress and way to cope with stress. Participants expressed how they know when they are stresses out. Individuals described the different ways they have to cope with stress. The group stated reasons why it is important to cope with stress. Patient explained what good stress is and some examples. The group participated in the intervention "Stress Management Jeopardy". Individuals were separated into two group and answered questions related to stress.   Clinical Observations/Feedback:  Patient came to group and defined stress as  The way you feel from situations and how you handle things.She stated someone's emotional state can determine how they deal with stress. Individual explained that if you do not handle stress it can lead to other health problems.   Fayrene Towner LRT/CTRS         Kerissa Coia 11/07/2017 2:23 PM

## 2017-11-07 NOTE — Progress Notes (Signed)
Patient attended afternoon support group. Patient participated in activities.  

## 2017-11-07 NOTE — Progress Notes (Signed)
   11/06/17 2055  Psych Admission Type (Psych Patients Only)  Admission Status Voluntary  Psychosocial Assessment  Patient Complaints Depression;Worrying  Eye Contact Brief  Facial Expression Flat;Sad;Worried  Affect Appropriate to circumstance  Speech Logical/coherent  Interaction Assertive  Motor Activity Slow (Steady)  Appearance/Hygiene Meticulous;Unremarkable  Behavior Characteristics Cooperative;Appropriate to situation;Calm  Mood Pleasant  Aggressive Behavior  Targets Self;Family  Effect No apparent injury  Thought Process  Coherency WDL  Content WDL  Delusions None reported or observed  Perception WDL  Hallucination None reported or observed  Judgment Impaired  Confusion None  Danger to Self  Current suicidal ideation? Passive  Self-Injurious Behavior No self-injurious ideation or behavior indicators observed or expressed   Agreement Not to Harm Self Yes  Description of Agreement verbal  Danger to Others  Danger to Others None reported or observed

## 2017-11-07 NOTE — Progress Notes (Signed)
Patient ID: Rachel George, female   DOB: 05/19/1981, 36 y.o.   MRN: [id number]  CSW met with pt to begin discharge planning.  CSW was informed by pt that she plans to return home with her husband to [place], [place].  Pt discussed her increased anxiety related to not being able to work and her husband also not working.  Pt shared that she is concerned that she will be discharged from the [facility] and will not be able to obtain her medications for her Diabetes and other medical conditions.  Pt shared that she would like some assistance with completing an application for [employer] support so she will be able to continue receiving medical care and her medications.  Pt shared that she is legally blind and would need assistance to complete the application.  Pt also shared that she cannot access her pay stubs from her job as she doesn't have a computer and she can only access them via her company's website.  Pt shared that she has tried to receive assistance from her local DSS, but because she cannot work or volunteer they have denied her assistance.  Pt denies current SI/HI.   CSW informed pt that she had tried to contact her husband today in order to discuss ways in which he can support her when she discharges back to home.  Pt shared with CSW that her husband had a job interview today which is why he did not answer his phone earlier. CSW shared that she would gather information for her about the Indigent program with [facility].  CSW will continue to support pt with identifying other resources that pt may need.   CSW also inquired about the provider pt would like her f/u apt made with.  Pt shared that she will go to [facility] for f/u.

## 2017-11-08 LAB — GLUCOSE, CAPILLARY
GLUCOSE-CAPILLARY: 124 mg/dL — AB (ref 65–99)
GLUCOSE-CAPILLARY: 73 mg/dL (ref 65–99)
Glucose-Capillary: 86 mg/dL (ref 65–99)
Glucose-Capillary: 146 mg/dL — ABNORMAL HIGH (ref 65–99)
Glucose-Capillary: 80 mg/dL (ref 65–99)

## 2017-11-08 NOTE — Plan of Care (Signed)
Patient up on the unit she is social with staff and select peers. She denies si, hi, avh.  She is med compliant. Denies any needs. She does still appear sad at times. Safety maintained. Will continue to monitor.

## 2017-11-08 NOTE — [facility] Group Notes (Signed)
11/08/2017 1:15pm  Type of Therapy and Topic: Group Therapy: Holding on to Grudges   Participation Level: Did Not Attend   Description of Group:  In this group patients will be asked to explore and define a grudge. Patients will be guided to discuss their thoughts, feelings, and reasons as to why people have grudges. Patients will process the impact grudges have on daily life and identify thoughts and feelings related to holding grudges. Facilitator will challenge patients to identify ways to let go of grudges and the benefits this provides. Patients will be confronted to address why one struggles letting go of grudges. Lastly, patients will identify feelings and thoughts related to what life would look like without grudges. This group will be process-oriented, with patients participating in exploration of their own experiences, giving and receiving support, and processing challenge from other group members.  Therapeutic Goals:  1. Patient will identify specific grudges related to their personal life.  2. Patient will identify feelings, thoughts, and beliefs around grudges.  3. Patient will identify how one releases grudges appropriately.  4. Patient will identify situations where they could have let go of the grudge, but instead chose to hold on.   Summary of Patient Progress:   Therapeutic Modalities:  Cognitive Behavioral Therapy  Solution Focused Therapy  Motivational Interviewing  Brief Therapy   Serin Thornell  CUEBAS-COLON, LCSW 11/08/2017 12:00 PM

## 2017-11-08 NOTE — Progress Notes (Signed)
[facility] MD Progress Note  11/08/2017 3:04 PM Tylasia Fletchall  MRN:  [id number] Subjective:   Pt reportedly guarded and not interacting with others. States she is " fine" and "whole lot better" than before. Denies AVH. Denies SI/HI. Med compliant, denies side effects.  Principal Problem: Major depressive disorder, single episode, severe without psychosis (HCC) Diagnosis:   Patient Active Problem List   Diagnosis Date Noted  . Major depressive disorder, single episode, severe without psychosis (HCC) [F32.2] 11/04/2017  . Suicidal ideation [R45.851] 11/04/2017  . 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 Uncontrolled type 2 diabetes mellitus with hyperglycemia, with long-term current use of insulin (HCC) [E11.65, Z79.4] 05/01/2017  . Obesity, Class III, BMI 40-49.9 (morbid obesity) (HCC) [E66.01] 05/01/2017  . Morbid obesity with BMI of 50.0-59.9, adult (HCC) [E66.01, Z68.43] 05/01/2017  . Diabetic retinopathy associated with type 2 diabetes mellitus (HCC) [E11.319] 03/18/2017  . S/P amputation [Z89.9] 02/06/2017  . Osteomyelitis (HCC) [M86.9] 12/13/2016  . Diabetic foot ulcer (HCC) [N82.956, L97.509] 10/15/2016  . Cellulitis of toe of right foot [L03.031] 10/07/2016  . Uncontrolled type 2 diabetes mellitus with complication (HCC) [E11.8, E11.65] 09/09/2016  . Morbid obesity (HCC) [E66.01] 09/09/2016  . Diabetic polyneuropathy associated with type 2 diabetes mellitus (HCC) [E11.42] 09/09/2016   Total Time spent with patient: 25 min  Past Psychiatric History: See H&P  Past Medical History:  Past Medical History:  Diagnosis Date  . Cataract   . Diabetes mellitus without complication (HCC)    dx age 89  . Fatty liver disease, nonalcoholic   . Macular degeneration   . Partial blindness     Past Surgical History:  Procedure Laterality Date  . AMPUTATION TOE Right 12/16/2016   Procedure: RIGHT FIRST TOE AMPUTATION;  Surgeon: Ancil Linsey, MD;  Location: [facility];   Service: General;  Laterality: Right;  Right first toe amputation for osteomyelitis  . eye lid transplant    . RETINAL LASER PROCEDURE     Family History:  Family History  Problem Relation Age of Onset  . Diabetes Father    Family Psychiatric  History: See H&P Social History:  Social History   Substance and Sexual Activity  Alcohol Use No     Social History   Substance and Sexual Activity  Drug Use No    Social History   Socioeconomic History  . Marital status: Married    Spouse name: Adalie Mand  . Number of children: None  . Years of education: None  . Highest education level: None  Social Needs  . Financial resource strain: None  . Food insecurity - worry: None  . Food insecurity - inability: None  . Transportation needs - medical: None  . Transportation needs - non-medical: None  Occupational History  . Occupation: CNA personal care giver  Tobacco Use  . Smoking status: Former Smoker    Packs/day: 0.25    Types: Cigarettes    Last attempt to quit: 12/11/2016    Years since quitting: 0.9  . Smokeless tobacco: Never Used  Substance and Sexual Activity  . Alcohol use: No  . Drug use: No  . Sexual activity: Yes    Partners: Male    Birth control/protection: None  Other Topics Concern  . None  Social History Narrative  . None   Additional Social History:                         Sleep: Poor  Appetite:  Good  Current Medications: Current Facility-Administered Medications  Medication Dose Route Frequency Provider Last Rate Last Dose  . alum & mag hydroxide-simeth (MAALOX/MYLANTA) 200-200-20 MG/5ML suspension 30 mL  30 mL Oral Q4H PRN Pucilowska, Jolanta B, MD      . atropine 1 % ophthalmic solution 1 drop  1 drop Right Eye TID McNew, Ileene HutchinsonHolly R, MD   1 drop at 11/08/17 1226  . bacitracin-polymyxin b (POLYSPORIN) ophthalmic ointment 1 application  1 application Right Eye QID Haskell RilingMcNew, Holly R, MD   1 application at 11/08/17 1227  . brimonidine  (ALPHAGAN) 0.2 % ophthalmic solution 1 drop  1 drop Right Eye Q8H McNew, Ileene HutchinsonHolly R, MD   1 drop at 11/08/17 1351  . dorzolamide (TRUSOPT) 2 % ophthalmic solution 1 drop  1 drop Right Eye TID Haskell RilingMcNew, Holly R, MD   1 drop at 11/08/17 1227  . escitalopram (LEXAPRO) tablet 10 mg  10 mg Oral Daily McNew, Ileene HutchinsonHolly R, MD   10 mg at 11/08/17 0816  . gabapentin (NEURONTIN) tablet 300 mg  300 mg Oral BID Beverly SessionsSubedi, Travante Knee, MD   300 mg at 11/08/17 0816  . ibuprofen (ADVIL,MOTRIN) tablet 400 mg  400 mg Oral Q4H PRN Haskell RilingMcNew, Holly R, MD   400 mg at 11/08/17 1244  . insulin aspart (novoLOG) injection 0-15 Units  0-15 Units Subcutaneous TID WC Pucilowska, Jolanta B, MD   2 Units at 11/08/17 0817  . insulin aspart (novoLOG) injection 0-5 Units  0-5 Units Subcutaneous QHS Pucilowska, Jolanta B, MD      . insulin aspart (novoLOG) injection 12 Units  12 Units Subcutaneous TID WC Pucilowska, Jolanta B, MD   12 Units at 11/08/17 0817  . insulin detemir (LEVEMIR) injection 50 Units  50 Units Subcutaneous QHS Pucilowska, Jolanta B, MD   50 Units at 11/07/17 2131  . loperamide (IMODIUM) capsule 2 mg  2 mg Oral PRN Haskell RilingMcNew, Holly R, MD   2 mg at 11/05/17 1547  . magnesium hydroxide (MILK OF MAGNESIA) suspension 30 mL  30 mL Oral Daily PRN Pucilowska, Jolanta B, MD      . metFORMIN (GLUCOPHAGE) tablet 500 mg  500 mg Oral BID WC Pucilowska, Jolanta B, MD   500 mg at 11/08/17 0816  . pravastatin (PRAVACHOL) tablet 20 mg  20 mg Oral q1800 Pucilowska, Jolanta B, MD   20 mg at 11/07/17 1706  . protein supplement (PREMIER PROTEIN) liquid  11 oz Oral BID BM McNew, Ileene HutchinsonHolly R, MD   11 oz at 11/08/17 1352  . timolol (TIMOPTIC) 0.5 % ophthalmic solution 1 drop  1 drop Right Eye BID Haskell RilingMcNew, Holly R, MD   1 drop at 11/08/17 0819  . traZODone (DESYREL) tablet 100 mg  100 mg Oral QHS PRN McNew, Ileene HutchinsonHolly R, MD   100 mg at 11/07/17 2131    Lab Results:  Results for orders placed or performed during the hospital encounter of 11/04/17 (from the past 48  hour(s))  Glucose, capillary     Status: None   Collection Time: 11/06/17  4:27 PM  Result Value Ref Range   Glucose-Capillary 86 65 - 99 mg/dL  Glucose, capillary     Status: Abnormal   Collection Time: 11/06/17  8:15 PM  Result Value Ref Range   Glucose-Capillary 141 (H) 65 - 99 mg/dL   Comment 1 Notify RN   Glucose, capillary     Status: Abnormal   Collection Time: 11/07/17  7:05 AM  Result Value Ref Range   Glucose-Capillary  141 (H) 65 - 99 mg/dL   Comment 1 Notify RN   Glucose, capillary     Status: Abnormal   Collection Time: 11/07/17 11:33 AM  Result Value Ref Range   Glucose-Capillary 125 (H) 65 - 99 mg/dL   Comment 1 Notify RN   Glucose, capillary     Status: None   Collection Time: 11/07/17  4:18 PM  Result Value Ref Range   Glucose-Capillary 86 65 - 99 mg/dL  Glucose, capillary     Status: None   Collection Time: 11/07/17  8:04 PM  Result Value Ref Range   Glucose-Capillary 95 65 - 99 mg/dL   Comment 1 Notify RN   Glucose, capillary     Status: Abnormal   Collection Time: 11/08/17  6:55 AM  Result Value Ref Range   Glucose-Capillary 124 (H) 65 - 99 mg/dL  Glucose, capillary     Status: None   Collection Time: 11/08/17 11:47 AM  Result Value Ref Range   Glucose-Capillary 86 65 - 99 mg/dL    Blood Alcohol level:  Lab Results  Component Value Date   ETH <10 11/04/2017    Metabolic Disorder Labs: Lab Results  Component Value Date   HGBA1C 8.5 (H) 11/05/2017   MPG 197.25 11/05/2017   MPG 220.21 10/13/2017   No results found for: PROLACTIN Lab Results  Component Value Date   CHOL 156 11/05/2017   TRIG 121 11/05/2017   HDL 44 11/05/2017   CHOLHDL 3.5 11/05/2017   VLDL 24 11/05/2017   LDLCALC 88 11/05/2017   LDLCALC 92 07/30/2017    Physical Findings: AIMS: Facial and Oral Movements Muscles of Facial Expression: None, normal Lips and Perioral Area: None, normal Jaw: None, normal Tongue: None, normal,Extremity Movements Upper (arms, wrists,  hands, fingers): None, normal Lower (legs, knees, ankles, toes): None, normal, Trunk Movements Neck, shoulders, hips: None, normal, Overall Severity Severity of abnormal movements (highest score from questions above): None, normal Incapacitation due to abnormal movements: None, normal Patient's awareness of abnormal movements (rate only patient's report): No Awareness, Dental Status Current problems with teeth and/or dentures?: No Does patient usually wear dentures?: No  CIWA:    COWS:     Musculoskeletal: Strength & Muscle Tone: within normal limits Gait & Station: normal Patient leans: N/A  Psychiatric Specialty Exam: Physical Exam  Nursing note and vitals reviewed.   Review of Systems  All other systems reviewed and are negative.   Blood pressure 115/71, pulse 79, temperature 98.8 F (37.1 C), temperature source Oral, resp. rate 18, height 5\' 7"  (1.702 m), weight (!) 158.3 kg (349 lb), last menstrual period 11/03/2017.Body mass index is 54.66 kg/m.  General Appearance: Casual  Eye Contact:  Good  Speech:  Clear and Coherent  Volume:  Normal  Mood:  Fine"  Affect:  Congruent  Thought Process:  Coherent and Goal Directed  Orientation:  Full (Time, Place, and Person)  Thought Content:  Negative  Suicidal Thoughts:  No  Homicidal Thoughts:  No  Memory:  Immediate;   Fair  Judgement:  Fair  Insight:  Fair  Psychomotor Activity:  Normal  Concentration:  Concentration: Fair  Recall:  [employer] of Knowledge:  Fair  Language:  Fair  Akathisia:  No      Assets:  Communication Skills Desire for Improvement  ADL's:  Intact  Cognition:  WNL  Sleep:  Number of Hours: 7     Treatment Plan Summary: 36 yo female admitted for SI and HI. Mood  improving. She is trying to think more positively. She has good insight into herself and things she needs to do to feel better.  Plan:  MDD -Continue Lexapro 10 mg daily  Anxiety -Gabapentin 300 mg BID. This is also for  neuropathy  Type I diabetes -DM care coordinator on board -LEvimir 50 units qhs -Novolog 12 units TID -sliding scale -Metformin  HLD -Pravastatin  Macular Degeneration -home eye drops  Dispo/Social -Pt will return home with her husband on discharge.    Beverly SessionsJagannath Guss Farruggia, MD 11/08/2017, 3:04 PMPatient ID: Dayna BarkerKaren Lowy, female   DOB: 10-Oct-1981, 36 y.o.   MRN: [id number]

## 2017-11-08 NOTE — Plan of Care (Signed)
Pt. Reports feeling, "better" today then she did yesterday. Pt. Able to contract for safety and remain safe on the unit. Pt. Participates in unit activities and procedures this evening. Pt verbalizes education understandings. Pt reports sleeping well. Pt compliant with all of her medications this evening, besides insulin. Pt participates in plan of care.

## 2017-11-09 DIAGNOSIS — F322 Major depressive disorder, single episode, severe without psychotic features: Principal | ICD-10-CM

## 2017-11-09 LAB — GLUCOSE, CAPILLARY
GLUCOSE-CAPILLARY: 123 mg/dL — AB (ref 65–99)
GLUCOSE-CAPILLARY: 189 mg/dL — AB (ref 65–99)
GLUCOSE-CAPILLARY: 94 mg/dL (ref 65–99)
Glucose-Capillary: 147 mg/dL — ABNORMAL HIGH (ref 65–99)

## 2017-11-09 NOTE — Progress Notes (Signed)
Nutrition Education Note  RD consulted for nutrition education regarding diabetes.   Lab Results  Component Value Date   HGBA1C 8.5 (H) 11/05/2017   Met with patient in dining room. She reports she had requested to speak with RD because she feels like she is not getting enough to eat on the heart healthy/carbohydrate modified diet and she is worried her CBGs will go too low as she is receiving a lot of insulin. She reports at home her CBGs are typically 180-200 but here they have been regularly under 100 (of note, do not see any values <65). Patient is enjoying the [employer]. She would also like two snacks between meals. RD has updated order for snacks at 2pm and 8pm.  RD provided "Carbohydrate Counting for People with Diabetes" handout from the Academy of Nutrition and Dietetics. Discussed different food groups and their effects on blood sugar, emphasizing carbohydrate-containing foods. Provided list of carbohydrates and recommended serving sizes of common foods.  RD provided "Using Nutrition Labels: Carbohydrates" handout from the Academy of Nutrition and Dietetics. Discussed how to read a nutrition label including looking at serving size and servings per container. Reviewed that there are 15 grams of carbohydrate in one carbohydrate choice. Encouraged patient to use chart for range of carbohydrate grams per choice when reading nutrition labels.  Discussed importance of controlled and consistent carbohydrate intake throughout the day. Provided examples of ways to balance meals/snacks and encouraged intake of high-fiber, whole grain complex carbohydrates. Teach back method used.  Expect fair compliance.  Body mass index is 54.66 kg/m. Pt meets criteria for Obesity Class III based on current BMI.  Current diet order is Heart Healthy/Carbohydrate Modified, patient is consuming approximately 100% of meals at this time. Labs and medications reviewed. No further nutrition interventions  warranted at this time. RD contact information provided. If additional nutrition issues arise, please re-consult RD.  Willey Blade, MS, [place], LDN Office: [phone] Pager: [phone] After Hours/Weekend Pager: [phone]

## 2017-11-09 NOTE — Progress Notes (Signed)
D:Pt denies SI/HI/AVH. Pt verbally contracts for safety. Pt reports no complaints this evening and also states she can remain safe while on the unit. Pt reports feeling, "better" today then she did yesterday. Pt reports sleeping well. Pt upon interaction is cooperative with interview and presents with a slight flat expression.   A: Q x 15 minute observation checks were completed for safety. Patient was provided with education. Patient was given scheduled medications, but refused her night dose of Levemir, because, "It will make me get too low".Patient  was encourage to attend groups, participate in unit activities and continue with plan of care.   R:Patient is complaint with unit procedures and was frequently seen in the milieu and hallways socializing with peers. Pt nutritional reports and pt report good intake.             Patient slept for Estimated Hours of 8; Precautionary checks every 15 minutes for safety maintained, room free of safety hazards, patient sustains no injury or falls during this shift.

## 2017-11-09 NOTE — [facility] Group Notes (Signed)
[facility] Group Notes:  (Nursing/MHT/Case Management/Adjunct)  Date:  11/09/2017  Time:  1:24 AM  Type of Therapy:  Psychoeducational Skills  Participation Level:  Did Not Attend    Summary of Progress/Problems:  Rachel MilroyLaquanda Y Dafna George 11/09/2017, 1:24 AM

## 2017-11-09 NOTE — Progress Notes (Signed)
[facility] MD Progress Note  11/09/2017 12:41 PM Rachel George  MRN:  [id number] Subjective:   Pt irritable and upset states her diet is not right, nutrition consult requested. Reports her mood is improving, denies SI/HI.   Med compliant, denies side effects.  Principal Problem: Major depressive disorder, single episode, severe without psychosis (HCC) Diagnosis:   Patient Active Problem List   Diagnosis Date Noted  . Major depressive disorder, single episode, severe without psychosis (HCC) [F32.2] 11/04/2017  . Suicidal ideation [R45.851] 11/04/2017  . 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 Uncontrolled type 2 diabetes mellitus with hyperglycemia, with long-term current use of insulin (HCC) [E11.65, Z79.4] 05/01/2017  . Obesity, Class III, BMI 40-49.9 (morbid obesity) (HCC) [E66.01] 05/01/2017  . Morbid obesity with BMI of 50.0-59.9, adult (HCC) [E66.01, Z68.43] 05/01/2017  . Diabetic retinopathy associated with type 2 diabetes mellitus (HCC) [E11.319] 03/18/2017  . S/P amputation [Z89.9] 02/06/2017  . Osteomyelitis (HCC) [M86.9] 12/13/2016  . Diabetic foot ulcer (HCC) [W09.811, L97.509] 10/15/2016  . Cellulitis of toe of right foot [L03.031] 10/07/2016  . Uncontrolled type 2 diabetes mellitus with complication (HCC) [E11.8, E11.65] 09/09/2016  . Morbid obesity (HCC) [E66.01] 09/09/2016  . Diabetic polyneuropathy associated with type 2 diabetes mellitus (HCC) [E11.42] 09/09/2016   Total Time spent with patient: 25 min  Past Psychiatric History: See H&P  Past Medical History:  Past Medical History:  Diagnosis Date  . Cataract   . Diabetes mellitus without complication (HCC)    dx age 60  . Fatty liver disease, nonalcoholic   . Macular degeneration   . Partial blindness     Past Surgical History:  Procedure Laterality Date  . AMPUTATION TOE Right 12/16/2016   Procedure: RIGHT FIRST TOE AMPUTATION;  Surgeon: Ancil Linsey, MD;  Location: [facility];  Service:  General;  Laterality: Right;  Right first toe amputation for osteomyelitis  . eye lid transplant    . RETINAL LASER PROCEDURE     Family History:  Family History  Problem Relation Age of Onset  . Diabetes Father    Family Psychiatric  History: See H&P Social History:  Social History   Substance and Sexual Activity  Alcohol Use No     Social History   Substance and Sexual Activity  Drug Use No    Social History   Socioeconomic History  . Marital status: Married    Spouse name: Hannalee Castor  . Number of children: None  . Years of education: None  . Highest education level: None  Social Needs  . Financial resource strain: None  . Food insecurity - worry: None  . Food insecurity - inability: None  . Transportation needs - medical: None  . Transportation needs - non-medical: None  Occupational History  . Occupation: CNA personal care giver  Tobacco Use  . Smoking status: Former Smoker    Packs/day: 0.25    Types: Cigarettes    Last attempt to quit: 12/11/2016    Years since quitting: 0.9  . Smokeless tobacco: Never Used  Substance and Sexual Activity  . Alcohol use: No  . Drug use: No  . Sexual activity: Yes    Partners: Male    Birth control/protection: None  Other Topics Concern  . None  Social History Narrative  . None   Additional Social History:                         Sleep: Poor  Appetite:  Good  Current Medications: Current Facility-Administered Medications  Medication Dose Route Frequency Provider Last Rate Last Dose  . alum & mag hydroxide-simeth (MAALOX/MYLANTA) 200-200-20 MG/5ML suspension 30 mL  30 mL Oral Q4H PRN Pucilowska, Jolanta B, MD      . atropine 1 % ophthalmic solution 1 drop  1 drop Right Eye TID McNew, Ileene HutchinsonHolly R, MD   1 drop at 11/09/17 1239  . bacitracin-polymyxin b (POLYSPORIN) ophthalmic ointment 1 application  1 application Right Eye QID McNew, Ileene HutchinsonHolly R, MD   1 application at 11/09/17 1239  . brimonidine (ALPHAGAN)  0.2 % ophthalmic solution 1 drop  1 drop Right Eye Q8H McNew, Ileene HutchinsonHolly R, MD   1 drop at 11/09/17 [phone]  . dorzolamide (TRUSOPT) 2 % ophthalmic solution 1 drop  1 drop Right Eye TID Haskell RilingMcNew, Holly R, MD   1 drop at 11/09/17 1239  . escitalopram (LEXAPRO) tablet 10 mg  10 mg Oral Daily McNew, Ileene HutchinsonHolly R, MD   10 mg at 11/09/17 0842  . gabapentin (NEURONTIN) tablet 300 mg  300 mg Oral BID Beverly SessionsSubedi, Jerett Odonohue, MD   300 mg at 11/09/17 0843  . ibuprofen (ADVIL,MOTRIN) tablet 400 mg  400 mg Oral Q4H PRN Haskell RilingMcNew, Holly R, MD   400 mg at 11/09/17 0843  . insulin aspart (novoLOG) injection 0-15 Units  0-15 Units Subcutaneous TID WC Pucilowska, Jolanta B, MD   3 Units at 11/09/17 1239  . insulin aspart (novoLOG) injection 0-5 Units  0-5 Units Subcutaneous QHS Pucilowska, Jolanta B, MD      . insulin aspart (novoLOG) injection 12 Units  12 Units Subcutaneous TID WC Pucilowska, Jolanta B, MD   12 Units at 11/09/17 1240  . insulin detemir (LEVEMIR) injection 50 Units  50 Units Subcutaneous QHS Pucilowska, Jolanta B, MD   50 Units at 11/07/17 2131  . loperamide (IMODIUM) capsule 2 mg  2 mg Oral PRN Haskell RilingMcNew, Holly R, MD   2 mg at 11/05/17 1547  . magnesium hydroxide (MILK OF MAGNESIA) suspension 30 mL  30 mL Oral Daily PRN Pucilowska, Jolanta B, MD      . metFORMIN (GLUCOPHAGE) tablet 500 mg  500 mg Oral BID WC Pucilowska, Jolanta B, MD   500 mg at 11/09/17 0843  . pravastatin (PRAVACHOL) tablet 20 mg  20 mg Oral q1800 Pucilowska, Jolanta B, MD   20 mg at 11/08/17 1709  . protein supplement (PREMIER PROTEIN) liquid  11 oz Oral BID BM McNew, Ileene HutchinsonHolly R, MD   11 oz at 11/08/17 1352  . timolol (TIMOPTIC) 0.5 % ophthalmic solution 1 drop  1 drop Right Eye BID Haskell RilingMcNew, Holly R, MD   1 drop at 11/09/17 0843  . traZODone (DESYREL) tablet 100 mg  100 mg Oral QHS PRN McNew, Ileene HutchinsonHolly R, MD   100 mg at 11/08/17 2119    Lab Results:  Results for orders placed or performed during the hospital encounter of 11/04/17 (from the past 48 hour(s))   Glucose, capillary     Status: None   Collection Time: 11/07/17  4:18 PM  Result Value Ref Range   Glucose-Capillary 86 65 - 99 mg/dL  Glucose, capillary     Status: None   Collection Time: 11/07/17  8:04 PM  Result Value Ref Range   Glucose-Capillary 95 65 - 99 mg/dL   Comment 1 Notify RN   Glucose, capillary     Status: Abnormal   Collection Time: 11/08/17  6:55 AM  Result Value Ref Range   Glucose-Capillary 124 (  H) 65 - 99 mg/dL  Glucose, capillary     Status: None   Collection Time: 11/08/17 11:47 AM  Result Value Ref Range   Glucose-Capillary 86 65 - 99 mg/dL  Glucose, capillary     Status: Abnormal   Collection Time: 11/08/17  4:26 PM  Result Value Ref Range   Glucose-Capillary 146 (H) 65 - 99 mg/dL   Comment 1 Document in Chart   Glucose, capillary     Status: None   Collection Time: 11/08/17  9:11 PM  Result Value Ref Range   Glucose-Capillary 73 65 - 99 mg/dL  Glucose, capillary     Status: None   Collection Time: 11/08/17  9:12 PM  Result Value Ref Range   Glucose-Capillary 80 65 - 99 mg/dL  Glucose, capillary     Status: Abnormal   Collection Time: 11/09/17  6:57 AM  Result Value Ref Range   Glucose-Capillary 123 (H) 65 - 99 mg/dL  Glucose, capillary     Status: Abnormal   Collection Time: 11/09/17 11:19 AM  Result Value Ref Range   Glucose-Capillary 189 (H) 65 - 99 mg/dL   Comment 1 Notify RN     Blood Alcohol level:  Lab Results  Component Value Date   ETH <10 11/04/2017    Metabolic Disorder Labs: Lab Results  Component Value Date   HGBA1C 8.5 (H) 11/05/2017   MPG 197.25 11/05/2017   MPG 220.21 10/13/2017   No results found for: PROLACTIN Lab Results  Component Value Date   CHOL 156 11/05/2017   TRIG 121 11/05/2017   HDL 44 11/05/2017   CHOLHDL 3.5 11/05/2017   VLDL 24 11/05/2017   LDLCALC 88 11/05/2017   LDLCALC 92 07/30/2017    Physical Findings: AIMS: Facial and Oral Movements Muscles of Facial Expression: None, normal Lips and  Perioral Area: None, normal Jaw: None, normal Tongue: None, normal,Extremity Movements Upper (arms, wrists, hands, fingers): None, normal Lower (legs, knees, ankles, toes): None, normal, Trunk Movements Neck, shoulders, hips: None, normal, Overall Severity Severity of abnormal movements (highest score from questions above): None, normal Incapacitation due to abnormal movements: None, normal Patient's awareness of abnormal movements (rate only patient's report): No Awareness, Dental Status Current problems with teeth and/or dentures?: No Does patient usually wear dentures?: No  CIWA:    COWS:     Musculoskeletal: Strength & Muscle Tone: within normal limits Gait & Station: normal Patient leans: N/A  Psychiatric Specialty Exam: Physical Exam  Nursing note and vitals reviewed.   Review of Systems  All other systems reviewed and are negative.   Blood pressure 123/72, pulse 82, temperature 98.4 F (36.9 C), temperature source Oral, resp. rate 18, height 5\' 7"  (1.702 m), weight (!) 158.3 kg (349 lb), last menstrual period 11/03/2017.Body mass index is 54.66 kg/m.  General Appearance: Casual  Eye Contact:  Good  Speech:  Clear and Coherent  Volume:  Normal  Mood:  anxious  Affect:  anxious  Thought Process:  Coherent and Goal Directed  Orientation:  Full (Time, Place, and Person)  Thought Content:  Preoccupied with diet  Suicidal Thoughts:  No  Homicidal Thoughts:  No  Memory:  Immediate;   Fair  Judgement:  Fair  Insight:  Fair  Psychomotor Activity:  Normal  Concentration:  Concentration: Fair  Recall:  [employer]Fair  Fund of Knowledge:  Fair  Language:  Fair  Akathisia:  No      Assets:  Communication Skills Desire for Improvement  ADL's:  Intact  Cognition:  WNL  Sleep:  Number of Hours: 8     Treatment Plan Summary: 36 yo female admitted for SI and HI. Mood improving. She is trying to think more positively. She has good insight into herself and things she needs to do  to feel better.   Plan: Nutrition consult for diet concerns. MDD -Continue Lexapro 10 mg daily  Anxiety -Gabapentin 300 mg BID. This is also for neuropathy  Type I diabetes -DM care coordinator on board -LEvimir 50 units qhs -Novolog 12 units TID -sliding scale -Metformin  HLD -Pravastatin  Macular Degeneration -home eye drops  Dispo/Social -Pt will return home with her husband on discharge.    Beverly Sessions, MD 11/09/2017, 12:41 PMPatient ID: Rachel George, female   DOB: February 03, 1981, 36 y.o.   MRN: [id number] Patient ID: Rachel George, female   DOB: [birth year], 36 y.o.   MRN: [id number]

## 2017-11-10 LAB — GLUCOSE, CAPILLARY
GLUCOSE-CAPILLARY: 198 mg/dL — AB (ref 65–99)
GLUCOSE-CAPILLARY: 100 mg/dL — AB (ref 65–99)
Glucose-Capillary: 120 mg/dL — ABNORMAL HIGH (ref 65–99)
Glucose-Capillary: 169 mg/dL — ABNORMAL HIGH (ref 65–99)
Glucose-Capillary: 142 mg/dL — ABNORMAL HIGH (ref 65–99)

## 2017-11-10 MED ORDER — LORAZEPAM 1 MG PO TABS
1.0000 mg | ORAL_TABLET | Freq: Four times a day (QID) | ORAL | Status: AC | PRN
  Administered 2017-11-10 (×2): 1 mg via ORAL
  Filled 2017-11-10 (×2): qty 1

## 2017-11-10 NOTE — Progress Notes (Addendum)
[facility] MD Progress Note  11/10/2017 3:01 PM Rachel George  MRN:  [id number] Subjective:  Rachel George reports feeling better in terms of her depression but has been extremely worried today about her vision. Since this morning, she sees flashes in her left, better, eye and worries about retina detachment. She did have similar symptoms in the past. I left a message for Dr. Brooke Dare, ophthalmology on call at [phone] but have not heard back from him.   Treatment plan. There were no medication adjustments today.   Social/disposition. She will be diacharged to home. She will follow up with [facility].  Principal Problem: Major depressive disorder, single episode, severe without psychosis (HCC) Diagnosis:   Patient Active Problem List   Diagnosis Date Noted  . Major depressive disorder, single episode, severe without psychosis (HCC) [F32.2] 11/04/2017    Priority: High  . Suicidal ideation [R45.851] 11/04/2017  . 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 Uncontrolled type 2 diabetes mellitus with hyperglycemia, with long-term current use of insulin (HCC) [E11.65, Z79.4] 05/01/2017  . Obesity, Class III, BMI 40-49.9 (morbid obesity) (HCC) [E66.01] 05/01/2017  . Morbid obesity with BMI of 50.0-59.9, adult (HCC) [E66.01, Z68.43] 05/01/2017  . Diabetic retinopathy associated with type 2 diabetes mellitus (HCC) [E11.319] 03/18/2017  . S/P amputation [Z89.9] 02/06/2017  . Osteomyelitis (HCC) [M86.9] 12/13/2016  . Diabetic foot ulcer (HCC) [W29.562, L97.509] 10/15/2016  . Cellulitis of toe of right foot [L03.031] 10/07/2016  . Uncontrolled type 2 diabetes mellitus with complication (HCC) [E11.8, E11.65] 09/09/2016  . Morbid obesity (HCC) [E66.01] 09/09/2016  . Diabetic polyneuropathy associated with type 2 diabetes mellitus (HCC) [E11.42] 09/09/2016   Total Time spent with patient: 25 min  Past Psychiatric History: depression  Past Medical History:  Past Medical History:   Diagnosis Date  . Cataract   . Diabetes mellitus without complication (HCC)    dx age 36  . Fatty liver disease, nonalcoholic   . Macular degeneration   . Partial blindness     Past Surgical History:  Procedure Laterality Date  . AMPUTATION TOE Right 12/16/2016   Procedure: RIGHT FIRST TOE AMPUTATION;  Surgeon: Ancil Linsey, MD;  Location: [facility];  Service: General;  Laterality: Right;  Right first toe amputation for osteomyelitis  . eye lid transplant    . RETINAL LASER PROCEDURE     Family History:  Family History  Problem Relation Age of Onset  . Diabetes Father    Family Psychiatric  History: brother with brain tumor has "issues" Social History:  Social History   Substance and Sexual Activity  Alcohol Use No     Social History   Substance and Sexual Activity  Drug Use No    Social History   Socioeconomic History  . Marital status: Married    Spouse name: Rachel George  . Number of children: None  . Years of education: None  . Highest education level: None  Social Needs  . Financial resource strain: None  . Food insecurity - worry: None  . Food insecurity - inability: None  . Transportation needs - medical: None  . Transportation needs - non-medical: None  Occupational History  . Occupation: CNA personal care giver  Tobacco Use  . Smoking status: Former Smoker    Packs/day: 0.25    Types: Cigarettes    Last attempt to quit: 12/11/2016    Years since quitting: 0.9  . Smokeless tobacco: Never Used  Substance and Sexual Activity  . Alcohol use: No  .  Drug use: No  . Sexual activity: Yes    Partners: Male    Birth control/protection: None  Other Topics Concern  . None  Social History Narrative  . None   Additional Social History:                         Sleep: Fair  Appetite:  Fair  Current Medications: Current Facility-Administered Medications  Medication Dose Route Frequency Provider Last Rate Last Dose  . alum & mag  hydroxide-simeth (MAALOX/MYLANTA) 200-200-20 MG/5ML suspension 30 mL  30 mL Oral Q4H PRN Pucilowska, Jolanta B, MD      . atropine 1 % ophthalmic solution 1 drop  1 drop Right Eye TID Haskell RilingMcNew, Holly R, MD   1 drop at 11/10/17 0749  . bacitracin-polymyxin b (POLYSPORIN) ophthalmic ointment 1 application  1 application Right Eye QID Haskell RilingMcNew, Holly R, MD   1 application at 11/10/17 [id number]  . brimonidine (ALPHAGAN) 0.2 % ophthalmic solution 1 drop  1 drop Right Eye Q8H McNew, Holly R, MD   1 drop at 11/10/17 1350  . dorzolamide (TRUSOPT) 2 % ophthalmic solution 1 drop  1 drop Right Eye TID Haskell RilingMcNew, Holly R, MD   1 drop at 11/10/17 0749  . escitalopram (LEXAPRO) tablet 10 mg  10 mg Oral Daily McNew, Ileene HutchinsonHolly R, MD   10 mg at 11/10/17 0748  . gabapentin (NEURONTIN) tablet 300 mg  300 mg Oral BID Beverly SessionsSubedi, Jagannath, MD   300 mg at 11/10/17 0748  . ibuprofen (ADVIL,MOTRIN) tablet 400 mg  400 mg Oral Q4H PRN Haskell RilingMcNew, Holly R, MD   400 mg at 11/10/17 0841  . insulin aspart (novoLOG) injection 0-15 Units  0-15 Units Subcutaneous TID WC Pucilowska, Jolanta B, MD   3 Units at 11/09/17 1239  . insulin aspart (novoLOG) injection 0-5 Units  0-5 Units Subcutaneous QHS Pucilowska, Jolanta B, MD      . insulin aspart (novoLOG) injection 12 Units  12 Units Subcutaneous TID WC Pucilowska, Jolanta B, MD   12 Units at 11/10/17 0749  . insulin detemir (LEVEMIR) injection 50 Units  50 Units Subcutaneous QHS Pucilowska, Jolanta B, MD   50 Units at 11/09/17 2150  . loperamide (IMODIUM) capsule 2 mg  2 mg Oral PRN Haskell RilingMcNew, Holly R, MD   2 mg at 11/05/17 1547  . magnesium hydroxide (MILK OF MAGNESIA) suspension 30 mL  30 mL Oral Daily PRN Pucilowska, Jolanta B, MD      . metFORMIN (GLUCOPHAGE) tablet 500 mg  500 mg Oral BID WC Pucilowska, Jolanta B, MD   500 mg at 11/10/17 0809  . pravastatin (PRAVACHOL) tablet 20 mg  20 mg Oral q1800 Pucilowska, Jolanta B, MD   20 mg at 11/09/17 1606  . protein supplement (PREMIER PROTEIN) liquid  11 oz Oral  BID BM McNew, Ileene HutchinsonHolly R, MD   11 oz at 11/10/17 1038  . timolol (TIMOPTIC) 0.5 % ophthalmic solution 1 drop  1 drop Right Eye BID Haskell RilingMcNew, Holly R, MD   1 drop at 11/10/17 0749  . traZODone (DESYREL) tablet 100 mg  100 mg Oral QHS PRN McNew, Ileene HutchinsonHolly R, MD   100 mg at 11/09/17 2147    Lab Results:  Results for orders placed or performed during the hospital encounter of 11/04/17 (from the past 48 hour(s))  Glucose, capillary     Status: Abnormal   Collection Time: 11/08/17  4:26 PM  Result Value Ref Range   Glucose-Capillary 146 (  H) 65 - 99 mg/dL   Comment 1 Document in Chart   Glucose, capillary     Status: None   Collection Time: 11/08/17  9:11 PM  Result Value Ref Range   Glucose-Capillary 73 65 - 99 mg/dL  Glucose, capillary     Status: None   Collection Time: 11/08/17  9:12 PM  Result Value Ref Range   Glucose-Capillary 80 65 - 99 mg/dL  Glucose, capillary     Status: Abnormal   Collection Time: 11/09/17  6:57 AM  Result Value Ref Range   Glucose-Capillary 123 (H) 65 - 99 mg/dL  Glucose, capillary     Status: Abnormal   Collection Time: 11/09/17 11:19 AM  Result Value Ref Range   Glucose-Capillary 189 (H) 65 - 99 mg/dL   Comment 1 Notify RN   Glucose, capillary     Status: None   Collection Time: 11/09/17  4:10 PM  Result Value Ref Range   Glucose-Capillary 94 65 - 99 mg/dL  Glucose, capillary     Status: Abnormal   Collection Time: 11/09/17  8:20 PM  Result Value Ref Range   Glucose-Capillary 147 (H) 65 - 99 mg/dL   Comment 1 Notify RN   Glucose, capillary     Status: Abnormal   Collection Time: 11/10/17  7:14 AM  Result Value Ref Range   Glucose-Capillary 120 (H) 65 - 99 mg/dL   Comment 1 Notify RN   Glucose, capillary     Status: Abnormal   Collection Time: 11/10/17  9:37 AM  Result Value Ref Range   Glucose-Capillary 198 (H) 65 - 99 mg/dL  Glucose, capillary     Status: Abnormal   Collection Time: 11/10/17  2:44 PM  Result Value Ref Range   Glucose-Capillary 169 (H)  65 - 99 mg/dL    Blood Alcohol level:  Lab Results  Component Value Date   ETH <10 11/04/2017    Metabolic Disorder Labs: Lab Results  Component Value Date   HGBA1C 8.5 (H) 11/05/2017   MPG 197.25 11/05/2017   MPG 220.21 10/13/2017   No results found for: PROLACTIN Lab Results  Component Value Date   CHOL 156 11/05/2017   TRIG 121 11/05/2017   HDL 44 11/05/2017   CHOLHDL 3.5 11/05/2017   VLDL 24 11/05/2017   LDLCALC 88 11/05/2017   LDLCALC 92 07/30/2017    Physical Findings: AIMS: Facial and Oral Movements Muscles of Facial Expression: None, normal Lips and Perioral Area: None, normal Jaw: None, normal Tongue: None, normal,Extremity Movements Upper (arms, wrists, hands, fingers): None, normal Lower (legs, knees, ankles, toes): None, normal, Trunk Movements Neck, shoulders, hips: None, normal, Overall Severity Severity of abnormal movements (highest score from questions above): None, normal Incapacitation due to abnormal movements: None, normal Patient's awareness of abnormal movements (rate only patient's report): No Awareness, Dental Status Current problems with teeth and/or dentures?: No Does patient usually wear dentures?: No  CIWA:    COWS:     Musculoskeletal: Strength & Muscle Tone: within normal limits Gait & Station: normal Patient leans: N/A  Psychiatric Specialty Exam: Physical Exam  Nursing note and vitals reviewed. Psychiatric: Her speech is normal and behavior is normal. Judgment and thought content normal. Her affect is blunt. Cognition and memory are normal.    Review of Systems  Eyes: Positive for blurred vision.  Neurological: Positive for dizziness.  Psychiatric/Behavioral: Positive for depression. The patient is nervous/anxious.   All other systems reviewed and are negative.   Blood pressure 123/72,  pulse 82, temperature 98.4 F (36.9 C), temperature source Oral, resp. rate 18, height 5\' 7"  (1.702 m), weight (!) 158.3 kg (349 lb), last  menstrual period 11/03/2017.Body mass index is 54.66 kg/m.  General Appearance: Casual  Eye Contact:  Good  Speech:  Clear and Coherent  Volume:  Normal  Mood:  euthymic  Affect:  anxious  Thought Process:  Goal Directed and Descriptions of Associations: Intact  Orientation:  Full (Time, Place, and Person)  Thought Content: preoccupied with vision  Suicidal Thoughts:  No  Homicidal Thoughts:  No  Memory:  Immediate;   Fair Recent;   Fair Remote;   Fair  Judgement:  Impaired  Insight:  Present  Psychomotor Activity:  Normal  Concentration:  Concentration: Fair and Attention Span: Fair  Recall:  [employer] of Knowledge:  Fair  Language:  Fair  Akathisia:  No      Assets:  Communication Skills Desire for Improvement Resilience Social Support  ADL's:  Intact  Cognition:  WNL  Sleep:  Number of Hours: 8     Treatment Plan Summary: 36 yo female admitted for SI and HI. Mood improving. She is trying to think more positively. She has good insight into herself and things she needs to do to feel better.Preoccupied with vision disturbances. Ophthalmology consult was odrered to rule out retinal detachment.   Plan: Nutrition consult for diet concerns. MDD -Continue Lexapro 10 mg daily  Anxiety -Gabapentin 300 mg BID. This is also for neuropathy  Type I diabetes -DM care coordinator on board -LEvimir 50 units qhs -Novolog 12 units TID -sliding scale -Metformin  HLD -Pravastatin  Macular Degeneration -home eye drops  Dispo/Social -Pt will return home with her husband on discharge.    Kristine Linea, MD 11/10/2017, 3:01 PM

## 2017-11-10 NOTE — [facility] Group Notes (Signed)
11/10/2017 1:00PM   Type of Therapy and Topic:  Group Therapy:  Overcoming Obstacles   Participation Level:  Did Not Attend   Description of Group:   In this group patients will be encouraged to explore what they see as obstacles to their own wellness and recovery. They will be guided to discuss their thoughts, feelings, and behaviors related to these obstacles. The group will process together ways to cope with barriers, with attention given to specific choices patients can make. Each patient will be challenged to identify changes they are motivated to make in order to overcome their obstacles. This group will be process-oriented, with patients participating in exploration of their own experiences, giving and receiving support, and processing challenge from other group members.   Therapeutic Goals: 1. Patient will identify personal and current obstacles as they relate to admission. 2. Patient will identify barriers that currently interfere with their wellness or overcoming obstacles.  3. Patient will identify feelings, thought process and behaviors related to these barriers. 4. Patient will identify two changes they are willing to make to overcome these obstacles:      Summary of Patient Progress Pt was invited to attend group but chose not to attend. CSW will continue to encourage pt to attend group throughout their admission.    Therapeutic Modalities:   Cognitive Behavioral Therapy Solution Focused Therapy Motivational Interviewing Relapse Prevention Therapy  Heidi DachKelsey Shivaay Stormont, MSW, LCSW 11/10/2017 1:57 PM

## 2017-11-10 NOTE — Plan of Care (Signed)
Patient up on the unit. She is pleasant and cooperative with care. Pain in legs she is stating it just feels tingling. Prn medications given. She denies si, hi,a vh. But does complain of anxiety. Safety  maintained. Will continue to monitor.

## 2017-11-10 NOTE — Progress Notes (Signed)
Patient denied SI/HI/AVH. Patient maintained safety while on unit. Patient was appropriate. Patient adhered with scheduled medication.  

## 2017-11-10 NOTE — Tx Team (Signed)
Interdisciplinary Treatment and Diagnostic Plan Update  11/10/2017 Time of Session: 11:00am Rachel George MRN: [id number]  Principal Diagnosis: Major depressive disorder, single episode, severe without psychosis (HCC)  Secondary Diagnoses: Principal Problem:   Major depressive disorder, single episode, severe without psychosis (HCC) Active Problems:   Morbid obesity (HCC)   Diabetic polyneuropathy associated with type 2 diabetes mellitus (HCC)   Diabetic retinopathy associated with type 2 diabetes mellitus (HCC)   Uncontrolled type 2 diabetes mellitus with hyperglycemia, with long-term current use of insulin (HCC)   Suicidal ideation   Current Medications:  Current Facility-Administered Medications  Medication Dose Route Frequency Provider Last Rate Last Dose  . alum & mag hydroxide-simeth (MAALOX/MYLANTA) 200-200-20 MG/5ML suspension 30 mL  30 mL Oral Q4H PRN Pucilowska, Jolanta B, MD      . atropine 1 % ophthalmic solution 1 drop  1 drop Right Eye TID Haskell Riling, MD   1 drop at 11/10/17 0749  . bacitracin-polymyxin b (POLYSPORIN) ophthalmic ointment 1 application  1 application Right Eye QID Haskell Riling, MD   1 application at 11/10/17 [phone]  . brimonidine (ALPHAGAN) 0.2 % ophthalmic solution 1 drop  1 drop Right Eye Q8H McNew, Ileene Hutchinson, MD   1 drop at 11/10/17 [phone]  . dorzolamide (TRUSOPT) 2 % ophthalmic solution 1 drop  1 drop Right Eye TID Haskell Riling, MD   1 drop at 11/10/17 0749  . escitalopram (LEXAPRO) tablet 10 mg  10 mg Oral Daily McNew, Ileene Hutchinson, MD   10 mg at 11/10/17 0748  . gabapentin (NEURONTIN) tablet 300 mg  300 mg Oral BID Beverly Sessions, MD   300 mg at 11/10/17 0748  . ibuprofen (ADVIL,MOTRIN) tablet 400 mg  400 mg Oral Q4H PRN Haskell Riling, MD   400 mg at 11/10/17 0841  . insulin aspart (novoLOG) injection 0-15 Units  0-15 Units Subcutaneous TID WC Pucilowska, Jolanta B, MD   3 Units at 11/09/17 1239  . insulin aspart (novoLOG) injection 0-5  Units  0-5 Units Subcutaneous QHS Pucilowska, Jolanta B, MD      . insulin aspart (novoLOG) injection 12 Units  12 Units Subcutaneous TID WC Pucilowska, Jolanta B, MD   12 Units at 11/10/17 0749  . insulin detemir (LEVEMIR) injection 50 Units  50 Units Subcutaneous QHS Pucilowska, Jolanta B, MD   50 Units at 11/09/17 2150  . loperamide (IMODIUM) capsule 2 mg  2 mg Oral PRN Haskell Riling, MD   2 mg at 11/05/17 1547  . magnesium hydroxide (MILK OF MAGNESIA) suspension 30 mL  30 mL Oral Daily PRN Pucilowska, Jolanta B, MD      . metFORMIN (GLUCOPHAGE) tablet 500 mg  500 mg Oral BID WC Pucilowska, Jolanta B, MD   500 mg at 11/10/17 0809  . pravastatin (PRAVACHOL) tablet 20 mg  20 mg Oral q1800 Pucilowska, Jolanta B, MD   20 mg at 11/09/17 1606  . protein supplement (PREMIER PROTEIN) liquid  11 oz Oral BID BM McNew, Ileene Hutchinson, MD   11 oz at 11/10/17 1038  . timolol (TIMOPTIC) 0.5 % ophthalmic solution 1 drop  1 drop Right Eye BID Haskell Riling, MD   1 drop at 11/10/17 0749  . traZODone (DESYREL) tablet 100 mg  100 mg Oral QHS PRN Haskell Riling, MD   100 mg at 11/09/17 2147   PTA Medications: Medications Prior to Admission  Medication Sig Dispense Refill Last Dose  . acetaminophen (TYLENOL) 500 MG tablet  Take 1,000 mg by mouth every 6 (six) hours as needed for moderate pain.   Past Week at Unknown time  . albuterol (PROVENTIL HFA;VENTOLIN HFA) 108 (90 Base) MCG/ACT inhaler Inhale 2 puffs into the lungs every 6 (six) hours as needed for wheezing or shortness of breath.   unknown  . atropine 1 % ophthalmic solution Place 1 drop into the right eye 3 (three) times daily.   11/03/2017 at Unknown time  . bacitracin-polymyxin b (POLYSPORIN) ophthalmic ointment Place 1 application into the right eye 4 (four) times daily.   11/03/2017 at Unknown time  . bismuth subsalicylate (PEPTO BISMOL) 262 MG/15ML suspension Take 30 mLs by mouth every 6 (six) hours as needed for indigestion.   Past Week at Unknown time  .  brimonidine (ALPHAGAN) 0.2 % ophthalmic solution Place 1 drop into the right eye every 8 (eight) hours.   11/03/2017 at Unknown time  . dorzolamide (TRUSOPT) 2 % ophthalmic solution Place 1 drop into the right eye 3 (three) times daily.   Past Week at Unknown time  . gabapentin (NEURONTIN) 300 MG capsule Take 1 capsule (300 mg total) by mouth 2 (two) times daily. 180 capsule 0 11/03/2017 at Unknown time  . gabapentin (NEURONTIN) 300 MG capsule Take 1 capsule by mouth 2 (two) times daily.   11/03/2017 at Unknown time  . insulin aspart (NOVOLOG) 100 UNIT/ML injection Inject 15 Units into the skin 3 (three) times daily before meals. 15 units plus sliding scales.   11/03/2017 at Unknown time  . insulin detemir (LEVEMIR) 100 UNIT/ML injection Inject 58 Units at bedtime into the skin. Reported on 04/16/2016   11/03/2017 at Unknown time  . lovastatin (MEVACOR) 20 MG tablet Take 1 tablet (20 mg total) by mouth at bedtime. 30 tablet 3 11/03/2017 at Unknown time  . lovastatin (MEVACOR) 20 MG tablet Take 1 tablet by mouth at bedtime.   11/03/2017 at Unknown time  . metFORMIN (GLUCOPHAGE) 500 MG tablet Take 1 tablet (500 mg total) by mouth 2 (two) times daily with a meal. 180 tablet 1 11/03/2017 at Unknown time  . moxifloxacin (VIGAMOX) 0.5 % ophthalmic solution Place 1 drop into the right eye 4 (four) times daily.   11/03/2017 at Unknown time  . prednisoLONE acetate (PRED FORTE) 1 % ophthalmic suspension Place 1 drop into the right eye 4 (four) times daily.   11/03/2017 at Unknown time  . sitaGLIPtin (JANUVIA) 100 MG tablet Take 1 tablet (100 mg total) by mouth daily. 90 tablet 2 11/03/2017 at Unknown time  . timolol (BETIMOL) 0.5 % ophthalmic solution Place 1 drop into the right eye 2 (two) times daily.   11/03/2017 at Unknown time    Patient Stressors: Financial difficulties Health problems Loss of Eye sight due to DM Complications Medication change or noncompliance  Patient Strengths: Ability for insight Average or  above average intelligence Capable of independent living Motivation for treatment/growth  Treatment Modalities: Medication Management, Group therapy, Case management,  1 to 1 session with clinician, Psychoeducation, Recreational therapy.   Physician Treatment Plan for Primary Diagnosis: Major depressive disorder, single episode, severe without psychosis (HCC) Long Term Goal(s): Improvement in symptoms so as ready for discharge Improvement in symptoms so as ready for discharge   Short Term Goals: Ability to identify changes in lifestyle to reduce recurrence of condition will improve Ability to verbalize feelings will improve Ability to identify changes in lifestyle to reduce recurrence of condition will improve Ability to disclose and discuss suicidal ideas Ability to  demonstrate self-control will improve Ability to identify and develop effective coping behaviors will improve Compliance with prescribed medications will improve Ability to identify triggers associated with substance abuse/mental health issues will improve  Medication Management: Evaluate patient's response, side effects, and tolerance of medication regimen.  Therapeutic Interventions: 1 to 1 sessions, Unit Group sessions and Medication administration.  Evaluation of Outcomes: Progressing  Physician Treatment Plan for Secondary Diagnosis: Principal Problem:   Major depressive disorder, single episode, severe without psychosis (HCC) Active Problems:   Morbid obesity (HCC)   Diabetic polyneuropathy associated with type 2 diabetes mellitus (HCC)   Diabetic retinopathy associated with type 2 diabetes mellitus (HCC)   Uncontrolled type 2 diabetes mellitus with hyperglycemia, with long-term current use of insulin (HCC)   Suicidal ideation  Long Term Goal(s): Improvement in symptoms so as ready for discharge Improvement in symptoms so as ready for discharge   Short Term Goals: Ability to identify changes in lifestyle to  reduce recurrence of condition will improve Ability to verbalize feelings will improve Ability to identify changes in lifestyle to reduce recurrence of condition will improve Ability to disclose and discuss suicidal ideas Ability to demonstrate self-control will improve Ability to identify and develop effective coping behaviors will improve Compliance with prescribed medications will improve Ability to identify triggers associated with substance abuse/mental health issues will improve     Medication Management: Evaluate patient's response, side effects, and tolerance of medication regimen.  Therapeutic Interventions: 1 to 1 sessions, Unit Group sessions and Medication administration.  Evaluation of Outcomes: Progressing   RN Treatment Plan for Primary Diagnosis: Major depressive disorder, single episode, severe without psychosis (HCC) Long Term Goal(s): Knowledge of disease and therapeutic regimen to maintain health will improve  Short Term Goals: Ability to remain free from injury will improve, Ability to verbalize frustration and anger appropriately will improve and Ability to identify and develop effective coping behaviors will improve  Medication Management: RN will administer medications as ordered by provider, will assess and evaluate patient's response and provide education to patient for prescribed medication. RN will report any adverse and/or side effects to prescribing provider.  Therapeutic Interventions: 1 on 1 counseling sessions, Psychoeducation, Medication administration, Evaluate responses to treatment, Monitor vital signs and CBGs as ordered, Perform/monitor CIWA, COWS, AIMS and Fall Risk screenings as ordered, Perform wound care treatments as ordered.  Evaluation of Outcomes: Progressing   LCSW Treatment Plan for Primary Diagnosis: Major depressive disorder, single episode, severe without psychosis (HCC) Long Term Goal(s): Safe transition to appropriate next level of  care at discharge, Engage patient in therapeutic group addressing interpersonal concerns.  Short Term Goals: Engage patient in aftercare planning with referrals and resources, Increase ability to appropriately verbalize feelings, Identify triggers associated with mental health/substance abuse issues and Increase skills for wellness and recovery  Therapeutic Interventions: Assess for all discharge needs, 1 to 1 time with Social worker, Explore available resources and support systems, Assess for adequacy in community support network, Educate family and significant other(s) on suicide prevention, Complete Psychosocial Assessment, Interpersonal group therapy.  Evaluation of Outcomes: Progressing   Progress in Treatment: Attending groups: Yes. Participating in groups: Yes. Taking medication as prescribed: Yes. Toleration medication: Yes. Family/Significant other contact made: Yes, contacted pt's husband.  Patient understands diagnosis: Yes. Discussing patient identified problems/goals with staff: Yes. Medical problems stabilized or resolved: Yes. Denies suicidal/homicidal ideation: No. Issues/concerns per patient self-inventory: No. Other:  None.   New problem(s) identified: None at this time.   New Short Term/Long Term  Goal(s): better manage emotions and cope with new medical diagnosis.  Discharge Plan or Barriers: Home with husband and follow up with outpatient provider TBD.  Reason for Continuation of Hospitalization: Anxiety Depression Medical Issues Medication stabilization  Estimated Length of Stay: 3-5 days  Recreational Therapy: Patient Stressors: Family, Relationship, Disability  Patient Goal: Patient will identify 3 benefits of self-care x5 days.   Attendees: Patient: 11/10/2017 12:39 PM  Physician: Dr. Jennet Maduro, MD 11/10/2017 12:39 PM  Nursing: Nile Riggs, RN 11/10/2017 12:39 PM  RN Care Manager: 11/10/2017 12:39 PM  Social Worker: Heidi Dach, LCSW  11/10/2017 12:39 PM  Recreational Therapist:  11/10/2017 12:39 PM  Other: Jake Shark, LCSW 11/10/2017 12:39 PM  Other:  11/10/2017 12:39 PM  Other: 11/10/2017 12:39 PM     Scribe for Treatment Team: Heidi Dach, LCSW 11/10/2017 12:53 PM

## 2017-11-10 NOTE — Plan of Care (Signed)
Pt able to verbally contract for safety this evening and states she can remain safe while on the unit. Pt states she feels, "good" today, but can get anxious at times about her eyes. Pt reports sleeping, "well". Pt compliant with medications this evening. Pt participates in unit activities and verbalizes understandings of education given.

## 2017-11-11 LAB — GLUCOSE, CAPILLARY
GLUCOSE-CAPILLARY: 100 mg/dL — AB (ref 65–99)
GLUCOSE-CAPILLARY: 65 mg/dL (ref 65–99)
Glucose-Capillary: 110 mg/dL — ABNORMAL HIGH (ref 65–99)
Glucose-Capillary: 158 mg/dL — ABNORMAL HIGH (ref 65–99)
Glucose-Capillary: 75 mg/dL (ref 65–99)

## 2017-11-11 NOTE — Progress Notes (Signed)
Hypoglycemic Event  CBG: 65  Treatment: 15 GM carbohydrate snack  Symptoms: Sweaty and Shaky  Follow-up CBG: Time:2100 CBG Result:75  Possible Reasons for Event: Unknown  Comments/MD notified: Continue to Monitor and document     Dominique Calvey T Miro Balderson

## 2017-11-11 NOTE — [facility] Group Notes (Signed)
LCSW Group Therapy Note 11/11/2017 9:00am  Type of Therapy and Topic:  Group Therapy:  Setting Goals  Participation Level:  Active  Description of Group: In this process group, patients discussed using strengths to work toward goals and address challenges.  Patients identified two positive things about themselves and one goal they were working on.  Patients were given the opportunity to share openly and support each other's plan for self-empowerment.  The group discussed the value of gratitude and were encouraged to have a daily reflection of positive characteristics or circumstances.  Patients were encouraged to identify a plan to utilize their strengths to work on current challenges and goals.  Therapeutic Goals 1. Patient will verbalize personal strengths/positive qualities and relate how these can assist with achieving desired personal goals 2. Patients will verbalize affirmation of peers plans for personal change and goal setting 3. Patients will explore the value of gratitude and positive focus as related to successful achievement of goals 4. Patients will verbalize a plan for regular reinforcement of personal positive qualities and circumstances.  Summary of Patient Progress:  Pt able to meet therapeutic goals.  Her goal for the day was "to maintain healthy habits and keep her sugar at healthy levels".  She shared concerns about her eyes and being able to see ophthalmologist.  CSW provided support and validated her feelings of concern and anxiety of not knowing what was happening to her eye.   Therapeutic Modalities Cognitive Behavioral Therapy Motivational Interviewing    Glennon MacSara P Reighlynn Swiney, LCSW 11/11/2017 4:34 PM

## 2017-11-11 NOTE — Plan of Care (Signed)
Patient verbalizes understanding of the general information that has been given to her and has been functioning at an adequate level thus far. Patient has been observed participating in unit activities as well as engaging with other members on the unit. Patient verbalizes understanding of and has been in compliance with her therapeutic/medication regimen at this time. Patient denies SI/HI/AVH at this time. Patient denies any signs/symptoms of depression at this time, however she does endorse having anxiety, stating "it's very high up there because I'm nervous about my eye" regarding her left eye", which she can still see out of at this time. Patient states that she is ready to talk to the MD about her discharge plan because "I've been here since Tuesday". Patient reports that she slept "ok" last night. Patient has been maintaining clinical measurements within normal limits and has shown improvement in her glucose levels thus far. Patient has been actively participating in her health management/stabilization. Patient has been participating in unit activities and has remained free from injury. Patient is safe on the unit.

## 2017-11-11 NOTE — Progress Notes (Signed)
D:Pt denies SI/HI/AVH. Pt able to verbally contract for safety this evening and states she can remain safe while on the unit. Pt states she feels, "good" today, but can get anxious at times about her eyes. Pt eager to see consult provider to examine her eyes during day shift today. Pt pleasant and cooperative with interview assessment this evening. Pt reports eating, "okay".   A: Q x 15 minute observation checks were completed for safety. Patient was provided with education. Patient was given scheduled medications. Patient  was encourage to attend groups, participate in unit activities and continue with plan of care.   R:Patient is complaint with medication and unit procedures. Pt reports sleeping, "well".            Patient slept for Estimated Hours of 7; Precautionary checks every 15 minutes for safety maintained, room free of safety hazards, patient sustains no injury or falls during this shift.

## 2017-11-11 NOTE — Progress Notes (Signed)
Recreation Therapy Notes  Date: 12.11.2018  Time: 1:00pm   Location: Craft Room  Behavioral response: Appropriate  Intervention Topic: Self-esteem  Discussion/Intervention: Group content todays was focused on self-esteem. The group defined self-esteem and where it comes from. Individual described why self-esteem is important and positive ways to improve self- esteem. Patients identified what normally impacts their self-esteem and things that contribute to self-esteem. The group participated in the intervention "Collage of you" and found positive way to represent them-selves. Clinical Observations/Feedback:  Patient came to group and defined self-esteem as the way you think about yourself. She stated that self-esteem comes from yourself. Individual identified that your health , substance use and self motivation are all factors that can affect self-esteem. She participated in the intervention and was social with peers and staff during group.  Tessla Spurling LRT/CTRS         Milagros Middendorf 11/11/2017 2:13 PM

## 2017-11-11 NOTE — Progress Notes (Signed)
D- Patient alert and oriented. Patient presents in a pleasant mood stating that she slept "ok" last night. Patient denies SI, HI, AVH, and pain at this time. Patient denies having any signs/ symptoms of depression at this time. Patient does endorse her anxiety level as "very high up there because of my eye, I'm getting nervous". Patient reports seeing "blinking lights and bleeding, pinkish color disrupting my vision in my left eye". Patient has an ophthalmologist consult put in and is waiting for that MD to come see her today. Patient states that she is ready to discuss her discharge plan with her MD today.  A- Scheduled medications administered to patient, per MD orders. Support and encouragement provided.  Routine safety checks conducted every 15 minutes.  Patient informed to notify staff with problems or concerns.  R- No adverse drug reactions noted. Patient contracts for safety at this time. Patient compliant with medications and treatment plan. Patient receptive, calm, and cooperative. Patient interacts well with others on the unit.  Patient remains safe at this time.

## 2017-11-11 NOTE — Progress Notes (Signed)
[facility]  11/11/2017 3:26 PM Rachel George  MRN:  [id number]  Subjective:  Pt states that she continues to have improvement in mood. She states that groups have really been helpful. She is trying to keep a positive attitude and progress through grieving process. She states that she is going to accept help from family when they try to help her. She states that she has been anxious about her eye. Ophthalmology will be here today to see her. She is getting bigger portions of her meal which has been helpful. She is sleeping well and eating well. Pt denies SI or thoughts of self harm. She would like to follow up with [facility].   Principal Problem: Major depressive disorder, single episode, severe without psychosis (HCC) Diagnosis:   Patient Active Problem List   Diagnosis Date Noted  . Major depressive disorder, single episode, severe without psychosis (HCC) [F32.2] 11/04/2017  . Suicidal ideation [R45.851] 11/04/2017  . 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 Uncontrolled type 2 diabetes mellitus with hyperglycemia, with long-term current use of insulin (HCC) [E11.65, Z79.4] 05/01/2017  . Obesity, Class III, BMI 40-49.9 (morbid obesity) (HCC) [E66.01] 05/01/2017  . Morbid obesity with BMI of 50.0-59.9, adult (HCC) [E66.01, Z68.43] 05/01/2017  . Diabetic retinopathy associated with type 2 diabetes mellitus (HCC) [E11.319] 03/18/2017  . S/P amputation [Z89.9] 02/06/2017  . Osteomyelitis (HCC) [M86.9] 12/13/2016  . Diabetic foot ulcer (HCC) [W09.811[E11.621, L97.509] 10/15/2016  . Cellulitis of toe of right foot [L03.031] 10/07/2016  . Uncontrolled type 2 diabetes mellitus with complication (HCC) [E11.8, E11.65] 09/09/2016  . Morbid obesity (HCC) [E66.01] 09/09/2016  . Diabetic polyneuropathy associated with type 2 diabetes mellitus (HCC) [E11.42] 09/09/2016   Total Time spent with patient: 20 minutes  Past Psychiatric History: See H&P  Past Medical History:  Past  Medical History:  Diagnosis Date  . Cataract   . Diabetes mellitus without complication (HCC)    dx age 36  . Fatty liver disease, nonalcoholic   . Macular degeneration   . Partial blindness     Past Surgical History:  Procedure Laterality Date  . AMPUTATION TOE Right 12/16/2016   Procedure: RIGHT FIRST TOE AMPUTATION;  Surgeon: Ancil LinseyJason Evan Davis, MD;  Location: [facility];  Service: General;  Laterality: Right;  Right first toe amputation for osteomyelitis  . eye lid transplant    . RETINAL LASER PROCEDURE     Family History:  Family History  Problem Relation Age of Onset  . Diabetes Father    Family Psychiatric  History: See H&P Social History:  Social History   Substance and Sexual Activity  Alcohol Use No     Social History   Substance and Sexual Activity  Drug Use No    Social History   Socioeconomic History  . Marital status: Married    Spouse name: Christena DeemShane Edsall  . Number of children: None  . Years of education: None  . Highest education level: None  Social Needs  . Financial resource strain: None  . Food insecurity - worry: None  . Food insecurity - inability: None  . Transportation needs - medical: None  . Transportation needs - non-medical: None  Occupational History  . Occupation: CNA personal care giver  Tobacco Use  . Smoking status: Former Smoker    Packs/day: 0.25    Types: Cigarettes    Last attempt to quit: 12/11/2016    Years since quitting: 0.9  . Smokeless tobacco: Never Used  Substance and  Sexual Activity  . Alcohol use: No  . Drug use: No  . Sexual activity: Yes    Partners: Male    Birth control/protection: None  Other Topics Concern  . None  Social History Narrative  . None   Additional Social History:                         Sleep: Good  Appetite:  Good  Current Medications: Current Facility-Administered Medications  Medication Dose Route Frequency Provider Last Rate Last Dose  . alum & mag hydroxide-simeth  (MAALOX/MYLANTA) 200-200-20 MG/5ML suspension 30 mL  30 mL Oral Q4H PRN Pucilowska, Jolanta B, MD      . atropine 1 % ophthalmic solution 1 drop  1 drop Right Eye TID Yanina Knupp, Ileene HutchinsonHolly R, MD   1 drop at 11/11/17 1136  . bacitracin-polymyxin b (POLYSPORIN) ophthalmic ointment 1 application  1 application Right Eye QID Haskell RilingMcNew, Tamina Cyphers R, MD   1 application at 11/10/17 2121  . brimonidine (ALPHAGAN) 0.2 % ophthalmic solution 1 drop  1 drop Right Eye Q8H Azael Ragain R, MD   1 drop at 11/11/17 1418  . dorzolamide (TRUSOPT) 2 % ophthalmic solution 1 drop  1 drop Right Eye TID Haskell RilingMcNew, Alontae Chaloux R, MD   1 drop at 11/11/17 1135  . escitalopram (LEXAPRO) tablet 10 mg  10 mg Oral Daily Irvine Glorioso, Ileene HutchinsonHolly R, MD   10 mg at 11/11/17 [id number]  . gabapentin (NEURONTIN) tablet 300 mg  300 mg Oral BID Beverly SessionsSubedi, Jagannath, MD   300 mg at 11/11/17 [id number]  . ibuprofen (ADVIL,MOTRIN) tablet 400 mg  400 mg Oral Q4H PRN Haskell RilingMcNew, Elowyn Raupp R, MD   400 mg at 11/10/17 2124  . insulin aspart (novoLOG) injection 0-15 Units  0-15 Units Subcutaneous TID WC Pucilowska, Jolanta B, MD   3 Units at 11/11/17 1223  . insulin aspart (novoLOG) injection 0-5 Units  0-5 Units Subcutaneous QHS Pucilowska, Jolanta B, MD      . insulin aspart (novoLOG) injection 12 Units  12 Units Subcutaneous TID WC Pucilowska, Jolanta B, MD   12 Units at 11/11/17 1222  . insulin detemir (LEVEMIR) injection 50 Units  50 Units Subcutaneous QHS Pucilowska, Jolanta B, MD   50 Units at 11/10/17 2125  . loperamide (IMODIUM) capsule 2 mg  2 mg Oral PRN Haskell RilingMcNew, Sheretha Shadd R, MD   2 mg at 11/05/17 1547  . magnesium hydroxide (MILK OF MAGNESIA) suspension 30 mL  30 mL Oral Daily PRN Pucilowska, Jolanta B, MD      . metFORMIN (GLUCOPHAGE) tablet 500 mg  500 mg Oral BID WC Pucilowska, Jolanta B, MD   500 mg at 11/11/17 0828  . pravastatin (PRAVACHOL) tablet 20 mg  20 mg Oral q1800 Pucilowska, Jolanta B, MD   20 mg at 11/10/17 1701  . protein supplement (PREMIER PROTEIN) liquid  11 oz Oral BID BM Aparna Vanderweele,  Ileene HutchinsonHolly R, MD   11 oz at 11/10/17 1038  . timolol (TIMOPTIC) 0.5 % ophthalmic solution 1 drop  1 drop Right Eye BID Haskell RilingMcNew, Slayde Brault R, MD   1 drop at 11/11/17 0834  . traZODone (DESYREL) tablet 100 mg  100 mg Oral QHS PRN Haskell RilingMcNew, Bodie Abernethy R, MD   100 mg at 11/10/17 2124    Lab Results:  Results for orders placed or performed during the hospital encounter of 11/04/17 (from the past 48 hour(s))  Glucose, capillary     Status: None   Collection Time: 11/09/17  4:10 PM  Result Value Ref Range   Glucose-Capillary 94 65 - 99 mg/dL  Glucose, capillary     Status: Abnormal   Collection Time: 11/09/17  8:20 PM  Result Value Ref Range   Glucose-Capillary 147 (H) 65 - 99 mg/dL   Comment 1 Notify RN   Glucose, capillary     Status: Abnormal   Collection Time: 11/10/17  7:14 AM  Result Value Ref Range   Glucose-Capillary 120 (H) 65 - 99 mg/dL   Comment 1 Notify RN   Glucose, capillary     Status: Abnormal   Collection Time: 11/10/17  9:37 AM  Result Value Ref Range   Glucose-Capillary 198 (H) 65 - 99 mg/dL  Glucose, capillary     Status: Abnormal   Collection Time: 11/10/17  2:44 PM  Result Value Ref Range   Glucose-Capillary 169 (H) 65 - 99 mg/dL  Glucose, capillary     Status: Abnormal   Collection Time: 11/10/17  4:46 PM  Result Value Ref Range   Glucose-Capillary 142 (H) 65 - 99 mg/dL  Glucose, capillary     Status: Abnormal   Collection Time: 11/10/17  8:11 PM  Result Value Ref Range   Glucose-Capillary 100 (H) 65 - 99 mg/dL   Comment 1 Notify RN   Glucose, capillary     Status: Abnormal   Collection Time: 11/11/17  6:59 AM  Result Value Ref Range   Glucose-Capillary 110 (H) 65 - 99 mg/dL   Comment 1 Notify RN   Glucose, capillary     Status: Abnormal   Collection Time: 11/11/17 11:42 AM  Result Value Ref Range   Glucose-Capillary 158 (H) 65 - 99 mg/dL    Blood Alcohol level:  Lab Results  Component Value Date   ETH <10 11/04/2017    Metabolic Disorder Labs: Lab Results   Component Value Date   HGBA1C 8.5 (H) 11/05/2017   MPG 197.25 11/05/2017   MPG 220.21 10/13/2017   No results found for: PROLACTIN Lab Results  Component Value Date   CHOL 156 11/05/2017   TRIG 121 11/05/2017   HDL 44 11/05/2017   CHOLHDL 3.5 11/05/2017   VLDL 24 11/05/2017   LDLCALC 88 11/05/2017   LDLCALC 92 07/30/2017    Physical Findings: AIMS: Facial and Oral Movements Muscles of Facial Expression: None, normal Lips and Perioral Area: None, normal Jaw: None, normal Tongue: None, normal,Extremity Movements Upper (arms, wrists, hands, fingers): None, normal Lower (legs, knees, ankles, toes): None, normal, Trunk Movements Neck, shoulders, hips: None, normal, Overall Severity Severity of abnormal movements (highest score from questions above): None, normal Incapacitation due to abnormal movements: None, normal Patient's awareness of abnormal movements (rate only patient's report): No Awareness, Dental Status Current problems with teeth and/or dentures?: No Does patient usually wear dentures?: No  CIWA:    COWS:     Musculoskeletal: Strength & Muscle Tone: within normal limits Gait & Station: normal Patient leans: N/A  Psychiatric Specialty Exam: Physical Exam  Nursing note and vitals reviewed.   Review of Systems  All other systems reviewed and are negative.   Blood pressure (!) 120/42, pulse 87, temperature 97.9 F (36.6 C), temperature source Oral, resp. rate 18, height 5\' 7"  (1.702 m), weight (!) 158.3 kg (349 lb), last menstrual period 11/03/2017.Body mass index is 54.66 kg/m.  General Appearance: Casual  Eye Contact:  Good  Speech:  Clear and Coherent  Volume:  Normal  Mood:  Depressed  Affect:  Constricted  Thought Process:  Goal Directed  Orientation:  Full (Time, Place, and Person)  Thought Content:  Negative  Suicidal Thoughts:  No  Homicidal Thoughts:  No  Memory:  Immediate;   Fair  Judgement:  Fair  Insight:  Fair  Psychomotor Activity:   Normal  Concentration:  Concentration: Fair  Recall:  [employer] of Knowledge:  Fair  Language:  Fair  Akathisia:  No      Assets:  Communication Skills Desire for Improvement Resilience  ADL's:  Intact  Cognition:  Good  Sleep:  Number of Hours: 7     Treatment Plan Summary: 36 yo female admitted due to SI. Mood and SI are improving. She is much more positive and future oriented. She is doing very well in groups.  Plan:  MDD: Continue Lexapro 10 mg daily  Anxiety -Gabapentin 300 mg BID  DM -Leveimir 50 units qhs -Novolog 12 units TID -sliding scale -Metformin  HLD -Pravastatin  Macular Degeneration -Ophthalmology consulted and will see her today  Dispo/Social -Pt will return home with husband on discharge and follow up with Colan Neptune, MD 11/11/2017, 3:26 PM

## 2017-11-11 NOTE — [facility] Group Notes (Signed)
11/11/2017 9:30am  Type of Therapy/Topic:  Group Therapy:  Feelings about Diagnosis  Participation Level:  Active   Description of Group:   This group will allow patients to explore their thoughts and feelings about diagnoses they have received. Patients will be guided to explore their level of understanding and acceptance of these diagnoses. Facilitator will encourage patients to process their thoughts and feelings about the reactions of others to their diagnosis and will guide patients in identifying ways to discuss their diagnosis with significant others in their lives. This group will be process-oriented, with patients participating in exploration of their own experiences, giving and receiving support, and processing challenge from other group members.   Therapeutic Goals: 1. Patient will demonstrate understanding of diagnosis as evidenced by identifying two or more symptoms of the disorder 2. Patient will be able to express two feelings regarding the diagnosis 3. Patient will demonstrate their ability to communicate their needs through discussion and/or role play  Summary of Patient Progress:   Actively participated, stayed the entire time. Rachel BraunKaren says that she experiences "depression and mood swings" as a symptom of her diagnosis. She states that when initially diagnosed she felt "accepting of her diagnosis"  She says that she is continuing to work though her acceptance. She says that when communicating her needs to other people she will let them know how she is feeling. Mood was good, reality based thinking.       Therapeutic Modalities:   Cognitive Behavioral Therapy Brief Therapy Feelings Identification    Johny ShearsCassandra  Orlo Brickle, LCSW 11/11/2017 10:30 AM

## 2017-11-11 NOTE — Progress Notes (Signed)
Patient attended the spiritual group that the chaplain led today. The topic of discussion was community and spirituality. Pt contributed to the discussion and pleasantly talked about how the community has accepted her and help her. Pt stated that she found hope in this place and have learned a lot about himself and others. Pt interacted well with other patients and acted in a respectful manner. Pt was honest as she shared her fears and hopes with the group. Chaplain provided support and guided the group discussion.   11/11/17 1700  Clinical Encounter Type  Visited With Patient  Visit Type Spiritual support  Referral From Chaplain  Consult/Referral To Chaplain  Spiritual Encounters  Spiritual Needs Emotional;Other (Comment)

## 2017-11-12 ENCOUNTER — Ambulatory Visit: Payer: No Typology Code available for payment source | Admitting: "Endocrinology

## 2017-11-12 LAB — GLUCOSE, CAPILLARY
GLUCOSE-CAPILLARY: 139 mg/dL — AB (ref 65–99)
GLUCOSE-CAPILLARY: 82 mg/dL (ref 65–99)
Glucose-Capillary: 119 mg/dL — ABNORMAL HIGH (ref 65–99)
Glucose-Capillary: 82 mg/dL (ref 65–99)

## 2017-11-12 MED ORDER — HYDROXYZINE HCL 50 MG PO TABS
50.0000 mg | ORAL_TABLET | Freq: Three times a day (TID) | ORAL | Status: DC | PRN
  Administered 2017-11-12 (×2): 50 mg via ORAL
  Filled 2017-11-12 (×2): qty 1

## 2017-11-12 MED ORDER — HYDROXYZINE HCL 50 MG PO TABS: 50.0000 mg | ORAL_TABLET | Freq: Three times a day (TID) | ORAL | 0 refills | Status: DC | PRN

## 2017-11-12 MED ORDER — TRAZODONE HCL 100 MG PO TABS: 100.0000 mg | ORAL_TABLET | Freq: Every evening | ORAL | 0 refills | Status: DC | PRN

## 2017-11-12 MED ORDER — ESCITALOPRAM OXALATE 10 MG PO TABS: 10.0000 mg | ORAL_TABLET | Freq: Every day | ORAL | 0 refills | Status: DC

## 2017-11-12 MED ORDER — INSULIN ASPART 100 UNIT/ML ~~LOC~~ SOLN
9.0000 [IU] | Freq: Three times a day (TID) | SUBCUTANEOUS | Status: DC
  Administered 2017-11-12 – 2017-11-13 (×3): 9 [IU] via SUBCUTANEOUS
  Filled 2017-11-12: qty 1

## 2017-11-12 NOTE — Progress Notes (Signed)
Received Rachel George this am before breakfast, she was compliant with her medications and insulin. She self administer her eye gtts in her right eye. She was medicated for anxiety, Vistaril, per her request.  She denied all other psychiatric symptoms. She has been OOB at intervals for meals and brief socialization with her peers.

## 2017-11-12 NOTE — [facility] Group Notes (Signed)
  11/12/2017  Time: 0930  Type of Therapy/Topic:  Group Therapy:  Emotion Regulation  Participation Level:  Did Not Attend   Description of Group:    The purpose of this group is to assist patients in learning to regulate negative emotions and experience positive emotions. Patients will be guided to discuss ways in which they have been vulnerable to their negative emotions. These vulnerabilities will be juxtaposed with experiences of positive emotions or situations, and patients will be challenged to use positive emotions to combat negative ones. Special emphasis will be placed on coping with negative emotions in conflict situations, and patients will process healthy conflict resolution skills.  Therapeutic Goals: 1. Patient will identify two positive emotions or experiences to reflect on in order to balance out negative emotions 2. Patient will label two or more emotions that they find the most difficult to experience 3. Patient will demonstrate positive conflict resolution skills through discussion and/or role plays  Summary of Patient Progress: Pt was invited to attend group but chose not to attend. CSW will continue to encourage pt to attend group throughout their admission.    Therapeutic Modalities:   Cognitive Behavioral Therapy Feelings Identification Dialectical Behavioral Therapy  Heidi DachKelsey Aliviana Burdell, MSW, LCSW 11/12/2017 10:26 AM

## 2017-11-12 NOTE — Consult Note (Signed)
Reason for Consult: evaluate flashes of light in left eye Referring Physician: Dr. Gary Fleetladosu, [facility]. Chief complaint: flashes of light left eye.  HPI: Rachel George is an 36 y.o. female with PMH of poorly controlled diabetes hospitalized for depression and suicidal ideation.  She reports that she has become more depressed since her vision has decreased over the past several months.  She reports that she had 20/50 vision in the left eye in March 2018 but had difficulty getting [facility]mologists due to financial constraints.  She reports poor vision in the right eye for over a year and regrets not seeking eye care sooner.  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  She has no previous eye surgeries prior to October of 2018.  Since then, she has had cataract and retina surgery in the right eye, laser treatment in the left eye.    She has had an aggressive treatment plan with Dr. Sherryll BurgerShah, retina specialist at [facility] which will include intravitreal injections to treat the diabetic eye diesease.  She is now concerned about the remaining vision in the left eye.  [facility] this week but this was canceled due to inclement weather.  Her primary complaint is flashes of light in the left eye and pinkish discharge.  She reports that the right eye is feeling ok, she is taking the drops, endorsing poor vision out of the right eye.    Past Medical History:  Diagnosis Date  . Cataract   . Diabetes mellitus without complication (HCC)    dx age 36  . Fatty liver disease, nonalcoholic   . Macular degeneration   . Partial blindness     ROS  Past Surgical History:  Procedure Laterality Date  . AMPUTATION TOE Right 12/16/2016   Procedure: RIGHT FIRST TOE AMPUTATION;  Surgeon: Ancil LinseyJason Evan Davis, MD;  Location: [facility];   Service: General;  Laterality: Right;  Right first toe amputation for osteomyelitis  . eye lid transplant    . RETINAL LASER PROCEDURE      Family History  Problem Relation Age of Onset  . Diabetes Father     Social History:  reports that she quit smoking about 11 months ago. Her smoking use included cigarettes. She smoked 0.25 packs per day. she has never used smokeless tobacco. She reports that she does not drink alcohol or use drugs.  Allergies:  Allergies  Allergen Reactions  . Shellfish Allergy Anaphylaxis, Shortness Of Breath and Swelling    Facial Swelling  . Percocet [Oxycodone-Acetaminophen] Itching and Nausea And Vomiting    Prior to Admission medications   Medication Sig Start Date End Date Taking? Authorizing Provider  acetaminophen (TYLENOL) 500 MG tablet Take 1,000 mg by mouth every 6 (six) hours as needed for moderate pain.    [provider]  albuterol (PROVENTIL HFA;VENTOLIN HFA) 108 (90 Base) MCG/ACT inhaler Inhale 2 puffs into the lungs every 6 (six) hours as needed for wheezing or shortness of breath.    [provider]  atropine 1 % ophthalmic solution Place 1 drop into the right eye 3 (three) times daily. 10/14/17   [provider]  bacitracin-polymyxin b (POLYSPORIN) ophthalmic ointment Place 1 application into the right eye 4 (four) times daily. 10/14/17   [provider]  bismuth subsalicylate (PEPTO BISMOL) 262 MG/15ML suspension Take 30 mLs by mouth  every 6 (six) hours as needed for indigestion.    [provider]  brimonidine (ALPHAGAN) 0.2 % ophthalmic solution Place 1 drop into the right eye every 8 (eight) hours. 10/14/17   [provider]  dorzolamide (TRUSOPT) 2 % ophthalmic solution Place 1 drop into the right eye 3 (three) times daily. 10/14/17   [provider]  escitalopram (LEXAPRO) 10 MG tablet Take 1 tablet (10 mg total) by mouth daily. 11/13/17   McNew, Ileene Hutchinson, MD  gabapentin  (NEURONTIN) 300 MG capsule Take 1 capsule (300 mg total) by mouth 2 (two) times daily. 03/18/17   Jacquelin Hawking, PA-C  gabapentin (NEURONTIN) 300 MG capsule Take 1 capsule by mouth 2 (two) times daily. 03/18/17   [provider]  hydrOXYzine (ATARAX/VISTARIL) 50 MG tablet Take 1 tablet (50 mg total) by mouth 3 (three) times daily as needed for anxiety. 11/12/17   McNew, Ileene Hutchinson, MD  insulin aspart (NOVOLOG) 100 UNIT/ML injection Inject 15 Units into the skin 3 (three) times daily before meals. 15 units plus sliding scales.    [provider]  insulin detemir (LEVEMIR) 100 UNIT/ML injection Inject 58 Units at bedtime into the skin. Reported on 04/16/2016    [provider]  lovastatin (MEVACOR) 20 MG tablet Take 1 tablet (20 mg total) by mouth at bedtime. 04/24/17   Jacquelin Hawking, PA-C  lovastatin (MEVACOR) 20 MG tablet Take 1 tablet by mouth at bedtime. 04/24/17   [provider]  metFORMIN (GLUCOPHAGE) 500 MG tablet Take 1 tablet (500 mg total) by mouth 2 (two) times daily with a meal. 02/06/17   Jacquelin Hawking, PA-C  moxifloxacin (VIGAMOX) 0.5 % ophthalmic solution Place 1 drop into the right eye 4 (four) times daily.    [provider]  prednisoLONE acetate (PRED FORTE) 1 % ophthalmic suspension Place 1 drop into the right eye 4 (four) times daily. 10/14/17   [provider]  sitaGLIPtin (JANUVIA) 100 MG tablet Take 1 tablet (100 mg total) by mouth daily. 01/01/17   Jacquelin Hawking, PA-C  timolol (BETIMOL) 0.5 % ophthalmic solution Place 1 drop into the right eye 2 (two) times daily. 10/14/17   [provider]  traZODone (DESYREL) 100 MG tablet Take 1 tablet (100 mg total) by mouth at bedtime as needed for sleep. 11/12/17   McNew, Ileene Hutchinson, MD    Results for orders placed or performed during the hospital encounter of 11/04/17 (from the past 48 hour(s))  Glucose, capillary     Status: Abnormal   Collection Time: 11/10/17  2:44 PM   Result Value Ref Range   Glucose-Capillary 169 (H) 65 - 99 mg/dL  Glucose, capillary     Status: Abnormal   Collection Time: 11/10/17  4:46 PM  Result Value Ref Range   Glucose-Capillary 142 (H) 65 - 99 mg/dL  Glucose, capillary     Status: Abnormal   Collection Time: 11/10/17  8:11 PM  Result Value Ref Range   Glucose-Capillary 100 (H) 65 - 99 mg/dL   Comment 1 Notify RN   Glucose, capillary     Status: Abnormal   Collection Time: 11/11/17  6:59 AM  Result Value Ref Range   Glucose-Capillary 110 (H) 65 - 99 mg/dL   Comment 1 Notify RN   Glucose, capillary     Status: Abnormal   Collection Time: 11/11/17 11:42 AM  Result Value Ref Range   Glucose-Capillary 158 (H) 65 - 99 mg/dL  Glucose, capillary     Status:  Abnormal   Collection Time: 11/11/17  4:20 PM  Result Value Ref Range   Glucose-Capillary 100 (H) 65 - 99 mg/dL  Glucose, capillary     Status: None   Collection Time: 11/11/17  8:24 PM  Result Value Ref Range   Glucose-Capillary 65 65 - 99 mg/dL  Glucose, capillary     Status: None   Collection Time: 11/11/17  8:58 PM  Result Value Ref Range   Glucose-Capillary 75 65 - 99 mg/dL  Glucose, capillary     Status: Abnormal   Collection Time: 11/12/17  7:11 AM  Result Value Ref Range   Glucose-Capillary 139 (H) 65 - 99 mg/dL   Comment 1 Notify RN     No results found.  Blood pressure (!) 120/42, pulse 87, temperature 97.9 F (36.6 C), temperature source Oral, resp. rate 18, height 5\' 7"  (1.702 m), weight (!) 158.3 kg (349 lb), last menstrual period 11/03/2017.  General: obese, ambulatory.  Mental status: Alert and Oriented x 4;  Cooperative, interactive, communicative, pleasant, at times anxious about vision and prognosis.  Visual Acuity:  Light perception only OD  20/400 near [place]  Pupils:  Round, right is pharmacologically dilated. Left is minimally reactive to light.  Afferent pupillary defect in the rigth by reverse.    Motility:  Full  Visual Fields:  Full  to count fingers in the left, unable in the right.  IOP:  Soft by palpation ou.  External/ Lids/ Lashes:  Mild ptosis OD.  Mild blepharitis in both eyes.   Anterior Segment: (handheld slit lamp).   Conjunctiva:  Hyperemic OD, vicryl sutures OD.  Single interrupted corneal suture in the right eye.     Left eye is wnl.   Cornea:  Clear OU  Anterior Chamber: 1mm hyphema in the right eye.  No frank NVI detected.   Lens:   Intraocular lens OD, superior edge of optic visible, but mostly centered.     1.5 Nuclear sclerosis OS   Posterior Segment: Dilated OS with 1% Tropicamide and 2.5% Phenylephrine (already dilated OD).   Discs:   Obscured OU.     Macula:  Large preretinal fibrosis and neovascularization, tractional retinal detachments.  In the right eye, total retinal detachment.  In the left eye, there is some peripheral panretinal photocoagulation as well as some red heme inferiorly.     Assessment/Plan:  36 yo female with severe diabetic eye disease.  Poor prognosis in the right eye with no chance of functional vision in the right eye.    Left eye has severe proliferative diabetic retinopathy and severe permanent damage to the vision which will require aggressive treatment to prevent further vision loss/total blindness.  No signs of infection in the left eye.  Mild blepharitis.   Plan:  1.  Continue current eye drops in the right eye. 2.  Recommend continued close followup with [facility] retina specialist Dr. Sherryll BurgerShah.  3.  Clean eyelashes and eyelids with warm soap and water.  No further medication indicated.  Signing off.   Willey BladeBradley King 11/12/2017, 10:51 AM

## 2017-11-12 NOTE — Plan of Care (Signed)
Patient slept for Estimated Hours of 6.30; Precautionary checks every 15 minutes for safety maintained, room free of safety hazards, patient sustains no injury or falls during this shift.  

## 2017-11-12 NOTE — Progress Notes (Signed)
[facility] MD Progress Note  11/12/2017 10:24 AM Rachel George  MRN:  [id number]   Subjective:  Pt states that mood is improving. She has a lot of anxiety pertaining to her vision and her eyes. She is having a lot of eye pain and is worried about this. Ophthalmology was not able to see her yesterday but will see her today. Pt states that she has been talking to her husband daily and things are going well. He is more supportive. She denies SI or HI and these feelings have improved. She has participated well in groups and has gotten a lot out of them. She slept well. Appetite is good. She did have episode of hypoglycemia yesterday. DM care manager on board and will adjust insulin.   Principal Problem: Major depressive disorder, single episode, severe without psychosis (HCC) Diagnosis:   Patient Active Problem List   Diagnosis Date Noted  . Major depressive disorder, single episode, severe without psychosis (HCC) [F32.2] 11/04/2017  . Suicidal ideation [R45.851] 11/04/2017  . 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 Uncontrolled type 2 diabetes mellitus with hyperglycemia, with long-term current use of insulin (HCC) [E11.65, Z79.4] 05/01/2017  . Obesity, Class III, BMI 40-49.9 (morbid obesity) (HCC) [E66.01] 05/01/2017  . Morbid obesity with BMI of 50.0-59.9, adult (HCC) [E66.01, Z68.43] 05/01/2017  . Diabetic retinopathy associated with type 2 diabetes mellitus (HCC) [E11.319] 03/18/2017  . S/P amputation [Z89.9] 02/06/2017  . Osteomyelitis (HCC) [M86.9] 12/13/2016  . Diabetic foot ulcer (HCC) [J81.191, L97.509] 10/15/2016  . Cellulitis of toe of right foot [L03.031] 10/07/2016  . Uncontrolled type 2 diabetes mellitus with complication (HCC) [E11.8, E11.65] 09/09/2016  . Morbid obesity (HCC) [E66.01] 09/09/2016  . Diabetic polyneuropathy associated with type 2 diabetes mellitus (HCC) [E11.42] 09/09/2016   Total Time spent with patient: 20 minutes  Past Psychiatric  History: See H&P  Past Medical History:  Past Medical History:  Diagnosis Date  . Cataract   . Diabetes mellitus without complication (HCC)    dx age 17  . Fatty liver disease, nonalcoholic   . Macular degeneration   . Partial blindness     Past Surgical History:  Procedure Laterality Date  . AMPUTATION TOE Right 12/16/2016   Procedure: RIGHT FIRST TOE AMPUTATION;  Surgeon: Ancil Linsey, MD;  Location: [facility];  Service: General;  Laterality: Right;  Right first toe amputation for osteomyelitis  . eye lid transplant    . RETINAL LASER PROCEDURE     Family History:  Family History  Problem Relation Age of Onset  . Diabetes Father    Family Psychiatric  History: See H&P Social History:  Social History   Substance and Sexual Activity  Alcohol Use No     Social History   Substance and Sexual Activity  Drug Use No    Social History   Socioeconomic History  . Marital status: Married    Spouse name: Yomira Flitton  . Number of children: None  . Years of education: None  . Highest education level: None  Social Needs  . Financial resource strain: None  . Food insecurity - worry: None  . Food insecurity - inability: None  . Transportation needs - medical: None  . Transportation needs - non-medical: None  Occupational History  . Occupation: CNA personal care giver  Tobacco Use  . Smoking status: Former Smoker    Packs/day: 0.25    Types: Cigarettes    Last attempt to quit: 12/11/2016    Years since  quitting: 0.9  . Smokeless tobacco: Never Used  Substance and Sexual Activity  . Alcohol use: No  . Drug use: No  . Sexual activity: Yes    Partners: Male    Birth control/protection: None  Other Topics Concern  . None  Social History Narrative  . None   Additional Social History:                         Sleep: Fair  Appetite:  Fair  Current Medications: Current Facility-Administered Medications  Medication Dose Route Frequency Provider Last  Rate Last Dose  . alum & mag hydroxide-simeth (MAALOX/MYLANTA) 200-200-20 MG/5ML suspension 30 mL  30 mL Oral Q4H PRN Pucilowska, Jolanta B, MD      . atropine 1 % ophthalmic solution 1 drop  1 drop Right Eye TID Haskell RilingMcNew, Merelyn Klump R, MD   1 drop at 11/12/17 0801  . bacitracin-polymyxin b (POLYSPORIN) ophthalmic ointment 1 application  1 application Right Eye QID Salma Walrond, Ileene HutchinsonHolly R, MD   1 application at 11/12/17 0802  . brimonidine (ALPHAGAN) 0.2 % ophthalmic solution 1 drop  1 drop Right Eye Q8H Michae Grimley, Ileene HutchinsonHolly R, MD   1 drop at 11/12/17 [phone]  . dorzolamide (TRUSOPT) 2 % ophthalmic solution 1 drop  1 drop Right Eye TID Haskell RilingMcNew, Maryfrances Portugal R, MD   1 drop at 11/12/17 0803  . escitalopram (LEXAPRO) tablet 10 mg  10 mg Oral Daily Cierria Height, Ileene HutchinsonHolly R, MD   10 mg at 11/12/17 0758  . gabapentin (NEURONTIN) tablet 300 mg  300 mg Oral BID Beverly SessionsSubedi, Jagannath, MD   300 mg at 11/12/17 0807  . ibuprofen (ADVIL,MOTRIN) tablet 400 mg  400 mg Oral Q4H PRN Haskell RilingMcNew, Shanisha Lech R, MD   400 mg at 11/12/17 0756  . insulin aspart (novoLOG) injection 0-15 Units  0-15 Units Subcutaneous TID WC Pucilowska, Jolanta B, MD   2 Units at 11/12/17 0806  . insulin aspart (novoLOG) injection 0-5 Units  0-5 Units Subcutaneous QHS Pucilowska, Jolanta B, MD      . insulin aspart (novoLOG) injection 9 Units  9 Units Subcutaneous TID WC Willaim Mode R, MD      . insulin detemir (LEVEMIR) injection 50 Units  50 Units Subcutaneous QHS Pucilowska, Jolanta B, MD   50 Units at 11/10/17 2125  . loperamide (IMODIUM) capsule 2 mg  2 mg Oral PRN Haskell RilingMcNew, Kel Senn R, MD   2 mg at 11/05/17 1547  . magnesium hydroxide (MILK OF MAGNESIA) suspension 30 mL  30 mL Oral Daily PRN Pucilowska, Jolanta B, MD      . metFORMIN (GLUCOPHAGE) tablet 500 mg  500 mg Oral BID WC Pucilowska, Jolanta B, MD   500 mg at 11/12/17 0758  . pravastatin (PRAVACHOL) tablet 20 mg  20 mg Oral q1800 Pucilowska, Jolanta B, MD   20 mg at 11/11/17 1734  . protein supplement (PREMIER PROTEIN) liquid  11 oz Oral BID  BM Felix Meras, Ileene HutchinsonHolly R, MD   11 oz at 11/10/17 1038  . timolol (TIMOPTIC) 0.5 % ophthalmic solution 1 drop  1 drop Right Eye BID Haskell RilingMcNew, Jazma Pickel R, MD   1 drop at 11/12/17 0801  . traZODone (DESYREL) tablet 100 mg  100 mg Oral QHS PRN Haskell RilingMcNew, Addilynn Mowrer R, MD   100 mg at 11/11/17 2027    Lab Results:  Results for orders placed or performed during the hospital encounter of 11/04/17 (from the past 48 hour(s))  Glucose, capillary     Status: Abnormal  Collection Time: 11/10/17  2:44 PM  Result Value Ref Range   Glucose-Capillary 169 (H) 65 - 99 mg/dL  Glucose, capillary     Status: Abnormal   Collection Time: 11/10/17  4:46 PM  Result Value Ref Range   Glucose-Capillary 142 (H) 65 - 99 mg/dL  Glucose, capillary     Status: Abnormal   Collection Time: 11/10/17  8:11 PM  Result Value Ref Range   Glucose-Capillary 100 (H) 65 - 99 mg/dL   Comment 1 Notify RN   Glucose, capillary     Status: Abnormal   Collection Time: 11/11/17  6:59 AM  Result Value Ref Range   Glucose-Capillary 110 (H) 65 - 99 mg/dL   Comment 1 Notify RN   Glucose, capillary     Status: Abnormal   Collection Time: 11/11/17 11:42 AM  Result Value Ref Range   Glucose-Capillary 158 (H) 65 - 99 mg/dL  Glucose, capillary     Status: Abnormal   Collection Time: 11/11/17  4:20 PM  Result Value Ref Range   Glucose-Capillary 100 (H) 65 - 99 mg/dL  Glucose, capillary     Status: None   Collection Time: 11/11/17  8:24 PM  Result Value Ref Range   Glucose-Capillary 65 65 - 99 mg/dL  Glucose, capillary     Status: None   Collection Time: 11/11/17  8:58 PM  Result Value Ref Range   Glucose-Capillary 75 65 - 99 mg/dL  Glucose, capillary     Status: Abnormal   Collection Time: 11/12/17  7:11 AM  Result Value Ref Range   Glucose-Capillary 139 (H) 65 - 99 mg/dL   Comment 1 Notify RN     Blood Alcohol level:  Lab Results  Component Value Date   ETH <10 11/04/2017    Metabolic Disorder Labs: Lab Results  Component Value Date    HGBA1C 8.5 (H) 11/05/2017   MPG 197.25 11/05/2017   MPG 220.21 10/13/2017   No results found for: PROLACTIN Lab Results  Component Value Date   CHOL 156 11/05/2017   TRIG 121 11/05/2017   HDL 44 11/05/2017   CHOLHDL 3.5 11/05/2017   VLDL 24 11/05/2017   LDLCALC 88 11/05/2017   LDLCALC 92 07/30/2017    Physical Findings: AIMS: Facial and Oral Movements Muscles of Facial Expression: None, normal Lips and Perioral Area: None, normal Jaw: None, normal Tongue: None, normal,Extremity Movements Upper (arms, wrists, hands, fingers): None, normal Lower (legs, knees, ankles, toes): None, normal, Trunk Movements Neck, shoulders, hips: None, normal, Overall Severity Severity of abnormal movements (highest score from questions above): None, normal Incapacitation due to abnormal movements: None, normal Patient's awareness of abnormal movements (rate only patient's report): No Awareness, Dental Status Current problems with teeth and/or dentures?: No Does patient usually wear dentures?: No  CIWA:    COWS:     Musculoskeletal: Strength & Muscle Tone: within normal limits Gait & Station: normal Patient leans: N/A  Psychiatric Specialty Exam: Physical Exam  Nursing note and vitals reviewed.   Review of Systems  Eyes: Positive for photophobia and pain.  All other systems reviewed and are negative.   Blood pressure (!) 120/42, pulse 87, temperature 97.9 F (36.6 C), temperature source Oral, resp. rate 18, height 5\' 7"  (1.702 m), weight (!) 158.3 kg (349 lb), last menstrual period 11/03/2017.Body mass index is 54.66 kg/m.  General Appearance: Casual  Eye Contact:  Poor  Speech:  Clear and Coherent  Volume:  Normal  Mood:  Anxious  Affect:  Congruent  Thought Process:  Goal Directed  Orientation:  Full (Time, Place, and Person)  Thought Content:  Negative  Suicidal Thoughts:  No  Homicidal Thoughts:  No  Memory:  Immediate;   Fair  Judgement:  Fair  Insight:  Fair  Psychomotor  Activity:  Normal  Concentration:  Attention Span: Good  Recall:  [employer]Fair  Fund of Knowledge:  Fair  Language:  Fair  Akathisia:  No      Assets:  Communication Skills Desire for Improvement Housing Resilience Social Support  ADL's:  Intact  Cognition:  WNL  Sleep:  Number of Hours: 6.3     Treatment Plan Summary: 36 yo female admitted due to SI and HI.Mood is improving and SI and HI are resolving.   Plan:  MDD -Continue Lexapro 10 mg daily  Anxiety -Gabapentin 300 mg BID -Prn hydroxyzine  DM -DM care manager on board and will make adjustments today if needed -Levemir 50 units qhs -Decrease Novolog to 9 units TID -sliding scale -Metformin  HLD -Prvastatin  Macular Degeneration -Ophthalmology consulted and will see her today  Dispo -Pt will return home with husband and follow up with [facility]. Tentative discharge tomorrow 11/13/17  Haskell RilingHolly R Amiah Frohlich, MD 11/12/2017, 10:24 AM

## 2017-11-12 NOTE — Progress Notes (Signed)
Recreation Therapy Notes  Date: 12.12.2018  Time: 1:00pm   Location: Craft Room  Behavioral response: Appropriate  Intervention Topic: Coping Skills  Discussion/Intervention: Patient did not attend group. Clinical Observations/Feedback:  Patient did not attend group. Asma Boldon LRT/CTRS          Herny Scurlock 11/12/2017 2:10 PM

## 2017-11-12 NOTE — Progress Notes (Signed)
Patient ID: Rachel George, female   DOB: March 01, 1981, 36 y.o.   MRN: [id number] Recovered from hypoglycemic event, still depressed and hopeless: medical problems including diabetes' complications, unemployment, financial hardship; patient was encouraged to focus here and now.

## 2017-11-13 LAB — GLUCOSE, CAPILLARY
Glucose-Capillary: 106 mg/dL — ABNORMAL HIGH (ref 65–99)
Glucose-Capillary: 168 mg/dL — ABNORMAL HIGH (ref 65–99)

## 2017-11-13 MED ORDER — INSULIN ASPART 100 UNIT/ML ~~LOC~~ SOLN: 9.0000 [IU] | Freq: Three times a day (TID) | SUBCUTANEOUS | 11 refills | Status: DC

## 2017-11-13 MED ORDER — INSULIN DETEMIR 100 UNIT/ML ~~LOC~~ SOLN: 50.0000 [IU] | Freq: Every day | SUBCUTANEOUS | 11 refills | Status: DC

## 2017-11-13 NOTE — [facility] Suicide Risk Assessment (Signed)
[facility]   Principal Problem: Major depressive disorder, single episode, severe without psychosis (HCC) Discharge Diagnoses:  Patient Active Problem List   Diagnosis Date Noted  . Major depressive disorder, single episode, severe without psychosis (HCC) [F32.2] 11/04/2017  . Suicidal ideation [R45.851] 11/04/2017  . 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 Uncontrolled type 2 diabetes mellitus with hyperglycemia, with long-term current use of insulin (HCC) [E11.65, Z79.4] 05/01/2017  . Obesity, Class III, BMI 40-49.9 (morbid obesity) (HCC) [E66.01] 05/01/2017  . Morbid obesity with BMI of 50.0-59.9, adult (HCC) [E66.01, Z68.43] 05/01/2017  . Diabetic retinopathy associated with type 2 diabetes mellitus (HCC) [E11.319] 03/18/2017  . S/P amputation [Z89.9] 02/06/2017  . Osteomyelitis (HCC) [M86.9] 12/13/2016  . Diabetic foot ulcer (HCC) [Z61.096[E11.621, L97.509] 10/15/2016  . Cellulitis of toe of right foot [L03.031] 10/07/2016  . Uncontrolled type 2 diabetes mellitus with complication (HCC) [E11.8, E11.65] 09/09/2016  . Morbid obesity (HCC) [E66.01] 09/09/2016  . Diabetic polyneuropathy associated with type 2 diabetes mellitus (HCC) [E11.42] 09/09/2016    Total Time spent with patient: 20 minutes  Mental Status Per Nursing Assessment::   On Admission:     Demographic Factors:  NA  Loss Factors: Decline in physical health  Historical Factors: NA  Risk Reduction Factors:   Sense of responsibility to family, Living with another person, especially a relative, Positive social support, Positive therapeutic relationship and Positive coping skills or problem solving skills  Continued Clinical Symptoms:  Anxiety regarding health issues and declining vision  Cognitive Features That Contribute To Risk:  None    Suicide Risk:  Minimal: No identifiable suicidal ideation.  Patients presenting with no risk factors but with morbid ruminations; may  be classified as minimal risk based on the severity of the depressive symptoms  Follow-up Information    [facility], [facility] Follow up.   Contact information: [address] [place] [id number] [phone]        [facility] Follow up.   Why:  Next available apt in January 2018.  Walk-in only on Monday 9-12, Tues 12-3, Thurs 8-11.  Will need to bring proof of income and household size. Contact information: [address] [place] [address] [phone]        [facility].   Contact information: [address] [place] [address] [phone]           Plan Of Care/Follow-up recommendations:  See above  Haskell RilingHolly R Tomoko Sandra, MD 11/13/2017, 8:22 AM

## 2017-11-13 NOTE — Progress Notes (Signed)
Patient ID: Rachel George, female   DOB: 1981-05-18, 36 y.o.   MRN: [id number] A&Ox3, c/o 5/10 Right knee aching chronic pain and discomfort; Ibuprofen 400 mg given, also received Trazodone (sleep) and hydroxyzine (anxiety) at bedtime; less depressed, hoping to see her eye doctor once discharged back home; mood and affect still flat, denied SI/HI/AVH.

## 2017-11-13 NOTE — Progress Notes (Signed)
Patient ID: Rachel George, female   DOB: [birth year], 36 y.o.   MRN: [id number]  Discharge Note:  Patient denies SI/HI at this time. Discharge instructions, AVS, prescriptions and transition record gone over with patient. Patient belongings returned to patient. Patient agrees to comply with medication management, follow-up visit, and outpatient therapy. Patient questions and concerns addressed and answered. Patient discharged to home with husband.

## 2017-11-13 NOTE — Progress Notes (Signed)
Recreation Therapy Notes  INPATIENT RECREATION TR PLAN  Patient Details Name: Rachel George MRN: [id number] DOB: 1981/06/18 Today's Date: 11/13/2017  Rec Therapy Plan Is patient appropriate for Therapeutic Recreation?: Yes Treatment times per week: at least 3 Estimated Length of Stay: 5-7 days TR Treatment/Interventions: Group participation (Comment)(Appropriate participation in recreation therapy tx. )  Discharge Criteria Pt will be discharged from therapy if:: Discharged Treatment plan/goals/alternatives discussed and agreed upon by:: Patient/family  Discharge Summary Short term goals set: Patient will identify 3 benefits of self-care x5 days.  Short term goals met: Complete Reason goals not met: N/A Therapeutic equipment acquired: N/A Reason patient discharged from therapy: Discharge from hospital Pt/family agrees with progress & goals achieved: Yes Date patient discharged from therapy: 11/13/17   Kynzie Polgar 11/13/2017, 4:19 PM

## 2017-11-13 NOTE — [facility] Group Notes (Signed)
[facility] LCSW Group Therapy Note  Date/Time: 11/13/17, 0930  Type of Therapy/Topic:  Group Therapy:  Balance in Life  Participation Level:  Did not attend  Description of Group:    This group will address the concept of balance and how it feels and looks when one is unbalanced. Patients will be encouraged to process areas in their lives that are out of balance, and identify reasons for remaining unbalanced. Facilitators will guide patients utilizing problem- solving interventions to address and correct the stressor making their life unbalanced. Understanding and applying boundaries will be explored and addressed for obtaining  and maintaining a balanced life. Patients will be encouraged to explore ways to assertively make their unbalanced needs known to significant others in their lives, using other group members and facilitator for support and feedback.  Therapeutic Goals: 1. Patient will identify two or more emotions or situations they have that consume much of in their lives. 2. Patient will identify signs/triggers that life has become out of balance:  3. Patient will identify two ways to set boundaries in order to achieve balance in their lives:  4. Patient will demonstrate ability to communicate their needs through discussion and/or role plays  Summary of Patient Progress:          Therapeutic Modalities:   Cognitive Behavioral Therapy Solution-Focused Therapy Assertiveness Training  Greg Jolanta Cabeza, LCSW 

## 2017-11-13 NOTE — Discharge Summary (Signed)
Physician Discharge Summary Note  Patient:  Rachel George is an 36 y.o., female MRN:  [id number] DOB:  03/04/1981 Patient phone:  [phone] (home)  Patient address:   [address] [place] [address],  Total Time spent with patient: 20 minutes  Plus 20 minutes of medication reconciliation, discharge planning, and discahrge documentation  Date of Admission:  11/04/2017 Date of Discharge: 11/13/17  Reason for Admission:  Suicidal  Principal Problem: Major depressive disorder, single episode, severe without psychosis [facility]) Discharge Diagnoses: Patient Active Problem List   Diagnosis Date Noted  . Major depressive disorder, single episode, severe without psychosis (HCC) [F32.2] 11/04/2017  . Suicidal ideation [R45.851] 11/04/2017  . 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 Uncontrolled type 2 diabetes mellitus with hyperglycemia, with long-term current use of insulin (HCC) [E11.65, Z79.4] 05/01/2017  . Obesity, Class III, BMI 40-49.9 (morbid obesity) (HCC) [E66.01] 05/01/2017  . Morbid obesity with BMI of 50.0-59.9, adult (HCC) [E66.01, Z68.43] 05/01/2017  . Diabetic retinopathy associated with type 2 diabetes mellitus (HCC) [E11.319] 03/18/2017  . S/P amputation [Z89.9] 02/06/2017  . Osteomyelitis (HCC) [M86.9] 12/13/2016  . Diabetic foot ulcer (HCC) [I69.629, L97.509] 10/15/2016  . Cellulitis of toe of right foot [L03.031] 10/07/2016  . Uncontrolled type 2 diabetes mellitus with complication (HCC) [E11.8, E11.65] 09/09/2016  . Morbid obesity (HCC) [E66.01] 09/09/2016  . Diabetic polyneuropathy associated with type 2 diabetes mellitus (HCC) [E11.42] 09/09/2016    Past Psychiatric History: See H&P  Past Medical History:  Past Medical History:  Diagnosis Date  . Cataract   . Diabetes mellitus without complication (HCC)    dx age 39  . Fatty liver disease, nonalcoholic   . Macular degeneration   . Partial blindness     Past Surgical  History:  Procedure Laterality Date  . AMPUTATION TOE Right 12/16/2016   Procedure: RIGHT FIRST TOE AMPUTATION;  Surgeon: Ancil Linsey, MD;  Location: [facility];  Service: General;  Laterality: Right;  Right first toe amputation for osteomyelitis  . eye lid transplant    . RETINAL LASER PROCEDURE     Family History:  Family History  Problem Relation Age of Onset  . Diabetes Father    Family Psychiatric  History: See H&P Social History:  Social History   Substance and Sexual Activity  Alcohol Use No     Social History   Substance and Sexual Activity  Drug Use No    Social History   Socioeconomic History  . Marital status: Married    Spouse name: Willadeen Colantuono  . Number of children: None  . Years of education: None  . Highest education level: None  Social Needs  . Financial resource strain: None  . Food insecurity - worry: None  . Food insecurity - inability: None  . Transportation needs - medical: None  . Transportation needs - non-medical: None  Occupational History  . Occupation: CNA personal care giver  Tobacco Use  . Smoking status: Former Smoker    Packs/day: 0.25    Types: Cigarettes    Last attempt to quit: 12/11/2016    Years since quitting: 0.9  . Smokeless tobacco: Never Used  Substance and Sexual Activity  . Alcohol use: No  . Drug use: No  . Sexual activity: Yes    Partners: Male    Birth control/protection: None  Other Topics Concern  . None  Social History Narrative  . None    Hospital Course:  Pt was started on lexapro  for depression. She was also started on hydroxyzine prn for anxiety and trazodone for insomnia. She tolerated these medciations well. She was complaining of eye pain and was seen by ophlamology while on the unit. Dr. Brooke DareKing did not see any acute findings and recommended her to follow up with her outpatient physician. This made patient feel less anxious. Pt attended groups and participated well. She has good insight into her self  and things she needs to do. On day of discharge, she had brighter affect. She states that she is trying to think more positively about things and how to live as independently as possible with her cahnging vision but also be okay with help from others. She denied SI or any thoughts of self harm. She denied HI and was on better terms with her husband. They spoke almost daily and he is more supportive and they plan to work together. Pt stated that she felt safe and ready for discharge. She was given 7 day supply of medications from our pharmacy. Safey plan discussed and she will return to ED if she were to have thoughts or harming herself or others.   The patient is at low risk of imminent suicide. Patient denied thoughts, intent, or plan for harm to self or others, expressed significant future orientation, and expressed an ability to mobilize assistance for her needs. She is presently void of any contributing psychiatric symptoms, cognitive difficulties, or substance use which would elevate her risk for lethality. Chronic risk for lethality is elevated in light of chronic medical problems. The chronic risk is presently mitigated by her ongoing [facility] and [facility] and mobilization of support from family and friends. Chronic risk may elevate if she experiences any significant loss or worsening of symptoms, which can be managed and monitored through outpatient providers. At this time,a cute risk for lethality is low and she is stable for ongoing outpatient management.   Modifiable risk factors were addressed during this hospitalization through appropriate pharmacotherapy and establishment of outpatient follow-up treatment. Some risk factors for suicide are situational (i.e. Unstable housing) or related personality pathology (i.e. Poor coping mechanisms) and thus cannot be further mitigated by continued hospitalization in this setting.    Physical Findings: AIMS: Facial and Oral  Movements Muscles of Facial Expression: None, normal Lips and Perioral Area: None, normal Jaw: None, normal Tongue: None, normal,Extremity Movements Upper (arms, wrists, hands, fingers): None, normal Lower (legs, knees, ankles, toes): None, normal, Trunk Movements Neck, shoulders, hips: None, normal, Overall Severity Severity of abnormal movements (highest score from questions above): None, normal Incapacitation due to abnormal movements: None, normal Patient's awareness of abnormal movements (rate only patient's report): No Awareness, Dental Status Current problems with teeth and/or dentures?: No Does patient usually wear dentures?: No  CIWA:    COWS:     Musculoskeletal: Strength & Muscle Tone: within normal limits Gait & Station: normal Patient leans: N/A  Psychiatric Specialty Exam: Physical Exam  Nursing note and vitals reviewed.   Review of Systems  All other systems reviewed and are negative.   Blood pressure (!) 120/42, pulse 87, temperature 97.9 F (36.6 C), temperature source Oral, resp. rate 18, height 5\' 7"  (1.702 m), weight (!) 158.3 kg (349 lb), last menstrual period 11/03/2017.Body mass index is 54.66 kg/m.  General Appearance: Casual  Eye Contact:  Minimal  Speech:  Clear and Coherent  Volume:  Normal  Mood:  Euthymic  Affect:  Congruent  Thought Process:  Organized  Orientation:  Full (Time,  Place, and Person)  Thought Content:  Negative  Suicidal Thoughts:  No  Homicidal Thoughts:  No  Memory:  Immediate;   Fair  Judgement:  Fair  Insight:  Fair  Psychomotor Activity:  Normal  Concentration:  Concentration: Fair  Recall:  Fair  Fund of Knowledge:  Fair  Language:  Fair  Akathisia:  No      Assets:  Communication Skills Desire for Improvement Housing Social Support  ADL's:  Intact  Cognition:  WNL  Sleep:  Number of Hours: 7        Has this patient used any form of tobacco in the last 30 days? (Cigarettes, Smokeless Tobacco, Cigars,  and/or Pipes) No  Blood Alcohol level:  Lab Results  Component Value Date   ETH <10 11/04/2017    Metabolic Disorder Labs:  Lab Results  Component Value Date   HGBA1C 8.5 (H) 11/05/2017   MPG 197.25 11/05/2017   MPG 220.21 10/13/2017   No results found for: PROLACTIN Lab Results  Component Value Date   CHOL 156 11/05/2017   TRIG 121 11/05/2017   HDL 44 11/05/2017   CHOLHDL 3.5 11/05/2017   VLDL 24 11/05/2017   LDLCALC 88 11/05/2017   LDLCALC 92 07/30/2017    See Psychiatric Specialty Exam and Suicide Risk Assessment completed by Attending Physician prior to discharge.  Discharge destination:  Home  Is patient on multiple antipsychotic therapies at discharge:  No   Has Patient had three or more failed trials of antipsychotic monotherapy by history:  No  Recommended Plan for Multiple Antipsychotic Therapies: NA   Allergies as of 11/13/2017      Reactions   Shellfish Allergy Anaphylaxis, Shortness Of Breath, Swelling   Facial Swelling   Percocet [oxycodone-acetaminophen] Itching, Nausea And Vomiting      Medication List    STOP taking these medications   acetaminophen 500 MG tablet Commonly known as:  TYLENOL   bismuth subsalicylate 262 MG/15ML suspension Commonly known as:  PEPTO BISMOL   sitaGLIPtin 100 MG tablet Commonly known as:  JANUVIA     TAKE these medications     Indication  albuterol 108 (90 Base) MCG/ACT inhaler Commonly known as:  PROVENTIL HFA;VENTOLIN HFA Inhale 2 puffs into the lungs every 6 (six) hours as needed for wheezing or shortness of breath.  Indication:  Asthma, Chronic Obstructive Lung Disease   atropine 1 % ophthalmic solution Place 1 drop into the right eye 3 (three) times daily.  Indication:  Macular Degeneration   bacitracin-polymyxin b ophthalmic ointment Commonly known as:  POLYSPORIN Place 1 application into the right eye 4 (four) times daily.  Indication:  Macular degen   brimonidine 0.2 % ophthalmic  solution Commonly known as:  ALPHAGAN Place 1 drop into the right eye every 8 (eight) hours.  Indication:  macular degen   dorzolamide 2 % ophthalmic solution Commonly known as:  TRUSOPT Place 1 drop into the right eye 3 (three) times daily.  Indication:  macular degen   escitalopram 10 MG tablet Commonly known as:  LEXAPRO Take 1 tablet (10 mg total) by mouth daily.  Indication:  Major Depressive Disorder   gabapentin 300 MG capsule Commonly known as:  NEURONTIN Take 1 capsule (300 mg total) by mouth 2 (two) times daily. What changed:  Another medication with the same name was removed. Continue taking this medication, and follow the directions you see here.  Indication:  Neuropathic Pain   hydrOXYzine 50 MG tablet Commonly known as:  ATARAX/VISTARIL Take  1 tablet (50 mg total) by mouth 3 (three) times daily as needed for anxiety.  Indication:  Feeling Anxious   insulin aspart 100 UNIT/ML injection Commonly known as:  novoLOG Inject 9 Units into the skin 3 (three) times daily before meals. What changed:    how much to take  additional instructions  Indication:  Type 2 Diabetes   insulin detemir 100 UNIT/ML injection Commonly known as:  LEVEMIR Inject 0.5 mLs (50 Units total) into the skin at bedtime. Reported on 04/16/2016 What changed:  how much to take  Indication:  Type 2 Diabetes   lovastatin 20 MG tablet Commonly known as:  MEVACOR Take 1 tablet (20 mg total) by mouth at bedtime.  Indication:  High Amount of Fats in the Blood   lovastatin 20 MG tablet Commonly known as:  MEVACOR Take 1 tablet by mouth at bedtime.  Indication:  High Amount of Fats in the Blood   metFORMIN 500 MG tablet Commonly known as:  GLUCOPHAGE Take 1 tablet (500 mg total) by mouth 2 (two) times daily with a meal.  Indication:  Type 2 Diabetes   moxifloxacin 0.5 % ophthalmic solution Commonly known as:  VIGAMOX Place 1 drop into the right eye 4 (four) times daily.  Indication:   Macular degen   prednisoLONE acetate 1 % ophthalmic suspension Commonly known as:  PRED FORTE Place 1 drop into the right eye 4 (four) times daily.  Indication:  Macular degen   timolol 0.5 % ophthalmic solution Commonly known as:  BETIMOL Place 1 drop into the right eye 2 (two) times daily.  Indication:  Macular Degen   traZODone 100 MG tablet Commonly known as:  DESYREL Take 1 tablet (100 mg total) by mouth at bedtime as needed for sleep.  Indication:  Trouble Sleeping      Follow-up Information    [facility] Follow up.   Contact information: [address] [place] [address] [phone]        [facility].   Why:  Next available apt in January 2019.  Walk-ins only on Monday 9-12, Tues 12-3, Thurs 8-11.  Will need to bring proof of income and household size. Contact information: [address] [place] [address] [phone]           Follow-up recommendations:  Follow up with [facility]  Signed: Haskell Riling, MD 11/13/2017, 9:19 AM

## 2017-11-13 NOTE — Progress Notes (Signed)
Patient ID: Rachel George, female   DOB: 1981-01-09, 36 y.o.   MRN: [id number]   [facility] [place] [phone]) multiple times today in an attempt to schedule pt a f/u appt prior to her discharge from the [facility].  CSW was unable to get anyone on the line before pt's discharge today.  CSW was made aware that this office has been closed since Monday, November 10, 2017 due to inclement weather and may be still closed.  CSW will continue to try to get in touch with provider in order to make pt a f/u appt and will contact pt with appt information.  [facility] as [facility].

## 2017-11-13 NOTE — Plan of Care (Signed)
Patient slept for Estimated Hours of 7; Precautionary checks every 15 minutes for safety maintained, room free of safety hazards, patient sustains no injury or falls during this shift.  

## 2017-11-13 NOTE — [facility] Group Notes (Signed)
LCSW Group Therapy Note 11/13/2017 9:00AM  Type of Therapy and Topic:  Group Therapy:  Setting Goals  Participation Level:  Did Not Attend  Description of Group: In this process group, patients discussed using strengths to work toward goals and address challenges.  Patients identified two positive things about themselves and one goal they were working on.  Patients were given the opportunity to share openly and support each other's plan for self-empowerment.  The group discussed the value of gratitude and were encouraged to have a daily reflection of positive characteristics or circumstances.  Patients were encouraged to identify a plan to utilize their strengths to work on current challenges and goals.  Therapeutic Goals 1. Patient will verbalize personal strengths/positive qualities and relate how these can assist with achieving desired personal goals 2. Patients will verbalize affirmation of peers plans for personal change and goal setting 3. Patients will explore the value of gratitude and positive focus as related to successful achievement of goals 4. Patients will verbalize a plan for regular reinforcement of personal positive qualities and circumstances.  Summary of Patient Progress:       Therapeutic Modalities Cognitive Behavioral Therapy Motivational Interviewing    Rachel George, Rachel MtLCSW 11/13/2017 12:39 PM

## 2017-12-15 ENCOUNTER — Ambulatory Visit: Payer: Self-pay | Admitting: Physician Assistant

## 2017-12-18 ENCOUNTER — Ambulatory Visit: Payer: No Typology Code available for payment source | Admitting: "Endocrinology

## 2017-12-22 ENCOUNTER — Encounter: Payer: Self-pay | Admitting: Physician Assistant

## 2018-01-13 DIAGNOSIS — H3342 Traction detachment of retina, left eye: Secondary | ICD-10-CM | POA: Insufficient documentation

## 2018-01-15 ENCOUNTER — Ambulatory Visit: Payer: No Typology Code available for payment source | Admitting: "Endocrinology

## 2018-01-16 ENCOUNTER — Ambulatory Visit: Payer: No Typology Code available for payment source | Admitting: Podiatry

## 2018-01-20 ENCOUNTER — Ambulatory Visit: Payer: Self-pay | Admitting: Physician Assistant

## 2018-02-11 ENCOUNTER — Ambulatory Visit: Payer: Self-pay | Admitting: Family Medicine

## 2018-02-16 ENCOUNTER — Ambulatory Visit: Payer: No Typology Code available for payment source | Admitting: "Endocrinology

## 2018-02-19 ENCOUNTER — Telehealth: Payer: Self-pay | Admitting: Family Medicine

## 2018-02-19 NOTE — Telephone Encounter (Signed)
Please advise 

## 2018-02-19 NOTE — Telephone Encounter (Signed)
Patient called in to request her appt date, I let her know that she missed her appt on 02/11/18.  She is aware of the office policy (Nocall/Noshow - are not to r/s) She states she had eye surgery that day and so much going on she thought it was 02/20/18. Is this ok to reschedule due to the circumstances or no ? Cb#: [id number]

## 2018-02-20 NOTE — Telephone Encounter (Signed)
Sent to office manager to advise. Janine Limboachel H. Tracie HarrierHagler, MD

## 2018-02-20 NOTE — Telephone Encounter (Signed)
Left msg that we will not re-schedule this new patient appt that was a no-show.

## 2018-02-27 ENCOUNTER — Ambulatory Visit: Payer: Medicaid Other | Admitting: Podiatry

## 2018-06-30 ENCOUNTER — Other Ambulatory Visit: Payer: Self-pay

## 2018-06-30 ENCOUNTER — Emergency Department (HOSPITAL_COMMUNITY)
Admission: EM | Admit: 2018-06-30 | Discharge: 2018-06-30 | Disposition: A | Discharge status: Home / Self Care | Payer: Medicaid Other | Attending: Emergency Medicine | Admitting: Emergency Medicine

## 2018-06-30 ENCOUNTER — Encounter (HOSPITAL_COMMUNITY): Payer: Self-pay | Admitting: Emergency Medicine

## 2018-06-30 DIAGNOSIS — Z79899 Other long term (current) drug therapy: Secondary | ICD-10-CM | POA: Insufficient documentation

## 2018-06-30 DIAGNOSIS — Z87891 Personal history of nicotine dependence: Secondary | ICD-10-CM | POA: Insufficient documentation

## 2018-06-30 DIAGNOSIS — Z794 Long term (current) use of insulin: Secondary | ICD-10-CM | POA: Diagnosis not present

## 2018-06-30 DIAGNOSIS — F329 Major depressive disorder, single episode, unspecified: Secondary | ICD-10-CM | POA: Insufficient documentation

## 2018-06-30 DIAGNOSIS — H60332 Swimmer's ear, left ear: Secondary | ICD-10-CM | POA: Insufficient documentation

## 2018-06-30 DIAGNOSIS — H9202 Otalgia, left ear: Secondary | ICD-10-CM | POA: Diagnosis present

## 2018-06-30 DIAGNOSIS — E119 Type 2 diabetes mellitus without complications: Secondary | ICD-10-CM | POA: Insufficient documentation

## 2018-06-30 MED ORDER — ANTIPYRINE-BENZOCAINE 5.4-1.4 % OT SOLN: 3.0000 [drp] | OTIC | 0 refills | Status: DC | PRN

## 2018-06-30 MED ORDER — IBUPROFEN 800 MG PO TABS
800.0000 mg | ORAL_TABLET | Freq: Once | ORAL | Status: AC
  Administered 2018-06-30: 800 mg via ORAL
  Filled 2018-06-30: qty 1

## 2018-06-30 MED ORDER — NEOMYCIN-POLYMYXIN-HC 3.5-10000-1 OT SUSP
OTIC | Status: AC
  Filled 2018-06-30: qty 10

## 2018-06-30 MED ORDER — IBUPROFEN 600 MG PO TABS: 600.0000 mg | ORAL_TABLET | Freq: Four times a day (QID) | ORAL | 0 refills | Status: DC | PRN

## 2018-06-30 MED ORDER — NEOMYCIN-POLYMYXIN-HC 1 % OT SOLN
4.0000 [drp] | Freq: Once | OTIC | Status: AC
  Administered 2018-06-30: 4 [drp] via OTIC
  Filled 2018-06-30: qty 10

## 2018-06-30 NOTE — ED Provider Notes (Signed)
[facility] EMERGENCY DEPARTMENT Provider Note   CSN: [id number] Arrival date & time: 06/30/18  0941     History   Chief Complaint Chief Complaint  Patient presents with  . Otalgia    HPI Analy Rachel George is a 37 y.o. female.  The history is provided by the patient.  Otalgia  This is a new problem. Episode onset: 1 week. There is pain in the left ear. The problem occurs constantly. The problem has been gradually worsening. There has been no fever. Associated symptoms include ear discharge. Pertinent negatives include no headaches, no hearing loss, no rhinorrhea, no sore throat, no neck pain and no cough. Associated symptoms comments: Reports can hear fluid. Her past medical history does not include chronic ear infection.    Past Medical History:  Diagnosis Date  . Cataract   . Diabetes mellitus without complication (HCC)    dx age 69  . Fatty liver disease, nonalcoholic   . Macular degeneration   . Partial blindness     Patient Active Problem List   Diagnosis Date Noted  . Major depressive disorder, single episode, severe without psychosis (HCC) 11/04/2017  . Suicidal ideation 11/04/2017  . 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 Uncontrolled type 2 diabetes mellitus with hyperglycemia, with long-term current use of insulin (HCC) 05/01/2017  . Obesity, Class III, BMI 40-49.9 (morbid obesity) (HCC) 05/01/2017  . Morbid obesity with BMI of 50.0-59.9, adult (HCC) 05/01/2017  . Diabetic retinopathy associated with type 2 diabetes mellitus (HCC) 03/18/2017  . 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  . Osteomyelitis (HCC) 12/13/2016  . Diabetic foot ulcer (HCC) 10/15/2016  . Cellulitis of toe of right foot 10/07/2016  . Uncontrolled type 2 diabetes mellitus with complication (HCC) 09/09/2016  . Morbid obesity (HCC) 09/09/2016  . Diabetic polyneuropathy associated with type 2 diabetes mellitus (HCC) 09/09/2016    Past Surgical History:  Procedure Laterality Date  .  AMPUTATION TOE Right 12/16/2016   Procedure: RIGHT FIRST TOE AMPUTATION;  Surgeon: Rachel Linsey, MD;  Location: [facility];  Service: General;  Laterality: Right;  Right first toe amputation for osteomyelitis  . eye lid transplant    . RETINAL LASER PROCEDURE       OB History    Gravida  0   Para  0   Term  0   Preterm  0   AB  0   Living  0     SAB  0   TAB  0   Ectopic  0   Multiple  0   Live Births  0            Home Medications    Prior to Admission medications   Medication Sig Start Date End Date Taking? Authorizing Provider  albuterol (PROVENTIL HFA;VENTOLIN HFA) 108 (90 Base) MCG/ACT inhaler Inhale 2 puffs into the lungs every 6 (six) hours as needed for wheezing or shortness of breath.    [provider]  antipyrine-benzocaine Rachel George) OTIC solution Place 3-4 drops into the right ear every 2 (two) hours as needed for ear pain. Compounded solution of 1% hydrocortisone and 2% benzocaine compounded solution. 06/30/18   Rachel Amor, PA-C  atropine 1 % ophthalmic solution Place 1 drop into the right eye 3 (three) times daily. 10/14/17   [provider]  bacitracin-polymyxin b (POLYSPORIN) ophthalmic ointment Place 1 application into the right eye 4 (four) times daily. 10/14/17   [provider]  brimonidine (ALPHAGAN) 0.2 % ophthalmic solution Place 1 drop  into the right eye every 8 (eight) hours. 10/14/17   [provider]  dorzolamide (TRUSOPT) 2 % ophthalmic solution Place 1 drop into the right eye 3 (three) times daily. 10/14/17   [provider]  escitalopram (LEXAPRO) 10 MG tablet Take 1 tablet (10 mg total) by mouth daily. 11/13/17   Rachel George, Rachel HutchinsonHolly R, MD  gabapentin (NEURONTIN) 300 MG capsule Take 1 capsule (300 mg total) by mouth 2 (two) times daily. 03/18/17   Rachel HawkingMcElroy, Shannon, PA-C  hydrOXYzine (ATARAX/VISTARIL) 50 MG tablet Take 1 tablet (50 mg total) by mouth 3 (three) times daily as needed for anxiety. 11/12/17    Rachel George, Rachel HutchinsonHolly R, MD  ibuprofen (ADVIL,MOTRIN) 600 MG tablet Take 1 tablet (600 mg total) by mouth every 6 (six) hours as needed. 06/30/18   Rachel AmorIdol, Rachel Burlison, PA-C  insulin aspart (NOVOLOG) 100 UNIT/ML injection Inject 9 Units into the skin 3 (three) times daily before meals. 11/13/17   Rachel George, Rachel HutchinsonHolly R, MD  insulin detemir (LEVEMIR) 100 UNIT/ML injection Inject 0.5 mLs (50 Units total) into the skin at bedtime. Reported on 04/16/2016 11/13/17   Rachel RilingMcNew, Rachel R, MD  lovastatin (MEVACOR) 20 MG tablet Take 1 tablet (20 mg total) by mouth at bedtime. 04/24/17   Rachel HawkingMcElroy, Shannon, PA-C  lovastatin (MEVACOR) 20 MG tablet Take 1 tablet by mouth at bedtime. 04/24/17   [provider]  metFORMIN (GLUCOPHAGE) 500 MG tablet Take 1 tablet (500 mg total) by mouth 2 (two) times daily with a meal. 02/06/17   Rachel HawkingMcElroy, Shannon, PA-C  moxifloxacin (VIGAMOX) 0.5 % ophthalmic solution Place 1 drop into the right eye 4 (four) times daily.    [provider]  prednisoLONE acetate (PRED FORTE) 1 % ophthalmic suspension Place 1 drop into the right eye 4 (four) times daily. 10/14/17   [provider]  timolol (BETIMOL) 0.5 % ophthalmic solution Place 1 drop into the right eye 2 (two) times daily. 10/14/17   [provider]  traZODone (DESYREL) 100 MG tablet Take 1 tablet (100 mg total) by mouth at bedtime as needed for sleep. 11/12/17   Rachel George, Rachel HutchinsonHolly R, MD    Family History Family History  Problem Relation Age of Onset  . Diabetes Father     Social History Social History   Tobacco Use  . Smoking status: Former Smoker    Packs/day: 0.25    Types: Cigarettes    Last attempt to quit: 12/11/2016    Years since quitting: 1.5  . Smokeless tobacco: Never Used  Substance Use Topics  . Alcohol use: No  . Drug use: No     Allergies   Shellfish allergy and Percocet [oxycodone-acetaminophen]   Review of Systems Review of Systems  HENT: Positive for ear discharge and ear pain. Negative for  hearing loss, rhinorrhea and sore throat.   Respiratory: Negative for cough.   Musculoskeletal: Negative for neck pain.  Neurological: Negative for headaches.     Physical Exam Updated Vital Signs BP (!) 146/59 (BP Location: Right Arm)   Pulse 97   Temp 98.2 F (36.8 C) (Oral)   Resp 20   Ht 5\' 7"  (1.702 m)   Wt (!) 154.2 kg (340 lb)   LMP 06/02/2018   SpO2 99%   BMI 53.25 kg/m   Physical Exam  Constitutional: She is oriented to person, place, and time. She appears well-developed and well-nourished.  HENT:  Head: Normocephalic and atraumatic.  Right Ear: Tympanic membrane and ear canal normal.  Left Ear: Ear canal normal.  There is drainage and swelling. Tympanic membrane is not erythematous.  Nose: No mucosal edema or rhinorrhea.  Mouth/Throat: Uvula is midline, oropharynx is clear and moist and mucous membranes are normal. No oropharyngeal exudate, posterior oropharyngeal edema, posterior oropharyngeal erythema or tonsillar abscesses.  Pt with external canal edema left ear, white fluid drainage.  Can partially visualize TM, occluded by fluid.  Pain with traction.  No mastoid tenderness.   Eyes: Conjunctivae are normal.  Cardiovascular: Normal rate and normal heart sounds.  Pulmonary/Chest: Effort normal. No respiratory distress. She has no wheezes. She has no rales.  Abdominal: Soft. There is no tenderness.  Musculoskeletal: Normal range of motion.  Lymphadenopathy:       Head (left side): Submandibular adenopathy present.  Neurological: She is alert and oriented to person, place, and time.  Skin: Skin is warm and dry. No rash noted.  Psychiatric: She has a normal mood and affect.     ED Treatments / Results  Labs (all labs ordered are listed, but only abnormal results are displayed) Labs Reviewed - No data to display  EKG None  Radiology No results found.  Procedures Procedures (including critical care time)  Medications Ordered in ED Medications    NEOMYCIN-POLYMYXIN-HYDROCORTISONE (CORTISPORIN) OTIC (EAR) solution 4 drop (has no administration in time range)  ibuprofen (ADVIL,MOTRIN) tablet 800 mg (has no administration in time range)     Initial Impression / Assessment and Plan / ED Course  I have reviewed the triage vital signs and the nursing notes.  Pertinent labs & imaging results that were available during my care of the patient were reviewed by me and considered in my medical decision making (see chart for details).     Cortisporin, first dose here, auralgan, ibuprofen, close f/u with pcp if sx persist or worsen.  No findings to suggest malignant otitis, can partially view TM - given pt still has escalating pain, doubt the fluid represents an otitis media with rupture. Edema not requiring ear wick at this time.  Final Clinical Impressions(s) / ED Diagnoses   Final diagnoses:  Acute swimmer's ear of left side    ED Discharge Orders        Ordered    antipyrine-benzocaine (AURALGAN) OTIC solution  Every 2 hours PRN     06/30/18 1105    ibuprofen (ADVIL,MOTRIN) 600 MG tablet  Every 6 hours PRN     06/30/18 1107       Rachel Amor, PA-C 06/30/18 1114    Gerhard Munch, MD 06/30/18 1511

## 2018-06-30 NOTE — Discharge Instructions (Signed)
Apply 4 drops of the antibiotic given in your left ear  every 6 hours and stay on your side for 5 minutes allowing the medicine to absorb before sitting upright.  Continue this drop for 10 days. You may also use the auralgan medicine prescribed if needed - this is simply a topical pain reliever.

## 2018-06-30 NOTE — ED Triage Notes (Signed)
Patient complaining of left ear pain x 1 week. States she took ibuprofen at 0500 this am.

## 2018-08-25 ENCOUNTER — Other Ambulatory Visit: Payer: Self-pay

## 2018-08-25 ENCOUNTER — Encounter (HOSPITAL_COMMUNITY): Payer: Self-pay

## 2018-08-25 ENCOUNTER — Emergency Department (HOSPITAL_COMMUNITY): Payer: Medicaid Other

## 2018-08-25 ENCOUNTER — Emergency Department (HOSPITAL_COMMUNITY)
Admission: EM | Admit: 2018-08-25 | Discharge: 2018-08-25 | Disposition: A | Discharge status: Home / Self Care | Payer: Medicaid Other | Attending: Emergency Medicine | Admitting: Emergency Medicine

## 2018-08-25 DIAGNOSIS — E11649 Type 2 diabetes mellitus with hypoglycemia without coma: Secondary | ICD-10-CM | POA: Insufficient documentation

## 2018-08-25 DIAGNOSIS — E162 Hypoglycemia, unspecified: Secondary | ICD-10-CM

## 2018-08-25 DIAGNOSIS — Z87891 Personal history of nicotine dependence: Secondary | ICD-10-CM | POA: Diagnosis not present

## 2018-08-25 DIAGNOSIS — I10 Essential (primary) hypertension: Secondary | ICD-10-CM | POA: Diagnosis not present

## 2018-08-25 DIAGNOSIS — E11319 Type 2 diabetes mellitus with unspecified diabetic retinopathy without macular edema: Secondary | ICD-10-CM | POA: Insufficient documentation

## 2018-08-25 DIAGNOSIS — Z794 Long term (current) use of insulin: Secondary | ICD-10-CM | POA: Diagnosis not present

## 2018-08-25 DIAGNOSIS — Z79899 Other long term (current) drug therapy: Secondary | ICD-10-CM | POA: Diagnosis not present

## 2018-08-25 DIAGNOSIS — R51 Headache: Secondary | ICD-10-CM | POA: Diagnosis present

## 2018-08-25 DIAGNOSIS — E1142 Type 2 diabetes mellitus with diabetic polyneuropathy: Secondary | ICD-10-CM | POA: Diagnosis not present

## 2018-08-25 DIAGNOSIS — R202 Paresthesia of skin: Secondary | ICD-10-CM

## 2018-08-25 LAB — CBC WITH DIFFERENTIAL/PLATELET
BASOS PCT: 0 %
Basophils Absolute: 0 10*3/uL (ref 0.0–0.1)
EOS ABS: 0.1 10*3/uL (ref 0.0–0.7)
EOS PCT: 1 %
HEMATOCRIT: 39.8 % (ref 36.0–46.0)
Hemoglobin: 12.8 g/dL (ref 12.0–15.0)
Lymphocytes Relative: 65 %
Lymphs Abs: 5.9 10*3/uL — ABNORMAL HIGH (ref 0.7–4.0)
MCH: 25.9 pg — ABNORMAL LOW (ref 26.0–34.0)
MCHC: 32.2 g/dL (ref 30.0–36.0)
MCV: 80.6 fL (ref 78.0–100.0)
MONO ABS: 0.6 10*3/uL (ref 0.1–1.0)
MONOS PCT: 7 %
Neutro Abs: 2.4 10*3/uL (ref 1.7–7.7)
Neutrophils Relative %: 27 %
Platelets: 265 10*3/uL (ref 150–400)
RBC: 4.94 MIL/uL (ref 3.87–5.11)
RDW: 15 % (ref 11.5–15.5)
WBC: 8.9 10*3/uL (ref 4.0–10.5)

## 2018-08-25 LAB — BASIC METABOLIC PANEL
Anion gap: 7 (ref 5–15)
BUN: 16 mg/dL (ref 6–20)
CALCIUM: 8.9 mg/dL (ref 8.9–10.3)
CO2: 21 mmol/L — AB (ref 22–32)
CREATININE: 0.97 mg/dL (ref 0.44–1.00)
Chloride: 109 mmol/L (ref 98–111)
GFR calc Af Amer: >60 mL/min (ref >60)
GFR calc non Af Amer: >60 mL/min (ref >60)
GLUCOSE: 71 mg/dL (ref 70–99)
Potassium: 3.9 mmol/L (ref 3.5–5.1)
Sodium: 137 mmol/L (ref 135–145)

## 2018-08-25 LAB — CBG MONITORING, ED
GLUCOSE-CAPILLARY: 53 mg/dL — AB (ref 70–99)
Glucose-Capillary: 52 mg/dL — ABNORMAL LOW (ref 70–99)

## 2018-08-25 NOTE — ED Notes (Signed)
Pt ambulatory to waiting room. Pt verbalized understanding of discharge instructions.   

## 2018-08-25 NOTE — ED Provider Notes (Signed)
[facility] EMERGENCY DEPARTMENT Provider Note   CSN: [id number] Arrival date & time: 08/25/18  1442     History   Chief Complaint Chief Complaint  Patient presents with  . Hypertension  . Headache    HPI Rachel George is a 37 y.o. female.  Multiple chronic medical problems including diabetes, macular degeneration, hypertension.  She presents today feeling like her blood pressure is high with complaints of headache and numbness and tingling on her right arm and right leg.  She says her right arm is been tingling for about a month and her right leg is been tingling for 3 or 4 days.  They are also painful.  Its intermittent.  She is had her blood pressure adjusted by her doctor last month.  They have also doubled her metformin due to high blood sugars and she was noted to have a blood sugar in the 50s in triage.  Sounds like he was not very symptomatic without it she had crackers and juice with improvement.  She rates the headache is moderate and throbbing she says she gets headaches fairly frequently.  Not associated with any nausea or vomiting no fevers or chills.  No chest pain or shortness of breath.  There is been no weakness in her arms or legs no bowel or bladder incontinence.  She says she is talked to her doctor about her arm numbness and pain in and he told her it was bursitis.  The history is provided by the patient.  Hypertension  This is a recurrent problem. The current episode started more than 2 days ago. The problem occurs every several days. The problem has not changed since onset.Associated symptoms include headaches. Pertinent negatives include no chest pain, no abdominal pain and no shortness of breath. Nothing aggravates the symptoms. Nothing relieves the symptoms. She has tried nothing for the symptoms. The treatment provided no relief.  Headache   This is a recurrent problem. The current episode started more than 2 days ago. The problem occurs constantly. The  problem has not changed since onset.Associated with: elevated BP. The pain is located in the bilateral region. The quality of the pain is described as dull. The pain is moderate. The pain does not radiate. Pertinent negatives include no fever, no chest pressure, no shortness of breath, no nausea and no vomiting. She has tried nothing for the symptoms. The treatment provided no relief.    Past Medical History:  Diagnosis Date  . Cataract   . Diabetes mellitus without complication (HCC)    dx age 89  . Fatty liver disease, nonalcoholic   . Macular degeneration   . Partial blindness     Patient Active Problem List   Diagnosis Date Noted  . Major depressive disorder, single episode, severe without psychosis (HCC) 11/04/2017  . Suicidal ideation 11/04/2017  . 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 Uncontrolled type 2 diabetes mellitus with hyperglycemia, with long-term current use of insulin (HCC) 05/01/2017  . Obesity, Class III, BMI 40-49.9 (morbid obesity) (HCC) 05/01/2017  . Morbid obesity with BMI of 50.0-59.9, adult (HCC) 05/01/2017  . Diabetic retinopathy associated with type 2 diabetes mellitus (HCC) 03/18/2017  . 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  . Osteomyelitis (HCC) 12/13/2016  . Diabetic foot ulcer (HCC) 10/15/2016  . Cellulitis of toe of right foot 10/07/2016  . Uncontrolled type 2 diabetes mellitus with complication (HCC) 09/09/2016  . Morbid obesity (HCC) 09/09/2016  . Diabetic polyneuropathy associated with type 2 diabetes mellitus (HCC) 09/09/2016  Past Surgical History:  Procedure Laterality Date  . AMPUTATION TOE Right 12/16/2016   Procedure: RIGHT FIRST TOE AMPUTATION;  Surgeon: Ancil LinseyJason Evan Davis, MD;  Location: [facility];  Service: General;  Laterality: Right;  Right first toe amputation for osteomyelitis  . eye lid transplant    . RETINAL LASER PROCEDURE       OB History    Gravida  0   Para  0   Term  0   Preterm  0   AB  0   Living  0     SAB  0     TAB  0   Ectopic  0   Multiple  0   Live Births  0            Home Medications    Prior to Admission medications   Medication Sig Start Date End Date Taking? Authorizing Provider  albuterol (PROVENTIL HFA;VENTOLIN HFA) 108 (90 Base) MCG/ACT inhaler Inhale 2 puffs into the lungs every 6 (six) hours as needed for wheezing or shortness of breath.    [provider]  antipyrine-benzocaine Lyla Son(AURALGAN) OTIC solution Place 3-4 drops into the right ear every 2 (two) hours as needed for ear pain. Compounded solution of 1% hydrocortisone and 2% benzocaine compounded solution. 06/30/18   Burgess AmorIdol, Julie, PA-C  atropine 1 % ophthalmic solution Place 1 drop into the right eye 3 (three) times daily. 10/14/17   [provider]  bacitracin-polymyxin b (POLYSPORIN) ophthalmic ointment Place 1 application into the right eye 4 (four) times daily. 10/14/17   [provider]  brimonidine (ALPHAGAN) 0.2 % ophthalmic solution Place 1 drop into the right eye every 8 (eight) hours. 10/14/17   [provider]  dorzolamide (TRUSOPT) 2 % ophthalmic solution Place 1 drop into the right eye 3 (three) times daily. 10/14/17   [provider]  escitalopram (LEXAPRO) 10 MG tablet Take 1 tablet (10 mg total) by mouth daily. 11/13/17   McNew, Ileene HutchinsonHolly R, MD  gabapentin (NEURONTIN) 300 MG capsule Take 1 capsule (300 mg total) by mouth 2 (two) times daily. 03/18/17   Jacquelin HawkingMcElroy, Shannon, PA-C  hydrOXYzine (ATARAX/VISTARIL) 50 MG tablet Take 1 tablet (50 mg total) by mouth 3 (three) times daily as needed for anxiety. 11/12/17   McNew, Ileene HutchinsonHolly R, MD  ibuprofen (ADVIL,MOTRIN) 600 MG tablet Take 1 tablet (600 mg total) by mouth every 6 (six) hours as needed. 06/30/18   Burgess AmorIdol, Julie, PA-C  insulin aspart (NOVOLOG) 100 UNIT/ML injection Inject 9 Units into the skin 3 (three) times daily before meals. 11/13/17   McNew, Ileene HutchinsonHolly R, MD  insulin detemir (LEVEMIR) 100 UNIT/ML injection Inject 0.5 mLs (50  Units total) into the skin at bedtime. Reported on 04/16/2016 11/13/17   Haskell RilingMcNew, Holly R, MD  lovastatin (MEVACOR) 20 MG tablet Take 1 tablet (20 mg total) by mouth at bedtime. 04/24/17   Jacquelin HawkingMcElroy, Shannon, PA-C  lovastatin (MEVACOR) 20 MG tablet Take 1 tablet by mouth at bedtime. 04/24/17   [provider]  metFORMIN (GLUCOPHAGE) 500 MG tablet Take 1 tablet (500 mg total) by mouth 2 (two) times daily with a meal. 02/06/17   Jacquelin HawkingMcElroy, Shannon, PA-C  moxifloxacin (VIGAMOX) 0.5 % ophthalmic solution Place 1 drop into the right eye 4 (four) times daily.    [provider]  prednisoLONE acetate (PRED FORTE) 1 % ophthalmic suspension Place 1 drop into the right eye 4 (four) times daily. 10/14/17   [provider]  timolol (BETIMOL) 0.5 % ophthalmic  solution Place 1 drop into the right eye 2 (two) times daily. 10/14/17   [provider]  traZODone (DESYREL) 100 MG tablet Take 1 tablet (100 mg total) by mouth at bedtime as needed for sleep. 11/12/17   McNew, Ileene Hutchinson, MD    Family History Family History  Problem Relation Age of Onset  . Diabetes Father     Social History Social History   Tobacco Use  . Smoking status: Former Smoker    Packs/day: 0.25    Types: Cigarettes    Last attempt to quit: 12/11/2016    Years since quitting: 1.7  . Smokeless tobacco: Never Used  Substance Use Topics  . Alcohol use: No  . Drug use: No     Allergies   Shellfish allergy and Percocet [oxycodone-acetaminophen]   Review of Systems Review of Systems  Constitutional: Negative for fever.  HENT: Negative for sore throat.   Eyes: Positive for visual disturbance (macular degeneration).  Respiratory: Negative for shortness of breath.   Cardiovascular: Negative for chest pain.  Gastrointestinal: Negative for abdominal pain, nausea and vomiting.  Genitourinary: Negative for dysuria.  Musculoskeletal: Negative for gait problem.  Skin: Negative for rash.  Neurological: Positive  for numbness and headaches.     Physical Exam Updated Vital Signs BP (!) 168/81 (BP Location: Right Arm)   Pulse 85   Temp 98.1 F (36.7 C)   Resp 18   Ht 5\' 7"  (1.702 m)   Wt (!) 163.3 kg   LMP 07/29/2018   SpO2 100%   BMI 56.38 kg/m   Physical Exam  Constitutional: She is oriented to person, place, and time. She appears well-developed and well-nourished. No distress.  HENT:  Head: Normocephalic and atraumatic.  Eyes: Conjunctivae are normal.  She is a cloudy right eye with no light sensation.  Her left eye vision she says is blurry at baseline.  Neck: Neck supple.  Cardiovascular: Normal rate and regular rhythm.  No murmur heard. Pulmonary/Chest: Effort normal and breath sounds normal. No respiratory distress.  Abdominal: Soft. There is no tenderness.  Musculoskeletal: She exhibits no edema.  Neurological: She is alert and oriented to person, place, and time. She has normal strength. No sensory deficit. Gait normal. GCS eye subscore is 4. GCS verbal subscore is 5. GCS motor subscore is 6.  Skin: Skin is warm and dry.  Psychiatric: She has a normal mood and affect.  Nursing note and vitals reviewed.    ED Treatments / Results  Labs (all labs ordered are listed, but only abnormal results are displayed) Labs Reviewed  BASIC METABOLIC PANEL - Abnormal; Notable for the following components:      Result Value   CO2 21 (*)    All other components within normal limits  CBC WITH DIFFERENTIAL/PLATELET - Abnormal; Notable for the following components:   MCH 25.9 (*)    Lymphs Abs 5.9 (*)    All other components within normal limits  CBG MONITORING, ED - Abnormal; Notable for the following components:   Glucose-Capillary 53 (*)    All other components within normal limits  CBG MONITORING, ED - Abnormal; Notable for the following components:   Glucose-Capillary 52 (*)    All other components within normal limits    EKG None  Radiology Ct Head Wo Contrast  Result  Date: 08/25/2018 CLINICAL DATA:  Headache.  Right-sided numbness. EXAM: CT HEAD WITHOUT CONTRAST TECHNIQUE: Contiguous axial images were obtained from the base of the skull through the vertex without  intravenous contrast. COMPARISON:  None. FINDINGS: Brain: No evidence of acute infarction, hemorrhage, hydrocephalus, extra-axial collection or mass lesion/mass effect. Vascular: No hyperdense vessel or unexpected calcification. Skull: Normal. Negative for fracture or focal lesion. Sinuses/Orbits: No acute finding. Other: Large occipital scalp hematoma is noted. IMPRESSION: Large occipital scalp hematoma. No acute intracranial abnormality seen. Electronically Signed   By: Lupita Raider, M.D.   On: 08/25/2018 17:51    Procedures Procedures (including critical care time)  Medications Ordered in ED Medications - No data to display   Initial Impression / Assessment and Plan / ED Course  I have reviewed the triage vital signs and the nursing notes.  Pertinent labs & imaging results that were available during my care of the patient were reviewed by me and considered in my medical decision making (see chart for details).  Clinical Course as of Aug 25 2336  Tue Aug 25, 2018  1813 Continue to have some low blood sugars here.  She said she is newly on an increased dose of metformin.  She has been taking her regular insulin with that but has not had access to a glucometer.  She seems asymptomatic from that low blood sugar with a normal sensorium.   [MB]  [id number] Patient feels better after having eaten.  She says it helped her numbness in her pain to.  As far as the CAT scan ago she said she had a keloid in the back of her scalp and so that is likely what that soft tissue masses she denies any trauma.  She is comfortable going home and she agrees that maybe she is been having some low blood sugars that has been exacerbating her symptoms.  She is going to hold her insulin and follow-up with your doctor tomorrow to  get a glucose testing monitor so she can have some idea what she is treating when she gives herself her insulin.   [MB]    Clinical Course User Index [MB] Terrilee Files, MD     Final Clinical Impressions(s) / ED Diagnoses   Final diagnoses:  Hypoglycemia  Paresthesias    ED Discharge Orders    None       Terrilee Files, MD 08/25/18 2337

## 2018-08-25 NOTE — Discharge Instructions (Addendum)
You were evaluated in the emergency department for elevated blood sugar headaches and numbness on the right side of your body.  You had blood work and a CAT scan and the only significant finding we have found is that your blood sugar is running low.  You will need to hold your insulin and contact your primary care doctor tomorrow to get a new glucose testing monitor.  Please stay well-hydrated and have some food in your system.  Return if any concerns.

## 2018-08-25 NOTE — ED Notes (Signed)
CRITICAL VALUE ALERT  Critical Value:  GLucose 53  Date & Time Notied:  08/25/18 1518  Provider Notified: Danna HeftyButler,MD  Orders Received/Actions taken: Crackers and juice

## 2018-08-25 NOTE — ED Triage Notes (Addendum)
Pt reports she thinks her BP is up. Pt is taking losartan daily. Pt reports HA, pain and numbness right side -arm and leg. Pt reports speaking to doctor when these symptoms in right arm which started  started a month ago and was started on medication. Symptoms have progressed to right leg now

## 2018-11-03 ENCOUNTER — Encounter: Payer: Self-pay | Admitting: Podiatry

## 2018-11-03 ENCOUNTER — Telehealth: Payer: Self-pay | Admitting: *Deleted

## 2018-11-03 ENCOUNTER — Ambulatory Visit (INDEPENDENT_AMBULATORY_CARE_PROVIDER_SITE_OTHER): Payer: Medicaid Other | Admitting: Podiatry

## 2018-11-03 DIAGNOSIS — M79675 Pain in left toe(s): Secondary | ICD-10-CM

## 2018-11-03 DIAGNOSIS — M79674 Pain in right toe(s): Secondary | ICD-10-CM

## 2018-11-03 DIAGNOSIS — E1142 Type 2 diabetes mellitus with diabetic polyneuropathy: Secondary | ICD-10-CM

## 2018-11-03 DIAGNOSIS — B351 Tinea unguium: Secondary | ICD-10-CM

## 2018-11-03 NOTE — Telephone Encounter (Signed)
Dr. Stacie AcresMayer ordered diabetic shoe and inserts from [facility]. Faxed rx to [facility]. Orders faxed to [facility].

## 2018-11-03 NOTE — Progress Notes (Signed)
This patient presents to the office for an evaluation of her diabetic feet and treatment of her long thick painful nails.  She says that she is diabetic and had an amputation performed on the right big toe due to an infection.  She says she is having difficulty walking in her right foot and does experience pain and discomfort.  She states that she feels that her toes are shortening but are not painful.  She also says that she was told she has arthritis in her right rear foot.  She says that she has thick disfigured toenails on both feet.  She says she is unable to self treat her nails.  She presents the office today for an evaluation and treatment of her feet and nails.  General Appearance  Alert, conversant and in no acute stress.  Vascular  Dorsalis pedis and posterior tibial  pulses are palpable  bilaterally.  Capillary return is within normal limits  bilaterally. Temperature is within normal limits  bilaterally.  Neurologic  Senn-Weinstein monofilament wire test absent   bilaterally. Muscle power within normal limits bilaterally.  Nails Thick disfigured discolored nails with subungual debris  from hallux to fifth toes bilaterally except right hallux.  No evidence of bacterial infection or drainage bilaterally.  Orthopedic  No limitations of motion  feet .  No crepitus or effusions noted.  No bony pathology or digital deformities noted. TNJ DJD noted.  Hammer toes 2-5  Right.  Amputation hallux right foot.  Skin  normotropic skin with no porokeratosis noted bilaterally.  No signs of infections or ulcers noted.    Onychomycosis  X 9.  Diabetic neuropathy.  IE.  Diabetic exam performed revealing normal vascularity but absence of LOPS plantarly B/L.  Discussed her LOPS and recommended diabetic shoes for this patient.  She will need to go to Level 4 to receive her shoes.  Debride nails  X 9.  RTC for preventative foot care in 3 months.   Helane GuntherGregory Noora Locascio DPM

## 2018-12-05 ENCOUNTER — Encounter (HOSPITAL_COMMUNITY): Payer: Self-pay | Admitting: *Deleted

## 2018-12-05 ENCOUNTER — Other Ambulatory Visit: Payer: Self-pay

## 2018-12-05 ENCOUNTER — Emergency Department (HOSPITAL_COMMUNITY)
Admission: EM | Admit: 2018-12-05 | Discharge: 2018-12-06 | Disposition: A | Discharge status: Home / Self Care | Payer: Medicaid Other | Attending: Emergency Medicine | Admitting: Emergency Medicine

## 2018-12-05 DIAGNOSIS — Z79899 Other long term (current) drug therapy: Secondary | ICD-10-CM | POA: Diagnosis not present

## 2018-12-05 DIAGNOSIS — F332 Major depressive disorder, recurrent severe without psychotic features: Secondary | ICD-10-CM | POA: Insufficient documentation

## 2018-12-05 DIAGNOSIS — R45851 Suicidal ideations: Secondary | ICD-10-CM | POA: Diagnosis not present

## 2018-12-05 DIAGNOSIS — E119 Type 2 diabetes mellitus without complications: Secondary | ICD-10-CM | POA: Insufficient documentation

## 2018-12-05 DIAGNOSIS — Z87891 Personal history of nicotine dependence: Secondary | ICD-10-CM | POA: Diagnosis not present

## 2018-12-05 DIAGNOSIS — Z794 Long term (current) use of insulin: Secondary | ICD-10-CM | POA: Insufficient documentation

## 2018-12-05 DIAGNOSIS — F329 Major depressive disorder, single episode, unspecified: Secondary | ICD-10-CM | POA: Diagnosis present

## 2018-12-05 DIAGNOSIS — F331 Major depressive disorder, recurrent, moderate: Secondary | ICD-10-CM
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
## 2018-12-06 ENCOUNTER — Encounter (HOSPITAL_COMMUNITY): Payer: Self-pay

## 2018-12-06 ENCOUNTER — Inpatient Hospital Stay (HOSPITAL_COMMUNITY)
Admission: AD | Admit: 2018-12-06 | Discharge: 2018-12-12 | DRG: 885 | Disposition: A | Discharge status: Home / Self Care | Payer: Medicaid Other | Source: Intra-hospital | Attending: Emergency Medicine | Admitting: Emergency Medicine

## 2018-12-06 DIAGNOSIS — I10 Essential (primary) hypertension: Secondary | ICD-10-CM | POA: Diagnosis present

## 2018-12-06 DIAGNOSIS — Z794 Long term (current) use of insulin: Secondary | ICD-10-CM | POA: Diagnosis not present

## 2018-12-06 DIAGNOSIS — Z23 Encounter for immunization: Secondary | ICD-10-CM | POA: Diagnosis not present

## 2018-12-06 DIAGNOSIS — F332 Major depressive disorder, recurrent severe without psychotic features: Principal | ICD-10-CM | POA: Diagnosis present

## 2018-12-06 DIAGNOSIS — R45851 Suicidal ideations: Secondary | ICD-10-CM | POA: Diagnosis not present

## 2018-12-06 DIAGNOSIS — L03012 Cellulitis of left finger: Secondary | ICD-10-CM | POA: Diagnosis present

## 2018-12-06 DIAGNOSIS — Z89421 Acquired absence of other right toe(s): Secondary | ICD-10-CM | POA: Diagnosis not present

## 2018-12-06 DIAGNOSIS — E1165 Type 2 diabetes mellitus with hyperglycemia: Secondary | ICD-10-CM | POA: Diagnosis present

## 2018-12-06 DIAGNOSIS — Z87891 Personal history of nicotine dependence: Secondary | ICD-10-CM

## 2018-12-06 DIAGNOSIS — Z915 Personal history of self-harm: Secondary | ICD-10-CM | POA: Diagnosis not present

## 2018-12-06 DIAGNOSIS — H3530 Unspecified macular degeneration: Secondary | ICD-10-CM | POA: Diagnosis present

## 2018-12-06 DIAGNOSIS — Z79899 Other long term (current) drug therapy: Secondary | ICD-10-CM

## 2018-12-06 DIAGNOSIS — Z833 Family history of diabetes mellitus: Secondary | ICD-10-CM | POA: Diagnosis not present

## 2018-12-06 DIAGNOSIS — E7800 Pure hypercholesterolemia, unspecified: Secondary | ICD-10-CM | POA: Diagnosis present

## 2018-12-06 DIAGNOSIS — Z91410 Personal history of adult physical and sexual abuse: Secondary | ICD-10-CM | POA: Diagnosis not present

## 2018-12-06 DIAGNOSIS — E1140 Type 2 diabetes mellitus with diabetic neuropathy, unspecified: Secondary | ICD-10-CM | POA: Diagnosis present

## 2018-12-06 DIAGNOSIS — F419 Anxiety disorder, unspecified: Secondary | ICD-10-CM | POA: Diagnosis not present

## 2018-12-06 DIAGNOSIS — Z818 Family history of other mental and behavioral disorders: Secondary | ICD-10-CM | POA: Diagnosis not present

## 2018-12-06 DIAGNOSIS — G4700 Insomnia, unspecified: Secondary | ICD-10-CM | POA: Diagnosis present

## 2018-12-06 DIAGNOSIS — F411 Generalized anxiety disorder: Secondary | ICD-10-CM | POA: Diagnosis present

## 2018-12-06 DIAGNOSIS — K760 Fatty (change of) liver, not elsewhere classified: Secondary | ICD-10-CM | POA: Diagnosis present

## 2018-12-06 LAB — CBC WITH DIFFERENTIAL/PLATELET
Abs Immature Granulocytes: 0.01 10*3/uL (ref 0.00–0.07)
Basophils Absolute: 0.1 10*3/uL (ref 0.0–0.1)
Basophils Relative: 1 %
Eosinophils Absolute: 0.1 10*3/uL (ref 0.0–0.5)
Eosinophils Relative: 1 %
HEMATOCRIT: 39.8 % (ref 36.0–46.0)
HEMOGLOBIN: 12.1 g/dL (ref 12.0–15.0)
Immature Granulocytes: 0 %
LYMPHS ABS: 4.8 10*3/uL — AB (ref 0.7–4.0)
Lymphocytes Relative: 63 %
MCH: 24.8 pg — ABNORMAL LOW (ref 26.0–34.0)
MCHC: 30.4 g/dL (ref 30.0–36.0)
MCV: 81.6 fL (ref 80.0–100.0)
MONO ABS: 0.4 10*3/uL (ref 0.1–1.0)
Monocytes Relative: 5 %
Neutro Abs: 2.3 10*3/uL (ref 1.7–7.7)
Neutrophils Relative %: 30 %
Platelets: 226 10*3/uL (ref 150–400)
RBC: 4.88 MIL/uL (ref 3.87–5.11)
RDW: 14.6 % (ref 11.5–15.5)
WBC: 7.6 10*3/uL (ref 4.0–10.5)
nRBC: 0 % (ref 0.0–0.2)

## 2018-12-06 LAB — COMPREHENSIVE METABOLIC PANEL
ALT: 14 U/L (ref 0–44)
AST: 18 U/L (ref 15–41)
Albumin: 3.5 g/dL (ref 3.5–5.0)
Alkaline Phosphatase: 71 U/L (ref 38–126)
Anion gap: 8 (ref 5–15)
BUN: 14 mg/dL (ref 6–20)
CHLORIDE: 105 mmol/L (ref 98–111)
CO2: 23 mmol/L (ref 22–32)
Calcium: 8.7 mg/dL — ABNORMAL LOW (ref 8.9–10.3)
Creatinine, Ser: 0.83 mg/dL (ref 0.44–1.00)
GLUCOSE: 245 mg/dL — AB (ref 70–99)
Potassium: 3.6 mmol/L (ref 3.5–5.1)
Sodium: 136 mmol/L (ref 135–145)
Total Bilirubin: 0.4 mg/dL (ref 0.3–1.2)
Total Protein: 7 g/dL (ref 6.5–8.1)

## 2018-12-06 LAB — RAPID URINE DRUG SCREEN, HOSP PERFORMED
Amphetamines: NOT DETECTED
BENZODIAZEPINES: NOT DETECTED
Barbiturates: NOT DETECTED
COCAINE: NOT DETECTED
Opiates: NOT DETECTED
Tetrahydrocannabinol: POSITIVE — AB

## 2018-12-06 LAB — GLUCOSE, CAPILLARY
Glucose-Capillary: 204 mg/dL — ABNORMAL HIGH (ref 70–99)
Glucose-Capillary: 173 mg/dL — ABNORMAL HIGH (ref 70–99)
Glucose-Capillary: 197 mg/dL — ABNORMAL HIGH (ref 70–99)

## 2018-12-06 LAB — SALICYLATE LEVEL: Salicylate Lvl: <7 mg/dL (ref 2.8–30.0)

## 2018-12-06 LAB — HEMOGLOBIN A1C
Hgb A1c MFr Bld: 8.6 % — ABNORMAL HIGH (ref 4.8–5.6)
Mean Plasma Glucose: 200.12 mg/dL

## 2018-12-06 LAB — HCG, QUANTITATIVE, PREGNANCY: hCG, Beta Chain, Quant, S: <1 m[IU]/mL (ref <5)

## 2018-12-06 LAB — ETHANOL: Alcohol, Ethyl (B): <10 mg/dL (ref <10)

## 2018-12-06 LAB — CBG MONITORING, ED: Glucose-Capillary: 224 mg/dL — ABNORMAL HIGH (ref 70–99)

## 2018-12-06 LAB — ACETAMINOPHEN LEVEL: Acetaminophen (Tylenol), Serum: <10 ug/mL — ABNORMAL LOW (ref 10–30)

## 2018-12-06 MED ORDER — LOSARTAN POTASSIUM 50 MG PO TABS
100.0000 mg | ORAL_TABLET | Freq: Every day | ORAL | Status: DC
  Administered 2018-12-06 – 2018-12-12 (×7): 100 mg via ORAL
  Filled 2018-12-06 (×11): qty 2

## 2018-12-06 MED ORDER — METFORMIN HCL 500 MG PO TABS
1000.0000 mg | ORAL_TABLET | Freq: Two times a day (BID) | ORAL | Status: DC
  Administered 2018-12-06 – 2018-12-12 (×12): 1000 mg via ORAL
  Filled 2018-12-06 (×18): qty 2

## 2018-12-06 MED ORDER — ACETAMINOPHEN 325 MG PO TABS
650.0000 mg | ORAL_TABLET | Freq: Four times a day (QID) | ORAL | Status: DC | PRN
  Administered 2018-12-06 – 2018-12-08 (×6): 650 mg via ORAL
  Filled 2018-12-06 (×6): qty 2

## 2018-12-06 MED ORDER — INSULIN ASPART 100 UNIT/ML ~~LOC~~ SOLN
0.0000 [IU] | Freq: Three times a day (TID) | SUBCUTANEOUS | Status: DC
  Administered 2018-12-06 – 2018-12-07 (×4): 3 [IU] via SUBCUTANEOUS
  Administered 2018-12-08: 2 [IU] via SUBCUTANEOUS
  Administered 2018-12-08 – 2018-12-09 (×3): 3 [IU] via SUBCUTANEOUS
  Administered 2018-12-09: 9 [IU] via SUBCUTANEOUS
  Administered 2018-12-09: 5 [IU] via SUBCUTANEOUS
  Administered 2018-12-10 – 2018-12-11 (×6): 3 [IU] via SUBCUTANEOUS
  Administered 2018-12-12 (×2): 2 [IU] via SUBCUTANEOUS

## 2018-12-06 MED ORDER — INSULIN ASPART 100 UNIT/ML ~~LOC~~ SOLN: 0.0000 [IU] | Freq: Every day | SUBCUTANEOUS | Status: DC

## 2018-12-06 MED ORDER — HYPROMELLOSE (GONIOSCOPIC) 2.5 % OP SOLN
1.0000 [drp] | Freq: Three times a day (TID) | OPHTHALMIC | Status: DC
  Administered 2018-12-06 – 2018-12-12 (×14): 1 [drp] via OPHTHALMIC
  Filled 2018-12-06: qty 15

## 2018-12-06 MED ORDER — GABAPENTIN 300 MG PO CAPS
300.0000 mg | ORAL_CAPSULE | Freq: Two times a day (BID) | ORAL | Status: DC
  Administered 2018-12-06 – 2018-12-11 (×11): 300 mg via ORAL
  Filled 2018-12-06 (×16): qty 1

## 2018-12-06 MED ORDER — DORZOLAMIDE HCL-TIMOLOL MAL 2-0.5 % OP SOLN
1.0000 [drp] | Freq: Two times a day (BID) | OPHTHALMIC | Status: DC
  Administered 2018-12-06 – 2018-12-12 (×12): 1 [drp] via OPHTHALMIC
  Filled 2018-12-06: qty 10

## 2018-12-06 MED ORDER — ACETAMINOPHEN 500 MG PO TABS
1000.0000 mg | ORAL_TABLET | Freq: Once | ORAL | Status: AC
  Administered 2018-12-06: 1000 mg via ORAL
  Filled 2018-12-06: qty 2

## 2018-12-06 MED ORDER — INFLUENZA VAC SPLIT QUAD 0.5 ML IM SUSY
0.5000 mL | PREFILLED_SYRINGE | INTRAMUSCULAR | Status: AC
  Administered 2018-12-08: 0.5 mL via INTRAMUSCULAR
  Filled 2018-12-06: qty 0.5

## 2018-12-06 MED ORDER — ALUM & MAG HYDROXIDE-SIMETH 200-200-20 MG/5ML PO SUSP
30.0000 mL | ORAL | Status: DC | PRN
  Administered 2018-12-07 – 2018-12-12 (×6): 30 mL via ORAL
  Filled 2018-12-06 (×6): qty 30

## 2018-12-06 MED ORDER — GABAPENTIN 300 MG PO CAPS: 300.0000 mg | ORAL_CAPSULE | Freq: Two times a day (BID) | ORAL | Status: DC

## 2018-12-06 MED ORDER — LOSARTAN POTASSIUM 25 MG PO TABS: 100.0000 mg | ORAL_TABLET | Freq: Every day | ORAL | Status: DC

## 2018-12-06 MED ORDER — DOXEPIN HCL 25 MG PO CAPS
25.0000 mg | ORAL_CAPSULE | Freq: Every day | ORAL | Status: DC
  Administered 2018-12-06: 25 mg via ORAL
  Filled 2018-12-06 (×4): qty 1

## 2018-12-06 MED ORDER — DIPHENOXYLATE-ATROPINE 2.5-0.025 MG PO TABS
1.0000 | ORAL_TABLET | Freq: Four times a day (QID) | ORAL | Status: DC | PRN
  Administered 2018-12-06 – 2018-12-08 (×3): 1 via ORAL
  Filled 2018-12-06 (×3): qty 1

## 2018-12-06 MED ORDER — MAGNESIUM HYDROXIDE 400 MG/5ML PO SUSP: 30.0000 mL | Freq: Every day | ORAL | Status: DC | PRN

## 2018-12-06 MED ORDER — DORZOLAMIDE HCL-TIMOLOL MAL 2-0.5 % OP SOLN: 1.0000 [drp] | Freq: Two times a day (BID) | OPHTHALMIC | Status: DC

## 2018-12-06 MED ORDER — HYDROXYZINE HCL 25 MG PO TABS
25.0000 mg | ORAL_TABLET | Freq: Four times a day (QID) | ORAL | Status: DC | PRN
  Administered 2018-12-06 – 2018-12-12 (×7): 25 mg via ORAL
  Filled 2018-12-06 (×8): qty 1

## 2018-12-06 MED ORDER — MELOXICAM 7.5 MG PO TABS
7.5000 mg | ORAL_TABLET | Freq: Every day | ORAL | Status: DC
  Administered 2018-12-06 – 2018-12-12 (×7): 7.5 mg via ORAL
  Filled 2018-12-06 (×12): qty 1

## 2018-12-06 MED ORDER — INSULIN DETEMIR 100 UNIT/ML ~~LOC~~ SOLN
50.0000 [IU] | Freq: Every day | SUBCUTANEOUS | Status: DC
  Filled 2018-12-06: qty 0.5

## 2018-12-06 MED ORDER — PRAVASTATIN SODIUM 40 MG PO TABS
40.0000 mg | ORAL_TABLET | Freq: Every day | ORAL | Status: DC
  Administered 2018-12-06 – 2018-12-11 (×6): 40 mg via ORAL
  Filled 2018-12-06 (×9): qty 1

## 2018-12-06 MED ORDER — INSULIN DETEMIR 100 UNIT/ML ~~LOC~~ SOLN
50.0000 [IU] | Freq: Every day | SUBCUTANEOUS | Status: DC
  Administered 2018-12-06 – 2018-12-11 (×6): 50 [IU] via SUBCUTANEOUS
  Filled 2018-12-06: qty 0.5

## 2018-12-06 MED ORDER — CLONIDINE HCL 0.1 MG PO TABS: 0.1000 mg | ORAL_TABLET | Freq: Three times a day (TID) | ORAL | Status: DC | PRN

## 2018-12-06 MED ORDER — ESCITALOPRAM OXALATE 10 MG PO TABS
10.0000 mg | ORAL_TABLET | Freq: Every day | ORAL | Status: DC
  Administered 2018-12-06 – 2018-12-12 (×7): 10 mg via ORAL
  Filled 2018-12-06 (×13): qty 1

## 2018-12-06 MED ORDER — PRAVASTATIN SODIUM 40 MG PO TABS
40.0000 mg | ORAL_TABLET | Freq: Every day | ORAL | Status: DC
  Filled 2018-12-06: qty 1

## 2018-12-06 MED ORDER — INSULIN ASPART 100 UNIT/ML ~~LOC~~ SOLN
9.0000 [IU] | Freq: Three times a day (TID) | SUBCUTANEOUS | Status: DC
  Administered 2018-12-06 – 2018-12-12 (×19): 9 [IU] via SUBCUTANEOUS

## 2018-12-06 MED ORDER — INSULIN ASPART 100 UNIT/ML ~~LOC~~ SOLN
9.0000 [IU] | Freq: Three times a day (TID) | SUBCUTANEOUS | Status: DC
  Administered 2018-12-06: 9 [IU] via SUBCUTANEOUS
  Filled 2018-12-06: qty 1

## 2018-12-06 MED ORDER — MELOXICAM 7.5 MG PO TABS
7.5000 mg | ORAL_TABLET | Freq: Every day | ORAL | Status: DC
  Filled 2018-12-06 (×2): qty 1

## 2018-12-06 MED ORDER — METFORMIN HCL 500 MG PO TABS
1000.0000 mg | ORAL_TABLET | Freq: Two times a day (BID) | ORAL | Status: DC
  Administered 2018-12-06: 1000 mg via ORAL
  Filled 2018-12-06: qty 2

## 2018-12-06 NOTE — ED Notes (Signed)
Report given to [facility]
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
## 2018-12-06 NOTE — ED Notes (Signed)
Pt placed in paper scrub top. Unable to find scrub pants to fit. Pt in personal pants. RN, Charge, and [facility] aware.

## 2018-12-06 NOTE — ED Notes (Signed)
Pt picked up by [facility], call to [facility] to verify they have report.
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
## 2018-12-06 NOTE — Progress Notes (Signed)
Patient did not attend 4:15 pm group on psychoeducational information today.
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
## 2018-12-06 NOTE — ED Provider Notes (Signed)
[facility] EMERGENCY DEPARTMENT Provider Note   CSN: [id number] Arrival date & time: 12/05/18  2332  Time seen 12:15 AM   History   Chief Complaint Chief Complaint  Patient presents with  . V70.1    HPI Rachel George is a 38 y.o. female.  HPI patient states she has a history of major depression and has tried to overdose on pills in the past.  Her last admission was in December 2018 when she was admitted at [facility].  She states at that time she was started on medication which helped.  When I review her records she was on trazodone 100 mg at bedtime, hydroxyzine 50 mg 3 times daily for anxiety and Lexapro 10 mg daily.  She states she was on it through April and then did not follow-up to stay on her prescriptions at [facility].  She states she is never had a psychiatrist or a therapist.  She states for the past few weeks she has had a situation and her home with her husband of 6 years and today she felt like she might try to overdose again to hurt her self.  She states she still feels that way.  Patient has a history of diabetes and states her blood sugars are usually in the 1 60-1 70 range.  She states her A1c was 8.4 last time it was checked.  Patient states she has no children, she is legally blind.  She also complains of pain in her left ring finger today around the fingernail.  PCP Toma Deiters, MD   Past Medical History:  Diagnosis Date  . Cataract   . Diabetes mellitus without complication (HCC)    dx age 89  . Fatty liver disease, nonalcoholic   . Macular degeneration   . Partial blindness     Patient Active Problem List   Diagnosis Date Noted  . Major depressive disorder, single episode, severe without psychosis (HCC) 11/04/2017  . Suicidal ideation 11/04/2017  . 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 Uncontrolled type 2 diabetes mellitus with hyperglycemia, with long-term current use of insulin (HCC) 05/01/2017  . Obesity, Class III, BMI  40-49.9 (morbid obesity) (HCC) 05/01/2017  . Morbid obesity with BMI of 50.0-59.9, adult (HCC) 05/01/2017  . Diabetic retinopathy associated with type 2 diabetes mellitus (HCC) 03/18/2017  . 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  . Osteomyelitis (HCC) 12/13/2016  . Diabetic foot ulcer (HCC) 10/15/2016  . Cellulitis of toe of right foot 10/07/2016  . Uncontrolled type 2 diabetes mellitus with complication (HCC) 09/09/2016  . Morbid obesity (HCC) 09/09/2016  . Diabetic polyneuropathy associated with type 2 diabetes mellitus (HCC) 09/09/2016    Past Surgical History:  Procedure Laterality Date  . AMPUTATION TOE Right 12/16/2016   Procedure: RIGHT FIRST TOE AMPUTATION;  Surgeon: Ancil Linsey, MD;  Location: [facility];  Service: General;  Laterality: Right;  Right first toe amputation for osteomyelitis  . eye lid transplant    . RETINAL LASER PROCEDURE       OB History    Gravida  0   Para  0   Term  0   Preterm  0   AB  0   Living  0     SAB  0   TAB  0   Ectopic  0   Multiple  0   Live Births  0            Home Medications    Prior to Admission medications  Medication Sig Start Date End Date Taking? Authorizing Provider  atropine 1 % ophthalmic solution Place 1 drop into the right eye 3 (three) times daily. 10/14/17  Yes [provider]  dorzolamide-timolol (COSOPT) 22.3-6.8 MG/ML ophthalmic solution Place 1 drop into the left eye 2 times daily. 09/21/18  Yes [provider]  gabapentin (NEURONTIN) 300 MG capsule Take 1 capsule (300 mg total) by mouth 2 (two) times daily. 03/18/17  Yes Jacquelin Hawking, PA-C  ibuprofen (ADVIL,MOTRIN) 600 MG tablet Take 1 tablet (600 mg total) by mouth every 6 (six) hours as needed. 06/30/18  Yes Idol, Raynelle Fanning, PA-C  insulin aspart (NOVOLOG) 100 UNIT/ML injection Inject 9 Units into the skin 3 (three) times daily before meals. 11/13/17  Yes McNew, Ileene Hutchinson, MD  insulin detemir (LEVEMIR) 100 UNIT/ML injection Inject 0.5 mLs (50  Units total) into the skin at bedtime. Reported on 04/16/2016 11/13/17  Yes McNew, Ileene Hutchinson, MD  losartan (COZAAR) 100 MG tablet Take by mouth.   Yes [provider]  lovastatin (MEVACOR) 20 MG tablet Take 1 tablet (20 mg total) by mouth at bedtime. 04/24/17  Yes Jacquelin Hawking, PA-C  meloxicam (MOBIC) 7.5 MG tablet Take 7.5 mg by mouth daily.    Yes [provider]  metFORMIN (GLUCOPHAGE) 1000 MG tablet Take 1,000 mg by mouth 2 (two) times daily with a meal.   Yes [provider]  bacitracin-polymyxin b (POLYSPORIN) ophthalmic ointment Place 1 application into the right eye 4 (four) times daily. 10/14/17   [provider]  brimonidine (ALPHAGAN) 0.2 % ophthalmic solution Place 1 drop into the right eye every 8 (eight) hours. 10/14/17   [provider]  dorzolamide (TRUSOPT) 2 % ophthalmic solution Place 1 drop into the right eye 3 (three) times daily. 10/14/17   [provider]  moxifloxacin (VIGAMOX) 0.5 % ophthalmic solution Place 1 drop into the right eye 4 (four) times daily.    [provider]  prednisoLONE acetate (PRED FORTE) 1 % ophthalmic suspension Place 1 drop into the right eye 4 (four) times daily. 10/14/17   [provider]  timolol (BETIMOL) 0.5 % ophthalmic solution Place 1 drop into the right eye 2 (two) times daily. 10/14/17   [provider]    Family History Family History  Problem Relation Age of Onset  . Diabetes Father     Social History Social History   Tobacco Use  . Smoking status: Former Smoker    Packs/day: 0.25    Types: Cigarettes    Last attempt to quit: 12/11/2016    Years since quitting: 1.9  . Smokeless tobacco: Never Used  Substance Use Topics  . Alcohol use: No  . Drug use: No  On disability for being legally blind Married for 6 years  Allergies   Shellfish allergy and Percocet [oxycodone-acetaminophen]   Review of Systems Review of Systems  All other systems  reviewed and are negative.    Physical Exam Updated Vital Signs BP 140/79 (BP Location: Left Arm)   Temp 98.8 F (37.1 C) (Oral)   Resp 18   Ht 5\' 7"  (1.702 m)   Wt (!) 163.3 kg   SpO2 97%   BMI 56.38 kg/m   Physical Exam Vitals signs and nursing note reviewed.  Constitutional:      General: She is not in acute distress.    Appearance: Normal appearance. She is well-developed. She is obese. She is not ill-appearing or toxic-appearing.  HENT:     Head: Normocephalic  and atraumatic.     Right Ear: External ear normal.     Left Ear: External ear normal.     Nose: Nose normal. No mucosal edema or rhinorrhea.     Mouth/Throat:     Mouth: Mucous membranes are dry.     Dentition: No dental abscesses.     Pharynx: No uvula swelling.  Eyes:     Extraocular Movements: Extraocular movements intact.     Conjunctiva/sclera: Conjunctivae normal.     Comments: Patient's right cornea is opaque.  Neck:     Musculoskeletal: Full passive range of motion without pain, normal range of motion and neck supple.  Cardiovascular:     Rate and Rhythm: Normal rate and regular rhythm.     Heart sounds: Normal heart sounds. No murmur. No friction rub. No gallop.   Pulmonary:     Effort: Pulmonary effort is normal. No respiratory distress.     Breath sounds: Normal breath sounds. No wheezing, rhonchi or rales.  Chest:     Chest wall: No tenderness or crepitus.  Abdominal:     General: Bowel sounds are normal. There is no distension.     Palpations: Abdomen is soft.     Tenderness: There is no abdominal tenderness. There is no guarding or rebound.  Musculoskeletal: Normal range of motion.        General: No swelling or tenderness.     Comments: Patient complains of pain around the cuticle of her left ring finger, there may be some mild increased redness in one area but there is no definite paronychia seen yet. Moves all extremities well.   Skin:    General: Skin is warm and dry.     Coloration:  Skin is not pale.     Findings: No erythema or rash.  Neurological:     Mental Status: She is alert and oriented to person, place, and time.     Cranial Nerves: No cranial nerve deficit.  Psychiatric:        Mood and Affect: Mood is not anxious.        Speech: Speech normal.        Behavior: Behavior normal.      ED Treatments / Results  Labs (all labs ordered are listed, but only abnormal results are displayed) Results for orders placed or performed during the hospital encounter of 12/05/18  Comprehensive metabolic panel  Result Value Ref Range   Sodium 136 135 - 145 mmol/L   Potassium 3.6 3.5 - 5.1 mmol/L   Chloride 105 98 - 111 mmol/L   CO2 23 22 - 32 mmol/L   Glucose, Bld 245 (H) 70 - 99 mg/dL   BUN 14 6 - 20 mg/dL   Creatinine, Ser 1.610.83 0.44 - 1.00 mg/dL   Calcium 8.7 (L) 8.9 - 10.3 mg/dL   Total Protein 7.0 6.5 - 8.1 g/dL   Albumin 3.5 3.5 - 5.0 g/dL   AST 18 15 - 41 U/L   ALT 14 0 - 44 U/L   Alkaline Phosphatase 71 38 - 126 U/L   Total Bilirubin 0.4 0.3 - 1.2 mg/dL   GFR calc non Af Amer >60 >60 mL/min   GFR calc Af Amer >60 >60 mL/min   Anion gap 8 5 - 15  Ethanol  Result Value Ref Range   Alcohol, Ethyl (B) <10 <10 mg/dL  Urine rapid drug screen (hosp performed)  Result Value Ref Range   Opiates NONE DETECTED NONE DETECTED   Cocaine NONE  DETECTED NONE DETECTED   Benzodiazepines NONE DETECTED NONE DETECTED   Amphetamines NONE DETECTED NONE DETECTED   Tetrahydrocannabinol POSITIVE (A) NONE DETECTED   Barbiturates NONE DETECTED NONE DETECTED  CBC with Diff  Result Value Ref Range   WBC 7.6 4.0 - 10.5 K/uL   RBC 4.88 3.87 - 5.11 MIL/uL   Hemoglobin 12.1 12.0 - 15.0 g/dL   HCT 78.239.8 95.636.0 - 21.346.0 %   MCV 81.6 80.0 - 100.0 fL   MCH 24.8 (L) 26.0 - 34.0 pg   MCHC 30.4 30.0 - 36.0 g/dL   RDW 08.614.6 57.811.5 - 46.915.5 %   Platelets 226 150 - 400 K/uL   nRBC 0.0 0.0 - 0.2 %   Neutrophils Relative % 30 %   Neutro Abs 2.3 1.7 - 7.7 K/uL   Lymphocytes Relative 63 %    Lymphs Abs 4.8 (H) 0.7 - 4.0 K/uL   Monocytes Relative 5 %   Monocytes Absolute 0.4 0.1 - 1.0 K/uL   Eosinophils Relative 1 %   Eosinophils Absolute 0.1 0.0 - 0.5 K/uL   Basophils Relative 1 %   Basophils Absolute 0.1 0.0 - 0.1 K/uL   Immature Granulocytes 0 %   Abs Immature Granulocytes 0.01 0.00 - 0.07 K/uL  hCG, quantitative, pregnancy  Result Value Ref Range   hCG, Beta Chain, Quant, S <1 <5 mIU/mL  Acetaminophen level  Result Value Ref Range   Acetaminophen (Tylenol), Serum <10 (L) 10 - 30 ug/mL  Salicylate level  Result Value Ref Range   Salicylate Lvl <7.0 2.8 - 30.0 mg/dL   Laboratory interpretation all normal except +UDS, hyperglycemia    EKG None  Radiology No results found.  Procedures Procedures (including critical care time)  Medications Ordered in ED Medications  acetaminophen (TYLENOL) tablet 1,000 mg (1,000 mg Oral Given 12/06/18 0044)     Initial Impression / Assessment and Plan / ED Course  I have reviewed the triage vital signs and the nursing notes.  Pertinent labs & imaging results that were available during my care of the patient were reviewed by me and considered in my medical decision making (see chart for details).     I reviewed her admission in December 2018 at [facility].  Her discharge psychiatric medications were reviewed.  Patient was admitted December 4 through December 13.  Patient's finger was soaked in warm water.  I woke patient up around 3:40 AM and asked her to give us a urine sample so we could continue with our TTS consult.  4:10 AM psych holding orders were placed.  I need to have the nurses verify her home medications it is not clear what she is currently taking.  4:51 AM Ala DachFord, TTS has evaluated patient and states she meets criteria for inpatient placement.  He states he will know about a bed shortly.  Final Clinical Impressions(s) / ED Diagnoses   Final diagnoses:  Moderate episode of recurrent major depressive  disorder (HCC)  Suicidal ideation    Disposition pending admission to inpatient psychiatric facility  Devoria AlbeIva Odean Fester, MD, Concha PyoFACEP    Dareld Mcauliffe, MD 12/06/18 [phone]

## 2018-12-06 NOTE — BH Assessment (Signed)
[facility], [facility] at [facility], said Pt can be transferred to the service of Dr. Jola Babinski, room 405-1 after 0900. Notified Brooke, Copy.   Harlin Rain Patsy Baltimore, LPC, [facility], [facility] Triage Specialist [phone]

## 2018-12-06 NOTE — Progress Notes (Signed)
Adult Psychoeducational Group Note  Date:  12/06/2018 Time:  10:23 PM  Group Topic/Focus:  Wrap-Up Group:   The focus of this group is to help patients review their daily goal of treatment and discuss progress on daily workbooks.  Participation Level:  Active  Participation Quality:  Appropriate  Affect:  Appropriate  Cognitive:  Appropriate  Insight: Appropriate  Engagement in Group:  Engaged  Modes of Intervention:  Discussion  Additional Comments:  Patient attended group and said that her day was a 8. Patient described her day as being a pleasant one.   [place] Karl W Sherby Moncayo 12/06/2018, 10:23 PM

## 2018-12-06 NOTE — Tx Team (Signed)
Initial Treatment Plan 12/06/2018 10:56 AM Earley Abide [id number]    PATIENT STRESSORS: Health problems Marital or family conflict   PATIENT STRENGTHS: Ability for insight Average or above average intelligence Motivation for treatment/growth Supportive family/friends   PATIENT IDENTIFIED PROBLEMS: "coping skills for the possible separation from her husband  Plan for her health                   DISCHARGE CRITERIA:  Ability to meet basic life and health needs Adequate post-discharge living arrangements Improved stabilization in mood, thinking, and/or behavior Medical problems require only outpatient monitoring  PRELIMINARY DISCHARGE PLAN: Participate in family therapy Return to previous living arrangement  PATIENT/FAMILY INVOLVEMENT: This treatment plan has been presented to and reviewed with the patient, Rachel George.  The patient and family have been given the opportunity to ask questions and make suggestions.  Raylene Miyamoto, RN 12/06/2018, 10:56 AM

## 2018-12-07 LAB — LIPID PANEL
Cholesterol: 167 mg/dL (ref 0–200)
HDL: 50 mg/dL (ref >40)
LDL CALC: 83 mg/dL (ref 0–99)
Total CHOL/HDL Ratio: 3.3 RATIO
Triglycerides: 168 mg/dL — ABNORMAL HIGH (ref <150)
VLDL: 34 mg/dL (ref 0–40)

## 2018-12-07 LAB — GLUCOSE, CAPILLARY
GLUCOSE-CAPILLARY: 160 mg/dL — AB (ref 70–99)
Glucose-Capillary: 149 mg/dL — ABNORMAL HIGH (ref 70–99)
Glucose-Capillary: 174 mg/dL — ABNORMAL HIGH (ref 70–99)
Glucose-Capillary: 164 mg/dL — ABNORMAL HIGH (ref 70–99)

## 2018-12-07 LAB — HEMOGLOBIN A1C
HEMOGLOBIN A1C: 8.6 % — AB (ref 4.8–5.6)
Mean Plasma Glucose: 200.12 mg/dL

## 2018-12-07 LAB — TSH: TSH: 1.502 u[IU]/mL (ref 0.350–4.500)

## 2018-12-07 NOTE — Progress Notes (Signed)
Patient ID: Rachel George, female   DOB: 26-Jan-1981, 38 y.o.   MRN: [id number] D: Patient denies SI/HI and auditory and visual hallucinations. Patient has a depressed mood and rated her depression a 5. She rates her anxiety a 7 and feelings of hopelessness a 5.Patient has complained of diarrhea today.  A: Patient given emotional support from RN. Patient given medications per MD orders.  Given Imodium for diarrhea. Patient encouraged to attend groups and unit activities. Patient encouraged to come to staff with any questions or concerns.  R: Patient remains cooperative and appropriate. Will continue to monitor patient for safety.
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
## 2018-12-07 NOTE — Progress Notes (Signed)
Pt in room at shift change taking a shower.  Pt later sts she has some pain and swelling in one of her fingers on her L hand.  Slight swelling noted and nail bed.  Pt requests something for pain.  Pt denies SI, HI and AVH.  Pt verbally contracts for safety.  Pt is high fall risk, has 1 missing toe and 1 missing eye.  Pt BS equaled 197 with no coverage needed except evening does of LEVEMIR. Pt denies SI, HI and AVH.  Pt verbally contracts for safety.   Pt is high fall risk and cane has been ordered.  Pt refuses wheelchair.   Pt remains safe on unit

## 2018-12-07 NOTE — [facility] Suicide Risk Assessment (Signed)
[facility] INPATIENT:  Family/Significant Other Suicide Prevention Education  Suicide Prevention Education:  Contact Attempts: [name] [phone] has been identified by the patient as the family member/significant other with whom the patient will be residing, and identified as the person(s) who will aid the patient in the event of a mental health crisis.  With written consent from the patient, two attempts were made to provide suicide prevention education, prior to and/or following the patient's discharge.  We were unsuccessful in providing suicide prevention education.  A suicide education pamphlet was given to the patient to share with family/significant other.  Date and time of first attempt: 12/07/2018 / 3:50pm Date and time of second attempt: To be attempted at a later time.  Rachel George 12/07/2018, 3:51 PM

## 2018-12-07 NOTE — Progress Notes (Signed)
Recreation Therapy Notes  Date: 1.6.20 Time: 0930 Location: [address] Dayroom  Group Topic: Stress Management  Goal Area(s) Addresses:  Patient will engage in healthy stress management techniques. Patient will be able to identify positive stress management techniques.  Intervention: Stress Management  Activity : Meditation.  LRT introduced the stress management technique of meditation.  LRT played a meditation that focused on looking at each day as a new beginning.  Patients were to follow along as meditation was played in order to engage in activity.  Education:  Stress Management, Discharge Planning.   Education Outcome: Acknowledges Education  Clinical Observations/Feedback: Pt did not attend group.    Fahed Morten, LRT/CTRS         Catherina Pates A 12/07/2018 10:50 AM 

## 2018-12-07 NOTE — Tx Team (Signed)
Interdisciplinary Treatment and Diagnostic Plan Update  12/07/2018 Time of Session:  Rachel George MRN: [id number]  Principal Diagnosis: <principal problem not specified>  Secondary Diagnoses: Active Problems:   Severe recurrent major depression without psychotic features (HCC)   Current Medications:  Current Facility-Administered Medications  Medication Dose Route Frequency Provider Last Rate Last Dose  . acetaminophen (TYLENOL) tablet 650 mg  650 mg Oral Q6H PRN Lindon Romp A, NP   650 mg at 12/06/18 2240  . alum & mag hydroxide-simeth (MAALOX/MYLANTA) 200-200-20 MG/5ML suspension 30 mL  30 mL Oral Q4H PRN Lindon Romp A, NP      . cloNIDine (CATAPRES) tablet 0.1 mg  0.1 mg Oral TID PRN Sharma Covert, MD      . diphenoxylate-atropine (LOMOTIL) 2.5-0.025 MG per tablet 1 tablet  1 tablet Oral QID PRN Sharma Covert, MD   1 tablet at 12/07/18 0900  . dorzolamide-timolol (COSOPT) 22.3-6.8 MG/ML ophthalmic solution 1 drop  1 drop Left Eye BID Lindon Romp A, NP   1 drop at 12/07/18 0835  . doxepin (SINEQUAN) capsule 25 mg  25 mg Oral QHS Sharma Covert, MD   25 mg at 12/06/18 2130  . escitalopram (LEXAPRO) tablet 10 mg  10 mg Oral Daily Sharma Covert, MD   10 mg at 12/07/18 0835  . gabapentin (NEURONTIN) capsule 300 mg  300 mg Oral BID Lindon Romp A, NP   300 mg at 12/07/18 8101  . hydroxypropyl methylcellulose / hypromellose (ISOPTO TEARS / GONIOVISC) 2.5 % ophthalmic solution 1 drop  1 drop Left Eye TID Sharma Covert, MD   1 drop at 12/07/18 0836  . hydrOXYzine (ATARAX/VISTARIL) tablet 25 mg  25 mg Oral Q6H PRN Sharma Covert, MD   25 mg at 12/06/18 2130  . Influenza vac split quadrivalent PF (FLUARIX) injection 0.5 mL  0.5 mL Intramuscular Tomorrow-1000 Sharma Covert, MD      . insulin aspart (novoLOG) injection 0-15 Units  0-15 Units Subcutaneous TID WC Sharma Covert, MD   3 Units at 12/07/18 [phone]  . insulin aspart (novoLOG) injection 0-5  Units  0-5 Units Subcutaneous QHS Sharma Covert, MD      . insulin aspart (novoLOG) injection 9 Units  9 Units Subcutaneous TID AC Rozetta Nunnery, NP   9 Units at 12/07/18 0612  . insulin detemir (LEVEMIR) injection 50 Units  50 Units Subcutaneous QHS Lindon Romp A, NP   50 Units at 12/06/18 2134  . losartan (COZAAR) tablet 100 mg  100 mg Oral Daily Lindon Romp A, NP   100 mg at 12/07/18 0834  . magnesium hydroxide (MILK OF MAGNESIA) suspension 30 mL  30 mL Oral Daily PRN Lindon Romp A, NP      . meloxicam (MOBIC) tablet 7.5 mg  7.5 mg Oral Daily Lindon Romp A, NP   7.5 mg at 12/07/18 0837  . metFORMIN (GLUCOPHAGE) tablet 1,000 mg  1,000 mg Oral BID WC Lindon Romp A, NP   1,000 mg at 12/07/18 0835  . pravastatin (PRAVACHOL) tablet 40 mg  40 mg Oral q1800 Lindon Romp A, NP   40 mg at 12/06/18 1706   PTA Medications: Medications Prior to Admission  Medication Sig Dispense Refill Last Dose  . dorzolamide-timolol (COSOPT) 22.3-6.8 MG/ML ophthalmic solution Place 1 drop into the left eye 2 times daily.   12/05/2018 at Unknown time  . gabapentin (NEURONTIN) 300 MG capsule Take 1 capsule (300 mg total) by mouth 2 (  two) times daily. 180 capsule 0 12/05/2018 at Unknown time  . ibuprofen (ADVIL,MOTRIN) 600 MG tablet Take 1 tablet (600 mg total) by mouth every 6 (six) hours as needed. 15 tablet 0 Past Week at Unknown time  . insulin aspart (NOVOLOG) 100 UNIT/ML injection Inject 9 Units into the skin 3 (three) times daily before meals. 10 mL 11 12/05/2018 at Unknown time  . insulin detemir (LEVEMIR) 100 UNIT/ML injection Inject 0.5 mLs (50 Units total) into the skin at bedtime. Reported on 04/16/2016 10 mL 11 12/05/2018 at Unknown time  . losartan (COZAAR) 100 MG tablet Take by mouth.   12/05/2018 at Unknown time  . lovastatin (MEVACOR) 20 MG tablet Take 1 tablet (20 mg total) by mouth at bedtime. 30 tablet 3 12/05/2018 at Unknown time  . meloxicam (MOBIC) 7.5 MG tablet Take 7.5 mg by mouth daily.    12/05/2018  at Unknown time  . metFORMIN (GLUCOPHAGE) 1000 MG tablet Take 1,000 mg by mouth 2 (two) times daily with a meal.   12/05/2018 at Unknown time    Patient Stressors: Health problems Marital or family conflict  Patient Strengths: Ability for insight Average or above average intelligence Motivation for treatment/growth Supportive family/friends  Treatment Modalities: Medication Management, Group therapy, Case management,  1 to 1 session with clinician, Psychoeducation, Recreational therapy.   Physician Treatment Plan for Primary Diagnosis: <principal problem not specified> Long Term Goal(s): Improvement in symptoms so as ready for discharge Improvement in symptoms so as ready for discharge   Short Term Goals: Ability to identify changes in lifestyle to reduce recurrence of condition will improve Ability to verbalize feelings will improve Ability to disclose and discuss suicidal ideas Ability to demonstrate self-control will improve Ability to identify and develop effective coping behaviors will improve Ability to maintain clinical measurements within normal limits will improve Compliance with prescribed medications will improve Ability to identify changes in lifestyle to reduce recurrence of condition will improve Ability to verbalize feelings will improve Ability to disclose and discuss suicidal ideas Ability to demonstrate self-control will improve Ability to identify and develop effective coping behaviors will improve Ability to maintain clinical measurements within normal limits will improve Compliance with prescribed medications will improve  Medication Management: Evaluate patient's response, side effects, and tolerance of medication regimen.  Therapeutic Interventions: 1 to 1 sessions, Unit Group sessions and Medication administration.  Evaluation of Outcomes: Not Met  Physician Treatment Plan for Secondary Diagnosis: Active Problems:   Severe recurrent major depression  without psychotic features ([place])  Long Term Goal(s): Improvement in symptoms so as ready for discharge Improvement in symptoms so as ready for discharge   Short Term Goals: Ability to identify changes in lifestyle to reduce recurrence of condition will improve Ability to verbalize feelings will improve Ability to disclose and discuss suicidal ideas Ability to demonstrate self-control will improve Ability to identify and develop effective coping behaviors will improve Ability to maintain clinical measurements within normal limits will improve Compliance with prescribed medications will improve Ability to identify changes in lifestyle to reduce recurrence of condition will improve Ability to verbalize feelings will improve Ability to disclose and discuss suicidal ideas Ability to demonstrate self-control will improve Ability to identify and develop effective coping behaviors will improve Ability to maintain clinical measurements within normal limits will improve Compliance with prescribed medications will improve     Medication Management: Evaluate patient's response, side effects, and tolerance of medication regimen.  Therapeutic Interventions: 1 to 1 sessions, Unit Group sessions and Medication  administration.  Evaluation of Outcomes: Not Met   RN Treatment Plan for Primary Diagnosis: <principal problem not specified> Long Term Goal(s): Knowledge of disease and therapeutic regimen to maintain health will improve  Short Term Goals: Ability to participate in decision making will improve, Ability to verbalize feelings will improve, Ability to disclose and discuss suicidal ideas and Ability to identify and develop effective coping behaviors will improve  Medication Management: RN will administer medications as ordered by provider, will assess and evaluate patient's response and provide education to patient for prescribed medication. RN will report any adverse and/or side effects to  prescribing provider.  Therapeutic Interventions: 1 on 1 counseling sessions, Psychoeducation, Medication administration, Evaluate responses to treatment, Monitor vital signs and CBGs as ordered, Perform/monitor CIWA, COWS, AIMS and Fall Risk screenings as ordered, Perform wound care treatments as ordered.  Evaluation of Outcomes: Not Met   LCSW Treatment Plan for Primary Diagnosis: <principal problem not specified> Long Term Goal(s): Safe transition to appropriate next level of care at discharge, Engage patient in therapeutic group addressing interpersonal concerns.  Short Term Goals: Engage patient in aftercare planning with referrals and resources  Therapeutic Interventions: Assess for all discharge needs, 1 to 1 time with Social worker, Explore available resources and support systems, Assess for adequacy in community support network, Educate family and significant other(s) on suicide prevention, Complete Psychosocial Assessment, Interpersonal group therapy.  Evaluation of Outcomes: Not Met   Progress in Treatment: Attending groups: No. Participating in groups: No. Taking medication as prescribed: Yes. Toleration medication: Yes. Family/Significant other contact made: No, will contact:  the patient's father Patient understands diagnosis: Yes. Discussing patient identified problems/goals with staff: Yes. Medical problems stabilized or resolved: Yes. Denies suicidal/homicidal ideation: Yes. Issues/concerns per patient self-inventory: No. Other:   New problem(s) identified: None   New Short Term/Long Term Goal(s): medication stabilization, elimination of SI thoughts, development of comprehensive mental wellness plan.    Patient Goals:  coping skills for the possible separation from her husband  Discharge Plan or Barriers: Patient plans to return to her home with her spouse and children. She plans to follow up with [facility] for outpatient medication management and therapy services.  CSW will continue to follow and assess for appropriate referrals and possible discharge planning.   Reason for Continuation of Hospitalization: Anxiety Depression Medication stabilization Suicidal ideation  Estimated Length of Stay: 2-3 days   Attendees: Patient: 12/07/2018 11:13 AM  Physician: Dr. Neita Garnet, MD 12/07/2018 11:13 AM  Nursing: Maudie Mercury.[email or web address] Mages, RN 12/07/2018 11:13 AM  RN Care Manager: 12/07/2018 11:13 AM  Social Worker: Radonna Ricker, [place] 12/07/2018 11:13 AM  Recreational Therapist:  12/07/2018 11:13 AM  Other: Mable Paris, NP  12/07/2018 11:13 AM  Other:  12/07/2018 11:13 AM  Other: 12/07/2018 11:13 AM    Scribe for Treatment Team: Marylee Floras, [place] 12/07/2018 11:13 AM

## 2018-12-07 NOTE — [facility] Group Notes (Signed)
LCSW Group Therapy Note 12/07/2018 11:34 AM  Type of Therapy and Topic: Group Therapy: Overcoming Obstacles  Participation Level: Active  Description of Group:  In this group patients will be encouraged to explore what they see as obstacles to their own wellness and recovery. They will be guided to discuss their thoughts, feelings, and behaviors related to these obstacles. The group will process together ways to cope with barriers, with attention given to specific choices patients can make. Each patient will be challenged to identify changes they are motivated to make in order to overcome their obstacles. This group will be process-oriented, with patients participating in exploration of their own experiences as well as giving and receiving support and challenge from other group members.  Therapeutic Goals: 1. Patient will identify personal and current obstacles as they relate to admission. 2. Patient will identify barriers that currently interfere with their wellness or overcoming obstacles.  3. Patient will identify feelings, thought process and behaviors related to these barriers. 4. Patient will identify two changes they are willing to make to overcome these obstacles:   Summary of Patient Progress  Rachel George was engaged and participated throughout the group session. Rachel George reports that her main obstacles is her negative environment and her declining health. Rachel George reports that the main barrier she has is her progressing blindness. Rachel George states that mental discipline is how she plans to overcome these obstacles.     Therapeutic Modalities:  Cognitive Behavioral Therapy Solution Focused Therapy Motivational Interviewing Relapse Prevention Therapy   Alcario Drought Clinical Social Worker

## 2018-12-08 LAB — GLUCOSE, CAPILLARY
Glucose-Capillary: 148 mg/dL — ABNORMAL HIGH (ref 70–99)
Glucose-Capillary: 162 mg/dL — ABNORMAL HIGH (ref 70–99)
Glucose-Capillary: 166 mg/dL — ABNORMAL HIGH (ref 70–99)
Glucose-Capillary: 175 mg/dL — ABNORMAL HIGH (ref 70–99)

## 2018-12-08 LAB — CBG MONITORING, ED: Glucose-Capillary: 158 mg/dL — ABNORMAL HIGH (ref 70–99)

## 2018-12-08 MED ORDER — BACITRACIN ZINC 500 UNIT/GM EX OINT
TOPICAL_OINTMENT | CUTANEOUS | Status: AC
  Filled 2018-12-08: qty 0.9

## 2018-12-08 MED ORDER — LIDOCAINE HCL (PF) 1 % IJ SOLN
30.0000 mL | Freq: Once | INTRAMUSCULAR | Status: AC
  Administered 2018-12-08: 30 mL

## 2018-12-08 MED ORDER — CEPHALEXIN 500 MG PO CAPS: 500.0000 mg | ORAL_CAPSULE | Freq: Two times a day (BID) | ORAL | 0 refills | Status: AC

## 2018-12-08 MED ORDER — BACITRACIN ZINC 500 UNIT/GM EX OINT
TOPICAL_OINTMENT | Freq: Two times a day (BID) | CUTANEOUS | Status: DC
  Administered 2018-12-08 – 2018-12-09 (×2): 1 via TOPICAL
  Administered 2018-12-10 – 2018-12-11 (×4): via TOPICAL
  Filled 2018-12-08 (×30): qty 28.35

## 2018-12-08 NOTE — Discharge Instructions (Signed)
Please read instructions below.  Keep your wound clean and covered. Soak/flush your wound with warm water, multiple times per day. You can take Advil/ibuprofen or tylenol as needed for pain. Take the antibiotics as prescribed until gone. Return to the ER for fever, worsening redness, or new or worsening symptoms.
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
## 2018-12-08 NOTE — [facility] Group Notes (Signed)
Adult Psychoeducational Group Note  Date:  12/08/2018 Time:  9:03 AM  Group Topic/Focus:  Orientation:   The focus of this group is to educate the patient on the purpose and policies of crisis stabilization and provide a format to answer questions about their admission.  The group details unit policies and expectations of patients while admitted.  Participation Level:  Active  Participation Quality:  Appropriate  Affect:  Appropriate  Cognitive:  Alert  Insight: Appropriate  Engagement in Group:  Engaged  Modes of Intervention:  Orientation  Additional Comments:   Pt participated in orientation group facilitated by [facility] Macdilla W.  Dellia NimsJaquesha M Maizey Menendez 12/08/2018, 9:03 AM

## 2018-12-08 NOTE — Progress Notes (Signed)
D: Pt denies SI/HI/AVH. Pt is pleasant and cooperative. Pt stated she was nauseous and had loose stools yesterday. Pt believed they were coming from the medications.   A: Pt was offered support and encouragement. Pt was given scheduled medications. Pt was encourage to attend groups. Q 15 minute checks were done for safety.  R:Pt attends groups and interacts well with peers and staff. Pt is taking medication. Pt has no complaints.Pt receptive to treatment and safety maintained on unit.  Problem: Education: Goal: Emotional status will improve Outcome: Progressing   Problem: Education: Goal: Mental status will improve Outcome: Progressing

## 2018-12-08 NOTE — Progress Notes (Signed)
D: Pt denies SI/HI/AVH. Pt is pleasant and cooperative. Pt visible on unit this evening, pt stated she felt ok. A: Pt was offered support and encouragement. Pt was given scheduled medications. Pt was encourage to attend groups. Q 15 minute checks were done for safety.  R:Pt attends groups and interacts well with peers and staff. Pt is taking medication. Pt has no complaints.Pt receptive to treatment and safety maintained on unit.  Problem: Education: Goal: Emotional status will improve Outcome: Progressing   Problem: Education: Goal: Mental status will improve Outcome: Progressing   Problem: Activity: Goal: Interest or engagement in activities will improve Outcome: Progressing

## 2018-12-08 NOTE — Progress Notes (Signed)
Progress note  Per [place]os MD, pt is being sent to [facility] to have her finger lanced related to increased pain and pressure. Pt has DM II and is concerned because this is "exactly what happened before my toe had to be amputated."This Clinical research associate spoke with the charge nurse Misty Stanley at [facility] and gave her report on the pt coming to her. Pt will be picked up by [facility] at 1615. This Clinical research associate was advised to tell the [facility] and pt to go to registration when they arrive.

## 2018-12-08 NOTE — ED Provider Notes (Signed)
[facility]-EMERGENCY DEPT Provider Note   CSN: [id number] Arrival date & time: 12/08/18  1625     History   Chief Complaint Chief Complaint  Patient presents with  . hang nail    HPI Rachel George is a 38 y.o. female with past medical history of insulin-dependent type 2 diabetes, depression, presenting [facility] the emergency department from [facility] with complaint of finger nail infection.  Patient states she noticed pain and swelling 3 days ago, and she has been soaking it in warm water.  Today it is more painful and feels like there is pressure behind it.  Not actively draining.  No fevers.  The history is provided by the patient.    Past Medical History:  Diagnosis Date  . Cataract   . Diabetes mellitus without complication (HCC)    dx age 65  . Fatty liver disease, nonalcoholic   . Macular degeneration   . Partial blindness     Patient Active Problem List   Diagnosis Date Noted  . Severe recurrent major depression without psychotic features (HCC) 12/06/2018  . Major depressive disorder, single episode, severe without psychosis (HCC) 11/04/2017  . Suicidal ideation 11/04/2017  . 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 Uncontrolled type 2 diabetes mellitus with hyperglycemia, with long-term current use of insulin (HCC) 05/01/2017  . Obesity, Class III, BMI 40-49.9 (morbid obesity) (HCC) 05/01/2017  . Morbid obesity with BMI of 50.0-59.9, adult (HCC) 05/01/2017  . Diabetic retinopathy associated with type 2 diabetes mellitus (HCC) 03/18/2017  . 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  . Osteomyelitis (HCC) 12/13/2016  . Diabetic foot ulcer (HCC) 10/15/2016  . Cellulitis of toe of right foot 10/07/2016  . Uncontrolled type 2 diabetes mellitus with complication (HCC) 09/09/2016  . Morbid obesity (HCC) 09/09/2016  . Diabetic polyneuropathy associated with type 2 diabetes mellitus (HCC) 09/09/2016    Past Surgical History:  Procedure  Laterality Date  . AMPUTATION TOE Right 12/16/2016   Procedure: RIGHT FIRST TOE AMPUTATION;  Surgeon: Ancil Linsey, MD;  Location: [facility];  Service: General;  Laterality: Right;  Right first toe amputation for osteomyelitis  . eye lid transplant    . RETINAL LASER PROCEDURE       OB History    Gravida  0   Para  0   Term  0   Preterm  0   AB  0   Living  0     SAB  0   TAB  0   Ectopic  0   Multiple  0   Live Births  0            Home Medications    Prior to Admission medications   Medication Sig Start Date End Date Taking? Authorizing Provider  dorzolamide-timolol (COSOPT) 22.3-6.8 MG/ML ophthalmic solution Place 1 drop into the left eye 2 times daily. 09/21/18  Yes [provider]  gabapentin (NEURONTIN) 300 MG capsule Take 1 capsule (300 mg total) by mouth 2 (two) times daily. 03/18/17  Yes Jacquelin Hawking, PA-C  ibuprofen (ADVIL,MOTRIN) 600 MG tablet Take 1 tablet (600 mg total) by mouth every 6 (six) hours as needed. 06/30/18  Yes Idol, Raynelle Fanning, PA-C  insulin aspart (NOVOLOG) 100 UNIT/ML injection Inject 9 Units into the skin 3 (three) times daily before meals. 11/13/17  Yes McNew, Ileene Hutchinson, MD  insulin detemir (LEVEMIR) 100 UNIT/ML injection Inject 0.5 mLs (50 Units total) into the skin at bedtime. Reported on 04/16/2016 11/13/17  Yes  McNew, Ileene Hutchinson, MD  losartan (COZAAR) 100 MG tablet Take by mouth.   Yes [provider]  lovastatin (MEVACOR) 20 MG tablet Take 1 tablet (20 mg total) by mouth at bedtime. 04/24/17  Yes Jacquelin Hawking, PA-C  meloxicam (MOBIC) 7.5 MG tablet Take 7.5 mg by mouth daily.    Yes [provider]  metFORMIN (GLUCOPHAGE) 1000 MG tablet Take 1,000 mg by mouth 2 (two) times daily with a meal.   Yes [provider]  cephALEXin (KEFLEX) 500 MG capsule Take 1 capsule (500 mg total) by mouth 2 (two) times daily for 5 days. 12/08/18 12/13/18  Robinson, [place] N, PA-C    Family History Family History  Problem  Relation Age of Onset  . Diabetes Father     Social History Social History   Tobacco Use  . Smoking status: Former Smoker    Packs/day: 0.25    Types: Cigarettes    Last attempt to quit: 12/11/2016    Years since quitting: 1.9  . Smokeless tobacco: Never Used  Substance Use Topics  . Alcohol use: No  . Drug use: No     Allergies   Shellfish allergy and Percocet [oxycodone-acetaminophen]   Review of Systems Review of Systems  Constitutional: Negative for fever.  Skin: Positive for color change.     Physical Exam Updated Vital Signs BP 135/90 (BP Location: Right Arm)   Pulse (!) 103   Temp 98.4 F (36.9 C) (Oral)   Resp 16   Ht 5' 6.75" (1.695 m)   Wt (!) 175.1 kg   SpO2 100%   BMI 60.91 kg/m   Physical Exam Vitals signs and nursing note reviewed.  Constitutional:      General: She is not in acute distress.    Appearance: She is well-developed. She is not ill-appearing.  HENT:     Head: Normocephalic and atraumatic.  Eyes:     Conjunctiva/sclera: Conjunctivae normal.  Cardiovascular:     Rate and Rhythm: Normal rate.  Pulmonary:     Effort: Pulmonary effort is normal.  Skin:    Capillary Refill: Capillary refill takes less than 2 seconds.     Comments: There is a paronychia to the ulnar aspect of the left fourth digit fingernail.  Associated fluctuance and localized erythema with tenderness. Not actively draining.  The pad of the finger is soft.  Neurological:     Mental Status: She is alert.  Psychiatric:        Mood and Affect: Mood normal.        Behavior: Behavior normal.      ED Treatments / Results  Labs (all labs ordered are listed, but only abnormal results are displayed) Labs Reviewed  GLUCOSE, CAPILLARY - Abnormal; Notable for the following components:      Result Value   Glucose-Capillary 204 (*)    All other components within normal limits  HEMOGLOBIN A1C - Abnormal; Notable for the following components:   Hgb A1c MFr Bld 8.6 (*)     All other components within normal limits  GLUCOSE, CAPILLARY - Abnormal; Notable for the following components:   Glucose-Capillary 173 (*)    All other components within normal limits  HEMOGLOBIN A1C - Abnormal; Notable for the following components:   Hgb A1c MFr Bld 8.6 (*)    All other components within normal limits  LIPID PANEL - Abnormal; Notable for the following components:   Triglycerides 168 (*)    All other components within normal limits  GLUCOSE, CAPILLARY - Abnormal; Notable for the following components:   Glucose-Capillary 197 (*)    All other components within normal limits  GLUCOSE, CAPILLARY - Abnormal; Notable for the following components:   Glucose-Capillary 149 (*)    All other components within normal limits  GLUCOSE, CAPILLARY - Abnormal; Notable for the following components:   Glucose-Capillary 160 (*)    All other components within normal limits  GLUCOSE, CAPILLARY - Abnormal; Notable for the following components:   Glucose-Capillary 174 (*)    All other components within normal limits  GLUCOSE, CAPILLARY - Abnormal; Notable for the following components:   Glucose-Capillary 164 (*)    All other components within normal limits  GLUCOSE, CAPILLARY - Abnormal; Notable for the following components:   Glucose-Capillary 148 (*)    All other components within normal limits  GLUCOSE, CAPILLARY - Abnormal; Notable for the following components:   Glucose-Capillary 162 (*)    All other components within normal limits  CBG MONITORING, ED - Abnormal; Notable for the following components:   Glucose-Capillary 158 (*)    All other components within normal limits  TSH    EKG None  Radiology No results found.  Procedures .Marland Kitchen.Incision and Drainage Date/Time: 12/08/2018 6:57 PM Performed by: Robinson, [place] N, PA-C Authorized by: Robinson, [place] N, PA-C   Consent:    Consent obtained:  Verbal   Consent given by:  Patient   Risks discussed:  Bleeding,  incomplete drainage and pain   Alternatives discussed:  No treatment Location:    Indications for incision and drainage: Paronychia.   Size:  0.5   Location:  Upper extremity   Upper extremity location:  Finger   Finger location:  L ring finger Pre-procedure details:    Skin preparation:  Chloraprep Anesthesia (see MAR for exact dosages):    Anesthesia method:  Nerve block   Block location:  Base of the finger, ulnar aspect   Block needle gauge:  25 G   Block anesthetic:  Lidocaine 1% w/o epi   Block injection procedure:  Anatomic landmarks palpated   Block outcome:  Anesthesia achieved Procedure type:    Complexity:  Simple Procedure details:    Incision types:  Single straight   Incision depth:  Dermal   Scalpel blade:  11   Wound management:  Irrigated with saline   Drainage:  Purulent   Drainage amount:  Moderate   Wound treatment:  Wound left open   Packing materials:  None Post-procedure details:    Patient tolerance of procedure:  Tolerated well, no immediate complications   (including critical care time)  Medications Ordered in ED Medications  alum & mag hydroxide-simeth (MAALOX/MYLANTA) 200-200-20 MG/5ML suspension 30 mL (30 mLs Oral Given 12/07/18 2202)  magnesium hydroxide (MILK OF MAGNESIA) suspension 30 mL (has no administration in time range)  dorzolamide-timolol (COSOPT) 22.3-6.8 MG/ML ophthalmic solution 1 drop (1 drop Left Eye Given 12/08/18 0802)  gabapentin (NEURONTIN) capsule 300 mg (300 mg Oral Given 12/08/18 0803)  insulin aspart (novoLOG) injection 9 Units (9 Units Subcutaneous Given 12/08/18 1208)  insulin detemir (LEVEMIR) injection 50 Units (50 Units Subcutaneous Given 12/07/18 2200)  losartan (COZAAR) tablet 100 mg (100 mg Oral Given 12/08/18 0803)  meloxicam (MOBIC) tablet 7.5 mg (7.5 mg Oral Given 12/08/18 0803)  metFORMIN (GLUCOPHAGE) tablet 1,000 mg (1,000 mg Oral Given 12/08/18 0803)  pravastatin (PRAVACHOL) tablet 40 mg (40 mg Oral Given 12/07/18 1657)    hydroxypropyl methylcellulose / hypromellose (ISOPTO TEARS / GONIOVISC) 2.5 %  ophthalmic solution 1 drop (1 drop Left Eye Not Given 12/08/18 1209)  escitalopram (LEXAPRO) tablet 10 mg (10 mg Oral Given 12/08/18 0803)  hydrOXYzine (ATARAX/VISTARIL) tablet 25 mg (25 mg Oral Given 12/06/18 2130)  diphenoxylate-atropine (LOMOTIL) 2.5-0.025 MG per tablet 1 tablet (1 tablet Oral Given 12/07/18 0900)  insulin aspart (novoLOG) injection 0-15 Units (3 Units Subcutaneous Given 12/08/18 1209)  insulin aspart (novoLOG) injection 0-5 Units (0 Units Subcutaneous Not Given 12/07/18 2133)  cloNIDine (CATAPRES) tablet 0.1 mg (has no administration in time range)  acetaminophen (TYLENOL) tablet 650 mg (650 mg Oral Given 12/08/18 0955)  lidocaine (PF) (XYLOCAINE) 1 % injection 30 mL (has no administration in time range)  Influenza vac split quadrivalent PF (FLUARIX) injection 0.5 mL (0.5 mLs Intramuscular Given 12/08/18 0958)     Initial Impression / Assessment and Plan / ED Course  I have reviewed the triage vital signs and the nursing notes.  Pertinent labs & imaging results that were available during my care of the patient were reviewed by me and considered in my medical decision making (see chart for details).     Patient with paronychia to the left fourth digit, drained at bedside without complications.  Afebrile.  Patient is an insulin-dependent type 2 diabetic, CBG today is 158.  Will discharge with short course of Keflex.  Encouraged warm water soaks.  Safe for discharge back to behavioral health hospital.  Discussed results, findings, treatment and follow up. Patient advised of return precautions. Patient verbalized understanding and agreed with plan  Final Clinical Impressions(s) / ED Diagnoses   Final diagnoses:  Paronychia of finger, left    ED Discharge Orders         Ordered    cephALEXin (KEFLEX) 500 MG capsule  2 times daily     12/08/18 1848           Robinson, [place] N, PA-C 12/08/18  1859    Linwood DibblesKnapp, Jon, MD 12/09/18 [phone]

## 2018-12-08 NOTE — ED Notes (Signed)
[facility] looked at-needs it lanced

## 2018-12-08 NOTE — [facility] Suicide Risk Assessment (Signed)
[facility] INPATIENT:  Family/Significant Other Suicide Prevention Education  Suicide Prevention Education:  Contact Attempts: father Thomasene Lot [phone] has been identified by the patient as the family member/significant other with whom the patient will be residing, and identified as the person(s) who will aid the patient in the event of a mental health crisis.  With written consent from the patient, two attempts were made to provide suicide prevention education, prior to and/or following the patient's discharge.  We were unsuccessful in providing suicide prevention education.  A suicide education pamphlet was given to the patient to share with family/significant other.  Date and time of first attempt: 12/07/2018 / 3:50pm Date and time of second attempt: 12/08/2018 / 12:09pm The call went straight to voicemail. CSW left HIPPA compliant message with callback info  Darreld Mclean 12/07/2018, 3:51 PM

## 2018-12-08 NOTE — ED Notes (Signed)
CBG 158  

## 2018-12-09 LAB — GLUCOSE, CAPILLARY
Glucose-Capillary: 153 mg/dL — ABNORMAL HIGH (ref 70–99)
Glucose-Capillary: 172 mg/dL — ABNORMAL HIGH (ref 70–99)
Glucose-Capillary: 203 mg/dL — ABNORMAL HIGH (ref 70–99)
Glucose-Capillary: 193 mg/dL — ABNORMAL HIGH (ref 70–99)

## 2018-12-09 MED ORDER — CEPHALEXIN 500 MG PO CAPS
500.0000 mg | ORAL_CAPSULE | Freq: Two times a day (BID) | ORAL | Status: DC
  Administered 2018-12-09 – 2018-12-12 (×6): 500 mg via ORAL
  Filled 2018-12-09: qty 2
  Filled 2018-12-09 (×4): qty 1
  Filled 2018-12-09: qty 2
  Filled 2018-12-09 (×6): qty 1

## 2018-12-09 NOTE — Progress Notes (Signed)
Recreation Therapy Notes  Date: 1.8.20 Time: 0930 Location: 300 Hall Dayroom  Group Topic: Stress Management  Goal Area(s) Addresses:  Patient will identify benefits of stress management. Patient will identify stress management techniques. Patient will identify benefits of using stress management post d/c.   Intervention: Stress Management  Activity :  Guided Imagery.  LRT introduced the stress management technique of guided imagery.  LRT read a script that took patients on a journey through a wildlife sanctuary.  Patients were to follow along as script was read to engage in activity.  Education:  Stress Management, Discharge Planning.   Education Outcome: Acknowledges Education  Clinical Observations/Feedback:  Pt did not attend group.    Caroll Rancher, LRT/CTRS         Caroll Rancher A 12/09/2018 10:51 AM

## 2018-12-09 NOTE — Therapy (Signed)
Occupational Therapy Group Note  Date:  12/09/2018 Time:  2:33 PM  Group Topic/Focus:  Self Esteem  Participation Level:  Active  Participation Quality:  Appropriate  Affect:  Blunted  Cognitive:  Appropriate  Insight: Improving  Engagement in Group:  Engaged  Modes of Intervention:  Activity, Discussion, Education and Socialization  Additional Comments:   S: "I am just focusing on the positive and the future, not the negative and everything bad that has happened."  O: Group began with ice breaker using beach ball to incorporate sensorimotor activity. Beach ball with reflective personal questions for pt to answer within group. Self esteem activity then completed with worksheet for pt to identify positive words about themselves for each letter of the alphabet.  A: Pt presents to group with blunted affect, engaged and participatory. Pt with low vision, requiring A to complete written activities. Pt shares that she is focusing on the positives for the future, her goals, and how to improve herself rather than dwelling on the past. Pt completed A-Z worksheet with assist from writer to write down words.  P: Handouts given to facilitate carryover. OT group will be once per week while pt acute.   Dalphine Handing, MSOT, OTR/L Behavioral Health OT/ Acute Relief OT PHP Office: [phone]  Dalphine Handing 12/09/2018, 2:33 PM
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
## 2018-12-09 NOTE — [facility] Group Notes (Signed)
[facility] Mental [employer] Association Group Therapy      12/09/2018 3:07 PM  Type of Therapy: Mental Health Association Presentation  Participation Level: Active  Participation Quality: Attentive  Affect: Appropriate  Cognitive: Oriented  Insight: Developing/Improving  Engagement in Therapy: Engaged  Modes of Intervention: Discussion, Education and Socialization  Summary of Progress/Problems: Mental Health Association (MHA) Speaker came to talk about his personal journey with mental health. The pt processed ways by which to relate to the speaker. MHA speaker provided handouts and educational information pertaining to groups and services offered by the [facility]. Pt was engaged in speaker's presentation and was receptive to resources provided.    Alcario Drought Clinical Social Worker

## 2018-12-10 DIAGNOSIS — F419 Anxiety disorder, unspecified: Secondary | ICD-10-CM

## 2018-12-10 LAB — GLUCOSE, CAPILLARY
GLUCOSE-CAPILLARY: 173 mg/dL — AB (ref 70–99)
Glucose-Capillary: 160 mg/dL — ABNORMAL HIGH (ref 70–99)
Glucose-Capillary: 201 mg/dL — ABNORMAL HIGH (ref 70–99)
Glucose-Capillary: 221 mg/dL — ABNORMAL HIGH (ref 70–99)

## 2018-12-10 NOTE — [facility] Group Notes (Signed)
Adult Psychoeducational Group Note  Date:  12/10/2018 Time:  12:18 AM  Group Topic/Focus:  Wrap-Up Group:   The focus of this group is to help patients review their daily goal of treatment and discuss progress on daily workbooks.  Participation Level:  Active  Participation Quality:  Appropriate and Attentive  Affect:  Appropriate  Cognitive:  Alert and Appropriate  Insight: Appropriate and Good  Engagement in Group:  Engaged  Modes of Intervention:  Discussion and Education  Additional Comments:  Pt attended and participated in wrap up group this evening. Pt rated their day a 10/10, due to them being productive, and doing a lot of positive things. This was the pt goal for the day as wel.Rachel George 12/10/2018, 12:18 AM

## 2018-12-10 NOTE — Plan of Care (Signed)
  Problem: Education: Goal: Knowledge of [facility] Education information/materials will improve Outcome: Progressing Goal: Emotional status will improve Outcome: Progressing Goal: Mental status will improve Outcome: Progressing   Problem: Activity: Goal: Interest or engagement in activities will improve Outcome: Progressing   Problem: Coping: Goal: Ability to demonstrate self-control will improve Outcome: Progressing   Problem: Safety: Goal: Periods of time without injury will increase Outcome: Progressing   

## 2018-12-10 NOTE — [facility] Group Notes (Signed)
LCSW Group Therapy Note 12/10/2018 2:23 PM  Type of Therapy/Topic: Group Therapy: Feelings about Diagnosis  Participation Level: Active   Description of Group:  This group will allow patients to explore their thoughts and feelings about diagnoses they have received. Patients will be guided to explore their level of understanding and acceptance of these diagnoses. Facilitator will encourage patients to process their thoughts and feelings about the reactions of others to their diagnosis and will guide patients in identifying ways to discuss their diagnosis with significant others in their lives. This group will be process-oriented, with patients participating in exploration of their own experiences, giving and receiving support, and processing challenge from other group members.  Therapeutic Goals: 1. Patient will demonstrate understanding of diagnosis as evidenced by identifying two or more symptoms of the disorder 2. Patient will be able to express two feelings regarding the diagnosis 3. Patient will demonstrate their ability to communicate their needs through discussion and/or role play  Summary of Patient Progress:  Kessie was engaged and participated throughout the group session. Tyashia reports that she feels defensive about her mental health diagnosis. She reports that she often has to defend her mental health issues with her family and that it exacerbates her depression.   Therapeutic Modalities:  Cognitive Behavioral Therapy Brief Therapy Feelings Identification    Stephanieann Popescu Catalina [place] Clinical Social Worker

## 2018-12-10 NOTE — Progress Notes (Signed)
D: Pt denies SI/HI/AVH. Pt is pleasant and cooperative. Pt stated she was doing better, pt stated she felt better about the future.   A: Pt was offered support and encouragement. Pt was given scheduled medications. Pt was encourage to attend groups. Q 15 minute checks were done for safety.   R:Pt attends groups and interacts well with peers and staff. Pt is taking medication. Pt has no complaints.Pt receptive to treatment and safety maintained on unit.   Problem: Education: Goal: Emotional status will improve Outcome: Progressing   Problem: Education: Goal: Mental status will improve Outcome: Progressing   Problem: Activity: Goal: Sleeping patterns will improve Outcome: Progressing
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
## 2018-12-10 NOTE — [facility] Group Notes (Signed)
Adult Psychoeducational Group Note  Date:  12/10/2018 Time:  10:38 PM  Group Topic/Focus:  Wrap-Up Group:   The focus of this group is to help patients review their daily goal of treatment and discuss progress on daily workbooks.  Participation Level:  Active  Participation Quality:  Appropriate and Attentive  Affect:  Appropriate  Cognitive:  Alert and Appropriate  Insight: Appropriate and Good  Engagement in Group:  Engaged  Modes of Intervention:  Discussion and Education  Additional Comments:  Pt attended and participated in wrap up group this evening. Pt rated their day an 8/10, due to them discharging tomorrow. Pt still needs to find housing once they are discharged. Pt completed their goal to reach out about housing opportunities.   Chrisandra Netters 12/10/2018, 10:38 PM

## 2018-12-10 NOTE — Plan of Care (Signed)
Progress note  D: pt found in bed; compliant with medication administration. Pt denies any physical pain, rating this a 0/10. Pt states she slept well. Pt is animated compared to days past. Pt denies any si/hi/ah/vh and verbally agrees to approach staff if these become apparent or before harming herself or others while at [facility]. Pt states her goal for today is to work on a housing situation and she will achieve this by talking to the Child psychotherapist and making call.s A: pt provided support and encouragement. Pt given medication per protocol and standing orders. R: pt safe on the unit; will continue to monitor.   Pt progressing in the following metrics  Problem: Education: Goal: Knowledge of [facility] Education information/materials will improve Outcome: Progressing Goal: Verbalization of understanding the information provided will improve Outcome: Progressing   Problem: Coping: Goal: Ability to verbalize frustrations and anger appropriately will improve Outcome: Progressing Goal: Ability to demonstrate self-control will improve Outcome: Progressing

## 2018-12-10 NOTE — Progress Notes (Signed)
D: Pt denies SI/HI/AV hallucinations. Pt is pleasant and cooperative. Pt goal for today is to work on discharge plan. A: Pt was offered support and encouragement. Pt was given scheduled medications. Pt was encourage to attend groups. Q 15 minute checks were done for safety.  R:Pt attends groups and interacts well with peers and staff. Pt is taking medication. Pt has no complaints.Pt receptive to treatment and safety maintained on unit.

## 2018-12-11 LAB — GLUCOSE, CAPILLARY
GLUCOSE-CAPILLARY: 176 mg/dL — AB (ref 70–99)
Glucose-Capillary: 152 mg/dL — ABNORMAL HIGH (ref 70–99)
Glucose-Capillary: 163 mg/dL — ABNORMAL HIGH (ref 70–99)
Glucose-Capillary: 200 mg/dL — ABNORMAL HIGH (ref 70–99)

## 2018-12-11 MED ORDER — GABAPENTIN 300 MG PO CAPS
300.0000 mg | ORAL_CAPSULE | Freq: Three times a day (TID) | ORAL | Status: DC
  Administered 2018-12-11 – 2018-12-12 (×2): 300 mg via ORAL
  Filled 2018-12-11 (×8): qty 1
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
## 2018-12-11 NOTE — Progress Notes (Signed)
Adult Psychoeducational Group Note  Date:  12/11/2018 Time:  9:05 PM  Group Topic/Focus:  Wrap-Up Group:   The focus of this group is to help patients review their daily goal of treatment and discuss progress on daily workbooks.  Participation Level:  Active  Participation Quality:  Appropriate  Affect:  Appropriate  Cognitive:  Alert  Insight: Appropriate  Engagement in Group:  Engaged  Modes of Intervention:  Discussion  Additional Comments:  Patient stated having an okay day. Patient's goal for today was to discharge. Patient stated she would have discharged today but did not due to her EKG. Patient stated she will be discharging tomorrow.  Kalijah Westfall L Broox Lonigro 12/11/2018, 9:05 PM

## 2018-12-11 NOTE — Progress Notes (Signed)
Recreation Therapy Notes  Date: 1.10.20 Time: 0930 Location: [address] Dayroom  Group Topic: Stress Management  Goal Area(s) Addresses:  Patient will identify ways to cope with stress.  Patient will identify stress management techniques. Patient will identify benefits of using stress management post d/c.  Intervention: Stress Management  Activity :  Meditation.  LRT introduced the stress management technique of meditation.  LRT played a meditation that allowed patients to focus on resiliency. Patients were to follow along as meditation played in order to engage in activity.  Education:  Stress Management, Discharge Planning.   Education Outcome: Acknowledges Education  Clinical Observations/Feedback: Pt did not attend group.    Manville Rico, LRT/CTRS         Veroncia Jezek A 12/11/2018 11:46 AM 
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
## 2018-12-11 NOTE — Progress Notes (Signed)
D: Pt was in dayroom upon initial approach.  Pt presents with appropriate affect and mood.  She reports her day was "good."  Her goal is to "discharge to a shelter tomorrow."  Pt reports she feels safe with her discharge plan.  Pt denies SI/HI, denies hallucinations, denies pain.  Pt has been visible in milieu interacting with peers and staff appropriately.  Pt attended evening group.    A: Introduced self to pt.  Met with pt 1:1.  Actively listened to pt and offered support and encouragement.  Medications administered per order.  PRN medication administered for indigestion.  Q15 minute safety checks maintained.  R: Pt is safe on the unit.  Pt is compliant with medication.  Pt verbally contracts for safety.  Will continue to monitor and assess.

## 2018-12-11 NOTE — Progress Notes (Signed)
Inpatient Diabetes Program Recommendations  AACE/ADA: New Consensus Statement on Inpatient Glycemic Control (2015)  Target Ranges:  Prepandial:   less than 140 mg/dL      Peak postprandial:   less than 180 mg/dL (1-2 hours)      Critically ill patients:  140 - 180 mg/dL   Lab Results  Component Value Date   GLUCAP 176 (H) 12/11/2018   HGBA1C 8.6 (H) 12/07/2018    Review of Glycemic Control  Diabetes history: DM2 Outpatient Diabetes medications: Levemir 50 units QHS, Novolog 9 units tidwc, metformin 1000 mg bid Current orders for Inpatient glycemic control: Levemir 50 units QHS, Novolog 0-15 units tidwc and hs + 9 units tidwc, metformin 1000 mg bid  HgbA1C - 8.6% - uncontrolled. Average blood sugar of 200 mg/dL. CBGs past 24H range 152-221 mg/dL BMI - 49.20 - weighs 100 pounds  Inpatient Diabetes Program Recommendations:     Increase Novolog to 10 units tidwc for meal coverage insulin. Pt will need to f/u with PCP regarding her uncontrolled DM. Needs OP Diabetes Education for lifestyle changes with diet and exercise. Needs to check blood sugars 4x/day and take logbook to PCP appt for MD to review. Weight-loss and exercise are extremely important for pt to control blood sugars.  Will continue to follow.  Thank you. Ailene Ards, RD, LDN, CDE Inpatient Diabetes Coordinator [phone]

## 2018-12-12 LAB — GLUCOSE, CAPILLARY
Glucose-Capillary: 136 mg/dL — ABNORMAL HIGH (ref 70–99)
Glucose-Capillary: 136 mg/dL — ABNORMAL HIGH (ref 70–99)

## 2018-12-12 MED ORDER — GABAPENTIN 300 MG PO CAPS: 300.0000 mg | ORAL_CAPSULE | Freq: Three times a day (TID) | ORAL | 0 refills | Status: DC

## 2018-12-12 MED ORDER — ESCITALOPRAM OXALATE 10 MG PO TABS: 10.0000 mg | ORAL_TABLET | Freq: Every day | ORAL | 0 refills | Status: DC

## 2018-12-12 MED ORDER — CEPHALEXIN 500 MG PO CAPS: 500.0000 mg | ORAL_CAPSULE | Freq: Two times a day (BID) | ORAL | 0 refills | Status: DC

## 2018-12-12 MED ORDER — HYDROXYZINE HCL 25 MG PO TABS: 25.0000 mg | ORAL_TABLET | Freq: Four times a day (QID) | ORAL | 0 refills | Status: DC | PRN
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
## 2018-12-12 NOTE — Progress Notes (Signed)
  [facility] Adult Case Management Discharge Plan :  Will you be returning to the same living situation after discharge:  Yes,  home At discharge, do you have transportation home?: Yes,  family  Do you have the ability to pay for your medications: Yes,  mental health  Release of information consent forms completed and submitted to medical records by CSW.   Patient to Follow up at: Follow-up Information    [facility]. Go on 12/15/2018.   Why:  Your hospital follow up appointment is Tuesday, 12/15/18 at 10:00a.  Please bring: photo ID, proof of insurance, social security card, current medicaitons and any discharge paperwork from this hospitalization. Contact information: [address] [place] [address] [phone]           Next level of care provider has access to [facility] Link:no  Safety Planning and Suicide Prevention discussed: Yes,  contact attempt x2 with pt's father. SPE completed with pt; pt declined to consent to collateral contact. SPI pamphlet and mobile crisis information provided to pt.   Has patient been referred to the Quitline?: N/A pt is not a smoker  Patient has been referred for addiction treatment: N/A  Rona Ravens, LCSW 12/12/2018, 10:30 AM
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
## 2019-03-28 ENCOUNTER — Inpatient Hospital Stay (HOSPITAL_COMMUNITY)
Admission: AD | Admit: 2019-03-28 | Discharge: 2019-03-31 | DRG: 885 | Disposition: A | Discharge status: Home / Self Care | Payer: Medicaid Other | Source: Intra-hospital | Attending: Psychiatry | Admitting: Psychiatry

## 2019-03-28 ENCOUNTER — Emergency Department (HOSPITAL_COMMUNITY)
Admission: EM | Admit: 2019-03-28 | Discharge: 2019-03-28 | Disposition: A | Discharge status: Other Acute Inpatient Hospital | Payer: Medicaid Other | Attending: Emergency Medicine | Admitting: Emergency Medicine

## 2019-03-28 ENCOUNTER — Other Ambulatory Visit: Payer: Self-pay

## 2019-03-28 ENCOUNTER — Encounter (HOSPITAL_COMMUNITY): Payer: Self-pay

## 2019-03-28 DIAGNOSIS — E1165 Type 2 diabetes mellitus with hyperglycemia: Secondary | ICD-10-CM | POA: Insufficient documentation

## 2019-03-28 DIAGNOSIS — G4700 Insomnia, unspecified: Secondary | ICD-10-CM | POA: Diagnosis present

## 2019-03-28 DIAGNOSIS — Z794 Long term (current) use of insulin: Secondary | ICD-10-CM | POA: Insufficient documentation

## 2019-03-28 DIAGNOSIS — E785 Hyperlipidemia, unspecified: Secondary | ICD-10-CM | POA: Diagnosis present

## 2019-03-28 DIAGNOSIS — Z818 Family history of other mental and behavioral disorders: Secondary | ICD-10-CM | POA: Diagnosis not present

## 2019-03-28 DIAGNOSIS — Z915 Personal history of self-harm: Secondary | ICD-10-CM | POA: Diagnosis not present

## 2019-03-28 DIAGNOSIS — I10 Essential (primary) hypertension: Secondary | ICD-10-CM | POA: Diagnosis present

## 2019-03-28 DIAGNOSIS — F332 Major depressive disorder, recurrent severe without psychotic features: Secondary | ICD-10-CM | POA: Diagnosis present

## 2019-03-28 DIAGNOSIS — F419 Anxiety disorder, unspecified: Secondary | ICD-10-CM | POA: Diagnosis present

## 2019-03-28 DIAGNOSIS — E1142 Type 2 diabetes mellitus with diabetic polyneuropathy: Secondary | ICD-10-CM | POA: Diagnosis present

## 2019-03-28 DIAGNOSIS — Z9114 Patient's other noncompliance with medication regimen: Secondary | ICD-10-CM

## 2019-03-28 DIAGNOSIS — Z833 Family history of diabetes mellitus: Secondary | ICD-10-CM

## 2019-03-28 DIAGNOSIS — Z89421 Acquired absence of other right toe(s): Secondary | ICD-10-CM | POA: Diagnosis not present

## 2019-03-28 DIAGNOSIS — Z79899 Other long term (current) drug therapy: Secondary | ICD-10-CM

## 2019-03-28 DIAGNOSIS — Z87891 Personal history of nicotine dependence: Secondary | ICD-10-CM

## 2019-03-28 DIAGNOSIS — E1140 Type 2 diabetes mellitus with diabetic neuropathy, unspecified: Secondary | ICD-10-CM | POA: Diagnosis not present

## 2019-03-28 DIAGNOSIS — R45851 Suicidal ideations: Secondary | ICD-10-CM | POA: Diagnosis present

## 2019-03-28 DIAGNOSIS — R739 Hyperglycemia, unspecified: Secondary | ICD-10-CM

## 2019-03-28 DIAGNOSIS — K760 Fatty (change of) liver, not elsewhere classified: Secondary | ICD-10-CM | POA: Diagnosis present

## 2019-03-28 DIAGNOSIS — F329 Major depressive disorder, single episode, unspecified: Secondary | ICD-10-CM | POA: Diagnosis present

## 2019-03-28 HISTORY — DX: Depression, unspecified: F32.A

## 2019-03-28 HISTORY — DX: Polyneuropathy, unspecified: G62.9

## 2019-03-28 HISTORY — DX: Major depressive disorder, single episode, unspecified: F32.9

## 2019-03-28 LAB — RAPID URINE DRUG SCREEN, HOSP PERFORMED
Amphetamines: NOT DETECTED
Barbiturates: NOT DETECTED
Benzodiazepines: NOT DETECTED
Cocaine: NOT DETECTED
Opiates: NOT DETECTED
Tetrahydrocannabinol: NOT DETECTED

## 2019-03-28 LAB — COMPREHENSIVE METABOLIC PANEL
ALT: 15 U/L (ref 0–44)
AST: 18 U/L (ref 15–41)
Albumin: 3.5 g/dL (ref 3.5–5.0)
Alkaline Phosphatase: 83 U/L (ref 38–126)
Anion gap: 7 (ref 5–15)
BUN: 17 mg/dL (ref 6–20)
CO2: 21 mmol/L — ABNORMAL LOW (ref 22–32)
Calcium: 8.5 mg/dL — ABNORMAL LOW (ref 8.9–10.3)
Chloride: 106 mmol/L (ref 98–111)
Creatinine, Ser: 0.98 mg/dL (ref 0.44–1.00)
GFR calc Af Amer: >60 mL/min (ref >60)
GFR calc non Af Amer: >60 mL/min (ref >60)
Glucose, Bld: 301 mg/dL — ABNORMAL HIGH (ref 70–99)
Potassium: 4.3 mmol/L (ref 3.5–5.1)
Sodium: 134 mmol/L — ABNORMAL LOW (ref 135–145)
Total Bilirubin: 0.3 mg/dL (ref 0.3–1.2)
Total Protein: 7.2 g/dL (ref 6.5–8.1)

## 2019-03-28 LAB — CBC WITH DIFFERENTIAL/PLATELET
Abs Immature Granulocytes: 0.01 10*3/uL (ref 0.00–0.07)
Basophils Absolute: 0 10*3/uL (ref 0.0–0.1)
Basophils Relative: 1 %
Eosinophils Absolute: 0.1 10*3/uL (ref 0.0–0.5)
Eosinophils Relative: 1 %
HCT: 41.3 % (ref 36.0–46.0)
Hemoglobin: 13.2 g/dL (ref 12.0–15.0)
Immature Granulocytes: 0 %
Lymphocytes Relative: 58 %
Lymphs Abs: 4.8 10*3/uL — ABNORMAL HIGH (ref 0.7–4.0)
MCH: 25.5 pg — ABNORMAL LOW (ref 26.0–34.0)
MCHC: 32 g/dL (ref 30.0–36.0)
MCV: 79.9 fL — ABNORMAL LOW (ref 80.0–100.0)
Monocytes Absolute: 0.5 10*3/uL (ref 0.1–1.0)
Monocytes Relative: 6 %
Neutro Abs: 2.7 10*3/uL (ref 1.7–7.7)
Neutrophils Relative %: 34 %
Platelets: 228 10*3/uL (ref 150–400)
RBC: 5.17 MIL/uL — ABNORMAL HIGH (ref 3.87–5.11)
RDW: 14.9 % (ref 11.5–15.5)
WBC: 8.1 10*3/uL (ref 4.0–10.5)
nRBC: 0 % (ref 0.0–0.2)

## 2019-03-28 LAB — GLUCOSE, CAPILLARY: Glucose-Capillary: 213 mg/dL — ABNORMAL HIGH (ref 70–99)

## 2019-03-28 LAB — SALICYLATE LEVEL: Salicylate Lvl: <7 mg/dL (ref 2.8–30.0)

## 2019-03-28 LAB — CBG MONITORING, ED
Glucose-Capillary: 309 mg/dL — ABNORMAL HIGH (ref 70–99)
Glucose-Capillary: 216 mg/dL — ABNORMAL HIGH (ref 70–99)

## 2019-03-28 LAB — POC URINE PREG, ED: Preg Test, Ur: NEGATIVE

## 2019-03-28 LAB — ACETAMINOPHEN LEVEL: Acetaminophen (Tylenol), Serum: <10 ug/mL — ABNORMAL LOW (ref 10–30)

## 2019-03-28 LAB — ETHANOL: Alcohol, Ethyl (B): <10 mg/dL (ref <10)

## 2019-03-28 MED ORDER — ALUM & MAG HYDROXIDE-SIMETH 200-200-20 MG/5ML PO SUSP
30.0000 mL | Freq: Four times a day (QID) | ORAL | Status: DC | PRN
  Administered 2019-03-28: 15:00:00 30 mL via ORAL
  Filled 2019-03-28: qty 30

## 2019-03-28 MED ORDER — IBUPROFEN 400 MG PO TABS
400.0000 mg | ORAL_TABLET | Freq: Four times a day (QID) | ORAL | Status: DC | PRN
  Administered 2019-03-28: 23:00:00 400 mg via ORAL

## 2019-03-28 MED ORDER — INSULIN DETEMIR 100 UNIT/ML ~~LOC~~ SOLN
50.0000 [IU] | Freq: Every day | SUBCUTANEOUS | Status: DC
  Administered 2019-03-28 – 2019-03-30 (×3): 50 [IU] via SUBCUTANEOUS
  Filled 2019-03-28: qty 0.5

## 2019-03-28 MED ORDER — LOSARTAN POTASSIUM 25 MG PO TABS
100.0000 mg | ORAL_TABLET | Freq: Every day | ORAL | Status: DC
  Administered 2019-03-28: 100 mg via ORAL
  Filled 2019-03-28: qty 4

## 2019-03-28 MED ORDER — METFORMIN HCL 500 MG PO TABS
1000.0000 mg | ORAL_TABLET | Freq: Two times a day (BID) | ORAL | Status: DC
  Administered 2019-03-29 – 2019-03-31 (×5): 1000 mg via ORAL
  Filled 2019-03-28 (×8): qty 2

## 2019-03-28 MED ORDER — ONDANSETRON HCL 4 MG PO TABS
4.0000 mg | ORAL_TABLET | Freq: Three times a day (TID) | ORAL | Status: DC | PRN
  Administered 2019-03-30: 4 mg via ORAL
  Filled 2019-03-28: qty 1

## 2019-03-28 MED ORDER — METFORMIN HCL 500 MG PO TABS
1000.0000 mg | ORAL_TABLET | Freq: Two times a day (BID) | ORAL | Status: DC
  Administered 2019-03-28: 1000 mg via ORAL
  Filled 2019-03-28: qty 2

## 2019-03-28 MED ORDER — INSULIN DETEMIR 100 UNIT/ML ~~LOC~~ SOLN
50.0000 [IU] | Freq: Every day | SUBCUTANEOUS | Status: DC
  Filled 2019-03-28: qty 0.5

## 2019-03-28 MED ORDER — ONDANSETRON HCL 4 MG PO TABS
4.0000 mg | ORAL_TABLET | Freq: Three times a day (TID) | ORAL | Status: DC | PRN
  Administered 2019-03-28: 15:00:00 4 mg via ORAL
  Filled 2019-03-28: qty 1

## 2019-03-28 MED ORDER — IBUPROFEN 400 MG PO TABS
ORAL_TABLET | ORAL | Status: AC
  Filled 2019-03-28: qty 1

## 2019-03-28 MED ORDER — ESCITALOPRAM OXALATE 10 MG PO TABS
10.0000 mg | ORAL_TABLET | Freq: Every day | ORAL | Status: DC
  Administered 2019-03-28: 10 mg via ORAL
  Filled 2019-03-28: qty 1

## 2019-03-28 MED ORDER — HYDROXYZINE HCL 25 MG PO TABS: 25.0000 mg | ORAL_TABLET | Freq: Four times a day (QID) | ORAL | Status: DC | PRN

## 2019-03-28 MED ORDER — ACETAMINOPHEN 325 MG PO TABS
650.0000 mg | ORAL_TABLET | ORAL | Status: DC | PRN
  Administered 2019-03-28: 15:00:00 650 mg via ORAL
  Filled 2019-03-28: qty 2

## 2019-03-28 MED ORDER — LOSARTAN POTASSIUM 50 MG PO TABS
100.0000 mg | ORAL_TABLET | Freq: Every day | ORAL | Status: DC
  Administered 2019-03-29: 08:00:00 100 mg via ORAL
  Filled 2019-03-28 (×4): qty 2

## 2019-03-28 MED ORDER — ESCITALOPRAM OXALATE 10 MG PO TABS
10.0000 mg | ORAL_TABLET | Freq: Every day | ORAL | Status: DC
  Administered 2019-03-29 – 2019-03-31 (×3): 10 mg via ORAL
  Filled 2019-03-28 (×4): qty 1

## 2019-03-28 MED ORDER — MAGNESIUM HYDROXIDE 400 MG/5ML PO SUSP: 30.0000 mL | Freq: Every day | ORAL | Status: DC | PRN

## 2019-03-28 MED ORDER — GABAPENTIN 300 MG PO CAPS
300.0000 mg | ORAL_CAPSULE | Freq: Three times a day (TID) | ORAL | Status: DC
  Administered 2019-03-28: 16:00:00 300 mg via ORAL
  Filled 2019-03-28: qty 1

## 2019-03-28 MED ORDER — INSULIN ASPART 100 UNIT/ML ~~LOC~~ SOLN
9.0000 [IU] | Freq: Three times a day (TID) | SUBCUTANEOUS | Status: DC
  Administered 2019-03-28: 9 [IU] via SUBCUTANEOUS
  Filled 2019-03-28: qty 1

## 2019-03-28 MED ORDER — INSULIN ASPART 100 UNIT/ML ~~LOC~~ SOLN
9.0000 [IU] | Freq: Three times a day (TID) | SUBCUTANEOUS | Status: DC
  Administered 2019-03-29 – 2019-03-31 (×7): 9 [IU] via SUBCUTANEOUS
  Filled 2019-03-28: qty 0.09

## 2019-03-28 MED ORDER — GABAPENTIN 300 MG PO CAPS
300.0000 mg | ORAL_CAPSULE | Freq: Three times a day (TID) | ORAL | Status: DC
  Administered 2019-03-29 – 2019-03-30 (×3): 300 mg via ORAL
  Filled 2019-03-28 (×10): qty 1

## 2019-03-28 MED ORDER — ALUM & MAG HYDROXIDE-SIMETH 200-200-20 MG/5ML PO SUSP
30.0000 mL | Freq: Four times a day (QID) | ORAL | Status: DC | PRN
  Administered 2019-03-29: 15:00:00 30 mL via ORAL
  Filled 2019-03-28: qty 30

## 2019-03-28 NOTE — ED Provider Notes (Signed)
Patient accepted by [facility] behavioral health.  Will be transferred by [facility].  Dr. Jola Babinski is excepting physician.  Patient's blood sugar improved currently 200.   Vanetta Mulders, MD 03/28/19 [phone]

## 2019-03-28 NOTE — ED Provider Notes (Signed)
[facility] EMERGENCY DEPARTMENT Provider Note   CSN: [id number] Arrival date & time: 03/28/19  1329    History   Chief Complaint Chief Complaint  Patient presents with  . Depression    HPI Earley AbideKaren Denean Ocon is a 38 y.o. female.     HPI  The patient is a 38 year old female, she is known to be morbidly obese and have diabetes.  She has had diabetes for approximately 20 years, she has both insulin requiring and taking oral medication for that.  She reports that she also has severe recurrent major depression, this does not have any psychotic features however she has required admission to the hospital in the past including several months ago.  Her last admission to the hospital was December 06, 2018.  During that admission she stayed from January fifth through January 11.  She had been diagnosed with severe recurrent depression at that time.  She denies any suicidal ideations or homicidal ideations but states that over the last couple of weeks her significant others uncontrolled paranoid schizophrenia has become decompensated.  He is using alcohol, he is agitated and she is starting to have nightmares that are making her more concerned and depressed.  Today she states that her depression became so bad that she lost control smashing a bowl, destroying her reading glasses and losing all control.  She called 911 for help before things got worse.  She also reports that she has not been taking her daily medications including her insulin, when asked why she does not do this she states that she feels like she cannot get organized, her mind is not able to focus on those tasks.  She denies hallucinations, either auditory or visual.  She does endorse having some frequent urination and excessive thirst.  There is been no dysuria, she has had mild diarrhea, no coughing chest pain or shortness of breath or fevers.  Past Medical History:  Diagnosis Date  . Cataract   . Depression   . Diabetes mellitus  without complication (HCC)    dx age 38  . Fatty liver disease, nonalcoholic   . Macular degeneration   . Neuropathy   . Partial blindness     Patient Active Problem List   Diagnosis Date Noted  . Severe recurrent major depression without psychotic features (HCC) 12/06/2018  . Major depressive disorder, single episode, severe without psychosis (HCC) 11/04/2017  . Suicidal ideation 11/04/2017  . 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 Uncontrolled type 2 diabetes mellitus with hyperglycemia, with long-term current use of insulin (HCC) 05/01/2017  . Obesity, Class III, BMI 40-49.9 (morbid obesity) (HCC) 05/01/2017  . Morbid obesity with BMI of 50.0-59.9, adult (HCC) 05/01/2017  . Diabetic retinopathy associated with type 2 diabetes mellitus (HCC) 03/18/2017  . 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  . Osteomyelitis (HCC) 12/13/2016  . Diabetic foot ulcer (HCC) 10/15/2016  . Cellulitis of toe of right foot 10/07/2016  . Uncontrolled type 2 diabetes mellitus with complication (HCC) 09/09/2016  . Morbid obesity (HCC) 09/09/2016  . Diabetic polyneuropathy associated with type 2 diabetes mellitus (HCC) 09/09/2016    Past Surgical History:  Procedure Laterality Date  . AMPUTATION TOE Right 12/16/2016   Procedure: RIGHT FIRST TOE AMPUTATION;  Surgeon: Ancil LinseyJason Evan Davis, MD;  Location: [facility];  Service: General;  Laterality: Right;  Right first toe amputation for osteomyelitis  . eye lid transplant    . RETINAL LASER PROCEDURE       OB History    [place]Gravida  0   Para  0   Term  0   Preterm  0   AB  0   Living  0     SAB  0   TAB  0   Ectopic  0   Multiple  0   Live Births  0            Home Medications    Prior to Admission medications   Medication Sig Start Date End Date Taking? Authorizing Provider  dorzolamide-timolol (COSOPT) 22.3-6.8 MG/ML ophthalmic solution Place 1 drop into the left eye 2 times daily. 09/21/18   [provider]  escitalopram (LEXAPRO)  10 MG tablet Take 1 tablet (10 mg total) by mouth daily. 12/13/18   Oneta Rack, NP  gabapentin (NEURONTIN) 300 MG capsule Take 1 capsule (300 mg total) by mouth 3 (three) times daily. 12/12/18   Oneta Rack, NP  hydrOXYzine (ATARAX/VISTARIL) 25 MG tablet Take 1 tablet (25 mg total) by mouth every 6 (six) hours as needed for anxiety. 12/12/18   Oneta Rack, NP  insulin aspart (NOVOLOG) 100 UNIT/ML injection Inject 9 Units into the skin 3 (three) times daily before meals. 11/13/17   McNew, Ileene Hutchinson, MD  insulin detemir (LEVEMIR) 100 UNIT/ML injection Inject 0.5 mLs (50 Units total) into the skin at bedtime. Reported on 04/16/2016 11/13/17   Haskell Riling, MD  losartan (COZAAR) 100 MG tablet Take by mouth.    [provider]  lovastatin (MEVACOR) 20 MG tablet Take 1 tablet (20 mg total) by mouth at bedtime. 04/24/17   Jacquelin Hawking, PA-C  metFORMIN (GLUCOPHAGE) 1000 MG tablet Take 1,000 mg by mouth 2 (two) times daily with a meal.    [provider]    Family History Family History  Problem Relation Age of Onset  . Diabetes Father     Social History Social History   Tobacco Use  . Smoking status: Former Smoker    Packs/day: 0.25    Types: Cigarettes    Last attempt to quit: 12/11/2016    Years since quitting: 2.2  . Smokeless tobacco: Never Used  Substance Use Topics  . Alcohol use: No  . Drug use: No     Allergies   Shellfish allergy and Percocet [oxycodone-acetaminophen]   Review of Systems Review of Systems  All other systems reviewed and are negative.    Physical Exam Updated Vital Signs BP (!) 147/99 (BP Location: Right Arm)   Pulse 91   Temp 98.8 F (37.1 C) (Oral)   Resp 16   Ht 1.702 m (5\' 7" )   Wt (!) 163.3 kg   LMP 03/02/2019   SpO2 98%   BMI 56.38 kg/m   Physical Exam Vitals signs and nursing note reviewed.  Constitutional:      General: She is not in acute distress.    Appearance: She is well-developed.  HENT:      Head: Normocephalic and atraumatic.     Mouth/Throat:     Pharynx: No oropharyngeal exudate.     Comments: Mucous membranes are moist, no signs of thrush Eyes:     General: No scleral icterus.       Left eye: No discharge.     Comments: Right eye totally opaque and clouded, no vision in right eye  Neck:     Musculoskeletal: Normal range of motion and neck supple.     Thyroid: No thyromegaly.     Vascular: No JVD.  Cardiovascular:  Rate and Rhythm: Normal rate and regular rhythm.     Heart sounds: Normal heart sounds. No murmur. No friction rub. No gallop.   Pulmonary:     Effort: Pulmonary effort is normal. No respiratory distress.     Breath sounds: Normal breath sounds. No wheezing or rales.  Abdominal:     General: Bowel sounds are normal. There is no distension.     Palpations: Abdomen is soft. There is no mass.     Tenderness: There is no abdominal tenderness.  Musculoskeletal: Normal range of motion.        General: No tenderness.  Lymphadenopathy:     Cervical: No cervical adenopathy.  Skin:    General: Skin is warm and dry.     Findings: No erythema or rash.  Neurological:     Mental Status: She is alert.     Coordination: Coordination normal.     Comments: The patient has normal balance, normal gait, normal speech.  She is not slurring her words.  Psychiatric:     Comments: The patient expresses severe depression, she is distraught, she is slightly disheveled, she does answer my questions appropriately, she does not appear to be responding to internal stimuli and she denies suicidal ideation.       ED Treatments / Results  Labs (all labs ordered are listed, but only abnormal results are displayed) Labs Reviewed  COMPREHENSIVE METABOLIC PANEL - Abnormal; Notable for the following components:      Result Value   Sodium 134 (*)    CO2 21 (*)    Glucose, Bld 301 (*)    Calcium 8.5 (*)    All other components within normal limits  CBC WITH  DIFFERENTIAL/PLATELET - Abnormal; Notable for the following components:   RBC 5.17 (*)    MCV 79.9 (*)    MCH 25.5 (*)    Lymphs Abs 4.8 (*)    All other components within normal limits  ACETAMINOPHEN LEVEL - Abnormal; Notable for the following components:   Acetaminophen (Tylenol), Serum <10 (*)    All other components within normal limits  CBG MONITORING, ED - Abnormal; Notable for the following components:   Glucose-Capillary 309 (*)    All other components within normal limits  ETHANOL  SALICYLATE LEVEL  RAPID URINE DRUG SCREEN, HOSP PERFORMED  POC URINE PREG, ED    EKG None  Radiology No results found.  Procedures Procedures (including critical care time)  Medications Ordered in ED Medications  acetaminophen (TYLENOL) tablet 650 mg (650 mg Oral Given 03/28/19 1452)  ondansetron (ZOFRAN) tablet 4 mg (4 mg Oral Given 03/28/19 1452)  alum & mag hydroxide-simeth (MAALOX/MYLANTA) 200-200-20 MG/5ML suspension 30 mL (30 mLs Oral Given 03/28/19 1452)  escitalopram (LEXAPRO) tablet 10 mg (has no administration in time range)  gabapentin (NEURONTIN) capsule 300 mg (has no administration in time range)  hydrOXYzine (ATARAX/VISTARIL) tablet 25 mg (has no administration in time range)  insulin aspart (novoLOG) injection 9 Units (has no administration in time range)  insulin detemir (LEVEMIR) injection 50 Units (has no administration in time range)  losartan (COZAAR) tablet 100 mg (has no administration in time range)  metFORMIN (GLUCOPHAGE) tablet 1,000 mg (has no administration in time range)     Initial Impression / Assessment and Plan / ED Course  I have reviewed the triage vital signs and the nursing notes.  Pertinent labs & imaging results that were available during my care of the patient were reviewed by me and considered in  my medical decision making (see chart for details).       Despite continually saying "I am not suicidal" and she is also saying "I am not sure what I  am and to do if this does not get fixed".  I am concerned that she has increased depression and probably qualifies for an inpatient stay however the patient is also an uncontrolled diabetic and will require some treatment in the emergency department which may include insulin.  Labs have been ordered, the patient otherwise appears medically stabilized and at this time has a clear sensorium and can speak with psychiatry as of 1:55 PM.  Discussed with behavioral health evaluation team around 2:45 PM, they agree that the patient meets criteria for inpatient treatment.  I have looked over the patient's home medications and reordered them, she is minimally hyperglycemic and has no signs of an acidosis.  She has not taken any coingestants including aspirin or Tylenol, the metabolic panel is otherwise reassuring and no signs of an increased anion gap.  Change of shift, care signed out to Dr. Deretha Emory to follow-up psychiatric recommendations on placement.  Final Clinical Impressions(s) / ED Diagnoses   Final diagnoses:  Severe episode of recurrent major depressive disorder, without psychotic features [facility])  Hyperglycemia    ED Discharge Orders    None       Eber Hong, MD 03/28/19 1511

## 2019-03-28 NOTE — Tx Team (Signed)
Initial Treatment Plan 03/28/2019 9:50 PM Jordynn Foshee KPQ:[id number]    PATIENT STRESSORS: Health problems Medication change or noncompliance   PATIENT STRENGTHS: Ability for insight Average or above average intelligence Motivation for treatment/growth Supportive family/friends   PATIENT IDENTIFIED PROBLEMS: Depression  SI        " I need to get my medicines right."           DISCHARGE CRITERIA:  Improved stabilization in mood, thinking, and/or behavior  PRELIMINARY DISCHARGE PLAN: Return to previous living arrangement  PATIENT/FAMILY INVOLVEMENT: This treatment plan has been presented to and reviewed with the patient, Nikkita Eshleman, and/or family member.  The patient and family have been given the opportunity to ask questions and make suggestions.  Floyce Stakes, RN 03/28/2019, 9:50 PM

## 2019-03-28 NOTE — ED Notes (Signed)
[facility] called at [employer], will be here in approx. 35 mins

## 2019-03-28 NOTE — ED Notes (Signed)
Spoke to [facility]. They are now monitoring patient via camera 2 in patient's room.

## 2019-03-28 NOTE — ED Triage Notes (Addendum)
Patient states she is depressed due to relationship issues with husband. Husband has paranoid schizophrenia and "says things that trigger depression" in patient. Husband is accusing patient of being "out to get him". Patient states she has not taken her depression medication or diabetic medication in a month. Patient denies SI/HI thoughts at this time but states she doesn't know what she is capable of if she doesn't get help. 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 Patient broke her eye glasses and a ceramic bowl in fit of anger today. Small cut on finger. Denies any other injuries. Patient calm and cooperative at this time.

## 2019-03-28 NOTE — BH Assessment (Signed)
Pt found to need inpatient treatment, accepted to [facility].  Accepting Minerva Areola NP Attending Dr. Jola Babinski Room- [phone]. Pt diabetic, to eat dinner and then transfer at 1915.  Voluntary consent to be signed and faxed to [phone]. Report [phone]

## 2019-03-28 NOTE — BH Assessment (Signed)
Assessment Note  Rachel George is an 38 y.o. female who reports calling 911 after becoming upset with Spouse, who is not medication compliant for paranoid Schizophrenia and is also comsuming alcohol.  Patient reports becoming "manic and losing it" and broke her glasses and a bowl in response to her Spouse's paranoid behavior.  Patient presented orientated x4, mood "depressed and anxious" affect congruent with mood.  Patient denied current SI and reports passive SI prior to "manic" episode in which she thought "I rather not be here."  Patient denied HI and AVH.  Patient reports wanting to celebrate marriage anniversary on 03-25-2019 and [place] OuchBirthday on 03-26-2019, however Spouse was paranoid and intoxicated.  Patient reports starting to feel depressed and anxious at that time. She reports the following depressive symptoms; hopelessness, helplessness, inability to focus, anger and irritability, difficulty sleeping, and difficulty focusing.   Patient reports depression started in 2018 when she loss some eye sight from complication of Diabetes and was put on disability.  [facility] for MDD and SI.  [facility] to 12-12-2018 for MDD and SI.  Patient reports not following up with discharge plan of outpatient MH medication management and talk therapy.  Patient reports not taking Hydroxyzine and Lexapro  along with diabetic medications since March 2020.  The Patient requests to be restarted on medication and inpatient hospitalization for stabilization,.  Per Nanine MeansJamison Lord, NP:  Patient meets inpatient psychiatric criteria and placement should be sought.  Patient's disposition provided to Dr. Hyacinth MeekerMiller.             Diagnosis:  Major Depressive Disorder, recurrent, severe, without psychosis  Past Medical History:  Past Medical History:  Diagnosis Date  . Cataract   . Depression   . Diabetes mellitus without  complication (HCC)    dx age 38  . Fatty liver disease, nonalcoholic   . Macular degeneration   . Neuropathy   . Partial blindness     Past Surgical History:  Procedure Laterality Date  . AMPUTATION TOE Right 12/16/2016   Procedure: RIGHT FIRST TOE AMPUTATION;  Surgeon: Ancil LinseyJason Evan Davis, MD;  Location: [facility];  Service: General;  Laterality: Right;  Right first toe amputation for osteomyelitis  . eye lid transplant    . RETINAL LASER PROCEDURE      Family History:  Family History  Problem Relation Age of Onset  . Diabetes Father     Social History:  reports that she quit smoking about 2 years ago. Her smoking use included cigarettes. She smoked 0.25 packs per day. She has never used smokeless tobacco. She reports that she does not drink alcohol or use drugs.  Additional Social History:     CIWA: CIWA-Ar BP: (!) 147/99 Pulse Rate: 91 COWS:    Allergies:  Allergies  Allergen Reactions  . Shellfish Allergy Anaphylaxis, Shortness Of Breath and Swelling    Facial Swelling  . Percocet [Oxycodone-Acetaminophen] Itching and Nausea And Vomiting    Home Medications: (Not in a hospital admission)   OB/GYN Status:  Patient's last menstrual period was 03/02/2019.  General Assessment Data Location of Assessment: [facility] ED TTS Assessment: In system Is this a Tele or Face-to-Face Assessment?: Tele Assessment Is this an Initial Assessment or a Re-assessment for this encounter?: Initial Assessment Patient Accompanied by:: N/A Language Other than English: No What gender do you identify as?: Female Marital status: Married Rachel FairlyMaiden name: Ladona Ridgelaylor Pregnancy Status: Unknown Living Arrangements:  Spouse/significant other Can pt return to current living arrangement?: Yes Admission Status: Voluntary Is patient capable of signing voluntary admission?: Yes Referral Source: Self/Family/Friend Insurance type: Medicaid   Medical Screening Exam [facility] [employer] Walk-in ONLY) Medical Exam completed:  Yes  Crisis Care Plan Living Arrangements: Spouse/significant other Name of Psychiatrist: None Name of Therapist: None  Education Status Is patient currently in school?: No Is the patient employed, unemployed or receiving disability?: Receiving disability income(SSDI)  Risk to self with the past 6 months Suicidal Ideation: No-Not Currently/Within Last 6 Months(Oassive SI earlier today) Has patient been a risk to self within the past 6 months prior to admission? : Yes Suicidal Intent: No-Not Currently/Within Last 6 Months Has patient had any suicidal intent within the past 6 months prior to admission? : Yes Is patient at risk for suicide?: Yes Suicidal Plan?: No-Not Currently/Within Last 6 Months Has patient had any suicidal plan within the past 6 months prior to admission? : Yes Access to Means: No What has been your use of drugs/alcohol within the last 12 months?: Patient denied Previous Attempts/Gestures: No Family Suicide History: No Recent stressful life event(s): Other (Comment), Recent negative physical changes(Spouse' MH and SA use) Persecutory voices/beliefs?: No Depression: Yes Depression Symptoms: Feeling worthless/self pity, Feeling angry/irritable, Loss of interest in usual pleasures, Insomnia Substance abuse history and/or treatment for substance abuse?: No Suicide prevention information given to non-admitted patients: Not applicable  Risk to Others within the past 6 months Homicidal Ideation: No Does patient have any lifetime risk of violence toward others beyond the six months prior to admission? : No Thoughts of Harm to Others: No Current Homicidal Intent: No Current Homicidal Plan: No Access to Homicidal Means: No History of harm to others?: No Assessment of Violence: None Noted Does patient have access to weapons?: No(Patient denied) Criminal Charges Pending?: No Does patient have a court date: No Is patient on probation?: No  Psychosis Hallucinations:  None noted Delusions: None noted  Mental Status Report Appearance/Hygiene: In scrubs Eye Contact: Good Motor Activity: Unremarkable Speech: Logical/coherent Level of Consciousness: Alert Mood: Depressed, Anxious Affect: Other (Comment)(congruent with mood) Anxiety Level: Moderate Thought Processes: Coherent, Relevant Judgement: Impaired Orientation: Person, Place, Time, Situation Obsessive Compulsive Thoughts/Behaviors: None  Cognitive Functioning Concentration: Decreased Memory: Recent Intact, Remote Intact Is patient IDD: No Insight: Poor Impulse Control: Poor Appetite: Fair Have you had any weight changes? : No Change Sleep: Decreased Total Hours of Sleep: 4 Vegetative Symptoms: None  ADLScreening [facility] Assessment Services) Patient's cognitive ability adequate to safely complete daily activities?: Yes Patient able to express need for assistance with ADLs?: Yes Independently performs ADLs?: Yes (appropriate for developmental age)  Prior Inpatient Therapy Prior Inpatient Therapy: Yes Prior Therapy Dates: 12-06-2018 to 12-12-2018 Prior Therapy Facilty/Provider(s): [facility] Reason for Treatment: MDD and SI  Prior Outpatient Therapy Prior Outpatient Therapy: No Does patient have an ACCT team?: No Does patient have Intensive In-House Services?  : No Does patient have [facility] services? : No Does patient have P4CC services?: No  ADL Screening (condition at time of admission) Patient's cognitive ability adequate to safely complete daily activities?: Yes Is the patient deaf or have difficulty hearing?: No Does the patient have difficulty seeing, even when wearing glasses/contacts?: Yes(Patient reports reduced eye sight due to complications of diabetes) Does the patient have difficulty concentrating, remembering, or making decisions?: No Patient able to express need for assistance with ADLs?: Yes Does the patient have difficulty dressing or bathing?: No Independently  performs ADLs?: Yes (appropriate  for developmental age) Does the patient have difficulty walking or climbing stairs?: Yes(Patient morbitly obese and had a toe amputed ) Weakness of Legs: None Weakness of Arms/Hands: None  Home Assistive Devices/Equipment Home Assistive Devices/Equipment: Eyeglasses      Values / Beliefs Cultural Requests During Hospitalization: None Spiritual Requests During Hospitalization: None   Advance Directives (For Healthcare) Does Patient Have a Medical Advance Directive?: No(Patient declined) Would patient like information on creating a medical advance directive?: No - Guardian declined          Disposition:  Disposition Initial Assessment Completed for this Encounter: Yes Disposition of Patient: Admit Type of inpatient treatment program: Adult  On Site Evaluation by:   Reviewed with Physician:    Dey-Johnson,Amay Mijangos 03/28/2019 2:38 PM

## 2019-03-29 DIAGNOSIS — F332 Major depressive disorder, recurrent severe without psychotic features: Principal | ICD-10-CM

## 2019-03-29 LAB — GLUCOSE, CAPILLARY
Glucose-Capillary: 202 mg/dL — ABNORMAL HIGH (ref 70–99)
Glucose-Capillary: 221 mg/dL — ABNORMAL HIGH (ref 70–99)
Glucose-Capillary: 210 mg/dL — ABNORMAL HIGH (ref 70–99)
Glucose-Capillary: 131 mg/dL — ABNORMAL HIGH (ref 70–99)

## 2019-03-29 MED ORDER — INSULIN DETEMIR 100 UNIT/ML ~~LOC~~ SOLN: 50.0000 [IU] | Freq: Every day | SUBCUTANEOUS | Status: DC

## 2019-03-29 MED ORDER — DOXEPIN HCL 25 MG PO CAPS
25.0000 mg | ORAL_CAPSULE | Freq: Every day | ORAL | Status: DC
  Administered 2019-03-29: 25 mg via ORAL
  Filled 2019-03-29 (×3): qty 1

## 2019-03-29 MED ORDER — POLYVINYL ALCOHOL 1.4 % OP SOLN: 2.0000 [drp] | OPHTHALMIC | Status: DC | PRN

## 2019-03-29 MED ORDER — DORZOLAMIDE HCL-TIMOLOL MAL 2-0.5 % OP SOLN: 1.0000 [drp] | Freq: Two times a day (BID) | OPHTHALMIC | Status: DC

## 2019-03-29 MED ORDER — FOLIC ACID 1 MG PO TABS
1.0000 mg | ORAL_TABLET | Freq: Every day | ORAL | Status: DC
  Administered 2019-03-30 – 2019-03-31 (×2): 1 mg via ORAL
  Filled 2019-03-29 (×4): qty 1

## 2019-03-29 MED ORDER — PRAVASTATIN SODIUM 40 MG PO TABS
40.0000 mg | ORAL_TABLET | Freq: Every day | ORAL | Status: DC
  Administered 2019-03-29 – 2019-03-30 (×2): 40 mg via ORAL
  Filled 2019-03-29 (×3): qty 1

## 2019-03-29 MED ORDER — INSULIN ASPART 100 UNIT/ML ~~LOC~~ SOLN: 0.0000 [IU] | Freq: Every day | SUBCUTANEOUS | Status: DC

## 2019-03-29 MED ORDER — INSULIN ASPART 100 UNIT/ML ~~LOC~~ SOLN
9.0000 [IU] | Freq: Three times a day (TID) | SUBCUTANEOUS | Status: DC
  Administered 2019-03-29: 12:00:00 9 [IU] via SUBCUTANEOUS

## 2019-03-29 MED ORDER — VITAMIN B-1 100 MG PO TABS
100.0000 mg | ORAL_TABLET | Freq: Every day | ORAL | Status: DC
  Administered 2019-03-30 – 2019-03-31 (×2): 100 mg via ORAL
  Filled 2019-03-29 (×4): qty 1

## 2019-03-29 MED ORDER — INSULIN ASPART 100 UNIT/ML ~~LOC~~ SOLN
0.0000 [IU] | Freq: Three times a day (TID) | SUBCUTANEOUS | Status: DC
  Administered 2019-03-29: 7 [IU] via SUBCUTANEOUS
  Administered 2019-03-30: 07:00:00 3 [IU] via SUBCUTANEOUS
  Administered 2019-03-30 (×2): 4 [IU] via SUBCUTANEOUS
  Administered 2019-03-31 (×2): 3 [IU] via SUBCUTANEOUS

## 2019-03-29 MED ORDER — LOPERAMIDE HCL 2 MG PO CAPS
2.0000 mg | ORAL_CAPSULE | ORAL | Status: DC | PRN
  Administered 2019-03-29 – 2019-03-30 (×2): 2 mg via ORAL
  Filled 2019-03-29 (×2): qty 1

## 2019-03-29 MED ORDER — INSULIN ASPART 100 UNIT/ML ~~LOC~~ SOLN
0.0000 [IU] | Freq: Three times a day (TID) | SUBCUTANEOUS | Status: DC
  Administered 2019-03-29: 7 [IU] via SUBCUTANEOUS

## 2019-03-29 NOTE — Progress Notes (Signed)
Recreation Therapy Notes  Date:  4.27.20 Time: 0930 Location: [address] Dayroom  Group Topic: Stress Management  Goal Area(s) Addresses:  Patient will identify positive stress management techniques. Patient will identify benefits of using stress management post d/c.  Intervention: Stress Management  Activity :  Meditation.  LRT introduced the stress management technique of meditation.  LRT played a meditation that dealt with being resilient in the face of adversity.  Patients were to listen and follow along as meditation played.  Education:  Stress Management, Discharge Planning.   Education Outcome: Acknowledges Education  Clinical Observations/Feedback:  Pt did not attend group.     Caroll Rancher, LRT/CTRS         Lillia Abed, Gery Sabedra A 03/29/2019 11:11 AM

## 2019-03-29 NOTE — Progress Notes (Signed)
Adult Psychoeducational Group Note  Date:  03/29/2019 Time:  10:38 AM  Group Topic/Focus:  Developing a Wellness Toolbox:   The focus of this group is to help patients develop a "wellness toolbox" with skills and strategies to promote recovery upon discharge.  Participation Level:  Active  Participation Quality:  Appropriate  Affect:  Appropriate  Cognitive:  Alert  Insight: Appropriate  Engagement in Group:  Engaged  Modes of Intervention:  Discussion and Education  Additional Comments:  Pt attended group and shared positively.  Ahmyah Gidley E 03/29/2019, 10:38 AM

## 2019-03-29 NOTE — Progress Notes (Signed)
Inpatient Diabetes Program Recommendations  AACE/ADA: New Consensus Statement on Inpatient Glycemic Control (2015)  Target Ranges:  Prepandial:   less than 140 mg/dL      Peak postprandial:   less than 180 mg/dL (1-2 hours)      Critically ill patients:  140 - 180 mg/dL   Lab Results  Component Value Date   GLUCAP 221 (H) 03/29/2019   HGBA1C 8.6 (H) 12/07/2018    Review of Glycemic Control  Diabetes history: DM 2 Outpatient Diabetes medications: Levemir 50 units qhs, Novolog 9 units tid meal coverage, Metformin 1000 mg bid Current orders for Inpatient glycemic control: Levemir 50 units qhs, Novolog 0-5 units qhs, Novolog 9 units tid meal coverage, Metformin 1000 mg bid  Inpatient Diabetes Program Recommendations:    Noted Novolog 0-5 units hs scale ordered. Glucose trends in the 200 range. Please consider adding Novolog 0-20 units tid correction scale.  Thanks,  Christena Deem RN, MSN, BC-ADM Inpatient Diabetes Coordinator Team Pager [phone] (8a-5p)

## 2019-03-29 NOTE — Tx Team (Signed)
Interdisciplinary Treatment and Diagnostic Plan Update  03/29/2019 Time of Session:  Rachel George MRN: [id number]  Principal Diagnosis: <principal problem not specified>  Secondary Diagnoses: Active Problems:   Major depressive disorder, recurrent severe without psychotic features (Short)   Current Medications:  Current Facility-Administered Medications  Medication Dose Route Frequency Provider Last Rate Last Dose  . alum & mag hydroxide-simeth (MAALOX/MYLANTA) 200-200-20 MG/5ML suspension 30 mL  30 mL Oral Q6H PRN Patrecia Pour, NP      . escitalopram (LEXAPRO) tablet 10 mg  10 mg Oral Daily Patrecia Pour, NP   10 mg at 03/29/19 0804  . gabapentin (NEURONTIN) capsule 300 mg  300 mg Oral TID Patrecia Pour, NP   300 mg at 03/29/19 0804  . hydrOXYzine (ATARAX/VISTARIL) tablet 25 mg  25 mg Oral Q6H PRN Patrecia Pour, NP      . ibuprofen (ADVIL) tablet 400 mg  400 mg Oral Q6H PRN Lindon Romp A, NP   400 mg at 03/28/19 2255  . insulin aspart (novoLOG) injection 9 Units  9 Units Subcutaneous TID AC Patrecia Pour, NP   9 Units at 03/29/19 [phone]  . insulin detemir (LEVEMIR) injection 50 Units  50 Units Subcutaneous QHS Patrecia Pour, NP   50 Units at 03/28/19 2100  . losartan (COZAAR) tablet 100 mg  100 mg Oral Daily Patrecia Pour, NP   100 mg at 03/29/19 0804  . magnesium hydroxide (MILK OF MAGNESIA) suspension 30 mL  30 mL Oral Daily PRN Patrecia Pour, NP      . metFORMIN (GLUCOPHAGE) tablet 1,000 mg  1,000 mg Oral BID WC Patrecia Pour, NP   1,000 mg at 03/29/19 0804  . ondansetron (ZOFRAN) tablet 4 mg  4 mg Oral Q8H PRN Patrecia Pour, NP       PTA Medications: Medications Prior to Admission  Medication Sig Dispense Refill Last Dose  . dorzolamide-timolol (COSOPT) 22.3-6.8 MG/ML ophthalmic solution Place 1 drop into the left eye 2 times daily.   Past Month at Unknown time  . escitalopram (LEXAPRO) 10 MG tablet Take 1 tablet (10 mg total) by mouth daily. 30 tablet 0    . gabapentin (NEURONTIN) 300 MG capsule Take 1 capsule (300 mg total) by mouth 3 (three) times daily. 90 capsule 0   . hydrOXYzine (ATARAX/VISTARIL) 25 MG tablet Take 1 tablet (25 mg total) by mouth every 6 (six) hours as needed for anxiety. 30 tablet 0   . insulin aspart (NOVOLOG) 100 UNIT/ML injection Inject 9 Units into the skin 3 (three) times daily before meals. 10 mL 11 12/05/2018 at Unknown time  . insulin detemir (LEVEMIR) 100 UNIT/ML injection Inject 0.5 mLs (50 Units total) into the skin at bedtime. Reported on 04/16/2016 10 mL 11 12/05/2018 at Unknown time  . losartan (COZAAR) 100 MG tablet Take 100 mg by mouth daily.    12/05/2018 at Unknown time  . lovastatin (MEVACOR) 20 MG tablet Take 1 tablet (20 mg total) by mouth at bedtime. 30 tablet 3 12/05/2018 at Unknown time  . meloxicam (MOBIC) 7.5 MG tablet Take 7.5 mg by mouth daily.     . metFORMIN (GLUCOPHAGE) 1000 MG tablet Take 1,000 mg by mouth 2 (two) times daily with a meal.   12/05/2018 at Unknown time    Patient Stressors: Health problems Medication change or noncompliance  Patient Strengths: Ability for insight Average or above average intelligence Motivation for treatment/growth Supportive family/friends  Treatment Modalities: Medication Management,  Group therapy, Case management,  1 to 1 session with clinician, Psychoeducation, Recreational therapy.   Physician Treatment Plan for Primary Diagnosis: <principal problem not specified> Long Term Goal(s):     Short Term Goals:    Medication Management: Evaluate patient's response, side effects, and tolerance of medication regimen.  Therapeutic Interventions: 1 to 1 sessions, Unit Group sessions and Medication administration.  Evaluation of Outcomes: Not Met  Physician Treatment Plan for Secondary Diagnosis: Active Problems:   Major depressive disorder, recurrent severe without psychotic features (HCC)  Long Term Goal(s):     Short Term Goals:       Medication  Management: Evaluate patient's response, side effects, and tolerance of medication regimen.  Therapeutic Interventions: 1 to 1 sessions, Unit Group sessions and Medication administration.  Evaluation of Outcomes: Not Met   RN Treatment Plan for Primary Diagnosis: <principal problem not specified> Long Term Goal(s): Knowledge of disease and therapeutic regimen to maintain health will improve  Short Term Goals: Ability to participate in decision making will improve, Ability to verbalize feelings will improve, Ability to disclose and discuss suicidal ideas, Ability to identify and develop effective coping behaviors will improve and Compliance with prescribed medications will improve  Medication Management: RN will administer medications as ordered by provider, will assess and evaluate patient's response and provide education to patient for prescribed medication. RN will report any adverse and/or side effects to prescribing provider.  Therapeutic Interventions: 1 on 1 counseling sessions, Psychoeducation, Medication administration, Evaluate responses to treatment, Monitor vital signs and CBGs as ordered, Perform/monitor CIWA, COWS, AIMS and Fall Risk screenings as ordered, Perform wound care treatments as ordered.  Evaluation of Outcomes: Not Met   LCSW Treatment Plan for Primary Diagnosis: <principal problem not specified> Long Term Goal(s): Safe transition to appropriate next level of care at discharge, Engage patient in therapeutic group addressing interpersonal concerns.  Short Term Goals: Engage patient in aftercare planning with referrals and resources  Therapeutic Interventions: Assess for all discharge needs, 1 to 1 time with Social worker, Explore available resources and support systems, Assess for adequacy in community support network, Educate family and significant other(s) on suicide prevention, Complete Psychosocial Assessment, Interpersonal group therapy.  Evaluation of Outcomes:  Not Met   Progress in Treatment: Attending groups: No. New to unit  Participating in groups: No. Taking medication as prescribed: Yes. Toleration medication: Yes. Family/Significant other contact made: No, will contact:  patient declined consent for collateral contacts Patient understands diagnosis: Yes. Discussing patient identified problems/goals with staff: Yes. Medical problems stabilized or resolved: Yes. Denies suicidal/homicidal ideation: Yes. Issues/concerns per patient self-inventory: No. Other:   New problem(s) identified: None   New Short Term/Long Term Goal(s):medication stabilization, elimination of SI thoughts, development of comprehensive mental wellness plan.    Patient Goals:  I need to get my medicines right   Discharge Plan or Barriers: Patient plans to discharge home with her spouse and will follow up with [facility] for medication management and therapy services. CSW will continue to follow and assess for any additional referrals and/or discharge planning.   Reason for Continuation of Hospitalization: Depression Medication stabilization Suicidal ideation  Estimated Length of Stay: 3-5 days   Attendees: Patient: 03/29/2019 8:57 AM  Physician: Dr. Greg Clary, MD 03/29/2019 8:57 AM  Nursing: Diane.B, RN 03/29/2019 8:57 AM  RN Care Manager: 03/29/2019 8:57 AM  Social Worker: Jolan Williams, LCSWA 03/29/2019 8:57 AM  Recreational Therapist:  03/29/2019 8:57 AM  Other:  03/29/2019 8:57 AM  Other:  03/29/2019   8:57 AM  Other: 03/29/2019 8:57 AM    Scribe for Treatment Team: Marylee Floras, [place] 03/29/2019 8:57 AM
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
## 2019-03-29 NOTE — [facility] Suicide Risk Assessment (Signed)
[facility] INPATIENT:  Family/Significant Other Suicide Prevention Education  Suicide Prevention Education:   Patient Refusal for Family/Significant Other Suicide Prevention Education: The patient Rachel George has refused to provide written consent for family/significant other to be provided Family/Significant Other Suicide Prevention Education during admission and/or prior to discharge.  Physician notified.  SPE completed with patient, as patient refused to consent to family contact. SPI pamphlet provided to pt and pt was encouraged to share information with support network, ask questions, and talk about any concerns relating to SPE. Patient denies access to guns/firearms and verbalized understanding of information provided. Mobile Crisis information also provided to patient.    [name] 03/29/2019, 11:26 AM

## 2019-03-29 NOTE — Progress Notes (Signed)
Pt completed self-inventory today and indicates that she slept good last night and her appetite is fair. Her concentration is good and her energy level is normal. Continues to report depression, feelings of hopelessness and anxiety. Denies alcohol or drug withdrawal and denies SH today. She has been cooperative. Goal is to attend groups and she will accomplish this by making sure to attend classes.

## 2019-03-30 LAB — HEMOGLOBIN A1C
Hgb A1c MFr Bld: 11.3 % — ABNORMAL HIGH (ref 4.8–5.6)
Mean Plasma Glucose: 277.61 mg/dL

## 2019-03-30 LAB — GLUCOSE, CAPILLARY
Glucose-Capillary: 140 mg/dL — ABNORMAL HIGH (ref 70–99)
Glucose-Capillary: 189 mg/dL — ABNORMAL HIGH (ref 70–99)
Glucose-Capillary: 154 mg/dL — ABNORMAL HIGH (ref 70–99)
Glucose-Capillary: 133 mg/dL — ABNORMAL HIGH (ref 70–99)

## 2019-03-30 MED ORDER — GABAPENTIN 300 MG PO CAPS
300.0000 mg | ORAL_CAPSULE | Freq: Two times a day (BID) | ORAL | Status: DC
  Administered 2019-03-31: 300 mg via ORAL
  Filled 2019-03-30 (×3): qty 1

## 2019-03-30 NOTE — Plan of Care (Signed)
  Problem: Coping: Goal: Coping ability will improve Outcome: Progressing Goal: Will verbalize feelings Outcome: Progressing   

## 2019-03-30 NOTE — Progress Notes (Addendum)
[facility] MD Progress Note  03/30/2019 1:29 PM Rachel George  MRN:  [id number] Subjective:  Reports that today she is feeling better. States " My mood is better, I feel less anxious, and I slept better yesterday". Denies suicidal ideations.  Objective : I have discussed case with treatment team and have met with patient. 38 year old , history of depression, DM, history of prior psychiatric admission in January 2020 . Presented for worsening depression, passive SI, anxiety, feeling overwhelmed in the context of argument with her spouse and being off her medications for about a month. States " because of this coronavirus, transportation has been less reliable and I have been scared of going out much" leading to med non compliance .  Today reports she is feeling better, less depressed, and today denies suicidal ideations. Denies medication side effects, other than some sedation with PM Neurontin dose  4/28 HgbA1C 11.3, CBG today 189. No disruptive or agitated behaviors on unit at this time.    Objective :  Principal Problem:  MDD Diagnosis: Active Problems:   Major depressive disorder, recurrent severe without psychotic features ([place])  Total Time spent with patient: 20 minutes  Past Psychiatric History:   Past Medical History:  Past Medical History:  Diagnosis Date  . Cataract   . Depression   . Diabetes mellitus without complication (HCC)    dx age 89  . Fatty liver disease, nonalcoholic   . Macular degeneration   . Neuropathy   . Partial blindness     Past Surgical History:  Procedure Laterality Date  . AMPUTATION TOE Right 12/16/2016   Procedure: RIGHT FIRST TOE AMPUTATION;  Surgeon: Vickie Epley, MD;  Location: [facility];  Service: General;  Laterality: Right;  Right first toe amputation for osteomyelitis  . eye lid transplant    . RETINAL LASER PROCEDURE     Family History:  Family History  Problem Relation Age of Onset  . Diabetes Father    Family Psychiatric   History:  Social History:  Social History   Substance and Sexual Activity  Alcohol Use No     Social History   Substance and Sexual Activity  Drug Use No    Social History   Socioeconomic History  . Marital status: Married    Spouse name: Ashliegh Parekh  . Number of children: Not on file  . Years of education: Not on file  . Highest education level: Not on file  Occupational History  . Occupation: CNA personal care giver  Social Needs  . Financial resource strain: Patient refused  . Food insecurity:    Worry: Patient refused    Inability: Patient refused  . Transportation needs:    Medical: Not on file    Non-medical: Not on file  Tobacco Use  . Smoking status: Former Smoker    Packs/day: 0.25    Types: Cigarettes    Last attempt to quit: 12/11/2016    Years since quitting: 2.2  . Smokeless tobacco: Never Used  Substance and Sexual Activity  . Alcohol use: No  . Drug use: No  . Sexual activity: Yes    Partners: Male    Birth control/protection: None  Lifestyle  . Physical activity:    Days per week: Patient refused    Minutes per session: Patient refused  . Stress: Patient refused  Relationships  . Social connections:    Talks on phone: Patient refused    Gets together: Patient refused    Attends religious service:  Patient refused    Active member of club or organization: Not on file    Attends meetings of clubs or organizations: Not on file    Relationship status: Patient refused  Other Topics Concern  . Not on file  Social History Narrative  . Not on file   Additional Social History:   Sleep: improving   Appetite:  improving  Current Medications: Current Facility-Administered Medications  Medication Dose Route Frequency Provider Last Rate Last Dose  . alum & mag hydroxide-simeth (MAALOX/MYLANTA) 200-200-20 MG/5ML suspension 30 mL  30 mL Oral Q6H PRN Patrecia Pour, NP   30 mL at 03/29/19 1503  . doxepin (SINEQUAN) capsule 25 mg  25 mg Oral QHS  Sharma Covert, MD   25 mg at 03/29/19 2139  . escitalopram (LEXAPRO) tablet 10 mg  10 mg Oral Daily Patrecia Pour, NP   10 mg at 03/30/19 1014  . folic acid (FOLVITE) tablet 1 mg  1 mg Oral Daily Sharma Covert, MD   1 mg at 03/30/19 1014  . gabapentin (NEURONTIN) capsule 300 mg  300 mg Oral TID Patrecia Pour, NP   300 mg at 03/30/19 1014  . hydrOXYzine (ATARAX/VISTARIL) tablet 25 mg  25 mg Oral Q6H PRN Patrecia Pour, NP      . ibuprofen (ADVIL) tablet 400 mg  400 mg Oral Q6H PRN Lindon Romp A, NP   400 mg at 03/28/19 2255  . insulin aspart (novoLOG) injection 0-20 Units  0-20 Units Subcutaneous TID WC Sharma Covert, MD   4 Units at 03/30/19 1221  . insulin aspart (novoLOG) injection 0-5 Units  0-5 Units Subcutaneous QHS Sharma Covert, MD      . insulin aspart (novoLOG) injection 9 Units  9 Units Subcutaneous TID AC Patrecia Pour, NP   9 Units at 03/30/19 1220  . insulin detemir (LEVEMIR) injection 50 Units  50 Units Subcutaneous QHS Patrecia Pour, NP   50 Units at 03/29/19 2140  . loperamide (IMODIUM) capsule 2 mg  2 mg Oral PRN Lindon Romp A, NP   2 mg at 03/29/19 2154  . losartan (COZAAR) tablet 100 mg  100 mg Oral Daily Patrecia Pour, NP   100 mg at 03/29/19 0804  . magnesium hydroxide (MILK OF MAGNESIA) suspension 30 mL  30 mL Oral Daily PRN Patrecia Pour, NP      . metFORMIN (GLUCOPHAGE) tablet 1,000 mg  1,000 mg Oral BID WC Patrecia Pour, NP   1,000 mg at 03/30/19 1015  . ondansetron (ZOFRAN) tablet 4 mg  4 mg Oral Q8H PRN Patrecia Pour, NP   4 mg at 03/30/19 1155  . polyvinyl alcohol (LIQUIFILM TEARS) 1.4 % ophthalmic solution 2 drop  2 drop Both Eyes PRN Sharma Covert, MD      . pravastatin (PRAVACHOL) tablet 40 mg  40 mg Oral q1800 Sharma Covert, MD   40 mg at 03/29/19 1722  . thiamine (VITAMIN B-1) tablet 100 mg  100 mg Oral Daily Sharma Covert, MD   100 mg at 03/30/19 1015    Lab Results:  Results for orders placed or performed  during the hospital encounter of 03/28/19 (from the past 48 hour(s))  Glucose, capillary     Status: Abnormal   Collection Time: 03/28/19  8:23 PM  Result Value Ref Range   Glucose-Capillary 213 (H) 70 - 99 mg/dL   Comment 1 Notify RN   Glucose, capillary  Status: Abnormal   Collection Time: 03/29/19  6:16 AM  Result Value Ref Range   Glucose-Capillary 202 (H) 70 - 99 mg/dL   Comment 1 Notify RN   Glucose, capillary     Status: Abnormal   Collection Time: 03/29/19 12:00 PM  Result Value Ref Range   Glucose-Capillary 221 (H) 70 - 99 mg/dL  Glucose, capillary     Status: Abnormal   Collection Time: 03/29/19  4:51 PM  Result Value Ref Range   Glucose-Capillary 210 (H) 70 - 99 mg/dL  Glucose, capillary     Status: Abnormal   Collection Time: 03/29/19  8:31 PM  Result Value Ref Range   Glucose-Capillary 131 (H) 70 - 99 mg/dL  Glucose, capillary     Status: Abnormal   Collection Time: 03/30/19  6:12 AM  Result Value Ref Range   Glucose-Capillary 140 (H) 70 - 99 mg/dL  Hemoglobin A1c     Status: Abnormal   Collection Time: 03/30/19  6:44 AM  Result Value Ref Range   Hgb A1c MFr Bld 11.3 (H) 4.8 - 5.6 %    Comment: (NOTE) Pre diabetes:          5.7%-6.4% Diabetes:              >6.4% Glycemic control for   <7.0% adults with diabetes    Mean Plasma Glucose 277.61 mg/dL    Comment: Performed at [facility], [place] [address], [place], [place] [address]  Glucose, capillary     Status: Abnormal   Collection Time: 03/30/19 11:54 AM  Result Value Ref Range   Glucose-Capillary 189 (H) 70 - 99 mg/dL    Blood Alcohol level:  Lab Results  Component Value Date   ETH <10 03/28/2019   ETH <10 72/08/4708    Metabolic Disorder Labs: Lab Results  Component Value Date   HGBA1C 11.3 (H) 03/30/2019   MPG 277.61 03/30/2019   MPG 200.12 12/07/2018   No results found for: PROLACTIN Lab Results  Component Value Date   CHOL 167 12/07/2018   TRIG 168 (H) 12/07/2018   HDL 50  12/07/2018   CHOLHDL 3.3 12/07/2018   VLDL 34 12/07/2018   LDLCALC 83 12/07/2018   LDLCALC 88 11/05/2017    Physical Findings: AIMS: Facial and Oral Movements Muscles of Facial Expression: None, normal Lips and Perioral Area: None, normal Jaw: None, normal Tongue: None, normal,Extremity Movements Upper (arms, wrists, hands, fingers): None, normal Lower (legs, knees, ankles, toes): None, normal, Trunk Movements Neck, shoulders, hips: None, normal, Overall Severity Severity of abnormal movements (highest score from questions above): None, normal Incapacitation due to abnormal movements: None, normal Patient's awareness of abnormal movements (rate only patient's report): No Awareness, Dental Status Current problems with teeth and/or dentures?: No Does patient usually wear dentures?: No  CIWA:    COWS:     Musculoskeletal: Strength & Muscle Tone: within normal limits Gait & Station: normal Patient leans: N/A  Psychiatric Specialty Exam: Physical Exam  ROSno shortness of breath, no coughing , no fever, no chills   Blood pressure 100/83, pulse (!) 102, temperature 98.4 F (36.9 C), temperature source Oral, resp. rate 16, height _0  (1.702 m), weight (!) 163.3 kg, last menstrual period 03/28/2019, SpO2 100 %.Body mass index is 56.38 kg/m.  General Appearance: Fairly Groomed  Eye Contact:  Fair  Speech:  Normal Rate  Volume:  Normal  Mood:  improving mood, states she is feeling better today  Affect:  vaguely constricted  Thought Process:  Linear and Descriptions of Associations: Intact  Orientation:  Other:  fully alert and attentive  Thought Content:  no hallucinations, no delusions , not internally preoccupied   Suicidal Thoughts:  No denies suicidal or self injurious ideations , contracts for safety at this time, denies homicidal or violent ideations  Homicidal Thoughts:  No  Memory:  recent and remote grossly intact  Judgement:  Fair/ improving   Insight:  Fair/  improving   Psychomotor Activity:  Normal  Concentration:  Concentration: Good and Attention Span: Good  Recall:  Good  Fund of Knowledge:  Good  Language:  Good  Akathisia:  Negative  Handed:  Right  AIMS (if indicated):     Assets:  Desire for Improvement Resilience  ADL's:  Intact  Cognition:  WNL  Sleep:  Number of Hours: 6.5   Assessment -  38 year old , history of depression, DM, history of prior psychiatric admission in January 2020 . Presented for worsening depression, passive SI, anxiety, feeling overwhelmed in the context of argument with her spouse and being off her medications for about a month. States " because of this coronavirus, transportation has been less reliable and I have been scared of going out much" leading to med non compliance  Patient reports she is feeling better today , which she attributes to being back on her medications and to a positive , more supportive phone conversation with her husband. Currently denies medication side effects, except for some loose stools related to Losartan. She also requests to lower Neurontin to 300 mgrs BID, which was the dose she had been taking. CBG today 189.  Treatment Plan Summary: Daily contact with patient to assess and evaluate symptoms and progress in treatment, Medication management, Plan inpatient admission and medications as below Encourage group and milieu participation to work on coping skills and symptom reduction Continue Lexapro 10 mgrs QDAY for depression, anxiety Decrease  Neurontin to  300 mgrs BID for anxiety, neuropathy Continue Doxepin 25 mgrs QHS for insomnia Continue Losartan for HTN, Metformin and Insulin ( Levemir and Novolog for meal coverage + sliding scale ) for DM management Treatment team working on disposition [place], MD 03/30/2019, 1:29 PM

## 2019-03-30 NOTE — Progress Notes (Signed)
Patient ID: Valor Denean Reddix, female   DOB: 06/22/1981, 38 y.o.   MRN: [id number] [place] NOVEL CORONAVIRUS (COVID-19) DAILY CHECK-OFF SYMPTOMS - answer yes or no to each - every day NO YES  Have you had a fever in the past 24 hours?  . Fever (Temp > 37.80C / 100F) X   Have you had any of these symptoms in the past 24 hours? . New Cough .  Sore Throat  .  Shortness of Breath .  Difficulty Breathing .  Unexplained Body Aches   X   Have you had any one of these symptoms in the past 24 hours not related to allergies?   . Runny Nose .  Nasal Congestion .  Sneezing   X   If you have had runny nose, nasal congestion, sneezing in the past 24 hours, has it worsened?  X   EXPOSURES - check yes or no X   Have you traveled outside the state in the past 14 days?  X   Have you been in contact with someone with a confirmed diagnosis of COVID-19 or PUI in the past 14 days without wearing appropriate PPE?  X   Have you been living in the same home as a person with confirmed diagnosis of COVID-19 or a PUI (household contact)?    X   Have you been diagnosed with COVID-19?    X              What to do next: Answered NO to all: Answered YES to anything:   Proceed with unit schedule Follow the [facility] Inpatient Flowsheet.   

## 2019-03-30 NOTE — Progress Notes (Signed)
[place] NOVEL CORONAVIRUS (COVID-19) DAILY CHECK-OFF SYMPTOMS - answer yes or no to each - every day NO YES  Have you had a fever in the past 24 hours?  . Fever (Temp > 37.80C / 100F) X   Have you had any of these symptoms in the past 24 hours? . New Cough .  Sore Throat  .  Shortness of Breath .  Difficulty Breathing .  Unexplained Body Aches   X   Have you had any one of these symptoms in the past 24 hours not related to allergies?   . Runny Nose .  Nasal Congestion .  Sneezing   X   If you have had runny nose, nasal congestion, sneezing in the past 24 hours, has it worsened?  X   EXPOSURES - check yes or no X   Have you traveled outside the state in the past 14 days?  X   Have you been in contact with someone with a confirmed diagnosis of COVID-19 or PUI in the past 14 days without wearing appropriate PPE?  X   Have you been living in the same home as a person with confirmed diagnosis of COVID-19 or a PUI (household contact)?    X   Have you been diagnosed with COVID-19?    X              What to do next: Answered NO to all: Answered YES to anything:   Proceed with unit schedule Follow the [facility] Inpatient Flowsheet.   

## 2019-03-30 NOTE — Progress Notes (Signed)
Psychoeducational Group Note  Date:  03/30/2019 Time:  2240  Group Topic/Focus:  Wrap-Up Group:   The focus of this group is to help patients review their daily goal of treatment and discuss progress on daily workbooks.  Participation Level: Did Not Attend  Participation Quality:  Not Applicable  Affect:  Not Applicable  Cognitive:  Not Applicable  Insight:  Not Applicable  Engagement in Group: Not Applicable  Additional Comments:  The patient did not attend group this evening since she was asleep in her bedroom.   Hazle Coca S 03/30/2019, 10:40 PM

## 2019-03-30 NOTE — Progress Notes (Signed)
Spiritual care group on grief and loss facilitated by chaplain Burnis Kingfisher   Group opened with brief discussion and psycho-social ed around grief and loss in relationships and in relation to self - identifying life patterns, circumstances, changes that cause losses. Established group norm of speaking from own life experience. Group goal of establishing open and affirming space for members to share loss and experience with grief, normalize grief experience and provide psycho social education and grief support.     Pt was invited,  Did not attend.

## 2019-03-30 NOTE — Progress Notes (Signed)
[place] did not attend wrap-up group.She denies SI/HI/AVH/Pain at present. Pt appears depressed/flat/isolative in affect and mood.Pt was minimal with interaction. Pt states she feels tired and sleepy all day. Pt refused scheduled doxepin. CBG at bedtime was 133. Support offered.Will continue with POC.

## 2019-03-31 LAB — GLUCOSE, CAPILLARY
Glucose-Capillary: 126 mg/dL — ABNORMAL HIGH (ref 70–99)
Glucose-Capillary: 139 mg/dL — ABNORMAL HIGH (ref 70–99)

## 2019-03-31 MED ORDER — LOSARTAN POTASSIUM 100 MG PO TABS: 100.0000 mg | ORAL_TABLET | Freq: Every day | ORAL | 0 refills | Status: DC

## 2019-03-31 MED ORDER — PRAVASTATIN SODIUM 40 MG PO TABS: 40.0000 mg | ORAL_TABLET | Freq: Every day | ORAL | 0 refills | Status: DC

## 2019-03-31 MED ORDER — GABAPENTIN 300 MG PO CAPS: 300.0000 mg | ORAL_CAPSULE | Freq: Two times a day (BID) | ORAL | 0 refills | Status: DC

## 2019-03-31 MED ORDER — ESCITALOPRAM OXALATE 10 MG PO TABS: 10.0000 mg | ORAL_TABLET | Freq: Every day | ORAL | 0 refills | Status: DC

## 2019-03-31 MED ORDER — DOXEPIN HCL 25 MG PO CAPS: 25.0000 mg | ORAL_CAPSULE | Freq: Every day | ORAL | 0 refills | Status: DC

## 2019-03-31 NOTE — Progress Notes (Signed)
  [facility] Adult Case Management Discharge Plan :  Will you be returning to the same living situation after discharge:  Yes,  patient reports she is returning home with her spouse At discharge, do you have transportation home?: Yes,  patient reports that her husband is picking her up at discharge Do you have the ability to pay for your medications: Yes,  Medicaid, SSDI  Release of information consent forms completed and in the chart;  Patient's signature needed at discharge.  Patient to Follow up at: Follow-up Information    [facility] Follow up on 04/06/2019.   Why:  Hospital follow up appointment is Tuesday, 5/5 at 10:00a.  The appointment will be held over the phone and the therapist will contact you.  Contact information: [address] [place] [address] [phone]        [facility] Follow up on 04/08/2019.   Why:  Follow up appointment for diabetes with Dr. Orrin Brigham is Thursday, 5/7 at 12:15p  Contact information: [address] [place] [address] Ph: [phone] Fx: [phone]          Next level of care provider has access to [facility] Link:yes  Safety Planning and Suicide Prevention discussed: Yes,  with the patient  Have you used any form of tobacco in the last 30 days? (Cigarettes, Smokeless Tobacco, Cigars, and/or Pipes): No  Has patient been referred to the Quitline?: N/A patient is not a smoker  Patient has been referred for addiction treatment: N/A  Rachel George, LCSWA 03/31/2019, 10:32 AM

## 2019-03-31 NOTE — [facility] Suicide Risk Assessment (Signed)
[facility] Discharge Suicide Risk Assessment   Principal Problem: Major depressive disorder, recurrent severe without psychotic features (HCC) Discharge Diagnoses: Principal Problem:   Major depressive disorder, recurrent severe without psychotic features (HCC)   Total Time spent with patient: 30 minutes  Musculoskeletal: Strength & Muscle Tone: within normal limits Gait & Station: normal Patient leans: N/A  Psychiatric Specialty Exam: ROS denies chest pain, no cough, no shortness of breath, no vomiting, no fever.  Does endorse loose stools, now improved, which she attributes to losartan  Blood pressure 139/85, pulse (!) 116, temperature 98.7 F (37.1 C), temperature source Oral, resp. rate 16, height 5\' 7"  (1.702 m), weight (!) 163.3 kg, last menstrual period 03/28/2019, SpO2 100 %.Body mass index is 56.38 kg/m.  General Appearance: Improving grooming  Eye Contact::  Good  Speech:  Normal Rate409  Volume:  Normal  Mood:  Improving good mood, states "I feel a lot better"  Affect:  Appropriate and More reactive  Thought Process:  Linear and Descriptions of Associations: Intact  Orientation:  Other:  Fully alert and attentive  Thought Content:  No hallucinations, no delusions, not internally preoccupied  Suicidal Thoughts:  No denies suicidal or self injurious ideations, denies homicidal or violent ideations  Homicidal Thoughts:  No  Memory:  recent and remote grossly intact   Judgement:  Other:  Improving  Insight:  Improving  Psychomotor Activity:  Normal  Concentration:  Good  Recall:  Good  Fund of Knowledge:Good  Language: Good  Akathisia:  Negative  Handed:  Right  AIMS (if indicated):     Assets:  Desire for Improvement Resilience  Sleep:  Number of Hours: 6.5  Cognition: WNL  ADL's:  Intact   Mental Status Per Nursing Assessment::   On Admission:  NA  Demographic Factors:  38 year old female  Loss Factors: Marital discord/tension  Historical Factors: History  of depression, history of prior psychiatric admissions  Risk Reduction Factors:   Positive coping skills or problem solving skills  Continued Clinical Symptoms:  Currently patient presents alert, attentive, pleasant, calm, describes mood as improved, affect is appropriate, denies suicidal ideations, no homicidal ideations, no hallucinations, no delusions, future oriented. Behavior on generally good control.  Currently tolerating medications well.  She does report she is had some loose stools associated with losartan and that she plans to discuss antihypertensive regimen with her  PCP, Dr. Robynn Pane.  We discussed option of changing antihypertensive medication prior to her discharge but she prefers to make changes with her PCP.    Cognitive Features That Contribute To Risk:  No gross cognitive deficits noted upon discharge. Is alert , attentive, and oriented x 3   Suicide Risk:  Mild:  Suicidal ideation of limited frequency, intensity, duration, and specificity.  There are no identifiable plans, no associated intent, mild dysphoria and related symptoms, good self-control (both objective and subjective assessment), few other risk factors, and identifiable protective factors, including available and accessible social support.  Follow-up Information    [facility] Follow up on 04/06/2019.   Why:  Hospital follow up appointment is Tuesday, 5/5 at 10:00a.  The appointment will be held over the phone and the therapist will contact you.  Contact information: [address] [place] [address] [phone]        [facility] Follow up on 04/08/2019.   Why:  Follow up appointment for diabetes with Dr. Orrin Brigham is Thursday, 5/7 at 12:15p  Contact information: [address] [place] [address] Ph: [phone]  Fx: [phone]          Plan Of Care/Follow-up recommendations:  Activity:  As tolerated Diet:  Heart healthy/diabetic diet Tests:  NA Other:  See  below  Patient is expressing readiness for discharge and is leaving unit in good spirits. Plans to return home Follow-up as above, as noted we will follow-up with PCP as above as well.    Craige CottaFernando A Cobos, MD 03/31/2019, 11:38 AM

## 2019-03-31 NOTE — Progress Notes (Signed)
Patient ID: Rachel George, female   DOB: Sep 06, 1981, 38 y.o.   MRN: [id number] [place] NOVEL CORONAVIRUS (COVID-19) DAILY CHECK-OFF SYMPTOMS - answer yes or no to each - every day NO YES  Have you had a fever in the past 24 hours?  . Fever (Temp > 37.80C / 100F) X   Have you had any of these symptoms in the past 24 hours? . New Cough .  Sore Throat  .  Shortness of Breath .  Difficulty Breathing .  Unexplained Body Aches   X   Have you had any one of these symptoms in the past 24 hours not related to allergies?   . Runny Nose .  Nasal Congestion .  Sneezing   X   If you have had runny nose, nasal congestion, sneezing in the past 24 hours, has it worsened?  X   EXPOSURES - check yes or no X   Have you traveled outside the state in the past 14 days?  X   Have you been in contact with someone with a confirmed diagnosis of COVID-19 or PUI in the past 14 days without wearing appropriate PPE?  X   Have you been living in the same home as a person with confirmed diagnosis of COVID-19 or a PUI (household contact)?    X   Have you been diagnosed with COVID-19?    X              What to do next: Answered NO to all: Answered YES to anything:   Proceed with unit schedule Follow the [facility] Inpatient Flowsheet.

## 2019-03-31 NOTE — Discharge Summary (Addendum)
Physician Discharge Summary Note  Patient:  Rachel George is an 38 y.o., female MRN:  [id number] DOB:  08/31/1981 Patient phone:  [phone] (home)  Patient address:   [address] [place] [id number],  Total Time spent with patient: 15 minutes  Date of Admission:  03/28/2019 Date of Discharge: 03/31/19  Reason for Admission:  Suicidal ideation  Principal Problem: Major depressive disorder, recurrent severe without psychotic features [facility](HCC) Discharge Diagnoses: Principal Problem:   Major depressive disorder, recurrent severe without psychotic features [facility](HCC)   Past Psychiatric History: Per admission H&P: Patient was last psychiatrically hospitalized at our facility in January of this year. She has been treated with Lexapro in the past for her depression.She has been followed by outpatient local mental health center, but has not been seen since April 2018.She has previous admissions prior to that. She did have a history of cutting as an adolescent.  Past Medical History:  Past Medical History:  Diagnosis Date  . Cataract   . Depression   . Diabetes mellitus without complication (HCC)    dx age 38  . Fatty liver disease, nonalcoholic   . Macular degeneration   . Neuropathy   . Partial blindness     Past Surgical History:  Procedure Laterality Date  . AMPUTATION TOE Right 12/16/2016   Procedure: RIGHT FIRST TOE AMPUTATION;  Surgeon: Ancil LinseyJason Evan Davis, MD;  Location: [facility];  Service: General;  Laterality: Right;  Right first toe amputation for osteomyelitis  . eye lid transplant    . RETINAL LASER PROCEDURE     Family History:  Family History  Problem Relation Age of Onset  . Diabetes Father    Family Psychiatric  History: Per admission H&P: Patient stated that her mother has an unspecified psychiatric diagnosis, and her father has depression. Social History:  Social History   Substance and Sexual Activity  Alcohol Use No     Social History    Substance and Sexual Activity  Drug Use No    Social History   Socioeconomic History  . Marital status: Married    Spouse name: Christena DeemShane Lemere  . Number of children: Not on file  . Years of education: Not on file  . Highest education level: Not on file  Occupational History  . Occupation: CNA personal care giver  Social Needs  . Financial resource strain: Patient refused  . Food insecurity:    Worry: Patient refused    Inability: Patient refused  . Transportation needs:    Medical: Not on file    Non-medical: Not on file  Tobacco Use  . Smoking status: Former Smoker    Packs/day: 0.25    Types: Cigarettes    Last attempt to quit: 12/11/2016    Years since quitting: 2.3  . Smokeless tobacco: Never Used  Substance and Sexual Activity  . Alcohol use: No  . Drug use: No  . Sexual activity: Yes    Partners: Male    Birth control/protection: None  Lifestyle  . Physical activity:    Days per week: Patient refused    Minutes per session: Patient refused  . Stress: Patient refused  Relationships  . Social connections:    Talks on phone: Patient refused    Gets together: Patient refused    Attends religious service: Patient refused    Active member of club or organization: Not on file    Attends meetings of clubs or organizations: Not on file    Relationship status: Patient  refused  Other Topics Concern  . Not on file  Social History Narrative  . Not on file    Hospital Course:  From admission H&P: Patient is a 38 year old female with a past psychiatric history significant for major depression who presented to the [facility] emergency department on 03/28/2019 with suicidal ideation. The patient called 911 after getting into an argument with her husband. He apparently has schizophrenia, has not been compliant with his medications, and was consuming alcohol. The patient became irritated and an argument she was having with her husband. She broke her glasses and a bowl  in response to her husband's behavior. She stated this made her suicidal. She stated she "I had rather not be here". She stated that after her last psychiatric hospitalization in January 2028 she did not follow-up with her psychiatry follow-up as well as her medical follow-up. She was noncompliant with her medications. She admitted to helplessness, hopelessness, worthlessness, anger, irritability, difficulty sleeping. She was transferred to our facility for evaluation and stabilization.  Ms. Dahlem was admitted for suicidal ideation, in the context of marital stress. She was restarted on Lexapro, doxepin, and gabapentin. Medications for diabetes and HTN were restarted. Pravachol was started for HLD. She attended some of the group therapy sessions on the unit. She displayed no agitated or disruptive behaviors on the unit. She responded well to treatment with no adverse effects reported. She remained on the [facility] unit for 2 days. She stabilized with medication and therapy. She was discharged on the medications listed below. She has shown improvement with improved mood, affect, sleep, appetite, and interaction. She denies any SI/HI/AVH and contracts for safety. She agrees to follow up at [facility] for psychiatric care, as well as with her PCP at [facility] for diabetes and HTN (see below). She is provided with prescriptions and medication samples upon discharge. Her husband is picking her up for discharge home.   Physical Findings: AIMS: Facial and Oral Movements Muscles of Facial Expression: None, normal Lips and Perioral Area: None, normal Jaw: None, normal Tongue: None, normal,Extremity Movements Upper (arms, wrists, hands, fingers): None, normal Lower (legs, knees, ankles, toes): None, normal, Trunk Movements Neck, shoulders, hips: None, normal, Overall Severity Severity of abnormal movements (highest score from questions above): None, normal Incapacitation due to abnormal  movements: None, normal Patient's awareness of abnormal movements (rate only patient's report): No Awareness, Dental Status Current problems with teeth and/or dentures?: No Does patient usually wear dentures?: No  CIWA:    COWS:     Musculoskeletal: Strength & Muscle Tone: within normal limits Gait & Station: normal Patient leans: N/A  Psychiatric Specialty Exam: Physical Exam  Nursing note and vitals reviewed. Constitutional: She is oriented to person, place, and time. She appears well-developed and well-nourished.  Cardiovascular: Normal rate.  Respiratory: Effort normal.  Neurological: She is alert and oriented to person, place, and time.  Psychiatric: Her behavior is normal.    Review of Systems  Constitutional: Negative.   Respiratory: Negative for cough and shortness of breath.   Cardiovascular: Negative for chest pain.  Gastrointestinal: Negative for nausea and vomiting.  Psychiatric/Behavioral: Positive for depression (improving). Negative for hallucinations, substance abuse and suicidal ideas. The patient is not nervous/anxious and does not have insomnia.     Blood pressure 139/85, pulse (!) 116, temperature 98.7 F (37.1 C), temperature source Oral, resp. rate 16, height  (1.702 m), weight (!) 163.3 kg, last menstrual period 03/28/2019, SpO2 100 %.Body mass index is 56.38 kg/m.  See MD's discharge SRA     Have you used any form of tobacco in the last 30 days? (Cigarettes, Smokeless Tobacco, Cigars, and/or Pipes): No  Has this patient used any form of tobacco in the last 30 days? (Cigarettes, Smokeless Tobacco, Cigars, and/or Pipes)  No  Blood Alcohol level:  Lab Results  Component Value Date   ETH <10 03/28/2019   ETH <10 12/06/2018    Metabolic Disorder Labs:  Lab Results  Component Value Date   HGBA1C 11.3 (H) 03/30/2019   MPG 277.61 03/30/2019   MPG 200.12 12/07/2018   No results found for: PROLACTIN Lab Results  Component Value Date   CHOL 167  12/07/2018   TRIG 168 (H) 12/07/2018   HDL 50 12/07/2018   CHOLHDL 3.3 12/07/2018   VLDL 34 12/07/2018   LDLCALC 83 12/07/2018   LDLCALC 88 11/05/2017    See Psychiatric Specialty Exam and Suicide Risk Assessment completed by Attending Physician prior to discharge.  Discharge destination:  Home  Is patient on multiple antipsychotic therapies at discharge:  No   Has Patient had three or more failed trials of antipsychotic monotherapy by history:  No  Recommended Plan for Multiple Antipsychotic Therapies: NA  Discharge Instructions    Discharge instructions   Complete by:  As directed    Patient is instructed to take all prescribed medications as recommended. Report any side effects or adverse reactions to your outpatient psychiatrist. Patient is instructed to abstain from alcohol and illegal drugs while on prescription medications. In the event of worsening symptoms, patient is instructed to call the crisis hotline, 911, or go to the nearest emergency department for evaluation and treatment.     Allergies as of 03/31/2019      Reactions   Shellfish Allergy Anaphylaxis, Shortness Of Breath, Swelling   Facial Swelling   Percocet [oxycodone-acetaminophen] Itching, Nausea And Vomiting      Medication List    STOP taking these medications   hydroxypropyl methylcellulose / hypromellose 2.5 % ophthalmic solution Commonly known as:  ISOPTO TEARS / GONIOVISC     TAKE these medications     Indication  doxepin 25 MG capsule Commonly known as:  SINEQUAN Take 1 capsule (25 mg total) by mouth at bedtime. For sleep  Indication:  Insomnia   escitalopram 10 MG tablet Commonly known as:  LEXAPRO Take 1 tablet (10 mg total) by mouth daily. For mood What changed:  additional instructions  Indication:  Major Depressive Disorder   gabapentin 300 MG capsule Commonly known as:  NEURONTIN Take 1 capsule (300 mg total) by mouth 2 (two) times daily. For pain What changed:    when to  take this  additional instructions  Indication:  Neuropathic Pain   insulin aspart 100 UNIT/ML injection Commonly known as:  novoLOG Inject 9 Units into the skin 3 (three) times daily before meals.  Indication:  Type 2 Diabetes   insulin detemir 100 UNIT/ML injection Commonly known as:  LEVEMIR Inject 0.5 mLs (50 Units total) into the skin at bedtime. Reported on 04/16/2016  Indication:  Type 2 Diabetes   losartan 100 MG tablet Commonly known as:  COZAAR Take 1 tablet (100 mg total) by mouth daily. For high blood pressure What changed:  additional instructions  Indication:  High Blood Pressure Disorder   metFORMIN 1000 MG tablet Commonly known as:  GLUCOPHAGE Take 1,000 mg by mouth 2 (two) times daily with a meal.  Indication:  Type 2 Diabetes   pravastatin 40 MG  tablet Commonly known as:  PRAVACHOL Take 1 tablet (40 mg total) by mouth daily at 6 PM. For high cholesterol  Indication:  High Amount of Fats in the Blood      Follow-up Information    Services, [facility] Follow up on 04/06/2019.   Why:  Hospital follow up appointment is Tuesday, 5/5 at 10:00a.  The appointment will be held over the phone and the therapist will contact you.  Contact information: [address] [place] [address] [phone]        [facility] Follow up on 04/08/2019.   Why:  Follow up appointment for diabetes with Dr. Orrin Brigham is Thursday, 5/7 at 12:15p  Contact information: [address] [place] [address] Ph: [phone] Fx: [phone]          Follow-up recommendations: Activity as tolerated. Diet as recommended by primary care physician. Keep all scheduled follow-up appointments as recommended.   Comments:   Patient is instructed to take all prescribed medications as recommended. Report any side effects or adverse reactions to your outpatient psychiatrist. Patient is instructed to abstain from alcohol and illegal drugs while on prescription  medications. In the event of worsening symptoms, patient is instructed to call the crisis hotline, 911, or go to the nearest emergency department for evaluation and treatment.  Signed: Aldean Baker, NP 03/31/2019, 10:41 AM   Patient seen, Suicide Assessment Completed.  Disposition Plan Reviewed

## 2019-03-31 NOTE — Progress Notes (Signed)
Patient ID: Rachel George, female   DOB: 1980-12-07, 38 y.o.   MRN: [id number] Pt d/c to home with d/c instructions, medications reviewed and samples provided. Pt verbalizes understanding. Pt denies s.i.

## 2019-07-22 ENCOUNTER — Encounter (HOSPITAL_COMMUNITY): Payer: Self-pay | Admitting: Emergency Medicine

## 2019-07-22 ENCOUNTER — Emergency Department (HOSPITAL_COMMUNITY): Payer: Medicaid Other

## 2019-07-22 ENCOUNTER — Other Ambulatory Visit: Payer: Self-pay

## 2019-07-22 ENCOUNTER — Emergency Department (HOSPITAL_COMMUNITY)
Admission: EM | Admit: 2019-07-22 | Discharge: 2019-07-22 | Disposition: A | Discharge status: Home / Self Care | Payer: Medicaid Other | Attending: Emergency Medicine | Admitting: Emergency Medicine

## 2019-07-22 DIAGNOSIS — Z5321 Procedure and treatment not carried out due to patient leaving prior to being seen by health care provider: Secondary | ICD-10-CM | POA: Insufficient documentation

## 2019-07-22 DIAGNOSIS — R0789 Other chest pain: Secondary | ICD-10-CM | POA: Diagnosis present

## 2019-07-22 LAB — CBC
HCT: 42.2 % (ref 36.0–46.0)
Hemoglobin: 12.9 g/dL (ref 12.0–15.0)
MCH: 25.1 pg — ABNORMAL LOW (ref 26.0–34.0)
MCHC: 30.6 g/dL (ref 30.0–36.0)
MCV: 82.3 fL (ref 80.0–100.0)
Platelets: 249 10*3/uL (ref 150–400)
RBC: 5.13 MIL/uL — ABNORMAL HIGH (ref 3.87–5.11)
RDW: 14.3 % (ref 11.5–15.5)
WBC: 7.3 10*3/uL (ref 4.0–10.5)
nRBC: 0 % (ref 0.0–0.2)

## 2019-07-22 LAB — BASIC METABOLIC PANEL
Anion gap: 12 (ref 5–15)
BUN: 17 mg/dL (ref 6–20)
CO2: 21 mmol/L — ABNORMAL LOW (ref 22–32)
Calcium: 8.7 mg/dL — ABNORMAL LOW (ref 8.9–10.3)
Chloride: 102 mmol/L (ref 98–111)
Creatinine, Ser: 1.08 mg/dL — ABNORMAL HIGH (ref 0.44–1.00)
GFR calc Af Amer: >60 mL/min (ref >60)
GFR calc non Af Amer: >60 mL/min (ref >60)
Glucose, Bld: 296 mg/dL — ABNORMAL HIGH (ref 70–99)
Potassium: 4 mmol/L (ref 3.5–5.1)
Sodium: 135 mmol/L (ref 135–145)

## 2019-07-22 LAB — GLUCOSE, CAPILLARY: Glucose-Capillary: 283 mg/dL — ABNORMAL HIGH (ref 70–99)

## 2019-07-22 LAB — TROPONIN I (HIGH SENSITIVITY): Troponin I (High Sensitivity): 6 ng/L (ref <18)

## 2019-07-22 MED ORDER — SODIUM CHLORIDE 0.9% FLUSH: 3.0000 mL | Freq: Once | INTRAVENOUS | Status: DC

## 2019-07-22 NOTE — ED Triage Notes (Signed)
Patient reports central chest pain that started today after lunch. Patient reports feeling anxious. Patient states she is nauseated.

## 2019-08-25 ENCOUNTER — Ambulatory Visit: Payer: Medicaid Other | Admitting: Family Medicine

## 2019-09-14 ENCOUNTER — Ambulatory Visit: Payer: Medicaid Other | Admitting: Family Medicine

## 2019-12-15 ENCOUNTER — Ambulatory Visit: Payer: Medicaid Other | Admitting: Family Medicine

## 2019-12-22 ENCOUNTER — Ambulatory Visit: Payer: Medicaid Other | Admitting: Family Medicine

## 2020-01-26 ENCOUNTER — Encounter: Payer: Self-pay | Admitting: Family Medicine

## 2020-01-26 ENCOUNTER — Other Ambulatory Visit: Payer: Self-pay

## 2020-01-26 ENCOUNTER — Ambulatory Visit (INDEPENDENT_AMBULATORY_CARE_PROVIDER_SITE_OTHER): Payer: Medicaid Other | Admitting: Family Medicine

## 2020-01-26 DIAGNOSIS — Z124 Encounter for screening for malignant neoplasm of cervix: Secondary | ICD-10-CM

## 2020-01-26 DIAGNOSIS — F332 Major depressive disorder, recurrent severe without psychotic features: Secondary | ICD-10-CM | POA: Diagnosis not present

## 2020-01-26 DIAGNOSIS — E559 Vitamin D deficiency, unspecified: Secondary | ICD-10-CM

## 2020-01-26 DIAGNOSIS — E785 Hyperlipidemia, unspecified: Secondary | ICD-10-CM

## 2020-01-26 DIAGNOSIS — E1159 Type 2 diabetes mellitus with other circulatory complications: Secondary | ICD-10-CM

## 2020-01-26 DIAGNOSIS — K219 Gastro-esophageal reflux disease without esophagitis: Secondary | ICD-10-CM

## 2020-01-26 DIAGNOSIS — E113523 Type 2 diabetes mellitus with proliferative diabetic retinopathy with traction retinal detachment involving the macula, bilateral: Secondary | ICD-10-CM

## 2020-01-26 DIAGNOSIS — IMO0002 Reserved for concepts with insufficient information to code with codable children: Secondary | ICD-10-CM

## 2020-01-26 DIAGNOSIS — E118 Type 2 diabetes mellitus with unspecified complications: Secondary | ICD-10-CM | POA: Diagnosis not present

## 2020-01-26 DIAGNOSIS — E1165 Type 2 diabetes mellitus with hyperglycemia: Secondary | ICD-10-CM

## 2020-01-26 DIAGNOSIS — E1142 Type 2 diabetes mellitus with diabetic polyneuropathy: Secondary | ICD-10-CM

## 2020-01-26 DIAGNOSIS — I10 Essential (primary) hypertension: Secondary | ICD-10-CM

## 2020-01-26 DIAGNOSIS — Z794 Long term (current) use of insulin: Secondary | ICD-10-CM

## 2020-01-26 MED ORDER — BLOOD GLUCOSE METER KIT: PACK | 0 refills | Status: DC

## 2020-01-26 MED ORDER — FAMOTIDINE 20 MG PO TABS: 20.0000 mg | ORAL_TABLET | Freq: Two times a day (BID) | ORAL | 1 refills | Status: DC

## 2020-01-26 NOTE — Patient Instructions (Addendum)
I appreciate the opportunity to provide you with care for your health and wellness. Today we discussed: establish care  Follow up: 5 weeks   Labs today Referrals today: Diabetic Nutritionist, Diabetic Dr. And GYN   Start the Pepcid.   Please continue to practice social distancing to keep you, your family, and our community safe.  If you must go out, please wear a mask and practice good handwashing.  It was a pleasure to see you and I look forward to continuing to work together on your health and well-being. Please do not hesitate to call the office if you need care or have questions about your care.  Have a wonderful day and week. With Gratitude, Tereasa Coop, DNP, AGNP-BC

## 2020-01-26 NOTE — Progress Notes (Signed)
   Subjective:  Patient ID: Rachel George, female    DOB: 08/15/1981  Age: 38 y.o. MRN: [id number]  CC:  Chief Complaint  Patient presents with  . New Patient (Initial Visit)    establish care restart medications, weight      HPI  HPI   Rachel George is a 38-year-old female who presents today to establish care here at [facility].  She has a history of depression, diabetes, fatty liver disease-[place], glaucoma, hyperlipidemia, hypertension, macular degeneration, neuropathy and partial blindness.  She has had a right toe amputation in 2018 due to osteomyelitis, eyelid transplant and retinal laser surgery procedure.  History of smoking quit in 2018.  No alcohol or drug use. Lives with husband of 7 years.  No children at this time.  She enjoys walking and being active when she can.  She reports she does not eat very well she only eats 1 meal a day definitely needs assistance with helping with meal planning and what is safe for diabetic diet.  She needs updated labs.  Has not been checking her blood sugars as she needs supplies.  She reports that she has to sleep on several pillows as well.  Did have normal cycles when she was a teenager reports having cyst on ovaries.  Having trouble losing weight wonders if it something hormonal.  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  She reports that her eye is stable and is followed closely last appointment was January.  She has a concern that she has incontinence related to medication use.  She reports that she thinks is related to the statin medications but she also reports that she does not eat but 1 meal a day.  And she does take Metformin.  She reports that she has nausea and heartburn and is unable to tolerate some of her medications at this time.  She is open to seeing a nutritionist, Dr. Nida, referral to GYN,  possible need for cardiology as well.  Today patient denies signs and symptoms of COVID 19 infection including fever, chills, cough, shortness of breath, and headache. Past Medical, Surgical, Social History, Allergies, and Medications have been Reviewed.   Past Medical History:  Diagnosis Date  . Anemia   . Cataract   . Cellulitis of toe of right foot 10/07/2016  . Depression   . Diabetes mellitus without complication (HCC)    dx age 19  . Diabetic foot ulcer (HCC) 10/15/2016  . Fatty liver disease, nonalcoholic   . Glaucoma   . Hyperlipidemia   . Hypertension   . Macular degeneration   . Morbid obesity with BMI of 50.0-59.9, adult (HCC) 05/01/2017  . Neuropathy   . Obesity, Class III, BMI 40-49.9 (morbid obesity) (HCC) 05/01/2017  . 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 Osteomyelitis (HCC) 12/13/2016  . Partial blindness   . 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 Suicidal ideation 11/04/2017    Current Meds  Medication Sig  . gabapentin (NEURONTIN) 300 MG capsule Take 1 capsule (300 mg total) by mouth 2 (two) times daily. For pain  . insulin aspart (NOVOLOG) 100 UNIT/ML injection Inject 9 Units into the skin 3 (three) times daily before meals.  . insulin detemir (LEVEMIR) 100 UNIT/ML injection Inject 0.5 mLs (50 Units total) into the skin at bedtime. Reported on 04/16/2016  . losartan (COZAAR) 100   MG tablet Take 1 tablet (100 mg total) by mouth daily. For high blood pressure  . meloxicam (MOBIC) 7.5 MG tablet Take 7.5 mg by mouth daily.  . metFORMIN (GLUCOPHAGE) 1000 MG tablet Take 1,000 mg by mouth 2 (two) times daily with a meal.  . pravastatin (PRAVACHOL) 40 MG tablet Take 1 tablet (40 mg total) by mouth daily at 6 PM. For high cholesterol    ROS:  Review of Systems  Constitutional: Negative.   HENT: Negative.        Missing teeth   Eyes: Negative.        See HPI   Respiratory: Negative.   Cardiovascular: Negative.   Gastrointestinal: Positive for diarrhea,  heartburn and nausea.  Genitourinary: Negative.   Musculoskeletal: Negative.   Skin: Negative.   Neurological: Negative.   Endo/Heme/Allergies: Negative.   Psychiatric/Behavioral: Negative.   All other systems reviewed and are negative.   Objective:   Today's Vitals: BP (!) 146/94   Pulse 95   Temp (!) 96.8 F (36 C) (Temporal)   Resp 15   Ht 5' 7" (1.702 m)   Wt (!) 399 lb 1.9 oz (181 kg)   SpO2 97%   BMI 62.51 kg/m  Vitals with BMI 01/26/2020 07/22/2019 03/28/2019  Height 5' 7" - -  Weight 399 lbs 2 oz - -  BMI 62.5 - -  Systolic 146 141 157  Diastolic 94 84 95  Pulse 95 89 87  Some encounter information is confidential and restricted. Go to Review Flowsheets activity to see all data.     Physical Exam Vitals and nursing note reviewed.  Constitutional:      Appearance: Normal appearance. She is well-developed and well-groomed. She is morbidly obese.  HENT:     Head: Normocephalic and atraumatic.     Right Ear: External ear normal.     Left Ear: External ear normal.     Mouth/Throat:     Comments: Mask in place  Eyes:     General:        Right eye: No discharge.        Left eye: No discharge.     Conjunctiva/sclera: Conjunctivae normal.  Cardiovascular:     Rate and Rhythm: Normal rate and regular rhythm.     Pulses: Normal pulses.     Heart sounds: Normal heart sounds.  Pulmonary:     Effort: Pulmonary effort is normal.     Breath sounds: Normal breath sounds.  Musculoskeletal:        General: Normal range of motion.     Cervical back: Normal range of motion and neck supple.  Skin:    General: Skin is warm.  Neurological:     General: No focal deficit present.     Mental Status: She is alert and oriented to person, place, and time.  Psychiatric:        Attention and Perception: Attention normal.        Mood and Affect: Mood normal.        Speech: Speech normal.        Behavior: Behavior normal. Behavior is cooperative.        Thought Content: Thought  content normal.        Cognition and Memory: Cognition normal.        Judgment: Judgment normal.     Depression screen PHQ 2/9 01/26/2020  Decreased Interest 0  Down, Depressed, Hopeless 2  PHQ - 2 Score 2  Altered sleeping 3    Tired, decreased energy 2  Change in appetite 1  Feeling bad or failure about yourself  2  Trouble concentrating 0  Moving slowly or fidgety/restless 0  Suicidal thoughts 0  PHQ-9 Score 10     Assessment   1. Morbid obesity (HCC)   2. Major depressive disorder, recurrent severe without psychotic features (HCC)   3. Uncontrolled type 2 diabetes mellitus with complication (HCC)   4. Vitamin D deficiency   5. Hypertension associated with diabetes (HCC)   6. Encounter for screening for malignant neoplasm of cervix   7. Gastroesophageal reflux disease, unspecified whether esophagitis present   8. 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 Diabetic polyneuropathy associated with type 2 diabetes mellitus (HCC)   10. Uncontrolled type 2 diabetes mellitus with hyperglycemia, with long-term current use of insulin (HCC)   11. Hyperlipidemia, unspecified hyperlipidemia type     Tests ordered Orders Placed This Encounter  Procedures  . CBC  . COMPLETE METABOLIC PANEL WITH GFR  . Hemoglobin A1c  . Lipid panel  . VITAMIN D 25 Hydroxy (Vit-D Deficiency, Fractures)  . TSH  . Ambulatory referral to Obstetrics / Gynecology  . Ambulatory referral to Endocrinology  . Amb Referral to Nutrition and Diabetic E     Plan: Please see assessment and plan per problem list above.   Meds ordered this encounter  Medications  . famotidine (PEPCID) 20 MG tablet    Sig: Take 1 tablet (20 mg total) by mouth 2 (two) times daily.    Dispense:  30 tablet    Refill:  1    Order Specific Question:   Supervising Provider    Answer:   SIMPSON, MARGARET E  [[id number]]  . blood glucose meter kit and supplies    Sig: Dispense based on patient and insurance preference. Use to test blood glucose 4 times daily. (FOR ICD-10 E10.9, E11.9).    Dispense:  1 each    Refill:  0    Order Specific Question:   Number of strips    Answer:   400    Order Specific Question:   Number of lancets    Answer:   400    Patient to follow-up in 03/01/2020   Hannah M Mills, NP 

## 2020-01-27 LAB — COMPLETE METABOLIC PANEL WITH GFR
AG Ratio: 1 (calc) (ref 1.0–2.5)
ALT: 9 U/L (ref 6–29)
AST: 14 U/L (ref 10–30)
Albumin: 3.3 g/dL — ABNORMAL LOW (ref 3.6–5.1)
Alkaline phosphatase (APISO): 84 U/L (ref 31–125)
BUN: 17 mg/dL (ref 7–25)
CO2: 25 mmol/L (ref 20–32)
Calcium: 8.9 mg/dL (ref 8.6–10.2)
Chloride: 107 mmol/L (ref 98–110)
Creat: 1.02 mg/dL (ref 0.50–1.10)
GFR, Est African American: 80 mL/min/{1.73_m2} (ref >=60)
GFR, Est Non African American: 69 mL/min/{1.73_m2} (ref >=60)
Globulin: 3.4 g/dL (calc) (ref 1.9–3.7)
Glucose, Bld: 167 mg/dL — ABNORMAL HIGH (ref 65–139)
Potassium: 4.2 mmol/L (ref 3.5–5.3)
Sodium: 139 mmol/L (ref 135–146)
Total Bilirubin: 0.3 mg/dL (ref 0.2–1.2)
Total Protein: 6.7 g/dL (ref 6.1–8.1)

## 2020-01-27 LAB — LIPID PANEL
Cholesterol: 215 mg/dL — ABNORMAL HIGH (ref <200)
HDL: 55 mg/dL (ref >=50)
LDL Cholesterol (Calc): 134 mg/dL (calc) — ABNORMAL HIGH
Non-HDL Cholesterol (Calc): 160 mg/dL (calc) — ABNORMAL HIGH (ref <130)
Total CHOL/HDL Ratio: 3.9 (calc) (ref <5.0)
Triglycerides: 135 mg/dL (ref <150)

## 2020-01-27 LAB — CBC
HCT: 36.8 % (ref 35.0–45.0)
Hemoglobin: 11.9 g/dL (ref 11.7–15.5)
MCH: 25.3 pg — ABNORMAL LOW (ref 27.0–33.0)
MCHC: 32.3 g/dL (ref 32.0–36.0)
MCV: 78.3 fL — ABNORMAL LOW (ref 80.0–100.0)
MPV: 10.8 fL (ref 7.5–12.5)
Platelets: 298 10*3/uL (ref 140–400)
RBC: 4.7 10*6/uL (ref 3.80–5.10)
RDW: 14.2 % (ref 11.0–15.0)
WBC: 9 10*3/uL (ref 3.8–10.8)

## 2020-01-27 LAB — VITAMIN D 25 HYDROXY (VIT D DEFICIENCY, FRACTURES): Vit D, 25-Hydroxy: 8 ng/mL — ABNORMAL LOW (ref 30–100)

## 2020-01-27 LAB — HEMOGLOBIN A1C
Hgb A1c MFr Bld: 10.3 % of total Hgb — ABNORMAL HIGH (ref <5.7)
Mean Plasma Glucose: 249 (calc)
eAG (mmol/L): 13.8 (calc)

## 2020-01-27 LAB — TSH: TSH: 0.88 mIU/L

## 2020-01-30 ENCOUNTER — Encounter: Payer: Self-pay | Admitting: Family Medicine

## 2020-01-30 DIAGNOSIS — E1159 Type 2 diabetes mellitus with other circulatory complications: Secondary | ICD-10-CM | POA: Insufficient documentation

## 2020-01-30 DIAGNOSIS — K219 Gastro-esophageal reflux disease without esophagitis: Secondary | ICD-10-CM | POA: Insufficient documentation

## 2020-01-30 DIAGNOSIS — E559 Vitamin D deficiency, unspecified: Secondary | ICD-10-CM | POA: Insufficient documentation

## 2020-01-30 DIAGNOSIS — Z124 Encounter for screening for malignant neoplasm of cervix: Secondary | ICD-10-CM | POA: Insufficient documentation

## 2020-01-30 NOTE — Assessment & Plan Note (Signed)
Rachel George is encouraged to maintain a well balanced diet that is low in salt. Controlled, continue current medication regimen. Might need adjustment will be getting labs first. Additionally, she is also reminded that exercise is beneficial for heart health and control of  Blood pressure. 30-60 minutes daily is recommended-walking was suggested.

## 2020-01-30 NOTE — Assessment & Plan Note (Signed)
Deteriorated,  Rachel George is re-educated about the importance of exercise daily to help with weight management. A minumum of 30 minutes daily is recommended. Additionally, importance of healthy food choices  with portion control discussed.  Referral to nutritionist..  Wt Readings from Last 3 Encounters:  01/26/20 (!) 399 lb 1.9 oz (181 kg)  03/28/19 (!) 360 lb (163.3 kg)  12/05/18 (!) 360 lb (163.3 kg)

## 2020-01-30 NOTE — Assessment & Plan Note (Signed)
Needs Pap smear.  Referral to GYN today.

## 2020-01-30 NOTE — Assessment & Plan Note (Signed)
Needs updated labs.  Reports that she has not had very good control in the past.  Adjustment to medications as needed based off labs.  Referral to Dr. Dorris Fetch and a nutritionist of collaboration is appreciated in her care. She is advised to maintain checking her blood sugars we have ordered a kit for her.  She is encouraged to make sure she eats a heart healthy, diabetic friendly diet.  She is also encouraged to make sure that she is ambulating and walking 30 minutes at least 5 days a week if not more.

## 2020-01-30 NOTE — Assessment & Plan Note (Signed)
She reports mostly controlled on gabapentin.

## 2020-01-30 NOTE — Assessment & Plan Note (Signed)
Starting pepcid BID  Educated on heartburn diet.

## 2020-01-30 NOTE — Assessment & Plan Note (Signed)
Needs updated labs.

## 2020-01-30 NOTE — Assessment & Plan Note (Signed)
Needs updated labs.  And adjustment to any medications.  Reports that she was on atorvastatin 20+ pravastatin 40 mg I have advised for her to continue pravastatin until we have her labs back to see what she might need an adjustment that that she needs to be on a statin medication secondary to her diabetes.  She is in agreement and understanding of this.

## 2020-01-30 NOTE — Assessment & Plan Note (Signed)
PHQ 10.  Denies having any SI or HI at this time. Reports being on Lexapro adjustment to medications might be needed in near future.

## 2020-02-03 ENCOUNTER — Other Ambulatory Visit: Payer: Self-pay | Admitting: Family Medicine

## 2020-02-03 DIAGNOSIS — E559 Vitamin D deficiency, unspecified: Secondary | ICD-10-CM

## 2020-02-03 DIAGNOSIS — E1165 Type 2 diabetes mellitus with hyperglycemia: Secondary | ICD-10-CM

## 2020-02-03 DIAGNOSIS — Z794 Long term (current) use of insulin: Secondary | ICD-10-CM

## 2020-02-03 DIAGNOSIS — E785 Hyperlipidemia, unspecified: Secondary | ICD-10-CM

## 2020-02-03 MED ORDER — INSULIN DETEMIR 100 UNIT/ML ~~LOC~~ SOLN: 60.0000 [IU] | Freq: Every day | SUBCUTANEOUS | 11 refills | Status: DC

## 2020-02-03 MED ORDER — VITAMIN D (ERGOCALCIFEROL) 1.25 MG (50000 UNIT) PO CAPS: 50000.0000 [IU] | ORAL_CAPSULE | ORAL | 1 refills | Status: DC

## 2020-02-03 MED ORDER — ATORVASTATIN CALCIUM 20 MG PO TABS: 20.0000 mg | ORAL_TABLET | Freq: Every day | ORAL | 1 refills | Status: DC

## 2020-02-21 ENCOUNTER — Ambulatory Visit: Payer: Medicaid Other | Admitting: "Endocrinology

## 2020-03-01 ENCOUNTER — Encounter: Payer: Self-pay | Admitting: Family Medicine

## 2020-03-01 ENCOUNTER — Other Ambulatory Visit: Payer: Self-pay

## 2020-03-01 ENCOUNTER — Telehealth: Payer: Self-pay | Admitting: Advanced Practice Midwife

## 2020-03-01 ENCOUNTER — Ambulatory Visit (INDEPENDENT_AMBULATORY_CARE_PROVIDER_SITE_OTHER): Payer: Medicaid Other | Admitting: Family Medicine

## 2020-03-01 VITALS — BP 140/86 | HR 103 | Temp 97.3°F | Resp 17 | Ht 67.0 in | Wt >= 6400 oz

## 2020-03-01 DIAGNOSIS — G8929 Other chronic pain: Secondary | ICD-10-CM | POA: Diagnosis not present

## 2020-03-01 DIAGNOSIS — M25562 Pain in left knee: Secondary | ICD-10-CM

## 2020-03-01 DIAGNOSIS — K219 Gastro-esophageal reflux disease without esophagitis: Secondary | ICD-10-CM | POA: Diagnosis not present

## 2020-03-01 DIAGNOSIS — E1159 Type 2 diabetes mellitus with other circulatory complications: Secondary | ICD-10-CM

## 2020-03-01 DIAGNOSIS — M25561 Pain in right knee: Secondary | ICD-10-CM | POA: Diagnosis not present

## 2020-03-01 DIAGNOSIS — I10 Essential (primary) hypertension: Secondary | ICD-10-CM

## 2020-03-01 MED ORDER — KETOROLAC TROMETHAMINE 60 MG/2ML IM SOLN
60.0000 mg | Freq: Once | INTRAMUSCULAR | Status: AC
  Administered 2020-03-01: 60 mg via INTRAMUSCULAR

## 2020-03-01 MED ORDER — DICLOFENAC SODIUM 1 % EX GEL: 2.0000 g | Freq: Three times a day (TID) | CUTANEOUS | 0 refills | Status: AC | PRN

## 2020-03-01 MED ORDER — FAMOTIDINE 20 MG PO TABS: 20.0000 mg | ORAL_TABLET | Freq: Two times a day (BID) | ORAL | 1 refills | Status: DC

## 2020-03-01 MED ORDER — HYDROCHLOROTHIAZIDE 25 MG PO TABS: 25.0000 mg | ORAL_TABLET | Freq: Every day | ORAL | 1 refills | Status: DC

## 2020-03-01 MED ORDER — METHYLPREDNISOLONE ACETATE 80 MG/ML IJ SUSP
80.0000 mg | Freq: Once | INTRAMUSCULAR | Status: AC
  Administered 2020-03-01: 80 mg via INTRAMUSCULAR

## 2020-03-01 NOTE — Telephone Encounter (Signed)

## 2020-03-01 NOTE — Patient Instructions (Addendum)
I appreciate the opportunity to provide you with care for your health and wellness. Today we discussed: knee pain and BP   Follow up: 3 months   No labs   Referral today for Ortho for knee pain.  Injections today for pain in knees  Use Voltaren gel at bedtime or as needed. Use tylenol as well to help and heating pads  Please continue to practice social distancing to keep you, your family, and our community safe.  If you must go out, please wear a mask and practice good handwashing.  It was a pleasure to see you and I look forward to continuing to work together on your health and well-being. Please do not hesitate to call the office if you need care or have questions about your care.  Have a wonderful day and week. With Gratitude, Tereasa Coop, DNP, AGNP-BC

## 2020-03-01 NOTE — Assessment & Plan Note (Addendum)
Adjustment needed blood pressure still elevated today.  We will continue Cozaar 100 mg.  And add diuretic for better control.  Encouraged to maintain dash diet.

## 2020-03-01 NOTE — Progress Notes (Signed)
79 and 59        Subjective:  Patient ID: Rachel George, female    DOB: Apr 15, 1981  Age: 39 y.o. MRN: [id number]  CC:  Chief Complaint  Patient presents with  . Knee Pain    KNEE AND LEG PAIN STARTED LASTNIGHT   . Hypertension    follow up visit       HPI  HPI Rachel George is a 39 year old female patient who follows up today after establishing care back in February.  She has a history of depression, diabetes, fatty liver, glaucoma, hyperlipidemia, hypertension, macular degeneration, neuropathy, partial blindness.  At last visit she was referred to diabetic nutritionist, diabetic provider, GYN.  And started on Pepcid, twice daily for heartburn and educated on heartburn diet.  Today she reports that as of yesterday evening she has had some more discomfort in her right knee.  Reports that it was worse during the day.  Pulsating/Aching pain.  Walking a lot aggravates it and at night in the bed she reports it hurts more.  Tylenol helps some but not all the way.  Mainly at night she feels pain, knee hurts more with activity.   Pain score 6 out of 10 when it is aggravating her in pain.  Today patient denies signs and symptoms of COVID 19 infection including fever, chills, cough, shortness of breath, and headache. Past Medical, Surgical, Social History, Allergies, and Medications have been Reviewed.   Past Medical History:  Diagnosis Date  . Anemia   . Cataract   . Cellulitis of toe of right foot 10/07/2016  . Depression   . Diabetes mellitus without complication (HCC)    dx age 65  . Diabetic foot ulcer ([place]) 10/15/2016  . Fatty liver disease, nonalcoholic   . Glaucoma   . Hyperlipidemia   . Hypertension   . Macular degeneration   . Morbid obesity with BMI of 50.0-59.9, adult ([place]) 05/01/2017  . Neuropathy   . Obesity, Class III, BMI 40-49.9 (morbid obesity) ([place]) 05/01/2017  . 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 Osteomyelitis ([place]) 12/13/2016  . Partial  blindness   . 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 Suicidal ideation 11/04/2017    Current Meds  Medication Sig  . atorvastatin (LIPITOR) 20 MG tablet Take 1 tablet (20 mg total) by mouth daily at 6 PM.  . blood glucose meter kit and supplies Dispense based on patient and insurance preference. Use to test blood glucose 4 times daily. (FOR ICD-10 E10.9, E11.9).  . famotidine (PEPCID) 20 MG tablet Take 1 tablet (20 mg total) by mouth 2 (two) times daily.  Marland Kitchen gabapentin (NEURONTIN) 300 MG capsule Take 1 capsule (300 mg total) by mouth 2 (two) times daily. For pain  . insulin aspart (NOVOLOG) 100 UNIT/ML injection Inject 9 Units into the skin 3 (three) times daily before meals.  . insulin detemir (LEVEMIR) 100 UNIT/ML injection Inject 0.6 mLs (60 Units total) into the skin at bedtime.  Marland Kitchen losartan (COZAAR) 100 MG tablet Take 1 tablet (100 mg total) by mouth daily. For high blood pressure  . Vitamin D, Ergocalciferol, (DRISDOL) 1.25 MG (50000 UNIT) CAPS capsule Take 1 capsule (50,000 Units total) by mouth every 7 (seven) days.  . [DISCONTINUED] famotidine (PEPCID) 20 MG tablet Take 1 tablet (20 mg total) by mouth 2 (two) times daily.  . [DISCONTINUED] meloxicam (MOBIC) 7.5 MG tablet Take 7.5 mg by mouth daily.  . [DISCONTINUED] metFORMIN (GLUCOPHAGE) 1000 MG tablet Take  1,000 mg by mouth 2 (two) times daily with a meal.   Current Facility-Administered Medications for the 03/01/20 encounter (Office Visit) with Perlie Mayo, NP  Medication  . ketorolac (TORADOL) injection 60 mg  . methylPREDNISolone acetate (DEPO-MEDROL) injection 80 mg    ROS:  Review of Systems  Constitutional: Negative.   HENT: Negative.   Eyes: Negative.   Respiratory: Negative.   Cardiovascular: Negative.   Gastrointestinal: Negative.   Genitourinary: Negative.   Musculoskeletal: Positive for joint pain.  Skin: Negative.   Neurological: Negative.   Endo/Heme/Allergies: Negative.   Psychiatric/Behavioral:  Negative.   All other systems reviewed and are negative.    Objective:   Today's Vitals: BP 140/86   Pulse (!) 103   Temp (!) 97.3 F (36.3 C) (Temporal)   Resp 17   Ht '5\' 7"'$  (1.702 m)   Wt (!) 403 lb 1.9 oz (182.9 kg)   SpO2 97%   BMI 63.14 kg/m  Vitals with BMI 03/01/2020 01/26/2020 07/22/2019  Height '5\' 7"'$  '5\' 7"'$  -  Weight 403 lbs 2 oz 399 lbs 2 oz -  BMI 80.99 83.3 -  Systolic 825 053 976  Diastolic 86 94 84  Pulse 734 95 89  Some encounter information is confidential and restricted. Go to Review Flowsheets activity to see all data.     Physical Exam Vitals and nursing note reviewed.  Constitutional:      Appearance: Normal appearance. She is well-developed and well-groomed. She is obese.  HENT:     Head: Normocephalic and atraumatic.     Right Ear: External ear normal.     Left Ear: External ear normal.     Mouth/Throat:     Comments: Mask in place Eyes:     General:        Right eye: No discharge.        Left eye: No discharge.     Conjunctiva/sclera: Conjunctivae normal.  Cardiovascular:     Rate and Rhythm: Normal rate and regular rhythm.     Pulses: Normal pulses.     Heart sounds: Normal heart sounds.  Pulmonary:     Effort: Pulmonary effort is normal.     Breath sounds: Normal breath sounds.  Musculoskeletal:        General: Normal range of motion.     Cervical back: Normal range of motion and neck supple.  Skin:    General: Skin is warm.  Neurological:     General: No focal deficit present.     Mental Status: She is alert and oriented to person, place, and time.  Psychiatric:        Attention and Perception: Attention normal.        Mood and Affect: Mood normal.        Speech: Speech normal.        Behavior: Behavior normal. Behavior is cooperative.        Thought Content: Thought content normal.        Cognition and Memory: Cognition normal.        Judgment: Judgment normal.     Assessment   1. Hypertension associated with diabetes ([place])    2. Gastroesophageal reflux disease, unspecified whether esophagitis present   3. Chronic pain of both knees     Tests ordered Orders Placed This Encounter  Procedures  . Ambulatory referral to Orthopedic Surgery     Plan: Please see assessment and plan per problem list above.   Meds ordered this encounter  Medications  . famotidine (PEPCID) 20 MG tablet    Sig: Take 1 tablet (20 mg total) by mouth 2 (two) times daily.    Dispense:  30 tablet    Refill:  1  . hydrochlorothiazide (HYDRODIURIL) 25 MG tablet    Sig: Take 1 tablet (25 mg total) by mouth daily.    Dispense:  30 tablet    Refill:  1    Order Specific Question:   Supervising Provider    Answer:   SIMPSON, MARGARET E [[id number]]  . diclofenac Sodium (VOLTAREN) 1 % GEL    Sig: Apply 2 g topically 3 (three) times daily as needed.    Dispense:  50 g    Refill:  0    Order Specific Question:   Supervising Provider    Answer:   SIMPSON, MARGARET E [[id number]]  . ketorolac (TORADOL) injection 60 mg  . methylPREDNISolone acetate (DEPO-MEDROL) injection 80 mg    Patient to follow-up in 3 months or as needed.  Perlie Mayo, NP

## 2020-03-01 NOTE — Assessment & Plan Note (Signed)
Depo and Toradol Injections today Voltaren Gel for night time, referral to ortho today.

## 2020-03-06 ENCOUNTER — Other Ambulatory Visit: Payer: Medicaid Other | Admitting: Advanced Practice Midwife

## 2020-03-09 ENCOUNTER — Ambulatory Visit: Payer: Medicaid Other | Admitting: Nutrition

## 2020-03-16 ENCOUNTER — Ambulatory Visit (INDEPENDENT_AMBULATORY_CARE_PROVIDER_SITE_OTHER): Payer: Medicare Other | Admitting: "Endocrinology

## 2020-03-16 ENCOUNTER — Other Ambulatory Visit: Payer: Self-pay

## 2020-03-16 ENCOUNTER — Encounter: Payer: Self-pay | Admitting: "Endocrinology

## 2020-03-16 ENCOUNTER — Encounter: Payer: Self-pay | Admitting: Nutrition

## 2020-03-16 ENCOUNTER — Encounter: Payer: Medicare Other | Attending: Family Medicine | Admitting: Nutrition

## 2020-03-16 VITALS — Ht 67.0 in | Wt >= 6400 oz

## 2020-03-16 VITALS — BP 136/79 | HR 81 | Ht 67.0 in | Wt >= 6400 oz

## 2020-03-16 DIAGNOSIS — E1165 Type 2 diabetes mellitus with hyperglycemia: Secondary | ICD-10-CM

## 2020-03-16 DIAGNOSIS — E782 Mixed hyperlipidemia: Secondary | ICD-10-CM | POA: Diagnosis not present

## 2020-03-16 DIAGNOSIS — E1139 Type 2 diabetes mellitus with other diabetic ophthalmic complication: Secondary | ICD-10-CM | POA: Diagnosis not present

## 2020-03-16 DIAGNOSIS — E1159 Type 2 diabetes mellitus with other circulatory complications: Secondary | ICD-10-CM | POA: Diagnosis not present

## 2020-03-16 DIAGNOSIS — IMO0002 Reserved for concepts with insufficient information to code with codable children: Secondary | ICD-10-CM

## 2020-03-16 DIAGNOSIS — E559 Vitamin D deficiency, unspecified: Secondary | ICD-10-CM

## 2020-03-16 DIAGNOSIS — Z794 Long term (current) use of insulin: Secondary | ICD-10-CM | POA: Insufficient documentation

## 2020-03-16 DIAGNOSIS — I10 Essential (primary) hypertension: Secondary | ICD-10-CM | POA: Insufficient documentation

## 2020-03-16 MED ORDER — INSULIN ASPART 100 UNIT/ML ~~LOC~~ SOLN: 14.0000 [IU] | Freq: Three times a day (TID) | SUBCUTANEOUS | 2 refills | Status: DC

## 2020-03-16 MED ORDER — VITAMIN D (ERGOCALCIFEROL) 1.25 MG (50000 UNIT) PO CAPS: 50000.0000 [IU] | ORAL_CAPSULE | ORAL | 0 refills | Status: DC

## 2020-03-16 NOTE — Progress Notes (Signed)
Medical Nutrition Therapy:  Appt start time: 1300 end time:  1400.   Assessment:  Primary concerns today: Diabetes Type 2, morbid obesity and hyperlipidemia... Currenlty seperated and lives by herself. Eats 2 meals per day.  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 Has current blurry vision. 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  Novolog 9 units with meals and Levier 60 units a day. 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 Quite smoking 4 yrs ago. Gained 70 lbs since then.  FBS 161 mg/dl. BS ranges 160-250's.  Willing to make changes with diet and lifestyle to improve her DM and health. Has a boyfriend that will support her She admits to depression. Sees [facility].   Lab Results  Component Value Date   HGBA1C 10.3 (H) 01/26/2020   CMP Latest Ref Rng & Units 01/26/2020 07/22/2019 03/28/2019  Glucose 65 - 139 mg/dL 628(Z) 662(H) 476(L)  BUN 7 - 25 mg/dL 17 17 17   Creatinine 0.50 - 1.10 mg/dL 4.65) 0.35(W  Sodium 135 - 146 mmol/L 139 135 134(L)  Potassium 3.5 - 5.3 mmol/L 4.2 4.0 4.3  Chloride 98 - 110 mmol/L 107 102 106  CO2 20 - 32 mmol/L 25 21(L) 21(L)  Calcium 8.6 - 10.2 mg/dL 8.9 6.56) 8.1(E)  Total Protein 6.1 - 8.1 g/dL 6.7 - 7.2  Total Bilirubin 0.2 - 1.2 mg/dL 0.3 - 0.3  Alkaline Phos 38 - 126 U/L - - 83  AST 10 - 30 U/L 14 - 18  ALT 6 - 29 U/L 9 - 15   Lipid Panel     Component Value Date/Time   CHOL 215 (H) 01/26/2020 1447   TRIG 135 01/26/2020 1447   HDL 55 01/26/2020 1447   CHOLHDL 3.9 01/26/2020 1447   VLDL 34 12/07/2018 0639   LDLCALC 134 (H) 01/26/2020 1447    Preferred Learning Style:  Auditory  Hands on   Learning Readiness:  Not ready  Contemplating  Ready  Change in progress   MEDICATIONS:    DIETARY INTAKE:.    24-hr recall:  B ( AM):  Oatmeal with toast. Decaf sf coffee Snk ( AM):   L ( PM): Orange, water, protein shake , muscle milk,  Snk ( PM):  D ( PM): Cube steak, creamed potatoes and corn, Dt Dr  01/28/2020 Snk ( PM): 5 pb crackers with saltines, 1 cup  Beverages: Diet soda, wate  Usual physical activity:   Estimated energy needs: 1400 calories 158 g carbohydrates 105 g protein 39 g fat  Progress Towards Goal(s):  In progress.   Nutritional Diagnosis:  NB-1.1 Food and nutrition-related knowledge deficit As related to Diabetes Type 2.  As evidenced by A1c 10.3%.    Intervention:  Nutrition and Diabetes education provided on My Plate, CHO counting, meal planning, portion sizes, timing of meals, avoiding snacks between meals unless having a low blood sugar, target ranges for A1C and blood sugars, signs/symptoms and treatment of hyper/hypoglycemia, monitoring blood sugars, taking medications as prescribed, benefits of exercising 30 minutes per day and prevention of complications of DM. Goals Follow My Plate Eat Re[place] g CHO at meals Increase lower carb vegetables. Cut out processed and junk food Drink only water Walk 30 minutes daily. Lose 1-2 lbs per week Take insulin as prescribed. Get A1C down to 7%   Teaching Method Utilized:  Visual Auditory Hands on  Handouts given during visit include:  The Plate Method   Meal Plan Card  Diabetes Instructions.  Barriers to learning/adherence to lifestyle change: none  Demonstrated degree of understanding via:  Teach Back   Monitoring/Evaluation:  Dietary intake, exercise, , and body weight in 1 month(s).

## 2020-03-16 NOTE — Patient Instructions (Signed)
  Goals Follow My Plate Eat 68-11 g CHO at meals Increase lower carb vegetables. Cut out processed and junk food Drink only water Walk 30 minutes daily. Lose 1-2 lbs per week Take insulin as prescribed. Get A1C down to 7%

## 2020-03-16 NOTE — Progress Notes (Signed)
Endocrinology Consult Note       03/16/2020, 4:19 PM   Subjective:    Patient ID: Rachel George, female    DOB: 04-Nov-1981.  Rachel George is being seen in consultation for management of currently uncontrolled symptomatic diabetes requested by  Rachel Mayo, NP.   Past Medical History:  Diagnosis Date  . Anemia   . Cataract   . Cellulitis of toe of right foot 10/07/2016  . Depression   . Diabetes mellitus without complication (HCC)    dx age 39  . Diabetic foot ulcer ([place]) 10/15/2016  . Fatty liver disease, nonalcoholic   . Glaucoma   . Hyperlipidemia   . Hypertension   . Macular degeneration   . Morbid obesity with BMI of 50.0-59.9, adult ([place]) 05/01/2017  . Neuropathy   . Obesity, Class III, BMI 40-49.9 (morbid obesity) ([place]) 05/01/2017  . 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 Osteomyelitis ([place]) 12/13/2016  . Partial blindness   . 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 Suicidal ideation 11/04/2017    Past Surgical History:  Procedure Laterality Date  . AMPUTATION TOE Right 12/16/2016   Procedure: RIGHT FIRST TOE AMPUTATION;  Surgeon: Vickie Epley, MD;  Location: [facility];  Service: General;  Laterality: Right;  Right first toe amputation for osteomyelitis  . eye lid transplant    . RETINAL LASER PROCEDURE      Social History   Socioeconomic History  . Marital status: Legally Separated    Spouse name: Rachel George  . Number of children: 0  . Years of education: Not on file  . Highest education level: High school graduate  Occupational History  . Occupation: CNA personal care giver  Tobacco Use  . Smoking status: Former Smoker    Packs/day: 0.25    Types: Cigarettes    Quit date: 12/11/2016    Years since quitting: 3.2  . Smokeless tobacco: Never Used  Substance and Sexual Activity  . Alcohol use: No  . Drug use: No  . Sexual activity: Yes     Partners: Male    Birth control/protection: None  Other Topics Concern  . Not on file  Social History Narrative   Separated from husband-married for 7 years      Lives alone right now, in a disability apartment.   Right handed      Enjoys walking       Diet: eat one meal a day- veggies, meats, low carb    Caffeine: tea-4 cups daily    Water: 4-6 bottles of water      Wears seat belt   Smoke detectors    Does not use phone while driving   Social Determinants of Health   Financial Resource Strain: Unknown  . Difficulty of Paying Living Expenses: Patient refused  Food Insecurity: Unknown  . Worried About Charity fundraiser in the Last Year: Patient refused  . Ran Out of Food in the Last Year: Patient refused  Transportation Needs:   . Film/video editor (Medical):   Marland Kitchen Lack of Transportation (Non-Medical):   Physical Activity: Unknown  .  Days of Exercise per Week: Patient refused  . Minutes of Exercise per Session: Patient refused  Stress: Unknown  . Feeling of Stress : Patient refused  Social Connections: Unknown  . Frequency of Communication with Friends and Family: Patient refused  . Frequency of Social Gatherings with Friends and Family: Patient refused  . Attends Religious Services: Patient refused  . Active Member of Clubs or Organizations: Not on file  . Attends Archivist Meetings: Not on file  . Marital Status: Patient refused    Family History  Problem Relation Age of Onset  . Diabetes Mother   . Thyroid disease Mother   . Diabetes Father     Outpatient Encounter Medications as of 03/16/2020  Medication Sig  . atorvastatin (LIPITOR) 20 MG tablet Take 1 tablet (20 mg total) by mouth daily at 6 PM.  . blood glucose meter kit and supplies Dispense based on patient and insurance preference. Use to test blood glucose 4 times daily. (FOR ICD-10 E10.9, E11.9).  Marland Kitchen diclofenac Sodium (VOLTAREN) 1 % GEL Apply 2 g topically 3 (three) times daily as  needed.  . famotidine (PEPCID) 20 MG tablet Take 1 tablet (20 mg total) by mouth 2 (two) times daily.  . ferrous sulfate 324 MG TBEC Take 65 mg by mouth.  . gabapentin (NEURONTIN) 300 MG capsule Take 1 capsule (300 mg total) by mouth 2 (two) times daily. For pain  . hydrochlorothiazide (HYDRODIURIL) 25 MG tablet Take 1 tablet (25 mg total) by mouth daily.  . insulin aspart (NOVOLOG) 100 UNIT/ML injection Inject 14-20 Units into the skin 3 (three) times daily before meals.  . insulin detemir (LEVEMIR) 100 UNIT/ML injection Inject 0.6 mLs (60 Units total) into the skin at bedtime.  . Vitamin D, Ergocalciferol, (DRISDOL) 1.25 MG (50000 UNIT) CAPS capsule Take 1 capsule (50,000 Units total) by mouth every 7 (seven) days.  . [DISCONTINUED] insulin aspart (NOVOLOG) 100 UNIT/ML injection Inject 9 Units into the skin 3 (three) times daily before meals.  . [DISCONTINUED] losartan (COZAAR) 100 MG tablet Take 1 tablet (100 mg total) by mouth daily. For high blood pressure   No facility-administered encounter medications on file as of 03/16/2020.    ALLERGIES: Allergies  Allergen Reactions  . Shellfish Allergy Anaphylaxis, Shortness Of Breath and Swelling    Facial Swelling  . Percocet [Oxycodone-Acetaminophen] Itching and Nausea And Vomiting    VACCINATION STATUS: Immunization History  Administered Date(s) Administered  . Influenza,inj,Quad PF,6+ Mos 12/14/2016, 09/11/2017, 12/08/2018  . Influenza-Unspecified 09/01/2017    Diabetes She presents for her initial diabetic visit. She has type 2 diabetes mellitus. Onset time: She was diagnosed at approximate age of 39 years. Her disease course has been worsening. There are no hypoglycemic associated symptoms. Pertinent negatives for hypoglycemia include no confusion, headaches, pallor or seizures. Associated symptoms include blurred vision, fatigue, polyphagia, polyuria and visual change. Pertinent negatives for diabetes include no chest pain and no  polydipsia. There are no hypoglycemic complications. Symptoms are worsening. Diabetic complications include heart disease, peripheral neuropathy, PVD and retinopathy. Risk factors for coronary artery disease include diabetes mellitus, dyslipidemia, family history, obesity, hypertension and sedentary lifestyle. Current diabetic treatment includes insulin injections. Her weight is increasing steadily. She is following a generally unhealthy diet. When asked about meal planning, she reported none. She has had a previous visit with a dietitian. She never participates in exercise. Her breakfast blood glucose range is generally >200 mg/dl. Her lunch blood glucose range is generally >200 mg/dl. Her dinner  blood glucose range is generally >200 mg/dl. Her bedtime blood glucose range is generally >200 mg/dl. Her overall blood glucose range is >200 mg/dl. (She brought her meter showing random glucose 130 range and consistently 152-372.  Her A1c recently was 10.2%) An ACE inhibitor/angiotensin II receptor blocker is not being taken. Eye exam is current.  Hyperlipidemia This is a chronic problem. The current episode started more than 1 year ago. The problem is uncontrolled. Exacerbating diseases include diabetes and obesity. Pertinent negatives include no chest pain, myalgias or shortness of breath. Risk factors for coronary artery disease include dyslipidemia, diabetes mellitus, hypertension, obesity, family history, post-menopausal and a sedentary lifestyle.  Hypertension This is a chronic problem. The current episode started more than 1 year ago. The problem is controlled. Associated symptoms include blurred vision. Pertinent negatives include no chest pain, headaches, palpitations or shortness of breath. Risk factors for coronary artery disease include dyslipidemia, diabetes mellitus, obesity, sedentary lifestyle and family history. Past treatments include angiotensin blockers and diuretics. Hypertensive end-organ damage  includes heart failure, PVD and retinopathy.     Review of Systems  Constitutional: Positive for fatigue. Negative for chills, fever and unexpected weight change.  HENT: Negative for trouble swallowing and voice change.   Eyes: Positive for blurred vision. Negative for visual disturbance.  Respiratory: Negative for cough, shortness of breath and wheezing.   Cardiovascular: Negative for chest pain, palpitations and leg swelling.  Gastrointestinal: Negative for diarrhea, nausea and vomiting.  Endocrine: Positive for polyphagia and polyuria. Negative for cold intolerance, heat intolerance and polydipsia.  Musculoskeletal: Positive for gait problem. Negative for arthralgias and myalgias.  Skin: Negative for color change, pallor, rash and wound.  Neurological: Negative for seizures and headaches.  Psychiatric/Behavioral: Negative for confusion and suicidal ideas.    Objective:    Vitals with BMI 03/16/2020 03/16/2020 03/01/2020  Height _0  _1  _2   Weight 402 lbs 402 lbs 403 lbs 2 oz  BMI 62.95 32.44 01.02  Systolic 725 - 366  Diastolic 79 - 86  Pulse 81 - 103  Some encounter information is confidential and restricted. Go to Review Flowsheets activity to see all data.    BP 136/79   Pulse 81   Ht _3  (1.702 m)   Wt (!) 402 lb (182.3 kg)   BMI 62.96 kg/m   Wt Readings from Last 3 Encounters:  03/16/20 (!) 402 lb (182.3 kg)  03/16/20 (!) 402 lb (182.3 kg)  03/01/20 (!) 403 lb 1.9 oz (182.9 kg)     Physical Exam Constitutional:      Appearance: She is well-developed.  HENT:     Head: Normocephalic and atraumatic.  Neck:     Thyroid: No thyromegaly.     Trachea: No tracheal deviation.  Cardiovascular:     Rate and Rhythm: Normal rate and regular rhythm.  Pulmonary:     Effort: Pulmonary effort is normal.  Abdominal:     General: Bowel sounds are normal.     Palpations: Abdomen is soft.     Tenderness: There is no abdominal tenderness. There is no guarding.   Musculoskeletal:        General: Normal range of motion.     Cervical back: Normal range of motion and neck supple.     Comments: Partial amputation of right foot.  Skin:    General: Skin is warm and dry.     Coloration: Skin is not pale.     Findings: No erythema or rash.  Neurological:  Mental Status: She is alert and oriented to person, place, and time.     Cranial Nerves: No cranial nerve deficit.     Coordination: Coordination normal.     Deep Tendon Reflexes: Reflexes are normal and symmetric.  Psychiatric:        Judgment: Judgment normal.       CMP ( most recent) CMP     Component Value Date/Time   NA 139 01/26/2020 1447   K 4.2 01/26/2020 1447   CL 107 01/26/2020 1447   CO2 25 01/26/2020 1447   GLUCOSE 167 (H) 01/26/2020 1447   BUN 17 01/26/2020 1447   CREATININE 1.02 01/26/2020 1447   CALCIUM 8.9 01/26/2020 1447   PROT 6.7 01/26/2020 1447   ALBUMIN 3.5 03/28/2019 1424   AST 14 01/26/2020 1447   ALT 9 01/26/2020 1447   ALKPHOS 83 03/28/2019 1424   BILITOT 0.3 01/26/2020 1447   GFRNONAA 69 01/26/2020 1447   GFRAA 80 01/26/2020 1447     Diabetic Labs (most recent): Lab Results  Component Value Date   HGBA1C 10.3 (H) 01/26/2020   HGBA1C 11.3 (H) 03/30/2019   HGBA1C 8.6 (H) 12/07/2018     Lipid Panel ( most recent) Lipid Panel     Component Value Date/Time   CHOL 215 (H) 01/26/2020 1447   TRIG 135 01/26/2020 1447   HDL 55 01/26/2020 1447   CHOLHDL 3.9 01/26/2020 1447   VLDL 34 12/07/2018 0639   LDLCALC 134 (H) 01/26/2020 1447       Results for JENISHA, FAISON (MRN [id number]) as of 03/16/2020 16:54  Ref. Range 03/30/2019 06:44 07/22/2019 17:39 01/26/2020 14:47  eAG (mmol/L) Latest Units: (calc)   13.8  Glucose Latest Ref Range: 65 - 139 mg/dL  296 (H) 167 (H)  Hemoglobin A1C Latest Ref Range: <5.7 % of total Hgb 11.3 (H)  10.3 (H)  TSH Latest Units: mIU/L   0.88      Assessment & Plan:   1.  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 Recent labs reviewed. - I had a long discussion with her about the progressive nature of diabetes and the pathology behind its complications. -her diabetes is complicated by ophthalmopathy, macular degeneration, CHF, peripheral arterial disease with partial amputation of right foot, and she remains at a high risk for more acute and chronic complications which include CAD, CVA, CKD, retinopathy, and neuropathy. These are all discussed in detail with her.  - I have counseled her on diet  and weight management  by adopting a carbohydrate restricted/protein rich diet. Patient is encouraged to switch to  unprocessed or minimally processed     complex starch and increased protein intake (animal or plant source), fruits, and vegetables. -  she is advised to stick to a routine mealtimes to eat 3 meals  a day and avoid unnecessary snacks ( to snack only to correct hypoglycemia).   - she admits that there is a room for improvement in her food and drink choices. - Suggestion is made for her to avoid simple carbohydrates  from her diet including Cakes, Sweet Desserts, Ice Cream, Soda (diet and regular), Sweet Tea, Candies, Chips, Cookies, Store Bought Juices, Alcohol in Excess of  1-2 drinks a day, Artificial Sweeteners,  Coffee Creamer, and "Sugar-free" Products. This will help patient to have more stable  blood glucose profile and potentially avoid unintended weight gain.  - she will be scheduled with Jearld Fenton, RDN, CDE for diabetes education.  - I have approached her with the following individualized plan to manage  her diabetes and patient agrees:   - she will continue to need intensive treatment with basal/bolus insulin in order for her to achieve control of diabetes to target.    -In preparation, she is  advised to continue Levemir 60 units nightly, increase NovoLog to 14  units 3 times a day with meals  for pre-meal BG readings of 90-153m/dl, plus patient specific correction dose for unexpected hyperglycemia above 1572mdl, associated with strict monitoring of glucose 4 times a day-before meals and at bedtime. - she is warned not to take insulin without proper monitoring per orders. - Adjustment parameters are given to her for hypo and hyperglycemia in writing. - she is encouraged to call clinic for blood glucose levels less than 70 or above 300 mg /dl.  - she is not a candidate for SGLT2 inhibitors due to amputation risk.  - she will be considered for incretin therapy as appropriate next visit.  - Specific targets for  A1c;  LDL, HDL,  and Triglycerides were discussed with the patient.  2) Blood Pressure /Hypertension:  her blood pressure is  controlled to target.   she is advised to continue her current medications including hydrochlorothiazide, losartan 100 mg p.o. daily.  3) Lipids/Hyperlipidemia:   Review of her recent lipid panel showed un controlled  LDL at 134 .  she  is advised to continue    atorvastatin 20 mg daily at bedtime.  Side effects and precautions discussed with her.  4)  Weight/Diet:  Body mass index is 62.96 kg/m.  -   clearly complicating her diabetes care.   she is  a candidate for weight loss. I discussed with her the fact that loss of 5 - 10% of her  current body weight will have the most impact on her diabetes management.  Exercise, and detailed carbohydrates information provided  -  detailed on discharge instructions. She will benefit the most from bariatric surgery which is briefly discussed with her.  5) vitamin D deficiency: She is on ongoing treatment with vitamin D2 50,000 units weekly, advised to continue for 12 weeks.  6) Chronic Care/Health Maintenance:  -she  is on ACEI/ARB and Statin medications and  is encouraged to initiate and continue to follow up  with Ophthalmology, Dentist,  Podiatrist at least yearly or according to recommendations, and advised to   stay away from smoking. I have recommended yearly flu vaccine and pneumonia vaccine at least every 5 years; moderate intensity exercise for up to 150 minutes weekly; and  sleep for at least 7 hours a day.  - she is  advised to maintain close follow up with MiPerlie MayoNP for primary care needs, as well as her other providers for optimal and coordinated care.   - Time spent in this patient care: 65 min, of which > 50% was spent in  counseling  her about her currently uncontrolled, complicated type 2 diabetes; hyperlipidemia, hypertension and the rest reviewing her blood glucose logs , discussing her hypoglycemia and hyperglycemia episodes, reviewing her current and  previous labs / studies  ( including abstraction from other facilities) and medications  doses and developing a  long term treatment plan based on the latest standards of care/ guidelines; and documenting her care.    Please refer to Patient  Instructions for Blood Glucose Monitoring and Insulin/Medications Dosing Guide"  in media tab for additional information. Please  also refer to " Patient Self Inventory" in the Media  tab for reviewed elements of pertinent patient history.  Elwin Mocha participated in the discussions, expressed understanding, and voiced agreement with the above plans.  All questions were answered to her satisfaction. she is encouraged to contact clinic should she have any questions or concerns prior to her return visit.   Follow up plan: - Return in about 10 days (around 03/26/2020) for Follow up with Meter and Logs Only - no Labs.  Glade Lloyd, MD [facility] [address] [place], [place] [address] Phone: [phone]  Fax: [phone]    03/16/2020, 4:19 PM  This note was partially dictated with voice recognition software. Similar  sounding words can be transcribed inadequately or may not  be corrected upon review.

## 2020-03-16 NOTE — Patient Instructions (Signed)

## 2020-04-04 ENCOUNTER — Ambulatory Visit: Payer: Medicare Other | Admitting: "Endocrinology

## 2020-04-05 ENCOUNTER — Telehealth: Payer: Self-pay | Admitting: Advanced Practice Midwife

## 2020-04-05 ENCOUNTER — Ambulatory Visit: Payer: Medicare Other | Admitting: Orthopedic Surgery

## 2020-04-05 NOTE — Telephone Encounter (Signed)

## 2020-04-06 ENCOUNTER — Ambulatory Visit: Payer: Medicare Other | Admitting: Orthopedic Surgery

## 2020-04-06 ENCOUNTER — Other Ambulatory Visit: Payer: Medicaid Other | Admitting: Advanced Practice Midwife

## 2020-04-10 ENCOUNTER — Encounter: Payer: Self-pay | Admitting: Orthopedic Surgery

## 2020-05-03 DIAGNOSIS — F329 Major depressive disorder, single episode, unspecified: Secondary | ICD-10-CM | POA: Diagnosis not present

## 2020-05-08 ENCOUNTER — Other Ambulatory Visit: Payer: Self-pay | Admitting: *Deleted

## 2020-05-08 DIAGNOSIS — E1165 Type 2 diabetes mellitus with hyperglycemia: Secondary | ICD-10-CM

## 2020-05-08 DIAGNOSIS — E785 Hyperlipidemia, unspecified: Secondary | ICD-10-CM

## 2020-05-08 DIAGNOSIS — K219 Gastro-esophageal reflux disease without esophagitis: Secondary | ICD-10-CM

## 2020-05-08 MED ORDER — INSULIN DETEMIR 100 UNIT/ML ~~LOC~~ SOLN: 60.0000 [IU] | Freq: Every day | SUBCUTANEOUS | 11 refills | Status: DC

## 2020-05-08 MED ORDER — ATORVASTATIN CALCIUM 20 MG PO TABS: 20.0000 mg | ORAL_TABLET | Freq: Every day | ORAL | 1 refills | Status: DC

## 2020-05-08 MED ORDER — FAMOTIDINE 20 MG PO TABS: 20.0000 mg | ORAL_TABLET | Freq: Two times a day (BID) | ORAL | 1 refills | Status: DC

## 2020-05-10 ENCOUNTER — Telehealth: Payer: Self-pay | Admitting: Advanced Practice Midwife

## 2020-05-10 NOTE — Telephone Encounter (Signed)

## 2020-05-11 ENCOUNTER — Other Ambulatory Visit: Payer: Medicaid Other | Admitting: Advanced Practice Midwife

## 2020-05-17 ENCOUNTER — Telehealth: Payer: Self-pay | Admitting: Advanced Practice Midwife

## 2020-05-17 NOTE — Telephone Encounter (Signed)
Called patient regarding appointment and the following message was left:   Updated visitor policy: We are now allowing one support person with you during your upcoming visit.  However, we do ask that they wear a mask and will also be screened at check-in.   We ask if you are sick, have any symptoms of COVID, have had any exposure to anyone suspected or confirmed of having COVID-19, or are awaiting test results for COVID-19, to call our office as we may need to reschedule you for a virtual visit or schedule your appointment for a later date.    Please know we will ask you these questions or similar questions when you arrive for your appointment and understand this is how we are keeping everyone safe.    Also,to keep you safe, please use the provided hand sanitizer when you enter the office. We are asking everyone in the office to wear a mask to help prevent the spread of germs. If you have a mask of your own, please wear it to your appointment, if not, we are happy to provide one for you.  Thank you for understanding and your cooperation.    [facility] Staff      

## 2020-05-18 ENCOUNTER — Other Ambulatory Visit: Payer: Medicaid Other | Admitting: Advanced Practice Midwife

## 2020-05-22 ENCOUNTER — Other Ambulatory Visit: Payer: Self-pay | Admitting: *Deleted

## 2020-05-22 DIAGNOSIS — E785 Hyperlipidemia, unspecified: Secondary | ICD-10-CM

## 2020-05-22 MED ORDER — ATORVASTATIN CALCIUM 20 MG PO TABS: 20.0000 mg | ORAL_TABLET | Freq: Every day | ORAL | 1 refills | Status: DC

## 2020-06-07 ENCOUNTER — Other Ambulatory Visit: Payer: Self-pay

## 2020-06-07 ENCOUNTER — Ambulatory Visit: Payer: Medicaid Other | Admitting: Family Medicine

## 2020-06-07 ENCOUNTER — Telehealth: Payer: Self-pay

## 2020-06-07 DIAGNOSIS — Z794 Long term (current) use of insulin: Secondary | ICD-10-CM

## 2020-06-07 MED ORDER — INSULIN DETEMIR 100 UNIT/ML ~~LOC~~ SOLN: 60.0000 [IU] | Freq: Every day | SUBCUTANEOUS | 11 refills | Status: DC

## 2020-06-07 MED ORDER — INSULIN ASPART 100 UNIT/ML ~~LOC~~ SOLN: 14.0000 [IU] | Freq: Three times a day (TID) | SUBCUTANEOUS | 11 refills | Status: DC

## 2020-06-07 NOTE — Telephone Encounter (Signed)
Needs levemer called in

## 2020-06-07 NOTE — Telephone Encounter (Signed)
Medication has been sent in 

## 2020-06-12 ENCOUNTER — Telehealth: Payer: Self-pay | Admitting: Family Medicine

## 2020-06-12 NOTE — Telephone Encounter (Signed)
Verbal orders given to change for pt to be easier to draw up

## 2020-06-12 NOTE — Telephone Encounter (Signed)
Pharmacy called per pt request to have insulin switched to pens due to poor vision versus having to be drawn up, they need to know if they can make the change. [phone]

## 2020-06-14 ENCOUNTER — Ambulatory Visit: Payer: Medicaid Other | Admitting: Family Medicine

## 2020-06-20 ENCOUNTER — Other Ambulatory Visit: Payer: Self-pay

## 2020-06-20 ENCOUNTER — Encounter: Payer: Self-pay | Admitting: Family Medicine

## 2020-06-20 ENCOUNTER — Other Ambulatory Visit (HOSPITAL_COMMUNITY)
Admission: RE | Admit: 2020-06-20 | Discharge: 2020-06-20 | Disposition: A | Discharge status: Home / Self Care | Payer: Medicare HMO | Source: Ambulatory Visit | Attending: Family Medicine | Admitting: Family Medicine

## 2020-06-20 ENCOUNTER — Ambulatory Visit (INDEPENDENT_AMBULATORY_CARE_PROVIDER_SITE_OTHER): Payer: Medicare HMO | Admitting: Family Medicine

## 2020-06-20 VITALS — BP 132/84 | HR 94 | Temp 97.4°F | Resp 16 | Ht 67.0 in | Wt >= 6400 oz

## 2020-06-20 DIAGNOSIS — E1165 Type 2 diabetes mellitus with hyperglycemia: Secondary | ICD-10-CM | POA: Diagnosis not present

## 2020-06-20 DIAGNOSIS — E1159 Type 2 diabetes mellitus with other circulatory complications: Secondary | ICD-10-CM | POA: Diagnosis not present

## 2020-06-20 DIAGNOSIS — I10 Essential (primary) hypertension: Secondary | ICD-10-CM

## 2020-06-20 DIAGNOSIS — L680 Hirsutism: Secondary | ICD-10-CM | POA: Diagnosis not present

## 2020-06-20 DIAGNOSIS — E1139 Type 2 diabetes mellitus with other diabetic ophthalmic complication: Secondary | ICD-10-CM

## 2020-06-20 DIAGNOSIS — Z89411 Acquired absence of right great toe: Secondary | ICD-10-CM | POA: Diagnosis not present

## 2020-06-20 DIAGNOSIS — IMO0002 Reserved for concepts with insufficient information to code with codable children: Secondary | ICD-10-CM

## 2020-06-20 DIAGNOSIS — E282 Polycystic ovarian syndrome: Secondary | ICD-10-CM | POA: Insufficient documentation

## 2020-06-20 DIAGNOSIS — E559 Vitamin D deficiency, unspecified: Secondary | ICD-10-CM

## 2020-06-20 DIAGNOSIS — E782 Mixed hyperlipidemia: Secondary | ICD-10-CM

## 2020-06-20 LAB — POCT GLYCOSYLATED HEMOGLOBIN (HGB A1C)
HbA1c POC (<> result, manual entry): 9.8 % (ref 4.0–5.6)
HbA1c, POC (controlled diabetic range): 9.8 % — AB (ref 0.0–7.0)
HbA1c, POC (prediabetic range): 9.8 % — AB (ref 5.7–6.4)
Hemoglobin A1C: 9.8 % — AB (ref 4.0–5.6)

## 2020-06-20 MED ORDER — SPIRONOLACTONE 50 MG PO TABS: 50.0000 mg | ORAL_TABLET | Freq: Two times a day (BID) | ORAL | 1 refills | Status: DC

## 2020-06-20 NOTE — Assessment & Plan Note (Signed)
Needs to get her updated labs.  Before we do any continue treatment of this by prescription versus over-the-counter.

## 2020-06-20 NOTE — Assessment & Plan Note (Signed)
She is now being followed by Dr. Fransico Him.  Improved some- A1c 9.8%.  She reports that it is hard for her to eat a healthy diet secondary to cost. But she reports she will continue to do the best she can. Continue current statin medication if needed will increase this dosage along with maybe starting fish oil.

## 2020-06-20 NOTE — Assessment & Plan Note (Signed)
Continue Cozaar at 100 mg.  Did add hydrochlorothiazide however we will stop hydrochlorothiazide today and start her on her lactone for other benefits that this medication offers.  Will be getting updated labs this week and 4 weeks post starting medication.  Encouraged DASH diet and exercise as tolerated.

## 2020-06-20 NOTE — Assessment & Plan Note (Signed)
Will be getting updated labs.  I continue to encourage her to focus on a heart healthy low-fat diet.

## 2020-06-20 NOTE — Assessment & Plan Note (Signed)
Questionable PCOS syndrome, does have unwanted hair.  Acne and several other signs or symptoms.  Will start spironolactone 50 mg twice daily.  Getting kidney function prior to start.  And repeat in 1 month.  Educated on the possibility of low potassium and how to combat that.  As long as she is doing well and tolerates this we will continue to increase the dose to help with side effects of possible PCOS.  Reviewed side effects, risks and benefits of medication.   Patient acknowledged agreement and understanding of the plan.

## 2020-06-20 NOTE — Patient Instructions (Addendum)
I appreciate the opportunity to provide you with care for your health and wellness. Today we discussed: overall health   Follow up: 3 weeks by phone for check new medication   Fasting Labs this week  No referrals today  A1c today: 9.8%, there has been improvement! Keep it up!  Continue to eat well like you have been.  Start Spironolactone as directed.   Please continue to practice social distancing to keep you, your family, and our community safe.  If you must go out, please wear a mask and practice good handwashing.  It was a pleasure to see you and I look forward to continuing to work together on your health and well-being. Please do not hesitate to call the office if you need care or have questions about your care.  Have a wonderful day and week. With Gratitude, Tereasa Coop, DNP, AGNP-BC

## 2020-06-20 NOTE — Progress Notes (Signed)
Subjective:  Patient ID: Rachel George, female    DOB: 22-May-1981  Age: 39 y.o. MRN: [id number]  CC:  Chief Complaint  Patient presents with   Follow-up    3 month follow up humana form to be filled out for pt       HPI  HPI   Rachel George is a 39 year old female patient who presents today for her 45-monthfollow-up.  She has a history that is consistent with but not limited to diabetes, fatty liver disease, hypertension, hyperlipidemia, neuropathy, partial blindness.  She reports taking all her medications as directed at this time.  She has been trying to eat better, eat clean diet, oatmeal and salads.  Has been trying her best it is more expensive for her.  A1c checked today in the office 9.8% down from 10.3%.  Will be getting some updated labs.  Is willing to switch from hydrochlorothiazide to spironolactone to see if this will help with some of the PCOS-like symptoms that she experiences.  As she reports that her weight has not really budged even though she has been trying to eat much better.  She knows that she has room for improvement but overall she has been trying to stick to a good diet.  She does ask if I recommend fish oil.  However she needs to get some updated labs to see where her lab levels are prior to starting any medications.  Today patient denies signs and symptoms of COVID 19 infection including fever, chills, cough, shortness of breath, and headache. Past Medical, Surgical, Social History, Allergies, and Medications have been Reviewed.   Past Medical History:  Diagnosis Date   Anemia    Cataract    Cellulitis of toe of right foot 10/07/2016   Depression    Diabetes mellitus without complication ([facility]    dx age 89  Diabetic foot ulcer ([facility] 10/15/2016   Fatty liver disease, nonalcoholic    Glaucoma    Hyperlipidemia    Hypertension    Macular degeneration    Morbid obesity with BMI of 50.0-59.9, adult ([facility] 05/01/2017   Neuropathy     Obesity, Class III, BMI 40-49.9 (morbid obesity) ([facility] 05/01/2017   Osteolysis, right ankle and foot 05/01/2017   [place]omyelitis ([facility] 12/13/2016   Partial blindness    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   blood glucose meter kit and supplies Dispense based on patient and insurance preference. Use to test blood glucose 4 times daily. (FOR ICD-10 E10.9, E11.9).   diclofenac Sodium (VOLTAREN) 1 % GEL Apply 2 g topically 3 (three) times daily as needed.   famotidine (PEPCID) 20 MG tablet Take 1 tablet (20 mg total) by mouth 2 (two) times daily.   ferrous sulfate 324 MG TBEC Take 65 mg by mouth.   gabapentin (NEURONTIN) 300 MG capsule Take 1 capsule (300 mg total) by mouth 2 (two) times daily. For pain   insulin aspart (NOVOLOG) 100 UNIT/ML injection Inject 14-20 Units into the skin 3 (three) times daily before meals.   insulin detemir (LEVEMIR) 100 UNIT/ML injection Inject 0.6 mLs (60 Units total) into the skin at bedtime.   Vitamin D, Ergocalciferol, (DRISDOL) 1.25 MG (50000 UNIT) CAPS capsule Take 1 capsule (50,000 Units total) by mouth every 7 (seven) days.   [  DISCONTINUED] hydrochlorothiazide (HYDRODIURIL) 25 MG tablet Take 1 tablet (25 mg total) by mouth daily.    ROS:  Review of Systems  Constitutional: Negative.   HENT: Negative.   Eyes: Negative.   Respiratory: Negative.   Cardiovascular: Negative.   Gastrointestinal: Negative.   Genitourinary: Negative.   Musculoskeletal: Negative.   Skin: Negative.   Neurological: Negative.   Endo/Heme/Allergies: Negative.   Psychiatric/Behavioral: Negative.   All other systems reviewed and are negative.    Objective:   Today's Vitals: BP 132/84 (BP Location: Right Arm, Patient Position: Sitting, Cuff Size: Normal)    Pulse 94    Temp (!) 97.4 F (36.3 C)  (Temporal)    Resp 16    Ht _0  (1.702 m)    Wt (!) 401 lb 1.9 oz (181.9 kg)    SpO2 96%    BMI 62.82 kg/m  Vitals with BMI 06/20/2020 03/16/2020 03/16/2020  Height _1  _2  _3   Weight 401 lbs 2 oz 402 lbs 402 lbs  BMI 62.81 30.09 23.30  Systolic 076 226 -  Diastolic 84 79 -  Pulse 94 81 -  Some encounter information is confidential and restricted. Go to Review Flowsheets activity to see all data.     Physical Exam Vitals and nursing note reviewed.  Constitutional:      Appearance: Normal appearance. She is well-developed and well-groomed. She is obese.  HENT:     Head: Normocephalic and atraumatic.     Right Ear: External ear normal.     Left Ear: External ear normal.     Mouth/Throat:     Comments: Mask in place  Eyes:     General:        Right eye: No discharge.        Left eye: No discharge.     Conjunctiva/sclera: Conjunctivae normal.  Cardiovascular:     Rate and Rhythm: Normal rate and regular rhythm.     Pulses: Normal pulses.     Heart sounds: Normal heart sounds.  Pulmonary:     Effort: Pulmonary effort is normal.     Breath sounds: Normal breath sounds.  Musculoskeletal:        General: Normal range of motion.     Cervical back: Normal range of motion and neck supple.  Skin:    General: Skin is warm.  Neurological:     General: No focal deficit present.     Mental Status: She is alert and oriented to person, place, and time.  Psychiatric:        Attention and Perception: Attention normal.        Mood and Affect: Mood normal.        Speech: Speech normal.        Behavior: Behavior normal. Behavior is cooperative.        Thought Content: Thought content normal.        Cognition and Memory: Cognition normal.        Judgment: Judgment normal.      Assessment   1. DM (diabetes mellitus), type 2, uncontrolled w/ophthalmic complication ([place])   2. Hypertension associated with diabetes ([place])   3. Vitamin D deficiency   4. Mixed hyperlipidemia   5.  Hirsutism     Tests ordered Orders Placed This Encounter  Procedures   Microalbumin, urine   CBC   Comprehensive metabolic panel   Lipid panel   VITAMIN D 25 Hydroxy (Vit-D Deficiency, Fractures)   POCT glycosylated hemoglobin (Hb A1C)  Plan: Please see assessment and plan per problem list above.   Meds ordered this encounter  Medications   spironolactone (ALDACTONE) 50 MG tablet    Sig: Take 1 tablet (50 mg total) by mouth 2 (two) times daily.    Dispense:  60 tablet    Refill:  1    Order Specific Question:   Supervising Provider    Answer:   Fayrene Helper [[id number]]    Patient to follow-up in 3 weeks   Perlie Mayo, NP

## 2020-06-21 ENCOUNTER — Other Ambulatory Visit: Payer: Self-pay | Admitting: Family Medicine

## 2020-06-21 LAB — MICROALBUMIN, URINE: Microalb, Ur: 889.2 ug/mL — ABNORMAL HIGH

## 2020-06-21 MED ORDER — LISINOPRIL 10 MG PO TABS: 10.0000 mg | ORAL_TABLET | Freq: Every day | ORAL | 3 refills | Status: DC

## 2020-07-11 ENCOUNTER — Encounter: Payer: Self-pay | Admitting: Family Medicine

## 2020-07-11 ENCOUNTER — Telehealth (INDEPENDENT_AMBULATORY_CARE_PROVIDER_SITE_OTHER): Payer: Medicare HMO | Admitting: Family Medicine

## 2020-07-11 ENCOUNTER — Other Ambulatory Visit: Payer: Self-pay

## 2020-07-11 VITALS — BP 132/84 | Ht 67.0 in | Wt >= 6400 oz

## 2020-07-11 DIAGNOSIS — E1159 Type 2 diabetes mellitus with other circulatory complications: Secondary | ICD-10-CM | POA: Diagnosis not present

## 2020-07-11 DIAGNOSIS — L680 Hirsutism: Secondary | ICD-10-CM | POA: Diagnosis not present

## 2020-07-11 DIAGNOSIS — I10 Essential (primary) hypertension: Secondary | ICD-10-CM

## 2020-07-11 NOTE — Assessment & Plan Note (Signed)
Continue Cozaar 100 mg.   I encouraged her to make sure that she gets her labs this week so that we can make sure everything is doing okay.  Encouraged DASH diet and exercise as tolerated.

## 2020-07-11 NOTE — Progress Notes (Signed)
Virtual Visit via Telephone Note   This visit type was conducted due to national recommendations for restrictions regarding the COVID-19 Pandemic (e.g. social distancing) in an effort to limit this patient's exposure and mitigate transmission in our community.  Due to her co-morbid illnesses, this patient is at least at moderate risk for complications without adequate follow up.  This format is felt to be most appropriate for this patient at this time.  The patient did not have access to video technology/had technical difficulties with video requiring transitioning to audio format only (telephone).  All issues noted in this document were discussed and addressed.  No physical exam could be performed with this format.   Evaluation Performed:  Follow-up visit  Date:  07/11/2020   ID:  Rachel George, Rachel George Jul 05, 1981, MRN [id number]  Patient Location: Home Provider Location: Office/Clinic  Location of Patient: Home Location of Provider: Telehealth Consent was obtain for visit to be over via telehealth. I verified that I am speaking with the correct person using two identifiers.  PCP:  Perlie Mayo, NP   Chief Complaint: Follow-up on adjustment from hydrochlorothiazide to spironolactone   History of Present Illness:    Rachel George is a 39 y.o. female with questionable history of PCOS, obesity, hypertension among others.  About 2 and half weeks ago I switch her from hydrochlorothiazide to spironolactone.  For dual therapy against unwanted hair and PCOS-like signs and symptoms.  She reports today she is doing well she can tell that she feels better overall.  Is liking the medication is about to go get her updated labs to make sure her kidney and potassium are doing good  The patient does not have symptoms concerning for COVID-19 infection (fever, chills, cough, or new shortness of breath).   Past Medical, Surgical, Social History, Allergies, and Medications have been  Reviewed.  Past Medical History:  Diagnosis Date  . Anemia   . Cataract   . Cellulitis of toe of right foot 10/07/2016  . Depression   . Diabetes mellitus without complication (HCC)    dx age 71  . Diabetic foot ulcer ([place]) 10/15/2016  . Fatty liver disease, nonalcoholic   . Glaucoma   . Hyperlipidemia   . Hypertension   . Macular degeneration   . Morbid obesity with BMI of 50.0-59.9, adult ([place]) 05/01/2017  . Neuropathy   . Obesity, Class III, BMI 40-49.9 (morbid obesity) ([place]) 05/01/2017  . 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 Osteomyelitis ([place]) 12/13/2016  . Partial blindness   . 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 Suicidal ideation 11/04/2017   Past Surgical History:  Procedure Laterality Date  . AMPUTATION TOE Right 12/16/2016   Procedure: RIGHT FIRST TOE AMPUTATION;  Surgeon: Vickie Epley, MD;  Location: [facility];  Service: General;  Laterality: Right;  Right first toe amputation for osteomyelitis  . eye lid transplant    . RETINAL LASER PROCEDURE       Current Meds  Medication Sig  . atorvastatin (LIPITOR) 20 MG tablet Take 1 tablet (20 mg total) by mouth daily at 6 PM.  . blood glucose meter kit and supplies Dispense based on patient and insurance preference. Use to test blood glucose 4 times daily. (FOR ICD-10 E10.9, E11.9).  Marland Kitchen diclofenac Sodium (VOLTAREN) 1 % GEL Apply 2 g topically 3 (three) times daily as needed.  . famotidine (PEPCID) 20 MG tablet Take 1 tablet (20 mg total) by mouth  2 (two) times daily.  . ferrous sulfate 324 MG TBEC Take 65 mg by mouth.  . gabapentin (NEURONTIN) 300 MG capsule Take 1 capsule (300 mg total) by mouth 2 (two) times daily. For pain  . insulin aspart (NOVOLOG) 100 UNIT/ML injection Inject 14-20 Units into the skin 3 (three) times daily before meals.  . insulin detemir (LEVEMIR) 100 UNIT/ML injection Inject 0.6 mLs (60 Units total) into the skin at bedtime.  . lisinopril (ZESTRIL) 10 MG tablet Take 1 tablet (10 mg  total) by mouth daily.  . spironolactone (ALDACTONE) 50 MG tablet Take 1 tablet (50 mg total) by mouth 2 (two) times daily.  . Vitamin D, Ergocalciferol, (DRISDOL) 1.25 MG (50000 UNIT) CAPS capsule Take 1 capsule (50,000 Units total) by mouth every 7 (seven) days.     Allergies:   Shellfish allergy and Percocet [oxycodone-acetaminophen]   ROS:   Please see the history of present illness.     All other systems reviewed and are negative.   Labs/Other Tests and Data Reviewed:    Recent Labs: 01/26/2020: ALT 9; BUN 17; Creat 1.02; Hemoglobin 11.9; Platelets 298; Potassium 4.2; Sodium 139; TSH 0.88   Recent Lipid Panel Lab Results  Component Value Date/Time   CHOL 215 (H) 01/26/2020 02:47 PM   TRIG 135 01/26/2020 02:47 PM   HDL 55 01/26/2020 02:47 PM   CHOLHDL 3.9 01/26/2020 02:47 PM   LDLCALC 134 (H) 01/26/2020 02:47 PM    Wt Readings from Last 3 Encounters:  07/11/20 (!) 401 lb (181.9 kg)  06/20/20 (!) 401 lb 1.9 oz (181.9 kg)  03/16/20 (!) 402 lb (182.3 kg)     Objective:    Vital Signs:  BP 132/84   Ht 5' 7" (1.702 m)   Wt (!) 401 lb (181.9 kg)   BMI 62.81 kg/m    VITAL SIGNS:  reviewed GEN:  Alert and oriented RESPIRATORY:  No shortness of breath noted in conversation PSYCH:  normal affect and mood   ASSESSMENT & PLAN:    1. Hypertension associated with diabetes (HCC)   2. Hirsutism    Time:   Today, I have spent 10 minutes with the patient with telehealth technology discussing the above problems.     Medication Adjustments/Labs and Tests Ordered: Current medicines are reviewed at length with the patient today.  Concerns regarding medicines are outlined above.   Tests Ordered: No orders of the defined types were placed in this encounter.   Medication Changes: No orders of the defined types were placed in this encounter.   Disposition:  Follow up 4-6 weeks  Signed, Hannah M Mills, NP  07/11/2020 4:19 PM     [facility]  

## 2020-07-11 NOTE — Patient Instructions (Signed)
I appreciate the opportunity to provide you with care for your health and wellness. Today we discussed: recent med change  Follow up: 4-6 weeks  Labs--- please get soon  No referrals today  Please continue to practice social distancing to keep you, your family, and our community safe.  If you must go out, please wear a mask and practice good handwashing.  It was a pleasure to see you and I look forward to continuing to work together on your health and well-being. Please do not hesitate to call the office if you need care or have questions about your care.  Have a wonderful day and week. With Gratitude, Tereasa Coop, DNP, AGNP-BC

## 2020-07-11 NOTE — Assessment & Plan Note (Signed)
Is on spironolactone 50 mg twice daily.  Reports that she can tell that she is having some improvement in her symptoms.  She is going to get her labs drawn she did not get her kidney function drawn prior to the start.  However she reports that she will get it done today. She is educated on the possibility of low potassium.  And how to combat that.  She denies having any signs symptoms of low potassium.  Overall feels well.  Would like to continue the medication at this time.

## 2020-07-13 DIAGNOSIS — E559 Vitamin D deficiency, unspecified: Secondary | ICD-10-CM | POA: Diagnosis not present

## 2020-07-13 DIAGNOSIS — Z794 Long term (current) use of insulin: Secondary | ICD-10-CM | POA: Diagnosis not present

## 2020-07-13 DIAGNOSIS — E782 Mixed hyperlipidemia: Secondary | ICD-10-CM | POA: Diagnosis not present

## 2020-07-13 DIAGNOSIS — E1165 Type 2 diabetes mellitus with hyperglycemia: Secondary | ICD-10-CM | POA: Diagnosis not present

## 2020-07-14 LAB — CBC
Hematocrit: 32.8 % — ABNORMAL LOW (ref 34.0–46.6)
Hemoglobin: 10.7 g/dL — ABNORMAL LOW (ref 11.1–15.9)
MCH: 25.1 pg — ABNORMAL LOW (ref 26.6–33.0)
MCHC: 32.6 g/dL (ref 31.5–35.7)
MCV: 77 fL — ABNORMAL LOW (ref 79–97)
Platelets: 206 10*3/uL (ref 150–450)
RBC: 4.26 x10E6/uL (ref 3.77–5.28)
RDW: 15.3 % (ref 11.7–15.4)
WBC: 9.9 10*3/uL (ref 3.4–10.8)

## 2020-07-14 LAB — COMPREHENSIVE METABOLIC PANEL
ALT: 7 IU/L (ref 0–32)
AST: 10 IU/L (ref 0–40)
Albumin/Globulin Ratio: 1.2 (ref 1.2–2.2)
Albumin: 3.5 g/dL — ABNORMAL LOW (ref 3.8–4.8)
Alkaline Phosphatase: 86 IU/L (ref 48–121)
BUN/Creatinine Ratio: 13 (ref 9–23)
BUN: 16 mg/dL (ref 6–24)
Bilirubin Total: 0.3 mg/dL (ref 0.0–1.2)
CO2: 20 mmol/L (ref 20–29)
Calcium: 8.5 mg/dL — ABNORMAL LOW (ref 8.7–10.2)
Chloride: 107 mmol/L — ABNORMAL HIGH (ref 96–106)
Creatinine, Ser: 1.21 mg/dL — ABNORMAL HIGH (ref 0.57–1.00)
GFR calc Af Amer: 65 mL/min/{1.73_m2} (ref >59)
GFR calc non Af Amer: 56 mL/min/{1.73_m2} — ABNORMAL LOW (ref >59)
Globulin, Total: 3 g/dL (ref 1.5–4.5)
Glucose: 178 mg/dL — ABNORMAL HIGH (ref 65–99)
Potassium: 4.1 mmol/L (ref 3.5–5.2)
Sodium: 140 mmol/L (ref 134–144)
Total Protein: 6.5 g/dL (ref 6.0–8.5)

## 2020-07-14 LAB — LIPID PANEL
Chol/HDL Ratio: 2.3 ratio (ref 0.0–4.4)
Cholesterol, Total: 126 mg/dL (ref 100–199)
HDL: 54 mg/dL (ref >39)
LDL Chol Calc (NIH): 55 mg/dL (ref 0–99)
Triglycerides: 87 mg/dL (ref 0–149)
VLDL Cholesterol Cal: 17 mg/dL (ref 5–40)

## 2020-07-14 LAB — VITAMIN D 25 HYDROXY (VIT D DEFICIENCY, FRACTURES): Vit D, 25-Hydroxy: 10.6 ng/mL — ABNORMAL LOW (ref 30.0–100.0)

## 2020-07-18 ENCOUNTER — Other Ambulatory Visit: Payer: Self-pay | Admitting: Family Medicine

## 2020-07-18 DIAGNOSIS — E559 Vitamin D deficiency, unspecified: Secondary | ICD-10-CM

## 2020-07-18 MED ORDER — VITAMIN D (ERGOCALCIFEROL) 1.25 MG (50000 UNIT) PO CAPS: 50000.0000 [IU] | ORAL_CAPSULE | ORAL | 0 refills | Status: DC

## 2020-07-21 DIAGNOSIS — H4051X2 Glaucoma secondary to other eye disorders, right eye, moderate stage: Secondary | ICD-10-CM | POA: Diagnosis not present

## 2020-07-21 DIAGNOSIS — E113523 Type 2 diabetes mellitus with proliferative diabetic retinopathy with traction retinal detachment involving the macula, bilateral: Secondary | ICD-10-CM | POA: Diagnosis not present

## 2020-08-08 ENCOUNTER — Encounter: Payer: Self-pay | Admitting: Family Medicine

## 2020-08-08 ENCOUNTER — Other Ambulatory Visit: Payer: Self-pay

## 2020-08-08 ENCOUNTER — Telehealth (INDEPENDENT_AMBULATORY_CARE_PROVIDER_SITE_OTHER): Payer: Medicare HMO | Admitting: Family Medicine

## 2020-08-08 VITALS — BP 132/84 | Ht 67.0 in | Wt >= 6400 oz

## 2020-08-08 DIAGNOSIS — E1139 Type 2 diabetes mellitus with other diabetic ophthalmic complication: Secondary | ICD-10-CM

## 2020-08-08 DIAGNOSIS — L680 Hirsutism: Secondary | ICD-10-CM

## 2020-08-08 DIAGNOSIS — IMO0002 Reserved for concepts with insufficient information to code with codable children: Secondary | ICD-10-CM

## 2020-08-08 DIAGNOSIS — E1165 Type 2 diabetes mellitus with hyperglycemia: Secondary | ICD-10-CM | POA: Diagnosis not present

## 2020-08-08 NOTE — Patient Instructions (Addendum)
I appreciate the opportunity to provide you with care for your health and wellness. Today we discussed: medications  Follow up: Flu Shot appt and Feb for CPE- no pap   No labs or referrals today  So glad all is going well and that you are tolerating the changes we have made. Continue all medications for now we will follow up at next appt.  Stay safe and healthy  Please continue to practice social distancing to keep you, your family, and our community safe.  If you must go out, please wear a mask and practice good handwashing.  It was a pleasure to see you and I look forward to continuing to work together on your health and well-being. Please do not hesitate to call the office if you need care or have questions about your care.  Have a wonderful day and week. With Gratitude, Tereasa Coop, DNP, AGNP-BC

## 2020-08-08 NOTE — Progress Notes (Signed)
Virtual Visit via Telephone Note   This visit type was conducted due to national recommendations for restrictions regarding the COVID-19 Pandemic (e.g. social distancing) in an effort to limit this patient's exposure and mitigate transmission in our community.  Due to her co-morbid illnesses, this patient is at least at moderate risk for complications without adequate follow up.  This format is felt to be most appropriate for this patient at this time.  The patient did not have access to video technology/had technical difficulties with video requiring transitioning to audio format only (telephone).  All issues noted in this document were discussed and addressed.  No physical exam could be performed with this format.   Evaluation Performed:  Follow-up visit  Date:  08/08/2020   ID:  Rachel George, Rachel George 11/14/1981, MRN [id number]  Patient Location: Home Provider Location: Office/Clinic  Location of Patient: Home Location of Provider: Telehealth Consent was obtain for visit to be over via telehealth. I verified that I am speaking with the correct person using two identifiers.  PCP:  Perlie Mayo, NP   Chief Complaint: Medication follow-up  History of Present Illness:    Rachel George is a 39 y.o. female with with questionable history of PCOS, obesity, hypertension among others.  Back at end of July, I switch her from hydrochlorothiazide to spironolactone.  For dual therapy against unwanted hair and PCOS-like signs and symptoms.  She reports today she is doing well she can tell that she feels better overall.  Overall she is tolerating the medication based off of labs as well.  We will continue the current medication treatment as ordered.  Additionally asked about her diabetes.  She reports that her fasting blood sugars have been improved.  Range 1 20-1 50.  Highest was 180.  And is because she went out and forgot her medicine at home.  She denies having any excessive  lows.  Denies having any polyuria polydipsia, polyphagia.  The patient does not have symptoms concerning for COVID-19 infection (fever, chills, cough, or new shortness of breath).   Past Medical, Surgical, Social History, Allergies, and Medications have been Reviewed.  Past Medical History:  Diagnosis Date  . Anemia   . Cataract   . Cellulitis of toe of right foot 10/07/2016  . Depression   . Diabetes mellitus without complication (HCC)    dx age 64  . Diabetic foot ulcer ([place]) 10/15/2016  . Fatty liver disease, nonalcoholic   . Glaucoma   . Hyperlipidemia   . Hypertension   . Macular degeneration   . Morbid obesity with BMI of 50.0-59.9, adult ([place]) 05/01/2017  . Neuropathy   . Obesity, Class III, BMI 40-49.9 (morbid obesity) ([place]) 05/01/2017  . 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 Osteomyelitis ([place]) 12/13/2016  . Partial blindness   . 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 Suicidal ideation 11/04/2017   Past Surgical History:  Procedure Laterality Date  . AMPUTATION TOE Right 12/16/2016   Procedure: RIGHT FIRST TOE AMPUTATION;  Surgeon: Vickie Epley, MD;  Location: [facility];  Service: General;  Laterality: Right;  Right first toe amputation for osteomyelitis  . eye lid transplant    . RETINAL LASER PROCEDURE       Current Meds  Medication Sig  . atorvastatin (LIPITOR) 20 MG tablet Take 1 tablet (20 mg total) by mouth daily at 6 PM.  . blood glucose meter kit and supplies Dispense based on patient and  insurance preference. Use to test blood glucose 4 times daily. (FOR ICD-10 E10.9, E11.9).  Marland Kitchen diclofenac Sodium (VOLTAREN) 1 % GEL Apply 2 g topically 3 (three) times daily as needed.  . famotidine (PEPCID) 20 MG tablet Take 1 tablet (20 mg total) by mouth 2 (two) times daily.  . ferrous sulfate 324 MG TBEC Take 65 mg by mouth.  . gabapentin (NEURONTIN) 300 MG capsule Take 1 capsule (300 mg total) by mouth 2 (two) times daily. For pain  . insulin aspart (NOVOLOG)  100 UNIT/ML injection Inject 14-20 Units into the skin 3 (three) times daily before meals.  . insulin detemir (LEVEMIR) 100 UNIT/ML injection Inject 0.6 mLs (60 Units total) into the skin at bedtime.  Marland Kitchen lisinopril (ZESTRIL) 10 MG tablet Take 1 tablet (10 mg total) by mouth daily.  Marland Kitchen spironolactone (ALDACTONE) 50 MG tablet Take 1 tablet (50 mg total) by mouth 2 (two) times daily.  . Vitamin D, Ergocalciferol, (DRISDOL) 1.25 MG (50000 UNIT) CAPS capsule Take 1 capsule (50,000 Units total) by mouth every 7 (seven) days.     Allergies:   Shellfish allergy and Percocet [oxycodone-acetaminophen]   ROS:   Please see the history of present illness.    All other systems reviewed and are negative.   Labs/Other Tests and Data Reviewed:    Recent Labs: 01/26/2020: TSH 0.88 07/13/2020: ALT 7; BUN 16; Creatinine, Ser 1.21; Hemoglobin 10.7; Platelets 206; Potassium 4.1; Sodium 140   Recent Lipid Panel Lab Results  Component Value Date/Time   CHOL 126 07/13/2020 10:09 AM   TRIG 87 07/13/2020 10:09 AM   HDL 54 07/13/2020 10:09 AM   CHOLHDL 2.3 07/13/2020 10:09 AM   CHOLHDL 3.9 01/26/2020 02:47 PM   LDLCALC 55 07/13/2020 10:09 AM   LDLCALC 134 (H) 01/26/2020 02:47 PM    Wt Readings from Last 3 Encounters:  08/08/20 (!) 401 lb (181.9 kg)  07/11/20 (!) 401 lb (181.9 kg)  06/20/20 (!) 401 lb 1.9 oz (181.9 kg)     Objective:    Vital Signs:  BP 132/84   Ht $R'5\' 7"'cN$  (1.702 m)   Wt (!) 401 lb (181.9 kg)   BMI 62.81 kg/m    VITAL SIGNS:  reviewed GEN:  alert and oriented  RESPIRATORY:  no shortness of breath in conversation  PSYCH:  normal affect and mood   ASSESSMENT & PLAN:    1. Hirsutism   2. DM (diabetes mellitus), type 2, uncontrolled w/ophthalmic complication ([place])  Time:   Today, I have spent 10 minutes with the patient with telehealth technology discussing the above problems.     Medication Adjustments/Labs and Tests Ordered: Current medicines are reviewed at length with the  patient today.  Concerns regarding medicines are outlined above.   Tests Ordered: No orders of the defined types were placed in this encounter.   Medication Changes: No orders of the defined types were placed in this encounter.   Disposition:  Follow up Feb for CPE-fasting labs same day Signed, Perlie Mayo, NP  08/08/2020 3:51 PM     [facility]

## 2020-08-08 NOTE — Assessment & Plan Note (Signed)
She reports better fasting numbers, Is followed by Dr Fransico Him. She is hoping her next A1c shows her improvement.  Advised her to continue her statin medication.  In addition to continuing all medications secondary to what Dr. Fransico Him has prescribed

## 2020-08-08 NOTE — Assessment & Plan Note (Signed)
She is doing great on 50 mg twice daily.  Continue this current dose labs were good.  Denies having any unwanted hair anymore.

## 2020-08-11 ENCOUNTER — Ambulatory Visit: Payer: Medicare HMO

## 2020-08-30 DIAGNOSIS — F329 Major depressive disorder, single episode, unspecified: Secondary | ICD-10-CM | POA: Diagnosis not present

## 2020-08-30 DIAGNOSIS — I10 Essential (primary) hypertension: Secondary | ICD-10-CM | POA: Diagnosis not present

## 2020-08-30 DIAGNOSIS — E785 Hyperlipidemia, unspecified: Secondary | ICD-10-CM | POA: Diagnosis not present

## 2020-08-30 DIAGNOSIS — K219 Gastro-esophageal reflux disease without esophagitis: Secondary | ICD-10-CM | POA: Diagnosis not present

## 2020-08-30 DIAGNOSIS — Z794 Long term (current) use of insulin: Secondary | ICD-10-CM | POA: Diagnosis not present

## 2020-08-30 DIAGNOSIS — E113532 Type 2 diabetes mellitus with proliferative diabetic retinopathy with traction retinal detachment not involving the macula, left eye: Secondary | ICD-10-CM | POA: Diagnosis not present

## 2020-08-30 DIAGNOSIS — E1140 Type 2 diabetes mellitus with diabetic neuropathy, unspecified: Secondary | ICD-10-CM | POA: Diagnosis not present

## 2020-08-30 DIAGNOSIS — H3342 Traction detachment of retina, left eye: Secondary | ICD-10-CM | POA: Diagnosis not present

## 2020-08-30 DIAGNOSIS — E113592 Type 2 diabetes mellitus with proliferative diabetic retinopathy without macular edema, left eye: Secondary | ICD-10-CM | POA: Diagnosis not present

## 2020-08-30 DIAGNOSIS — K7581 Nonalcoholic steatohepatitis (NASH): Secondary | ICD-10-CM | POA: Diagnosis not present

## 2020-08-31 DIAGNOSIS — F329 Major depressive disorder, single episode, unspecified: Secondary | ICD-10-CM | POA: Diagnosis not present

## 2020-08-31 DIAGNOSIS — Z794 Long term (current) use of insulin: Secondary | ICD-10-CM | POA: Diagnosis not present

## 2020-08-31 DIAGNOSIS — K219 Gastro-esophageal reflux disease without esophagitis: Secondary | ICD-10-CM | POA: Diagnosis not present

## 2020-08-31 DIAGNOSIS — H3342 Traction detachment of retina, left eye: Secondary | ICD-10-CM | POA: Diagnosis not present

## 2020-08-31 DIAGNOSIS — K7581 Nonalcoholic steatohepatitis (NASH): Secondary | ICD-10-CM | POA: Diagnosis not present

## 2020-08-31 DIAGNOSIS — E113592 Type 2 diabetes mellitus with proliferative diabetic retinopathy without macular edema, left eye: Secondary | ICD-10-CM | POA: Diagnosis not present

## 2020-08-31 DIAGNOSIS — I10 Essential (primary) hypertension: Secondary | ICD-10-CM | POA: Diagnosis not present

## 2020-08-31 DIAGNOSIS — E1140 Type 2 diabetes mellitus with diabetic neuropathy, unspecified: Secondary | ICD-10-CM | POA: Diagnosis not present

## 2020-08-31 DIAGNOSIS — E785 Hyperlipidemia, unspecified: Secondary | ICD-10-CM | POA: Diagnosis not present

## 2020-09-01 ENCOUNTER — Ambulatory Visit: Payer: Medicare HMO

## 2020-09-04 ENCOUNTER — Other Ambulatory Visit: Payer: Self-pay | Admitting: Family Medicine

## 2020-09-04 DIAGNOSIS — K219 Gastro-esophageal reflux disease without esophagitis: Secondary | ICD-10-CM

## 2020-09-11 ENCOUNTER — Other Ambulatory Visit: Payer: Self-pay

## 2020-09-11 DIAGNOSIS — K219 Gastro-esophageal reflux disease without esophagitis: Secondary | ICD-10-CM

## 2020-09-11 MED ORDER — FAMOTIDINE 20 MG PO TABS: 20.0000 mg | ORAL_TABLET | Freq: Two times a day (BID) | ORAL | 1 refills | Status: DC

## 2020-10-03 ENCOUNTER — Encounter: Payer: Self-pay | Admitting: Family Medicine

## 2020-10-03 ENCOUNTER — Ambulatory Visit (INDEPENDENT_AMBULATORY_CARE_PROVIDER_SITE_OTHER): Payer: Medicare HMO | Admitting: Family Medicine

## 2020-10-03 ENCOUNTER — Other Ambulatory Visit: Payer: Self-pay

## 2020-10-03 VITALS — BP 140/74 | HR 90 | Ht 67.0 in | Wt >= 6400 oz

## 2020-10-03 DIAGNOSIS — Z23 Encounter for immunization: Secondary | ICD-10-CM

## 2020-10-03 DIAGNOSIS — R300 Dysuria: Secondary | ICD-10-CM | POA: Diagnosis not present

## 2020-10-03 DIAGNOSIS — Z794 Long term (current) use of insulin: Secondary | ICD-10-CM | POA: Diagnosis not present

## 2020-10-03 DIAGNOSIS — N926 Irregular menstruation, unspecified: Secondary | ICD-10-CM

## 2020-10-03 DIAGNOSIS — E1165 Type 2 diabetes mellitus with hyperglycemia: Secondary | ICD-10-CM | POA: Diagnosis not present

## 2020-10-03 DIAGNOSIS — I10 Essential (primary) hypertension: Secondary | ICD-10-CM | POA: Diagnosis not present

## 2020-10-03 LAB — POCT URINALYSIS DIPSTICK
Bilirubin, UA: NEGATIVE
Glucose, UA: NEGATIVE
Ketones, UA: NEGATIVE
Leukocytes, UA: NEGATIVE
Nitrite, UA: POSITIVE
Protein, UA: POSITIVE — AB
Spec Grav, UA: 1.025 (ref 1.010–1.025)
Urobilinogen, UA: 0.2 E.U./dL
pH, UA: 6 (ref 5.0–8.0)

## 2020-10-03 MED ORDER — NITROFURANTOIN MONOHYD MACRO 100 MG PO CAPS: 100.0000 mg | ORAL_CAPSULE | Freq: Two times a day (BID) | ORAL | 0 refills | Status: AC

## 2020-10-03 NOTE — Patient Instructions (Signed)
  HAPPY FALL!  I appreciate the opportunity to provide you with care for your health and wellness. Today we discussed: several concerns  Follow up: 4-6 weeks for weight and BP check  Labs- [facility] today No referrals today  Start Macrobid for UTI  Continue all medications until I get updated labs  Please continue to practice social distancing to keep you, your family, and our community safe.  If you must go out, please wear a mask and practice good handwashing.  It was a pleasure to see you and I look forward to continuing to work together on your health and well-being. Please do not hesitate to call the office if you need care or have questions about your care.  Have a wonderful day and week. With Gratitude, Tereasa Coop, DNP, AGNP-BC

## 2020-10-03 NOTE — Progress Notes (Signed)
° ° ° °Subjective:  °Patient ID: Rachel George, female    DOB: 12/11/1980  Age: 39 y.o. MRN: [id number] ° °CC:  °Chief Complaint  °Patient presents with  °• Dysuria  °  burning when urinating x1 week  °• Urinary Frequency  °  x1 week  °  ° ° °HPI  °Rachel George is a 39-year-old female patient of mine.  Who presents today for an acute visit secondary to having increased urinary tract infection symptoms. ° °Urinary Tract Infection  °This is a new problem. The current episode started in the past 7 days. The problem occurs every urination. The problem has been gradually worsening. The quality of the pain is described as burning and aching. The pain is at a severity of 6/10. The pain is moderate. There has been no fever. She is not sexually active. There is no history of pyelonephritis. Associated symptoms include frequency, hematuria and urgency. Pertinent negatives include no flank pain or possible pregnancy. She has tried increased fluids for the symptoms. The treatment provided no relief.  ° °Additionally she reports that she feels like she is swelling.  She reports that she has a difficult time laying down, hands and legs are swollen at times.  She reports that her cycles been acting up been strange off and on.  Would like to see GYN for this.  Has gained 26 pounds since her last visit.  She thinks this is mostly related to fluid at this time.  Is willing to get flu shot today.  And updated labs. ° ° °Today patient denies signs and symptoms of COVID 19 infection including fever, chills, cough, shortness of breath, and headache. °Past Medical, Surgical, Social History, Allergies, and Medications have been Reviewed. ° ° °Past Medical History:  °Diagnosis Date  °• Anemia   °• Cataract   °• Cellulitis of toe of right foot 10/07/2016  °• Depression   °• Diabetes mellitus without complication (HCC)   ° dx age 19  °• Diabetic foot ulcer (HCC) 10/15/2016  °• Fatty liver disease, nonalcoholic   °• Glaucoma   °•  Hyperlipidemia   °• Hypertension   °• Macular degeneration   °• Morbid obesity with BMI of 50.0-59.9, adult (HCC) 05/01/2017  °• Neuropathy   °• Obesity, Class III, BMI 40-49.9 (morbid obesity) (HCC) 05/01/2017  °• Osteolysis, right ankle and foot 05/01/2017  °• Osteomyelitis (HCC) 12/13/2016  °• Partial blindness   °• 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  °• blood glucose meter kit and supplies Dispense based on patient and insurance preference. Use to test blood glucose 4 times daily. (FOR ICD-10 E10.9, E11.9).  °• diclofenac Sodium (VOLTAREN) 1 % GEL Apply 2 g topically 3 (three) times daily as needed.  °• famotidine (PEPCID) 20 MG tablet Take 1 tablet (20 mg total) by mouth 2 (two) times daily.  °• ferrous sulfate 324 MG TBEC Take 65 mg by mouth.  °• gabapentin (NEURONTIN) 300 MG capsule Take 1 capsule (300 mg total) by mouth 2 (two) times daily. For pain  °• insulin aspart (NOVOLOG) 100 UNIT/ML injection Inject 14-20 Units into the skin 3 (three) times daily before meals.  °• insulin detemir (LEVEMIR) 100 UNIT/ML injection Inject 0.6 mLs (60 Units total) into the skin at bedtime.  °• lisinopril (ZESTRIL) 10 MG   tablet Take 1 tablet (10 mg total) by mouth daily.  °• spironolactone (ALDACTONE) 50 MG tablet Take 1 tablet (50 mg total) by mouth 2 (two) times daily.  °• Vitamin D, Ergocalciferol, (DRISDOL) 1.25 MG (50000 UNIT) CAPS capsule Take 1 capsule (50,000 Units total) by mouth every 7 (seven) days.  ° ° °ROS:  °Review of Systems  °Genitourinary: Positive for frequency, hematuria and urgency. Negative for flank pain.  °     Irregular cycles  ° ° ° °Objective:  ° °Today's Vitals: BP 140/74 (BP Location: Left Arm, Patient Position: Sitting, Cuff Size: Large)    Pulse 90    Ht 5' 7" (1.702 m)    Wt (!) 427 lb (193.7 kg)    LMP 09/08/2020    SpO2 97%    BMI  66.88 kg/m²  °Vitals with BMI 10/03/2020 08/08/2020 07/11/2020  °Height 5' 7" 5' 7" 5' 7"  °Weight 427 lbs 401 lbs 401 lbs  °BMI 66.86 62.79 62.79  °Systolic 140 132 132  °Diastolic 74 84 84  °Pulse 90 - -  °Some encounter information is confidential and restricted. Go to Review Flowsheets activity to see all data.  °  ° °Physical Exam °Vitals and nursing note reviewed.  °Constitutional:   °   Appearance: Normal appearance. She is well-developed and well-groomed. She is morbidly obese.  °HENT:  °   Head: Normocephalic and atraumatic.  °   Right Ear: External ear normal.  °   Left Ear: External ear normal.  °   Mouth/Throat:  °   Comments: Mask in place °Eyes:  °   General:     °   Right eye: No discharge.     °   Left eye: No discharge.  °   Conjunctiva/sclera: Conjunctivae normal.  °Cardiovascular:  °   Rate and Rhythm: Normal rate and regular rhythm.  °   Pulses: Normal pulses.  °   Heart sounds: Normal heart sounds.  °Pulmonary:  °   Effort: Pulmonary effort is normal.  °   Breath sounds: Normal breath sounds.  °Musculoskeletal:     °   General: Normal range of motion.  °   Cervical back: Normal range of motion and neck supple.  °   Right lower leg: Edema present.  °   Left lower leg: Edema present.  °Skin: °   General: Skin is warm.  °Neurological:  °   General: No focal deficit present.  °   Mental Status: She is alert and oriented to person, place, and time.  °Psychiatric:     °   Attention and Perception: Attention normal.     °   Mood and Affect: Mood normal.     °   Speech: Speech normal.     °   Behavior: Behavior normal. Behavior is cooperative.     °   Thought Content: Thought content normal.     °   Cognition and Memory: Cognition normal.     °   Judgment: Judgment normal.  ° ° ° ° ° ° ° ° °Assessment  ° °1. Dysuria   °2. Morbid obesity (HCC)   °3. Uncontrolled type 2 diabetes mellitus with hyperglycemia, with long-term current use of insulin (HCC)   °4. Need for immunization against influenza   °5.  Irregular periods/menstrual cycles   °6. Essential hypertension, benign   ° ° °Tests ordered °Orders Placed This Encounter  °Procedures  °• Urine Culture  °• Flu Vaccine QUAD   Flu Vaccine QUAD 36+ mos IM   CBC   Comprehensive metabolic panel   Hemoglobin A1c   TSH   Ambulatory referral to Obstetrics / Gynecology   POCT Urinalysis Dipstick     Plan: Please see assessment and plan per problem list above.   Meds ordered this encounter  Medications   nitrofurantoin, macrocrystal-monohydrate, (MACROBID) 100 MG capsule    Sig: Take 1 capsule (100 mg total) by mouth 2 (two) times daily for 5 days.    Dispense:  10 capsule    Refill:  0    Order Specific Question:   Supervising Provider    Answer:   Jacklynn Bue    Patient to follow-up in 10/31/2020   Note: This dictation was prepared with Dragon dictation along with smaller phrase technology. Similar sounding words can be transcribed inadequately or may not be corrected upon review. Any transcriptional errors that result from this process are unintentional.      Perlie Mayo, NP

## 2020-10-04 ENCOUNTER — Other Ambulatory Visit: Payer: Self-pay | Admitting: Family Medicine

## 2020-10-04 DIAGNOSIS — N926 Irregular menstruation, unspecified: Secondary | ICD-10-CM | POA: Insufficient documentation

## 2020-10-04 DIAGNOSIS — R300 Dysuria: Secondary | ICD-10-CM | POA: Insufficient documentation

## 2020-10-04 DIAGNOSIS — Z23 Encounter for immunization: Secondary | ICD-10-CM | POA: Insufficient documentation

## 2020-10-04 DIAGNOSIS — E785 Hyperlipidemia, unspecified: Secondary | ICD-10-CM

## 2020-10-04 NOTE — Assessment & Plan Note (Signed)
Possible need to increase and adjust medications.  Will be getting updated labs and will adjust as necessary.  Higher end of normal for blood pressure.  But she is reporting that she feels like she is holding the fluid.  Possible need of adjustment of diuretic.  DASH diet and exercise are encouraged.

## 2020-10-04 NOTE — Assessment & Plan Note (Signed)
Obesity is linked to diabetes,, hypertension, hyperlipidemia  It has deteriorated over the last several months.  She thinks is fluid related.  Will be getting updated labs and adjust the medications as needed.  With close follow-up on weight management.

## 2020-10-04 NOTE — Assessment & Plan Note (Signed)

## 2020-10-04 NOTE — Assessment & Plan Note (Signed)
Positive dip in office.  Will do treatment and follow-up for culture if need of treatment change will provide.

## 2020-10-04 NOTE — Assessment & Plan Note (Signed)
Updated labs are ordered.  Positive for urinary tract infection in the office.  Possibly related to uncontrolled diabetes.  Will adjust medications pending lab values.

## 2020-10-04 NOTE — Assessment & Plan Note (Signed)
Irregular cycles.  Has been going off and on for a while now.  Referral to [facility] for follow-up.

## 2020-10-05 ENCOUNTER — Telehealth: Payer: Self-pay

## 2020-10-05 NOTE — Telephone Encounter (Signed)
Noted thank you

## 2020-10-05 NOTE — Telephone Encounter (Signed)
Patient wanted to let you know that she has an outstanding bill with [facility] and they will not do her labs until she pays on this. She is in the process of getting her new card sent to them so that they will pay on that bill and then she will go forward with her labs you were wanting. FYI

## 2020-10-06 ENCOUNTER — Other Ambulatory Visit (HOSPITAL_COMMUNITY)
Admission: RE | Admit: 2020-10-06 | Discharge: 2020-10-06 | Disposition: A | Discharge status: Home / Self Care | Payer: Medicare HMO | Source: Other Acute Inpatient Hospital | Attending: Family Medicine | Admitting: Family Medicine

## 2020-10-06 DIAGNOSIS — R300 Dysuria: Secondary | ICD-10-CM | POA: Diagnosis not present

## 2020-10-09 LAB — URINE CULTURE: Culture: >=100000 — AB

## 2020-10-11 ENCOUNTER — Other Ambulatory Visit: Payer: Self-pay

## 2020-10-11 DIAGNOSIS — E785 Hyperlipidemia, unspecified: Secondary | ICD-10-CM

## 2020-10-11 MED ORDER — ATORVASTATIN CALCIUM 20 MG PO TABS: 20.0000 mg | ORAL_TABLET | Freq: Every day | ORAL | 1 refills | Status: DC

## 2020-10-17 DIAGNOSIS — E113523 Type 2 diabetes mellitus with proliferative diabetic retinopathy with traction retinal detachment involving the macula, bilateral: Secondary | ICD-10-CM | POA: Diagnosis not present

## 2020-10-31 ENCOUNTER — Ambulatory Visit: Payer: Medicare HMO | Admitting: Family Medicine

## 2020-11-02 ENCOUNTER — Encounter: Payer: Medicare HMO | Admitting: Family Medicine

## 2020-11-10 ENCOUNTER — Other Ambulatory Visit: Payer: Self-pay

## 2020-11-10 ENCOUNTER — Other Ambulatory Visit: Payer: Self-pay | Admitting: Family Medicine

## 2020-11-10 ENCOUNTER — Encounter: Payer: Self-pay | Admitting: Family Medicine

## 2020-11-10 ENCOUNTER — Ambulatory Visit: Payer: Medicare HMO | Admitting: Family Medicine

## 2020-11-10 ENCOUNTER — Ambulatory Visit (INDEPENDENT_AMBULATORY_CARE_PROVIDER_SITE_OTHER): Payer: Medicare HMO | Admitting: Family Medicine

## 2020-11-10 VITALS — BP 144/88 | HR 102 | Temp 97.6°F | Ht 67.0 in | Wt >= 6400 oz

## 2020-11-10 DIAGNOSIS — E559 Vitamin D deficiency, unspecified: Secondary | ICD-10-CM

## 2020-11-10 DIAGNOSIS — L680 Hirsutism: Secondary | ICD-10-CM

## 2020-11-10 DIAGNOSIS — I10 Essential (primary) hypertension: Secondary | ICD-10-CM

## 2020-11-10 DIAGNOSIS — Z0000 Encounter for general adult medical examination without abnormal findings: Secondary | ICD-10-CM | POA: Diagnosis not present

## 2020-11-10 DIAGNOSIS — Z794 Long term (current) use of insulin: Secondary | ICD-10-CM

## 2020-11-10 DIAGNOSIS — E1165 Type 2 diabetes mellitus with hyperglycemia: Secondary | ICD-10-CM

## 2020-11-10 MED ORDER — SPIRONOLACTONE 50 MG PO TABS: 50.0000 mg | ORAL_TABLET | Freq: Two times a day (BID) | ORAL | 1 refills | Status: DC

## 2020-11-10 NOTE — Progress Notes (Addendum)
Subjective:    Rachel George is a 39 y.o. female who presents for a Welcome to Medicare exam.   Review of Systems Cardiac Risk Factors include: obesity (BMI >30kg/m2)      Objective:    Today's Vitals   11/10/20 1056 11/10/20 1102  BP: (!) 144/88   Pulse: (!) 102   Temp: 97.6 F (36.4 C)   TempSrc: Temporal   SpO2: 98%   Weight: (!) 428 lb (194.1 kg)   Height: $Remove'5\' 7"'kEsUDyM$  (1.702 m)   PainSc: 0-No pain 0-No pain  Body mass index is 67.03 kg/m.  Medications Outpatient Encounter Medications as of 11/10/2020  Medication Sig  . atorvastatin (LIPITOR) 20 MG tablet Take 1 tablet (20 mg total) by mouth daily at 6 PM.  . blood glucose meter kit and supplies Dispense based on patient and insurance preference. Use to test blood glucose 4 times daily. (FOR ICD-10 E10.9, E11.9).  Marland Kitchen diclofenac Sodium (VOLTAREN) 1 % GEL Apply 2 g topically 3 (three) times daily as needed.  . famotidine (PEPCID) 20 MG tablet Take 1 tablet (20 mg total) by mouth 2 (two) times daily.  . ferrous sulfate 324 MG TBEC Take 65 mg by mouth.  . gabapentin (NEURONTIN) 300 MG capsule Take 1 capsule (300 mg total) by mouth 2 (two) times daily. For pain  . insulin aspart (NOVOLOG) 100 UNIT/ML injection Inject 14-20 Units into the skin 3 (three) times daily before meals.  . insulin detemir (LEVEMIR) 100 UNIT/ML injection Inject 0.6 mLs (60 Units total) into the skin at bedtime.  Marland Kitchen lisinopril (ZESTRIL) 10 MG tablet Take 1 tablet (10 mg total) by mouth daily.  . Vitamin D, Ergocalciferol, (DRISDOL) 1.25 MG (50000 UNIT) CAPS capsule Take 1 capsule (50,000 Units total) by mouth every 7 (seven) days.  . [DISCONTINUED] spironolactone (ALDACTONE) 50 MG tablet Take 1 tablet (50 mg total) by mouth 2 (two) times daily.  Marland Kitchen spironolactone (ALDACTONE) 50 MG tablet Take 1 tablet (50 mg total) by mouth 2 (two) times daily.   No facility-administered encounter medications on file as of 11/10/2020.     History: Past Medical  History:  Diagnosis Date  . Anemia   . Cataract   . Cellulitis of toe of right foot 10/07/2016  . Depression   . Diabetes mellitus without complication (HCC)    dx age 17  . Diabetic foot ulcer ([place]) 10/15/2016  . Fatty liver disease, nonalcoholic   . Glaucoma   . Hyperlipidemia   . Hypertension   . Macular degeneration   . Morbid obesity with BMI of 50.0-59.9, adult ([place]) 05/01/2017  . Neuropathy   . Obesity, Class III, BMI 40-49.9 (morbid obesity) ([place]) 05/01/2017  . 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 Osteomyelitis ([place]) 12/13/2016  . Partial blindness   . 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 Suicidal ideation 11/04/2017   Past Surgical History:  Procedure Laterality Date  . AMPUTATION TOE Right 12/16/2016   Procedure: RIGHT FIRST TOE AMPUTATION;  Surgeon: Vickie Epley, MD;  Location: [facility];  Service: General;  Laterality: Right;  Right first toe amputation for osteomyelitis  . eye lid transplant    . RETINAL LASER PROCEDURE      Family History  Problem Relation Age of Onset  . Diabetes Mother   . Thyroid disease Mother   . Diabetes Father    Social History   Occupational History  . Occupation: CNA personal care giver  Tobacco Use  .  Smoking status: Former Smoker    Packs/day: 0.25    Types: Cigarettes    Quit date: 12/11/2016    Years since quitting: 3.9  . Smokeless tobacco: Never Used  Vaping Use  . Vaping Use: Never used  Substance and Sexual Activity  . Alcohol use: No  . Drug use: No  . Sexual activity: Yes    Partners: Male    Birth control/protection: None    Tobacco Counseling Counseling given: Yes   Immunizations and Health Maintenance Immunization History  Administered Date(s) Administered  . Influenza,inj,Quad PF,6+ Mos 12/14/2016, 09/11/2017, 12/08/2018, 10/03/2020  . Influenza-Unspecified 09/01/2017  . Moderna SARS-COVID-2 Vaccination 04/04/2020, 05/02/2020   Health Maintenance Due  Topic Date Due  . Hepatitis C  Screening  Never done  . PNEUMOCOCCAL POLYSACCHARIDE VACCINE AGE 75-64 HIGH RISK  Never done  . TETANUS/TDAP  Never done  . PAP SMEAR-Modifier  Never done  . FOOT EXAM  09/09/2017    Activities of Daily Living In your present state of health, do you have any difficulty performing the following activities: 11/10/2020 06/20/2020  Hearing? N N  Vision? Y N  Difficulty concentrating or making decisions? N N  Walking or climbing stairs? N Y  Dressing or bathing? N N  Doing errands, shopping? N N  Preparing Food and eating ? N -  Using the Toilet? N -  In the past six months, have you accidently leaked urine? N -  Do you have problems with loss of bowel control? N -  Managing your Medications? N -  Managing your Finances? N -  Housekeeping or managing your Housekeeping? N -  Some recent data might be hidden    Physical Exam (optional), or other factors deemed appropriate based on the beneficiary's medical and social history and current clinical standards.  Physical Exam Vitals and nursing note reviewed.  Constitutional:      Appearance: Normal appearance. She is well-developed and well-groomed. She is morbidly obese.  HENT:     Head: Normocephalic and atraumatic.     Right Ear: External ear normal.     Left Ear: External ear normal.     Mouth/Throat:     Comments: Mask in place  Eyes:     General:        Right eye: No discharge.        Left eye: No discharge.     Conjunctiva/sclera: Conjunctivae normal.  Cardiovascular:     Rate and Rhythm: Normal rate and regular rhythm.     Pulses: Normal pulses.     Heart sounds: Normal heart sounds.  Pulmonary:     Effort: Pulmonary effort is normal.     Breath sounds: Normal breath sounds.  Musculoskeletal:        General: Normal range of motion.     Cervical back: Normal range of motion and neck supple.  Skin:    General: Skin is warm.  Neurological:     General: No focal deficit present.     Mental Status: She is alert and  oriented to person, place, and time.  Psychiatric:        Attention and Perception: Attention normal.        Mood and Affect: Mood normal.        Speech: Speech normal.        Behavior: Behavior normal. Behavior is cooperative.        Thought Content: Thought content normal.        Cognition and Memory: Cognition  normal.        Judgment: Judgment normal.    EKG in office: NSR- 90 bpm, anterolateral infract, age undetermined, abnormal ecg- consistent with previous EKG in chart. - personally reviewed by provider  Advanced Directives: Does Patient Have a Medical Advance Directive?: No Would patient like information on creating a medical advance directive?: No - Patient declined    Assessment:    This is a routine wellness examination for this patient .   Vision/Hearing screen -recent eye sx vision not checked No exam data present  Dietary issues and exercise activities discussed:  Current Exercise Habits: Home exercise routine, Type of exercise: walking, Time (Minutes): 30, Frequency (Times/Week): 3, Weekly Exercise (Minutes/Week): 90, Intensity: Mild, Exercise limited by: orthopedic condition(s)  Goals    . Weight (lb) < 200 lb (90.7 kg)     Wants to lose 75 lbs.      Depression Screen PHQ 2/9 Scores 11/10/2020 10/03/2020 08/08/2020 07/11/2020  PHQ - 2 Score 2 0 0 0  PHQ- 9 Score 8 2 0 -     Fall Risk Fall Risk  11/10/2020  Falls in the past year? 0  Number falls in past yr: 0  Injury with Fall? 0  Risk for fall due to : No Fall Risks  Follow up Falls evaluation completed    Cognitive Function:     6CIT Screen 11/10/2020  What Year? 0 points  What month? 0 points  What time? 0 points  Count back from 20 0 points  Months in reverse 0 points  Repeat phrase 0 points  Total Score 0    Patient Care Team: Perlie Mayo, NP as PCP - General (Family Medicine)     Plan:     1. Encounter for Medicare annual wellness exam   I have personally reviewed and noted  the following in the patient's chart:   . Medical and social history . Use of alcohol, tobacco or illicit drugs  . Current medications and supplements . Functional ability and status . Nutritional status . Physical activity . Advanced directives . List of other physicians . Hospitalizations, surgeries, and ER visits in previous 12 months . Vitals . Screenings to include cognitive, depression, and falls . Referrals and appointments  In addition, I have reviewed and discussed with patient certain preventive protocols, quality metrics, and best practice recommendations. A written personalized care plan for preventive services as well as general preventive health recommendations were provided to patient.     Perlie Mayo, NP 11/10/2020

## 2020-11-10 NOTE — Patient Instructions (Addendum)
Ms. Bowditch , Thank you for taking time to come for your Medicare Wellness Visit. I appreciate your ongoing commitment to your health goals. Please review the following plan we discussed and let me know if I can assist you in the future.   Merry Christmas and Happy New Year!  Please continue to practice social distancing to keep you, your family, and our community safe.  If you must go out, please wear a Mask and practice good handwashing.  Screening recommendations/referrals: Colonoscopy: Due at 8  Mammogram: Ordered Bone Density: Due at 39  Recommended yearly ophthalmology/optometry visit for glaucoma screening and checkup Recommended yearly dental visit for hygiene and checkup  Vaccinations: Influenza vaccine: Completed Due again Fall 2022 Pneumococcal vaccine: Had one in 2018- repeat in 5 years  Tdap vaccine: Completed in 2013 Shingles vaccine: N/A    Advanced directives: Please give copy of information today  Conditions/risks identified: Weight and Blood pressure   Next appointment: 01/10/2021   Preventive Care 40-64 Years, Female Preventive care refers to lifestyle choices and visits with your health care provider that can promote health and wellness. What does preventive care include?  A yearly physical exam. This is also called an annual well check.  Dental exams once or twice a year.  Routine eye exams. Ask your health care provider how often you should have your eyes checked.  Personal lifestyle choices, including:  Daily care of your teeth and gums.  Regular physical activity.  Eating a healthy diet.  Avoiding tobacco and drug use.  Limiting alcohol use.  Practicing safe sex.  Taking low-dose aspirin daily starting at age 40.  Taking vitamin and mineral supplements as recommended by your health care provider. What happens during an annual well check? The services and screenings done by your health care provider during your annual well check will  depend on your age, overall health, lifestyle risk factors, and family history of disease. Counseling  Your health care provider may ask you questions about your:  Alcohol use.  Tobacco use.  Drug use.  Emotional well-being.  Home and relationship well-being.  Sexual activity.  Eating habits.  Work and work Statistician.  Method of birth control.  Menstrual cycle.  Pregnancy history. Screening  You may have the following tests or measurements:  Height, weight, and BMI.  Blood pressure.  Lipid and cholesterol levels. These may be checked every 5 years, or more frequently if you are over 29 years old.  Skin check.  Lung cancer screening. You may have this screening every year starting at age 35 if you have a 30-pack-year history of smoking and currently smoke or have quit within the past 15 years.  Fecal occult blood test (FOBT) of the stool. You may have this test every year starting at age 51.  Flexible sigmoidoscopy or colonoscopy. You may have a sigmoidoscopy every 5 years or a colonoscopy every 10 years starting at age 68.  Hepatitis C blood test.  Hepatitis B blood test.  Sexually transmitted disease (STD) testing.  Diabetes screening. This is done by checking your blood sugar (glucose) after you have not eaten for a while (fasting). You may have this done every 1-3 years.  Mammogram. This may be done every 1-2 years. Talk to your health care provider about when you should start having regular mammograms. This may depend on whether you have a family history of breast cancer.  BRCA-related cancer screening. This may be done if you have a family history of breast, ovarian, tubal,  or peritoneal cancers.  Pelvic exam and Pap test. This may be done every 3 years starting at age 40. Starting at age 40, this may be done every 5 years if you have a Pap test in combination with an HPV test.  Bone density scan. This is done to screen for osteoporosis. You may have  this scan if you are at high risk for osteoporosis. Discuss your test results, treatment options, and if necessary, the need for more tests with your health care provider. Vaccines  Your health care provider may recommend certain vaccines, such as:  Influenza vaccine. This is recommended every year.  Tetanus, diphtheria, and acellular pertussis (Tdap, Td) vaccine. You may need a Td booster every 10 years.  Zoster vaccine. You may need this after age 40.  Pneumococcal 13-valent conjugate (PCV13) vaccine. You may need this if you have certain conditions and were not previously vaccinated.  Pneumococcal polysaccharide (PPSV23) vaccine. You may need one or two doses if you smoke cigarettes or if you have certain conditions. Talk to your health care provider about which screenings and vaccines you need and how often you need them. This information is not intended to replace advice given to you by your health care provider. Make sure you discuss any questions you have with your health care provider. Document Released: 12/15/2015 Document Revised: 08/07/2016 Document Reviewed: 09/19/2015 Elsevier Interactive Patient Education  2017 [employer] Prevention in the Home Falls can cause injuries. They can happen to people of all ages. There are many things you can do to make your home safe and to help prevent falls. What can I do on the outside of my home?  Regularly fix the edges of walkways and driveways and fix any cracks.  Remove anything that might make you trip as you walk through a door, such as a raised step or threshold.  Trim any bushes or trees on the path to your home.  Use bright outdoor lighting.  Clear any walking paths of anything that might make someone trip, such as rocks or tools.  Regularly check to see if handrails are loose or broken. Make sure that both sides of any steps have handrails.  Any raised decks and porches should have guardrails on the  edges.  Have any leaves, snow, or ice cleared regularly.  Use sand or salt on walking paths during winter.  Clean up any spills in your garage right away. This includes oil or grease spills. What can I do in the bathroom?  Use night lights.  Install grab bars by the toilet and in the tub and shower. Do not use towel bars as grab bars.  Use non-skid mats or decals in the tub or shower.  If you need to sit down in the shower, use a plastic, non-slip stool.  Keep the floor dry. Clean up any water that spills on the floor as soon as it happens.  Remove soap buildup in the tub or shower regularly.  Attach bath mats securely with double-sided non-slip rug tape.  Do not have throw rugs and other things on the floor that can make you trip. What can I do in the bedroom?  Use night lights.  Make sure that you have a light by your bed that is easy to reach.  Do not use any sheets or blankets that are too big for your bed. They should not hang down onto the floor.  Have a firm chair that has side arms. You  can use this for support while you get dressed.  Do not have throw rugs and other things on the floor that can make you trip. What can I do in the kitchen?  Clean up any spills right away.  Avoid walking on wet floors.  Keep items that you use a lot in easy-to-reach places.  If you need to reach something above you, use a strong step stool that has a grab bar.  Keep electrical cords out of the way.  Do not use floor polish or wax that makes floors slippery. If you must use wax, use non-skid floor wax.  Do not have throw rugs and other things on the floor that can make you trip. What can I do with my stairs?  Do not leave any items on the stairs.  Make sure that there are handrails on both sides of the stairs and use them. Fix handrails that are broken or loose. Make sure that handrails are as long as the stairways.  Check any carpeting to make sure that it is firmly  attached to the stairs. Fix any carpet that is loose or worn.  Avoid having throw rugs at the top or bottom of the stairs. If you do have throw rugs, attach them to the floor with carpet tape.  Make sure that you have a light switch at the top of the stairs and the bottom of the stairs. If you do not have them, ask someone to add them for you. What else can I do to help prevent falls?  Wear shoes that:  Do not have high heels.  Have rubber bottoms.  Are comfortable and fit you well.  Are closed at the toe. Do not wear sandals.  If you use a stepladder:  Make sure that it is fully opened. Do not climb a closed stepladder.  Make sure that both sides of the stepladder are locked into place.  Ask someone to hold it for you, if possible.  Clearly mark and make sure that you can see:  Any grab bars or handrails.  First and last steps.  Where the edge of each step is.  Use tools that help you move around (mobility aids) if they are needed. These include:  Canes.  Walkers.  Scooters.  Crutches.  Turn on the lights when you go into a dark area. Replace any light bulbs as soon as they burn out.  Set up your furniture so you have a clear path. Avoid moving your furniture around.  If any of your floors are uneven, fix them.  If there are any pets around you, be aware of where they are.  Review your medicines with your doctor. Some medicines can make you feel dizzy. This can increase your chance of falling. Ask your doctor what other things that you can do to help prevent falls. This information is not intended to replace advice given to you by your health care provider. Make sure you discuss any questions you have with your health care provider. Document Released: 09/14/2009 Document Revised: 04/25/2016 Document Reviewed: 12/23/2014 Elsevier Interactive Patient Education  2017 [employer].

## 2020-11-14 ENCOUNTER — Encounter: Payer: Medicare HMO | Admitting: Adult Health

## 2020-11-28 DIAGNOSIS — E113523 Type 2 diabetes mellitus with proliferative diabetic retinopathy with traction retinal detachment involving the macula, bilateral: Secondary | ICD-10-CM | POA: Diagnosis not present

## 2020-12-06 ENCOUNTER — Encounter: Payer: Medicare HMO | Admitting: Adult Health

## 2020-12-20 ENCOUNTER — Encounter: Payer: Medicare HMO | Admitting: Adult Health

## 2021-01-05 DIAGNOSIS — Z20828 Contact with and (suspected) exposure to other viral communicable diseases: Secondary | ICD-10-CM | POA: Diagnosis not present

## 2021-01-10 ENCOUNTER — Ambulatory Visit: Payer: Medicare HMO | Admitting: Family Medicine

## 2021-01-11 ENCOUNTER — Encounter: Payer: Medicare HMO | Admitting: Adult Health

## 2021-01-16 ENCOUNTER — Encounter: Payer: Self-pay | Admitting: Internal Medicine

## 2021-01-16 ENCOUNTER — Other Ambulatory Visit: Payer: Self-pay

## 2021-01-16 ENCOUNTER — Telehealth (INDEPENDENT_AMBULATORY_CARE_PROVIDER_SITE_OTHER): Payer: Medicare HMO | Admitting: Internal Medicine

## 2021-01-16 DIAGNOSIS — R053 Chronic cough: Secondary | ICD-10-CM

## 2021-01-16 DIAGNOSIS — U099 Post covid-19 condition, unspecified: Secondary | ICD-10-CM | POA: Diagnosis not present

## 2021-01-16 DIAGNOSIS — R195 Other fecal abnormalities: Secondary | ICD-10-CM | POA: Diagnosis not present

## 2021-01-16 DIAGNOSIS — K219 Gastro-esophageal reflux disease without esophagitis: Secondary | ICD-10-CM | POA: Diagnosis not present

## 2021-01-16 MED ORDER — LOPERAMIDE HCL 2 MG PO CAPS: 2.0000 mg | ORAL_CAPSULE | Freq: Four times a day (QID) | ORAL | 0 refills | Status: DC | PRN

## 2021-01-16 MED ORDER — BENZONATATE 100 MG PO CAPS: 100.0000 mg | ORAL_CAPSULE | Freq: Two times a day (BID) | ORAL | 0 refills | Status: DC | PRN

## 2021-01-16 MED ORDER — BACID PO CAPS: 1.0000 | ORAL_CAPSULE | Freq: Two times a day (BID) | ORAL | 0 refills | Status: DC

## 2021-01-16 MED ORDER — FAMOTIDINE 20 MG PO TABS: 20.0000 mg | ORAL_TABLET | Freq: Two times a day (BID) | ORAL | 2 refills | Status: DC

## 2021-01-16 NOTE — Patient Instructions (Signed)
Please start taking medications as prescribed.  Please avoid hot and spicy food.  Okay to use nasal saline spray for nasal congestion.  If you have watery diarrhea or develop new symptoms such as blood in stool, fever, chills or sudden abdominal pain, please get immediate medical attention.

## 2021-01-16 NOTE — Progress Notes (Addendum)
Virtual Visit via Telephone Note   This visit type was conducted due to national recommendations for restrictions regarding the COVID-19 Pandemic (e.g. social distancing) in an effort to limit this patient's exposure and mitigate transmission in our community.  Due to her co-morbid illnesses, this patient is at least at moderate risk for complications without adequate follow up.  This format is felt to be most appropriate for this patient at this time.  The patient did not have access to video technology/had technical difficulties with video requiring transitioning to audio format only (telephone).  All issues noted in this document were discussed and addressed.  No physical exam could be performed with this format.  Evaluation Performed:  Follow-up visit  Date:  01/16/2021   ID:  Bibiana, Gillean 1981/03/31, MRN [id number]  Patient Location: Home Provider Location: Office/Clinic  Participants: Patient Location of Patient: Home Location of Provider: Telehealth Consent was obtain for visit to be over via telehealth. I verified that I am speaking with the correct person using two identifiers.  PCP:  Perlie Mayo, NP   Chief Complaint:  Cough, loose BM  History of Present Illness:    Lakendra Helling is a 40 y.o. female who has a televisit for c/o dry cough and nasal congestion since she had COVID about a month ago. She denies any fever, chills, sore throat or sinus pain/pressure. She also has been having loose BM, about 3-4 times/day. She denies any hematochezia. Reports having dark stools, but she takes iron supplements which could contribute to it. She has run out of Pepcid, which she was prescribed for GERD.  The patient does not have symptoms concerning for COVID-19 infection (fever, chills, cough, or new shortness of breath).   Past Medical, Surgical, Social History, Allergies, and Medications have been Reviewed.  Past Medical History:  Diagnosis Date  .  Anemia   . Cataract   . Cellulitis of toe of right foot 10/07/2016  . Depression   . Diabetes mellitus without complication (HCC)    dx age 29  . Diabetic foot ulcer ([place]) 10/15/2016  . Fatty liver disease, nonalcoholic   . Glaucoma   . Hyperlipidemia   . Hypertension   . Macular degeneration   . Morbid obesity with BMI of 50.0-59.9, adult ([place]) 05/01/2017  . Neuropathy   . Obesity, Class III, BMI 40-49.9 (morbid obesity) ([place]) 05/01/2017  . 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 Osteomyelitis ([place]) 12/13/2016  . Partial blindness   . 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 Suicidal ideation 11/04/2017   Past Surgical History:  Procedure Laterality Date  . AMPUTATION TOE Right 12/16/2016   Procedure: RIGHT FIRST TOE AMPUTATION;  Surgeon: Vickie Epley, MD;  Location: [facility];  Service: General;  Laterality: Right;  Right first toe amputation for osteomyelitis  . eye lid transplant    . RETINAL LASER PROCEDURE       Current Meds  Medication Sig  . atorvastatin (LIPITOR) 20 MG tablet Take 1 tablet (20 mg total) by mouth daily at 6 PM.  . benzonatate (TESSALON) 100 MG capsule Take 1 capsule (100 mg total) by mouth 2 (two) times daily as needed for cough.  . blood glucose meter kit and supplies Dispense based on patient and insurance preference. Use to test blood glucose 4 times daily. (FOR ICD-10 E10.9, E11.9).  Marland Kitchen diclofenac Sodium (VOLTAREN) 1 % GEL Apply 2 g topically 3 (three) times daily as needed.  Marland Kitchen  ferrous sulfate 324 MG TBEC Take 65 mg by mouth.  . gabapentin (NEURONTIN) 300 MG capsule Take 1 capsule (300 mg total) by mouth 2 (two) times daily. For pain  . insulin aspart (NOVOLOG) 100 UNIT/ML injection Inject 14-20 Units into the skin 3 (three) times daily before meals.  . insulin detemir (LEVEMIR) 100 UNIT/ML injection Inject 0.6 mLs (60 Units total) into the skin at bedtime.  Marland Kitchen lisinopril (ZESTRIL) 10 MG tablet Take 1 tablet (10 mg total) by mouth daily.  Marland Kitchen  loperamide (IMODIUM) 2 MG capsule Take 1 capsule (2 mg total) by mouth every 6 (six) hours as needed for diarrhea or loose stools.  . Probiotic Product (BACID) CAPS Take 1 capsule by mouth in the morning and at bedtime.  Marland Kitchen spironolactone (ALDACTONE) 50 MG tablet Take 1 tablet (50 mg total) by mouth 2 (two) times daily.  . Vitamin D, Ergocalciferol, (DRISDOL) 1.25 MG (50000 UNIT) CAPS capsule Take 1 capsule (50,000 Units total) by mouth every 7 (seven) days.  . [DISCONTINUED] famotidine (PEPCID) 20 MG tablet Take 1 tablet by mouth twice daily  . [DISCONTINUED] famotidine (PEPCID) 20 MG tablet Take 1 tablet (20 mg total) by mouth 2 (two) times daily.     Allergies:   Other, Shellfish allergy, Oxycodone, and Percocet [oxycodone-acetaminophen]   ROS:   Please see the history of present illness.     All other systems reviewed and are negative.   Labs/Other Tests and Data Reviewed:    Recent Labs: 01/26/2020: TSH 0.88 07/13/2020: ALT 7; BUN 16; Creatinine, Ser 1.21; Hemoglobin 10.7; Platelets 206; Potassium 4.1; Sodium 140   Recent Lipid Panel Lab Results  Component Value Date/Time   CHOL 126 07/13/2020 10:09 AM   TRIG 87 07/13/2020 10:09 AM   HDL 54 07/13/2020 10:09 AM   CHOLHDL 2.3 07/13/2020 10:09 AM   CHOLHDL 3.9 01/26/2020 02:47 PM   LDLCALC 55 07/13/2020 10:09 AM   LDLCALC 134 (H) 01/26/2020 02:47 PM    Wt Readings from Last 3 Encounters:  11/10/20 (!) 428 lb (194.1 kg)  10/03/20 (!) 427 lb (193.7 kg)  08/08/20 (!) 401 lb (181.9 kg)      ASSESSMENT & PLAN:    Chronic 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   Medication Adjustments/Labs and Tests Ordered: Current medicines are reviewed at length with the patient today.  Concerns regarding medicines are outlined above.   Tests Ordered: No orders of the defined types were placed in this encounter.   Medication Changes: Meds ordered this encounter  Medications  . famotidine (PEPCID) 20 MG tablet    Sig: Take 1 tablet (20 mg total) by mouth 2 (two) times daily.    Dispense:  60 tablet    Refill:  2  . Probiotic Product (BACID) CAPS    Sig: Take 1 capsule by mouth in the morning and at bedtime.    Dispense:  30 capsule    Refill:  0  . loperamide (IMODIUM) 2 MG capsule    Sig:  Take 1 capsule (2 mg total) by mouth every 6 (six) hours as needed for diarrhea or loose stools.    Dispense:  30 capsule    Refill:  0  . benzonatate (TESSALON) 100 MG capsule    Sig: Take 1 capsule (100 mg total) by mouth 2 (two) times daily as needed for cough.    Dispense:  30 capsule    Refill:  0     Note: This dictation was prepared with Dragon dictation along with smaller phrase technology. Similar sounding words can be transcribed inadequately or may not be corrected upon review. Any transcriptional errors that result from this process are unintentional.      Disposition:  Follow up  Signed, Lindell Spar, MD  01/16/2021 11:39 AM     [place]

## 2021-01-23 DIAGNOSIS — E113523 Type 2 diabetes mellitus with proliferative diabetic retinopathy with traction retinal detachment involving the macula, bilateral: Secondary | ICD-10-CM | POA: Diagnosis not present

## 2021-01-23 DIAGNOSIS — H4051X2 Glaucoma secondary to other eye disorders, right eye, moderate stage: Secondary | ICD-10-CM | POA: Diagnosis not present

## 2021-01-30 ENCOUNTER — Other Ambulatory Visit: Payer: Self-pay

## 2021-01-30 ENCOUNTER — Encounter: Payer: Self-pay | Admitting: Internal Medicine

## 2021-01-30 ENCOUNTER — Ambulatory Visit (INDEPENDENT_AMBULATORY_CARE_PROVIDER_SITE_OTHER): Payer: Medicare HMO | Admitting: Internal Medicine

## 2021-01-30 VITALS — BP 151/83 | HR 93 | Resp 18 | Ht 67.0 in | Wt >= 6400 oz

## 2021-01-30 DIAGNOSIS — I10 Essential (primary) hypertension: Secondary | ICD-10-CM | POA: Diagnosis not present

## 2021-01-30 DIAGNOSIS — Z89411 Acquired absence of right great toe: Secondary | ICD-10-CM | POA: Diagnosis not present

## 2021-01-30 DIAGNOSIS — K219 Gastro-esophageal reflux disease without esophagitis: Secondary | ICD-10-CM

## 2021-01-30 DIAGNOSIS — E1139 Type 2 diabetes mellitus with other diabetic ophthalmic complication: Secondary | ICD-10-CM

## 2021-01-30 DIAGNOSIS — E1165 Type 2 diabetes mellitus with hyperglycemia: Secondary | ICD-10-CM | POA: Diagnosis not present

## 2021-01-30 DIAGNOSIS — E559 Vitamin D deficiency, unspecified: Secondary | ICD-10-CM

## 2021-01-30 DIAGNOSIS — G4733 Obstructive sleep apnea (adult) (pediatric): Secondary | ICD-10-CM | POA: Diagnosis not present

## 2021-01-30 DIAGNOSIS — R195 Other fecal abnormalities: Secondary | ICD-10-CM | POA: Diagnosis not present

## 2021-01-30 DIAGNOSIS — E1142 Type 2 diabetes mellitus with diabetic polyneuropathy: Secondary | ICD-10-CM

## 2021-01-30 DIAGNOSIS — IMO0002 Reserved for concepts with insufficient information to code with codable children: Secondary | ICD-10-CM

## 2021-01-30 LAB — POCT GLYCOSYLATED HEMOGLOBIN (HGB A1C): HbA1c, POC (controlled diabetic range): 9.3 % — AB (ref 0.0–7.0)

## 2021-01-30 MED ORDER — LISINOPRIL-HYDROCHLOROTHIAZIDE 20-12.5 MG PO TABS: 1.0000 | ORAL_TABLET | Freq: Every day | ORAL | 0 refills | Status: DC

## 2021-01-30 MED ORDER — BACID PO CAPS: 1.0000 | ORAL_CAPSULE | Freq: Two times a day (BID) | ORAL | 2 refills | Status: AC

## 2021-01-30 MED ORDER — FAMOTIDINE 20 MG PO TABS: 20.0000 mg | ORAL_TABLET | Freq: Two times a day (BID) | ORAL | 2 refills | Status: DC

## 2021-01-30 MED ORDER — LOPERAMIDE HCL 2 MG PO CAPS: 2.0000 mg | ORAL_CAPSULE | Freq: Four times a day (QID) | ORAL | 0 refills | Status: DC | PRN

## 2021-01-30 NOTE — Assessment & Plan Note (Signed)
On Gabapentin 

## 2021-01-30 NOTE — Assessment & Plan Note (Addendum)
HbA1C: 9.1 today in office On Levemir 60 U QD and Novolog sliding scale Has not brought blood glucose log, states it stays around 110, with only few readings above 200 Saw Dr Fransico Him in the past, referral sent again Advised to follow diabetic diet On ACE and statin F/u BMP and lipid panel Diabetic eye exam: Advised to follow up with Ophthalmology for diabetic eye exam

## 2021-01-30 NOTE — Progress Notes (Signed)
am

## 2021-01-30 NOTE — Patient Instructions (Signed)
Please start taking Lisinopril-HCTZ instead of Lisinopril.  Please continue taking medications as prescribed.  Please schedule an appointment with Dr Fransico Him for discussing insulin regimen.  You are being referred to Pulmonology for sleep study.

## 2021-01-30 NOTE — Progress Notes (Signed)
Established Patient Office Visit  Subjective:  Patient ID: Rachel George, female    DOB: 01-21-1981  Age: 40 y.o. MRN: [id number]  CC:  Chief Complaint  Patient presents with  . Follow-up    A1C check and blood work pt is fasting today also having problems with stomach it has been hurting this morning     HPI Rachel George presents for follow up of her chronic medical conditions.  She has been having epigastric pain, worse after eating. She was prescribed Pepcid, which she has not refilled. Denies any melena or hematochezia. She has h/o chronic loose BM, for which she was prescribed Bacid, but could not afford to take it at that time. She is willing to take it now.  DM: Her HbA1C was 9.3 today. She has been taking insulin regularly and has been trying to follow diabetic diet. She has not brought her blood glucose log.  HTN: BP is uncontrolled. Takes medications regularly. Patient denies headache, dizziness, chest pain, dyspnea or palpitations.  OSA: She had sleep study in the past, but did not hear back from the ordering provider. She had been noticed to have low O2 sat at night when she was in the hospital. She reports nighttime awakenings and daytime fatigue.  Past Medical History:  Diagnosis Date  . Anemia   . Cataract   . Cellulitis of toe of right foot 10/07/2016  . Depression   . Diabetes mellitus without complication (HCC)    dx age 86  . Diabetic foot ulcer ([place]) 10/15/2016  . Fatty liver disease, nonalcoholic   . Glaucoma   . Hyperlipidemia   . Hypertension   . Macular degeneration   . Morbid obesity with BMI of 50.0-59.9, adult ([place]) 05/01/2017  . Neuropathy   . Obesity, Class III, BMI 40-49.9 (morbid obesity) ([place]) 05/01/2017  . 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 Osteomyelitis ([place]) 12/13/2016  . Partial blindness   . 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 Suicidal ideation 11/04/2017    Past Surgical History:  Procedure  Laterality Date  . AMPUTATION TOE Right 12/16/2016   Procedure: RIGHT FIRST TOE AMPUTATION;  Surgeon: Vickie Epley, MD;  Location: [facility];  Service: General;  Laterality: Right;  Right first toe amputation for osteomyelitis  . eye lid transplant    . RETINAL LASER PROCEDURE      Family History  Problem Relation Age of Onset  . Diabetes Mother   . Thyroid disease Mother   . Diabetes Father     Social History   Socioeconomic History  . Marital status: Legally Separated    Spouse name: Rickayla Wieland  . Number of children: 0  . Years of education: Not on file  . Highest education level: High school graduate  Occupational History  . Occupation: CNA personal care giver  Tobacco Use  . Smoking status: Former Smoker    Packs/day: 0.25    Types: Cigarettes    Quit date: 12/11/2016    Years since quitting: 4.1  . Smokeless tobacco: Never Used  Vaping Use  . Vaping Use: Never used  Substance and Sexual Activity  . Alcohol use: No  . Drug use: No  . Sexual activity: Yes    Partners: Male    Birth control/protection: None  Other Topics Concern  . Not on file  Social History Narrative   Separated from husband-married for 7 years      Lives alone right now, in  a disability apartment.   Right handed      Enjoys walking       Diet: eat one meal a day- veggies, meats, low carb    Caffeine: tea-4 cups daily    Water: 4-6 bottles of water      Wears seat belt   Smoke detectors    Does not use phone while driving   Social Determinants of Health   Financial Resource Strain: Medium Risk  . Difficulty of Paying Living Expenses: Somewhat hard  Food Insecurity: No Food Insecurity  . Worried About Charity fundraiser in the Last Year: Never true  . Ran Out of Food in the Last Year: Never true  Transportation Needs: No Transportation Needs  . Lack of Transportation (Medical): No  . Lack of Transportation (Non-Medical): No  Physical Activity: Insufficiently Active  . Days  of Exercise per Week: 2 days  . Minutes of Exercise per Session: 30 min  Stress: No Stress Concern Present  . Feeling of Stress : Only a little  Social Connections: Socially Isolated  . Frequency of Communication with Friends and Family: Three times a week  . Frequency of Social Gatherings with Friends and Family: Twice a week  . Attends Religious Services: Never  . Active Member of Clubs or Organizations: No  . Attends Archivist Meetings: Never  . Marital Status: Never married  Intimate Partner Violence: Not At Risk  . Fear of Current or Ex-Partner: No  . Emotionally Abused: No  . Physically Abused: No  . Sexually Abused: No    Outpatient Medications Prior to Visit  Medication Sig Dispense Refill  . blood glucose meter kit and supplies Dispense based on patient and insurance preference. Use to test blood glucose 4 times daily. (FOR ICD-10 E10.9, E11.9). 1 each 0  . diclofenac Sodium (VOLTAREN) 1 % GEL Apply 2 g topically 3 (three) times daily as needed. 50 g 0  . ferrous sulfate 324 MG TBEC Take 65 mg by mouth.    . gabapentin (NEURONTIN) 300 MG capsule Take 1 capsule (300 mg total) by mouth 2 (two) times daily. For pain 60 capsule 0  . insulin aspart (NOVOLOG) 100 UNIT/ML injection Inject 14-20 Units into the skin 3 (three) times daily before meals. 20 mL 11  . insulin detemir (LEVEMIR) 100 UNIT/ML injection Inject 0.6 mLs (60 Units total) into the skin at bedtime. 10 mL 11  . spironolactone (ALDACTONE) 50 MG tablet Take 1 tablet (50 mg total) by mouth 2 (two) times daily. 60 tablet 1  . Vitamin D, Ergocalciferol, (DRISDOL) 1.25 MG (50000 UNIT) CAPS capsule Take 1 capsule (50,000 Units total) by mouth every 7 (seven) days. 12 capsule 0  . lisinopril (ZESTRIL) 10 MG tablet Take 1 tablet (10 mg total) by mouth daily. 90 tablet 3  . atorvastatin (LIPITOR) 20 MG tablet Take 1 tablet (20 mg total) by mouth daily at 6 PM. 90 tablet 1  . benzonatate (TESSALON) 100 MG capsule Take  1 capsule (100 mg total) by mouth 2 (two) times daily as needed for cough. (Patient not taking: Reported on 01/30/2021) 30 capsule 0  . famotidine (PEPCID) 20 MG tablet Take 1 tablet (20 mg total) by mouth 2 (two) times daily. (Patient not taking: Reported on 01/30/2021) 60 tablet 2  . loperamide (IMODIUM) 2 MG capsule Take 1 capsule (2 mg total) by mouth every 6 (six) hours as needed for diarrhea or loose stools. (Patient not taking: Reported on 01/30/2021) 30  capsule 0  . Probiotic Product (BACID) CAPS Take 1 capsule by mouth in the morning and at bedtime. (Patient not taking: Reported on 01/30/2021) 30 capsule 0   No facility-administered medications prior to visit.    Allergies  Allergen Reactions  . Other Anaphylaxis  . Shellfish Allergy Anaphylaxis, Shortness Of Breath and Swelling    Facial Swelling  . Oxycodone Nausea And Vomiting  . Percocet [Oxycodone-Acetaminophen] Itching and Nausea And Vomiting    ROS Review of Systems  Constitutional: Positive for fatigue. Negative for chills and fever.  HENT: Negative for congestion, sinus pressure, sinus pain and sore throat.   Eyes: Negative for pain and discharge.  Respiratory: Negative for cough and shortness of breath.   Cardiovascular: Negative for chest pain and palpitations.  Gastrointestinal: Positive for diarrhea. Negative for abdominal pain, constipation, nausea and vomiting.  Endocrine: Negative for polydipsia and polyuria.  Genitourinary: Negative for dysuria and hematuria.  Musculoskeletal: Negative for neck pain and neck stiffness.  Skin: Negative for rash.  Neurological: Negative for dizziness and weakness.  Psychiatric/Behavioral: Positive for sleep disturbance. Negative for agitation and behavioral problems.      Objective:    Physical Exam Vitals reviewed.  Constitutional:      General: She is not in acute distress.    Appearance: She is obese. She is not diaphoretic.  HENT:     Head: Normocephalic and atraumatic.      Nose: Nose normal. No congestion.     Mouth/Throat:     Mouth: Mucous membranes are moist.     Pharynx: No posterior oropharyngeal erythema.  Eyes:     General: No scleral icterus.    Extraocular Movements: Extraocular movements intact.     Pupils: Pupils are equal, round, and reactive to light.  Cardiovascular:     Rate and Rhythm: Normal rate and regular rhythm.     Pulses: Normal pulses.     Heart sounds: Normal heart sounds. No murmur heard.   Pulmonary:     Breath sounds: Normal breath sounds. No wheezing or rales.  Abdominal:     Palpations: Abdomen is soft.     Tenderness: There is no abdominal tenderness.  Musculoskeletal:     Cervical back: Neck supple. No tenderness.     Right lower leg: No edema.     Left lower leg: No edema.  Skin:    General: Skin is warm.     Findings: No rash.  Neurological:     General: No focal deficit present.     Mental Status: She is alert and oriented to person, place, and time.  Psychiatric:        Mood and Affect: Mood normal.        Behavior: Behavior normal.     BP (!) 151/83 (BP Location: Right Arm, Patient Position: Sitting)   Pulse 93   Resp 18   Ht 5\' 7"  (1.702 m)   Wt (!) 428 lb 6.4 oz (194.3 kg)   SpO2 97%   BMI 67.10 kg/m  Wt Readings from Last 3 Encounters:  01/30/21 (!) 428 lb 6.4 oz (194.3 kg)  11/10/20 (!) 428 lb (194.1 kg)  10/03/20 (!) 427 lb (193.7 kg)     Health Maintenance Due  Topic Date Due  . Hepatitis C Screening  Never done  . PNEUMOCOCCAL POLYSACCHARIDE VACCINE AGE 34-64 HIGH RISK  Never done  . TETANUS/TDAP  Never done  . PAP SMEAR-Modifier  Never done  . FOOT EXAM  09/09/2017  .  COVID-19 Vaccine (3 - Booster for Moderna series) 11/01/2020    There are no preventive care reminders to display for this patient.  Lab Results  Component Value Date   TSH 0.88 01/26/2020   Lab Results  Component Value Date   WBC 9.9 07/13/2020   HGB 10.7 (L) 07/13/2020   HCT 32.8 (L) 07/13/2020   MCV  77 (L) 07/13/2020   PLT 206 07/13/2020   Lab Results  Component Value Date   NA 140 07/13/2020   K 4.1 07/13/2020   CO2 20 07/13/2020   GLUCOSE 178 (H) 07/13/2020   BUN 16 07/13/2020   CREATININE 1.21 (H) 07/13/2020   BILITOT 0.3 07/13/2020   ALKPHOS 86 07/13/2020   AST 10 07/13/2020   ALT 7 07/13/2020   PROT 6.5 07/13/2020   ALBUMIN 3.5 (L) 07/13/2020   CALCIUM 8.5 (L) 07/13/2020   ANIONGAP 12 07/22/2019   Lab Results  Component Value Date   CHOL 126 07/13/2020   Lab Results  Component Value Date   HDL 54 07/13/2020   Lab Results  Component Value Date   LDLCALC 55 07/13/2020   Lab Results  Component Value Date   TRIG 87 07/13/2020   Lab Results  Component Value Date   CHOLHDL 2.3 07/13/2020   Lab Results  Component Value Date   HGBA1C 9.3 (A) 01/30/2021      Assessment & Plan:   Problem List Items Addressed This Visit      Cardiovascular and Mediastinum   Essential hypertension, benign    BP Readings from Last 1 Encounters:  01/30/21 (!) 151/83   uncontrolled with Lisinopril and Spironolactone Switched to [place] Could be a component of OSA/OHS, referred for sleep study Counseled for compliance with the medications Advised DASH diet and moderate exercise/walking, at least 150 mins/week       Relevant Medications   lisinopril-hydrochlorothiazide (ZESTORETIC) 20-12.5 MG tablet     Digestive   Gastroesophageal reflux disease Refilled Pepcid Some of GI symptoms could be due to diabetic gastropathy. If persistent symptoms, can give trial of Reglan.   Relevant Medications   famotidine (PEPCID) 20 MG tablet   Probiotic Product (BACID) CAPS   loperamide (IMODIUM) 2 MG capsule     Endocrine   Diabetic polyneuropathy associated with type 2 diabetes mellitus (HCC)    On Gabapentin      Relevant Medications   lisinopril-hydrochlorothiazide (ZESTORETIC) 20-12.5 MG tablet   DM (diabetes mellitus), type 2, uncontrolled w/ophthalmic  complication (HCC) - Primary    HbA1C: 9.1 today in office On Levemir 60 U QD and Novolog sliding scale Has not brought blood glucose log, states it stays around 110, with only few readings above 200 Advised to follow diabetic diet On ACE and statin F/u BMP and lipid panel Diabetic eye exam: Advised to follow up with Ophthalmology for diabetic eye exam       Relevant Medications   lisinopril-hydrochlorothiazide (ZESTORETIC) 20-12.5 MG tablet   Other Relevant Orders   POCT glycosylated hemoglobin (Hb A1C) (Completed)   CBC   Basic Metabolic Panel (BMET)   Lipid panel     Other   Morbid obesity ([place])    Contributing to uncontrolled DM and HTN Likely has OSA/OHS Emphasized to comply with diet recs      Vitamin D deficiency   Relevant Orders   Vitamin D (25 hydroxy)    Other Visit Diagnoses    Loose bowel movements       Relevant Medications   Probiotic  Product (BACID) CAPS   loperamide (IMODIUM) 2 MG capsule   OSA (obstructive sleep apnea)       Relevant Orders   Ambulatory referral to Pulmonology      Meds ordered this encounter  Medications  . famotidine (PEPCID) 20 MG tablet    Sig: Take 1 tablet (20 mg total) by mouth 2 (two) times daily.    Dispense:  60 tablet    Refill:  2  . Probiotic Product (BACID) CAPS    Sig: Take 1 capsule by mouth in the morning and at bedtime.    Dispense:  30 capsule    Refill:  2  . loperamide (IMODIUM) 2 MG capsule    Sig: Take 1 capsule (2 mg total) by mouth every 6 (six) hours as needed for diarrhea or loose stools.    Dispense:  30 capsule    Refill:  0  . lisinopril-hydrochlorothiazide (ZESTORETIC) 20-12.5 MG tablet    Sig: Take 1 tablet by mouth daily.    Dispense:  90 tablet    Refill:  0    Follow-up: Return in about 3 months (around 05/02/2021).    Lindell Spar, MD

## 2021-01-30 NOTE — Assessment & Plan Note (Signed)
BP Readings from Last 1 Encounters:  01/30/21 (!) 151/83   uncontrolled with Lisinopril and Spironolactone Switched to Lisinopril-HCTZ Could be a component of OSA/OHS, referred for sleep study Counseled for compliance with the medications Advised DASH diet and moderate exercise/walking, at least 150 mins/week

## 2021-01-30 NOTE — Assessment & Plan Note (Signed)
Contributing to uncontrolled DM and HTN Likely has OSA/OHS Emphasized to comply with diet recs

## 2021-01-30 NOTE — Assessment & Plan Note (Signed)
Refilled Pepcid Some of GI symptoms could be due to diabetic gastropathy. If persistent symptoms, can give trial of Reglan.

## 2021-01-31 ENCOUNTER — Encounter: Payer: Medicare HMO | Admitting: Adult Health

## 2021-02-01 LAB — CBC
Hematocrit: 33.3 % — ABNORMAL LOW (ref 34.0–46.6)
Hemoglobin: 10.8 g/dL — ABNORMAL LOW (ref 11.1–15.9)
MCH: 25.8 pg — ABNORMAL LOW (ref 26.6–33.0)
MCHC: 32.4 g/dL (ref 31.5–35.7)
MCV: 80 fL (ref 79–97)
Platelets: 244 10*3/uL (ref 150–450)
RBC: 4.19 x10E6/uL (ref 3.77–5.28)
RDW: 15.4 % (ref 11.7–15.4)
WBC: 8.3 10*3/uL (ref 3.4–10.8)

## 2021-02-01 LAB — BASIC METABOLIC PANEL
BUN/Creatinine Ratio: 14 (ref 9–23)
BUN: 15 mg/dL (ref 6–24)
CO2: 17 mmol/L — ABNORMAL LOW (ref 20–29)
Calcium: 9.3 mg/dL (ref 8.7–10.2)
Chloride: 110 mmol/L — ABNORMAL HIGH (ref 96–106)
Creatinine, Ser: 1.11 mg/dL — ABNORMAL HIGH (ref 0.57–1.00)
Glucose: 123 mg/dL — ABNORMAL HIGH (ref 65–99)
Potassium: 4.6 mmol/L (ref 3.5–5.2)
Sodium: 146 mmol/L — ABNORMAL HIGH (ref 134–144)
eGFR: 64 mL/min/{1.73_m2} (ref >59)

## 2021-02-01 LAB — LIPID PANEL
Chol/HDL Ratio: 2.4 ratio (ref 0.0–4.4)
Cholesterol, Total: 129 mg/dL (ref 100–199)
HDL: 53 mg/dL (ref >39)
LDL Chol Calc (NIH): 58 mg/dL (ref 0–99)
Triglycerides: 95 mg/dL (ref 0–149)
VLDL Cholesterol Cal: 18 mg/dL (ref 5–40)

## 2021-02-01 LAB — VITAMIN D 25 HYDROXY (VIT D DEFICIENCY, FRACTURES): Vit D, 25-Hydroxy: 26.4 ng/mL — ABNORMAL LOW (ref 30.0–100.0)

## 2021-02-20 ENCOUNTER — Encounter: Payer: Medicare HMO | Admitting: Adult Health

## 2021-03-12 ENCOUNTER — Other Ambulatory Visit: Payer: Self-pay | Admitting: Family Medicine

## 2021-03-12 DIAGNOSIS — E559 Vitamin D deficiency, unspecified: Secondary | ICD-10-CM

## 2021-03-26 ENCOUNTER — Other Ambulatory Visit: Payer: Self-pay

## 2021-03-26 ENCOUNTER — Institutional Professional Consult (permissible substitution): Payer: Medicare HMO | Admitting: Pulmonary Disease

## 2021-04-09 ENCOUNTER — Other Ambulatory Visit: Payer: Self-pay | Admitting: Family Medicine

## 2021-04-09 DIAGNOSIS — L680 Hirsutism: Secondary | ICD-10-CM

## 2021-04-17 DIAGNOSIS — E113523 Type 2 diabetes mellitus with proliferative diabetic retinopathy with traction retinal detachment involving the macula, bilateral: Secondary | ICD-10-CM | POA: Diagnosis not present

## 2021-04-17 DIAGNOSIS — H4051X2 Glaucoma secondary to other eye disorders, right eye, moderate stage: Secondary | ICD-10-CM | POA: Diagnosis not present

## 2021-04-17 DIAGNOSIS — E113513 Type 2 diabetes mellitus with proliferative diabetic retinopathy with macular edema, bilateral: Secondary | ICD-10-CM | POA: Diagnosis not present

## 2021-04-17 DIAGNOSIS — Z794 Long term (current) use of insulin: Secondary | ICD-10-CM | POA: Diagnosis not present

## 2021-04-24 ENCOUNTER — Ambulatory Visit: Payer: Medicare HMO | Admitting: Internal Medicine

## 2021-04-24 DIAGNOSIS — H4051X2 Glaucoma secondary to other eye disorders, right eye, moderate stage: Secondary | ICD-10-CM | POA: Diagnosis not present

## 2021-04-24 DIAGNOSIS — E113523 Type 2 diabetes mellitus with proliferative diabetic retinopathy with traction retinal detachment involving the macula, bilateral: Secondary | ICD-10-CM | POA: Diagnosis not present

## 2021-04-25 ENCOUNTER — Institutional Professional Consult (permissible substitution): Payer: Medicare HMO | Admitting: Pulmonary Disease

## 2021-05-02 ENCOUNTER — Ambulatory Visit: Payer: Medicare HMO | Admitting: Internal Medicine

## 2021-05-06 ENCOUNTER — Other Ambulatory Visit: Payer: Self-pay | Admitting: Internal Medicine

## 2021-05-06 DIAGNOSIS — I10 Essential (primary) hypertension: Secondary | ICD-10-CM

## 2021-05-07 ENCOUNTER — Telehealth: Payer: Self-pay

## 2021-05-07 ENCOUNTER — Ambulatory Visit: Payer: Medicare HMO | Admitting: Internal Medicine

## 2021-05-07 NOTE — Telephone Encounter (Signed)
Rachel George with pharmacy advised to fill and we would monitor with verbal understanding

## 2021-05-07 NOTE — Telephone Encounter (Signed)
Marchelle Folks from [facility] called. The prescription that was called in lisinopril-hydrochlorothiazide 20 mg has an interaction with another medication spironolactone 50 mg prescribed by another provider.Please return pharmacy [phone]

## 2021-05-07 NOTE — Telephone Encounter (Signed)
She has been on spironolactone for awhile looks like patel added lisinopril hctz

## 2021-05-07 NOTE — Telephone Encounter (Signed)
Patient needs bp medicine refill but she knows the provider needs to speak to pharmacist before prescribing any more medicines. [facility] [place].  atorvastatin (LIPITOR) 20 MG tablet  Call back # [phone]

## 2021-05-07 NOTE — Telephone Encounter (Signed)
Sometimes when lisinopril and spironolactone are combined, potassium can go up. She has appt on 6/13, so we can check a basic metabolic panel next week!

## 2021-05-07 NOTE — Telephone Encounter (Signed)
Keep the same, but have her come in for lab draw in 2 weeks to recheck her electrolytes.  When was she started on spironolactone and who started it?

## 2021-05-07 NOTE — Telephone Encounter (Signed)
Please see other phone message  

## 2021-05-14 ENCOUNTER — Ambulatory Visit: Payer: Medicare HMO | Admitting: Internal Medicine

## 2021-05-24 ENCOUNTER — Ambulatory Visit: Payer: Medicare HMO | Admitting: Internal Medicine

## 2021-06-06 ENCOUNTER — Ambulatory Visit: Payer: Medicare HMO | Admitting: Internal Medicine

## 2021-06-07 ENCOUNTER — Institutional Professional Consult (permissible substitution): Payer: Medicare HMO | Admitting: Pulmonary Disease

## 2021-06-09 ENCOUNTER — Other Ambulatory Visit: Payer: Self-pay | Admitting: Nurse Practitioner

## 2021-06-09 ENCOUNTER — Other Ambulatory Visit: Payer: Self-pay | Admitting: Internal Medicine

## 2021-06-09 DIAGNOSIS — E559 Vitamin D deficiency, unspecified: Secondary | ICD-10-CM

## 2021-06-09 DIAGNOSIS — R195 Other fecal abnormalities: Secondary | ICD-10-CM

## 2021-06-11 ENCOUNTER — Other Ambulatory Visit: Payer: Self-pay

## 2021-06-11 DIAGNOSIS — E785 Hyperlipidemia, unspecified: Secondary | ICD-10-CM

## 2021-06-11 MED ORDER — ATORVASTATIN CALCIUM 20 MG PO TABS: 20.0000 mg | ORAL_TABLET | Freq: Every day | ORAL | 1 refills | Status: DC

## 2021-06-13 ENCOUNTER — Ambulatory Visit (INDEPENDENT_AMBULATORY_CARE_PROVIDER_SITE_OTHER): Payer: Medicare HMO

## 2021-06-13 ENCOUNTER — Ambulatory Visit: Payer: Medicaid Other | Admitting: Podiatry

## 2021-06-13 ENCOUNTER — Other Ambulatory Visit: Payer: Self-pay

## 2021-06-13 ENCOUNTER — Other Ambulatory Visit: Payer: Self-pay | Admitting: Podiatry

## 2021-06-13 DIAGNOSIS — L97511 Non-pressure chronic ulcer of other part of right foot limited to breakdown of skin: Secondary | ICD-10-CM

## 2021-06-13 DIAGNOSIS — L97412 Non-pressure chronic ulcer of right heel and midfoot with fat layer exposed: Secondary | ICD-10-CM | POA: Diagnosis not present

## 2021-06-13 DIAGNOSIS — M79671 Pain in right foot: Secondary | ICD-10-CM

## 2021-06-13 DIAGNOSIS — S91104A Unspecified open wound of right lesser toe(s) without damage to nail, initial encounter: Secondary | ICD-10-CM | POA: Diagnosis not present

## 2021-06-13 DIAGNOSIS — E11610 Type 2 diabetes mellitus with diabetic neuropathic arthropathy: Secondary | ICD-10-CM | POA: Diagnosis not present

## 2021-06-13 DIAGNOSIS — E08621 Diabetes mellitus due to underlying condition with foot ulcer: Secondary | ICD-10-CM

## 2021-06-13 DIAGNOSIS — IMO0002 Reserved for concepts with insufficient information to code with codable children: Secondary | ICD-10-CM

## 2021-06-13 DIAGNOSIS — Z794 Long term (current) use of insulin: Secondary | ICD-10-CM

## 2021-06-13 DIAGNOSIS — E1165 Type 2 diabetes mellitus with hyperglycemia: Secondary | ICD-10-CM

## 2021-06-13 DIAGNOSIS — E1139 Type 2 diabetes mellitus with other diabetic ophthalmic complication: Secondary | ICD-10-CM

## 2021-06-13 MED ORDER — DOXYCYCLINE HYCLATE 100 MG PO TABS: 100.0000 mg | ORAL_TABLET | Freq: Two times a day (BID) | ORAL | 0 refills | Status: DC

## 2021-06-13 MED ORDER — INSULIN ASPART 100 UNIT/ML FLEXPEN: 14.0000 [IU] | PEN_INJECTOR | Freq: Three times a day (TID) | SUBCUTANEOUS | 11 refills | Status: DC

## 2021-06-13 MED ORDER — GENTAMICIN SULFATE 0.1 % EX CREA: 1.0000 "application " | TOPICAL_CREAM | Freq: Two times a day (BID) | CUTANEOUS | 1 refills | Status: DC

## 2021-06-13 MED ORDER — INSULIN DETEMIR 100 UNIT/ML ~~LOC~~ SOLN: 60.0000 [IU] | Freq: Every day | SUBCUTANEOUS | 11 refills | Status: DC

## 2021-06-13 NOTE — Progress Notes (Signed)
.  Subjective:  40 y.o. female with PMHx of diabetes mellitus presenting today as a new patient for evaluation of a sore to the right fifth toe.  Patient does have history of prior amputations as well as a history of Charcot neuroarthropathy back in 2018 to the patient states.  She would like to have the wound on the right fifth toe evaluated today.  She states it has been present for about 1 month.  She noticed some discoloration and drainage coming from the toe.  She presents for further treatment and evaluation   Past Medical History:  Diagnosis Date   Anemia    Cataract    Cellulitis of toe of right foot 10/07/2016   Depression    Diabetes mellitus without complication [facility])    dx age 78   Diabetic foot ulcer (HCC) 10/15/2016   Fatty liver disease, nonalcoholic    Glaucoma    Hyperlipidemia    Hypertension    Macular degeneration    Morbid obesity with BMI of 50.0-59.9, adult (HCC) 05/01/2017   Neuropathy    Obesity, Class III, BMI 40-49.9 (morbid obesity) (HCC) 05/01/2017   Osteolysis, right ankle and foot 05/01/2017   Osteomyelitis (HCC) 12/13/2016   Partial blindness    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  Dermatology:  Wound #1 noted to the 0.8 x 0.7 x 0.2 cm (LxWxD).   To the noted ulceration(s), there is no eschar. There is a moderate amount of slough, fibrin, and necrotic tissue noted. Granulation tissue and wound base is red. There is a minimal amount of serosanguineous drainage noted. There is no exposed bone muscle-tendon ligament or joint. There is no malodor. Periwound integrity is intact. Skin is warm, dry and supple bilateral lower extremities.  Vascular: Palpable pedal pulses bilaterally. No edema or erythema noted. Capillary refill within normal limits.  Neurological: Epicritic and protective threshold diminished bilaterally.    Musculoskeletal Exam: History of right great toe amputation.  Radiographic exam: Degenerative changes noted throughout the midtarsal joint of the right foot with collapse of the medial longitudinal arch complete consistent with a history of Charcot neuroarthropathy.  Absence of the right great toe also noted.  Focusing on the right fifth toe, there does appear to be some possibility of some slight cortical irregularity to the phalanx of the toe directly underlying the wound.  We will simply observe for now   Assessment: 1.  Ulcer right fifth toe secondary to diabetes mellitus 2. diabetes mellitus w/ peripheral neuropathy 3.  History of right great toe amputation    Plan of Care:  1. Patient was evaluated. 2. medically necessary excisional debridement including subcutaneous tissue was performed using a tissue nipper and a chisel blade. Excisional debridement of all the necrotic nonviable tissue down to healthy bleeding viable tissue was performed with post-debridement measurements same as pre-. 3. the wound was cleansed and dry sterile dressing applied. 4.  Cultures taken and sent to pathology for culture and sensitivity  5.  Prescription for doxycycline 100 mg 2 times daily #20  6.  Prescription for gentamicin cream applied daily with a Band-Aid 7.  Postsurgical shoe dispensed.  Weightbearing as tolerated daily 8.  Patient is to return to clinic in 2 weeks.   Felecia Shelling, DPM [facility]  Dr. Felecia Shelling, DPM  [address] [place], [place] [address]                Office [phone]  Fax [phone]

## 2021-06-15 LAB — WOUND CULTURE
MICRO NUMBER:: 12114722
SPECIMEN QUALITY:: ADEQUATE

## 2021-07-02 ENCOUNTER — Telehealth: Payer: Self-pay

## 2021-07-02 ENCOUNTER — Encounter: Payer: Self-pay | Admitting: Internal Medicine

## 2021-07-02 ENCOUNTER — Other Ambulatory Visit: Payer: Self-pay

## 2021-07-02 ENCOUNTER — Ambulatory Visit (INDEPENDENT_AMBULATORY_CARE_PROVIDER_SITE_OTHER): Payer: Medicare HMO | Admitting: Internal Medicine

## 2021-07-02 DIAGNOSIS — E1165 Type 2 diabetes mellitus with hyperglycemia: Secondary | ICD-10-CM | POA: Diagnosis not present

## 2021-07-02 DIAGNOSIS — I10 Essential (primary) hypertension: Secondary | ICD-10-CM

## 2021-07-02 DIAGNOSIS — E1139 Type 2 diabetes mellitus with other diabetic ophthalmic complication: Secondary | ICD-10-CM

## 2021-07-02 DIAGNOSIS — IMO0002 Reserved for concepts with insufficient information to code with codable children: Secondary | ICD-10-CM

## 2021-07-02 DIAGNOSIS — Z794 Long term (current) use of insulin: Secondary | ICD-10-CM | POA: Diagnosis not present

## 2021-07-02 MED ORDER — INSULIN DETEMIR 100 UNIT/ML FLEXPEN: 60.0000 [IU] | PEN_INJECTOR | Freq: Every day | SUBCUTANEOUS | 1 refills | Status: DC

## 2021-07-02 NOTE — Patient Instructions (Signed)
Please continue to take medications as prescribed.  Please schedule an appointment with Pulmonologist for sleep apnea evaluation.  You are being referred to Endocrinology for diabetes management.

## 2021-07-02 NOTE — Assessment & Plan Note (Signed)
HbA1C: 9.1 On Levemir 60 U QD and Novolog sliding scale Has not brought blood glucose log, states it stays around 110, with only few readings above 200 Saw Dr Fransico Him in the past, referral sent again Advised to follow diabetic diet On ACE and statin F/u BMP and lipid panel Diabetic eye exam: Advised to follow up with Ophthalmology for diabetic eye exam

## 2021-07-02 NOTE — Telephone Encounter (Signed)
If patient calls back, she needs a follow up appt with Dr Fransico Him, she must bring her meter and logs.

## 2021-07-02 NOTE — Progress Notes (Signed)
Virtual Visit via Telephone Note   This visit type was conducted due to national recommendations for restrictions regarding the COVID-19 Pandemic (e.g. social distancing) in an effort to limit this patient's exposure and mitigate transmission in our community.  Due to her co-morbid illnesses, this patient is at least at moderate risk for complications without adequate follow up.  This format is felt to be most appropriate for this patient at this time.  The patient did not have access to video technology/had technical difficulties with video requiring transitioning to audio format only (telephone).  All issues noted in this document were discussed and addressed.  No physical exam could be performed with this format.  Evaluation Performed:  Follow-up visit  Date:  07/02/2021   ID:  Rachel George, Rachel George 05/17/1981, MRN [id number]  Patient Location: Home Provider Location: Office/Clinic  Participants: Patient Location of Patient: Home Location of Provider: Telehealth Consent was obtain for visit to be over via telehealth. I verified that I am speaking with the correct person using two identifiers.  PCP:  Lindell Spar, MD   Chief Complaint:  Follow up of her chronic medical conditions  History of Present Illness:    Rachel George is a 40 y.o. female with past medical history of hypertension, DM type II with diabetic retinopathy, OSA and morbid obesity who  presents for follow up of her chronic medical conditions.   DM: Her HbA1C was 9.3 last time. She has been taking insulin regularly and has been trying to follow diabetic diet.  She states that her blood glucose has been running better lately.   HTN: BP is well-controlled at home according to her. Takes medications regularly. Patient denies headache, dizziness, chest pain, dyspnea or palpitations.   OSA: She had sleep study in the past, but did not hear back from the ordering provider. She had been noticed to have  low O2 sat at night when she was in the hospital. She reports nighttime awakenings and daytime fatigue.  She has not had a visit with Pulmonology for OSA evaluation.  The patient does not have symptoms concerning for COVID-19 infection (fever, chills, cough, or new shortness of breath).   Past Medical, Surgical, Social History, Allergies, and Medications have been Reviewed.  Past Medical History:  Diagnosis Date   Anemia    Cataract    Cellulitis of toe of right foot 10/07/2016   Depression    Diabetes mellitus without complication [facility])    dx age 37   Diabetic foot ulcer ([place]) 10/15/2016   Fatty liver disease, nonalcoholic    Glaucoma    Hyperlipidemia    Hypertension    Macular degeneration    Morbid obesity with BMI of 50.0-59.9, adult ([place]) 05/01/2017   Neuropathy    Obesity, Class III, BMI 40-49.9 (morbid obesity) ([place]) 05/01/2017   Osteolysis, right ankle and foot 05/01/2017   Osteomyelitis ([place]) 12/13/2016   Partial blindness    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   blood glucose meter kit and supplies Dispense based on patient and insurance preference. Use to test blood glucose 4 times daily. (FOR ICD-10 E10.9, E11.9).   diclofenac Sodium (VOLTAREN) 1 % GEL Apply 2 g topically 3 (three) times daily as needed.   dorzolamide-timolol (COSOPT) 22.3-6.8 MG/ML ophthalmic solution    famotidine (PEPCID) 20 MG tablet Take 1 tablet (20 mg total) by mouth 2 (two) times daily.   ferrous sulfate 324 MG TBEC Take 65 mg by  mouth.   gabapentin (NEURONTIN) 300 MG capsule Take 1 capsule (300 mg total) by mouth 2 (two) times daily. For pain   gentamicin cream (GARAMYCIN) 0.1 % Apply 1 application topically 2 (two) times daily.   insulin aspart (NOVOLOG) 100 UNIT/ML FlexPen Inject 14-20 Units into the skin 3 (three) times daily with meals.   insulin detemir (LEVEMIR) 100 UNIT/ML FlexPen Inject 60 Units into the skin daily.   lisinopril-hydrochlorothiazide (ZESTORETIC) 20-12.5 MG tablet Take 1 tablet by mouth once daily   loperamide (IMODIUM) 2 MG capsule TAKE 1 CAPSULE BY MOUTH EVERY 6 HOURS AS NEEDED FOR DIARRHEA OR  LOOSE  STOOL   MODERNA COVID-19 VACCINE 100 MCG/0.5ML injection    Probiotic Product (BACID) CAPS Take 1 capsule by mouth in the morning and at bedtime.   spironolactone (ALDACTONE) 50 MG tablet Take 1 tablet by mouth twice daily   Vitamin D, Ergocalciferol, (DRISDOL) 1.25 MG (50000 UNIT) CAPS capsule Take 1 capsule by mouth once a week   [DISCONTINUED] insulin detemir (LEVEMIR) 100 UNIT/ML injection Inject 0.6 mLs (60 Units total) into the skin at bedtime.     Allergies:   Other, Shellfish allergy, Oxycodone, and Percocet [oxycodone-acetaminophen]   ROS:   Please see the history of present illness.     All other systems reviewed and are negative.   Labs/Other Tests and Data Reviewed:    Recent Labs: 07/13/2020: ALT 7 01/30/2021: BUN 15; Creatinine, Ser 1.11; Hemoglobin 10.8; Platelets 244; Potassium 4.6; Sodium 146   Recent Lipid Panel Lab Results  Component Value Date/Time   CHOL 129 01/30/2021 11:34 AM   TRIG 95 01/30/2021 11:34 AM   HDL 53 01/30/2021 11:34 AM   CHOLHDL 2.4 01/30/2021 11:34 AM   CHOLHDL 3.9 01/26/2020 02:47 PM   LDLCALC 58 01/30/2021 11:34 AM   LDLCALC 134 (H) 01/26/2020 02:47 PM    Wt Readings from Last 3 Encounters:  01/30/21 (!) 428 lb 6.4 oz (194.3 kg)  11/10/20 (!) 428 lb (194.1 kg)  10/03/20 (!) 427 lb (193.7 kg)     ASSESSMENT & PLAN:    Essential  hypertension, benign Better controlled with Lisinopril-HCTZ and Spironolactone Could be a component of OSA/OHS, referred for sleep study Counseled for compliance with the medications Advised DASH diet and moderate exercise/walking, at least 150 mins/week  DM (diabetes mellitus), type 2, uncontrolled w/ophthalmic complication (HCC) OVZ8H: 9.1 On Levemir 60 U QD and Novolog sliding scale Has not brought blood glucose log, states it stays around 110, with only few readings above 200 Saw Dr Dorris Fetch in the past, referral sent again Advised to follow diabetic diet On ACE and statin F/u BMP and lipid panel Diabetic eye exam: Advised to follow up with Ophthalmology for diabetic eye exam   Time:   Today, I have spent 18 minutes reviewing the chart, including problem list, medications, and with the patient with telehealth technology discussing the above problems.   Medication Adjustments/Labs and Tests Ordered: Current medicines are  reviewed at length with the patient today.  Concerns regarding medicines are outlined above.   Tests Ordered: Orders Placed This Encounter  Procedures   CBC   CMP14+EGFR   Lipid panel   Hemoglobin A1c   Ambulatory referral to Endocrinology    Medication Changes: Meds ordered this encounter  Medications   insulin detemir (LEVEMIR) 100 UNIT/ML FlexPen    Sig: Inject 60 Units into the skin daily.    Dispense:  15 mL    Refill:  1     Note: This dictation was prepared with Dragon dictation along with smaller phrase technology. Similar sounding words can be transcribed inadequately or may not be corrected upon review. Any transcriptional errors that result from this process are unintentional.      Disposition:  Follow up  Signed, Lindell Spar, MD  07/02/2021 11:32 AM     [place]

## 2021-07-02 NOTE — Assessment & Plan Note (Signed)
Better controlled with Lisinopril-HCTZ and Spironolactone Could be a component of OSA/OHS, referred for sleep study Counseled for compliance with the medications Advised DASH diet and moderate exercise/walking, at least 150 mins/week

## 2021-07-03 DIAGNOSIS — E113523 Type 2 diabetes mellitus with proliferative diabetic retinopathy with traction retinal detachment involving the macula, bilateral: Secondary | ICD-10-CM | POA: Diagnosis not present

## 2021-07-03 DIAGNOSIS — Z794 Long term (current) use of insulin: Secondary | ICD-10-CM | POA: Diagnosis not present

## 2021-07-03 DIAGNOSIS — Z961 Presence of intraocular lens: Secondary | ICD-10-CM | POA: Diagnosis not present

## 2021-07-03 DIAGNOSIS — H4051X2 Glaucoma secondary to other eye disorders, right eye, moderate stage: Secondary | ICD-10-CM | POA: Diagnosis not present

## 2021-07-03 DIAGNOSIS — E11311 Type 2 diabetes mellitus with unspecified diabetic retinopathy with macular edema: Secondary | ICD-10-CM | POA: Diagnosis not present

## 2021-07-07 ENCOUNTER — Encounter: Payer: Self-pay | Admitting: Internal Medicine

## 2021-07-18 ENCOUNTER — Other Ambulatory Visit: Payer: Self-pay

## 2021-07-18 ENCOUNTER — Ambulatory Visit (INDEPENDENT_AMBULATORY_CARE_PROVIDER_SITE_OTHER): Payer: Medicare HMO | Admitting: Podiatry

## 2021-07-18 DIAGNOSIS — E08621 Diabetes mellitus due to underlying condition with foot ulcer: Secondary | ICD-10-CM

## 2021-07-18 DIAGNOSIS — L97412 Non-pressure chronic ulcer of right heel and midfoot with fat layer exposed: Secondary | ICD-10-CM

## 2021-07-18 NOTE — Progress Notes (Signed)
.    Subjective:  40 y.o. female with PMHx of diabetes mellitus presenting today for follow-up evaluation of an ulcer to the right fifth toe.  Patient states she is doing much better.  She took the oral antibiotics as prescribed.  No new complaints at this time   Past Medical History:  Diagnosis Date   Anemia    Cataract    Cellulitis of toe of right foot 10/07/2016   Depression    Diabetes mellitus without complication [facility])    dx age 48   Diabetic foot ulcer (HCC) 10/15/2016   Fatty liver disease, nonalcoholic    Glaucoma    Hyperlipidemia    Hypertension    Macular degeneration    Morbid obesity with BMI of 50.0-59.9, adult (HCC) 05/01/2017   Neuropathy    Obesity, Class III, BMI 40-49.9 (morbid obesity) (HCC) 05/01/2017   Osteolysis, right ankle and foot 05/01/2017   Osteomyelitis (HCC) 12/13/2016   Partial blindness    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  Dermatology:  Wound #1 noted to the right fifth toe has healed.  Complete reepithelialization has occurred.  There is no open wound after debridement.  Periwound is callused. Skin is warm, dry and supple bilateral lower extremities.  Vascular: Palpable pedal pulses bilaterally. No edema or erythema noted. Capillary refill within normal limits.  Neurological: Epicritic and protective threshold diminished bilaterally.   Musculoskeletal Exam: History of right great toe amputation.    Assessment: 1.  Ulcer right fifth toe secondary to diabetes mellitus; healed 2. diabetes mellitus w/ peripheral neuropathy 3.  History of right great toe amputation    Plan of Care:  1. Patient was evaluated. 2.  Light debridement of the wound was performed using a 312 scalpel.  No bleeding noted.  The wound is healed completely. 3.  Continue antibiotic cream and a light Band-Aid daily and to wear the  postsurgical shoe as a precaution is much as possible to avoid pressure from the fifth toe 4.  Patient to return to the clinic in 4 weeks for follow-up x-ray to ensure there is no osseous erosion of the underlying ulcer  Felecia Shelling, DPM [facility]  Dr. Felecia Shelling, DPM    [address] [place], [place] [address]                Office [phone]  Fax [phone]

## 2021-07-30 ENCOUNTER — Encounter: Payer: Self-pay | Admitting: Internal Medicine

## 2021-07-30 ENCOUNTER — Ambulatory Visit (INDEPENDENT_AMBULATORY_CARE_PROVIDER_SITE_OTHER): Payer: Medicare HMO | Admitting: Internal Medicine

## 2021-07-30 ENCOUNTER — Other Ambulatory Visit: Payer: Self-pay

## 2021-07-30 DIAGNOSIS — S8991XA Unspecified injury of right lower leg, initial encounter: Secondary | ICD-10-CM | POA: Diagnosis not present

## 2021-07-30 DIAGNOSIS — L03115 Cellulitis of right lower limb: Secondary | ICD-10-CM

## 2021-07-30 MED ORDER — MUPIROCIN 2 % EX OINT: 1.0000 "application " | TOPICAL_OINTMENT | Freq: Two times a day (BID) | CUTANEOUS | 0 refills | Status: DC

## 2021-07-30 MED ORDER — CEPHALEXIN 500 MG PO CAPS: 500.0000 mg | ORAL_CAPSULE | Freq: Three times a day (TID) | ORAL | 0 refills | Status: DC

## 2021-07-30 NOTE — Progress Notes (Signed)
Virtual Visit via Telephone Note   This visit type was conducted due to national recommendations for restrictions regarding the COVID-19 Pandemic (e.g. social distancing) in an effort to limit this patient's exposure and mitigate transmission in our community.  Due to her co-morbid illnesses, this patient is at least at moderate risk for complications without adequate follow up.  This format is felt to be most appropriate for this patient at this time.  The patient did not have access to video technology/had technical difficulties with video requiring transitioning to audio format only (telephone).  All issues noted in this document were discussed and addressed.  No physical exam could be performed with this format.  Evaluation Performed:  Follow-up visit  Date:  07/30/2021   ID:  Rachel George, Rachel George 1981-05-16, MRN [id number]  Patient Location: Home Provider Location: Office/Clinic  Participants: Patient Location of Patient: Home Location of Provider: Telehealth Consent was obtain for visit to be over via telehealth. I verified that I am speaking with the correct person using two identifiers.  PCP:  Anabel Halon, MD   Chief Complaint:  Leg pain and swelling  History of Present Illness:    Rachel George is a 40 y.o. female who has a televisit for c/o leg swelling and pain for last 5-6 days. She states that she hit her leg multiple times in the last week. She has noticed warmth and redness in the area. She has noticed bumps over her legs, which have slowly improved with heating pads. She has shared a picture of the right leg, which shows bruising over right leg. She is not clear whether she had any skin breakdown initially when she had injury. It was due to metal frame of her bed. She has not had tetanus vaccine in last 10 years.  The patient does not have symptoms concerning for COVID-19 infection (fever, chills, cough, or new shortness of breath).   Past Medical,  Surgical, Social History, Allergies, and Medications have been Reviewed.  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     Allergies:   Other, Shellfish allergy, Oxycodone, and Percocet [oxycodone-acetaminophen]   ROS:   Please see the history of present illness.     All other systems reviewed and are negative.   Labs/Other Tests and Data  Reviewed:    Recent Labs: 01/30/2021: BUN 15; Creatinine, Ser 1.11; Hemoglobin 10.8; Platelets 244; Potassium 4.6; Sodium 146   Recent Lipid Panel Lab Results  Component Value Date/Time   CHOL 129 01/30/2021 11:34 AM   TRIG 95 01/30/2021 11:34 AM   HDL 53 01/30/2021 11:34 AM   CHOLHDL 2.4 01/30/2021 11:34 AM   CHOLHDL 3.9 01/26/2020 02:47 PM   LDLCALC 58 01/30/2021 11:34 AM   LDLCALC 134 (H) 01/26/2020 02:47 PM    Wt Readings from Last 3 Encounters:  01/30/21 (!) 428 lb 6.4 oz (194.3 kg)  11/10/20 (!) 428 lb (194.1  kg)  10/03/20 (!) 427 lb (193.7 kg)     ASSESSMENT & PLAN:    Cellulitis Leg injury Started Keflex Metal related injury at home - needs TDaP vaccine, states she will get it in the office later today Advised to keep area clean and dry Fall prevention measures discussed   Time:   Today, I have spent 13 minutes reviewing the chart, including problem list, medications, and with the patient with telehealth technology discussing the above problems.   Medication Adjustments/Labs and Tests Ordered: Current medicines are reviewed at length with the patient today.  Concerns regarding medicines are outlined above.   Tests Ordered: No orders of the defined types were placed in this encounter.   Medication Changes: No orders of the defined types were placed in this encounter.    Note: This dictation was prepared with Dragon dictation along with smaller phrase technology. Similar sounding words can be transcribed inadequately or may not be corrected upon review. Any transcriptional errors that result from this process are unintentional.      Disposition:  Follow up  Signed, Anabel Halon, MD  07/30/2021 1:09 PM     [facility]

## 2021-07-30 NOTE — Patient Instructions (Signed)
Please start taking Keflex as prescribed.  Please come to office to get tetanus vaccine.

## 2021-08-03 ENCOUNTER — Encounter: Payer: Self-pay | Admitting: Podiatry

## 2021-08-08 ENCOUNTER — Other Ambulatory Visit: Payer: Self-pay

## 2021-08-08 ENCOUNTER — Ambulatory Visit (INDEPENDENT_AMBULATORY_CARE_PROVIDER_SITE_OTHER): Payer: Medicare HMO

## 2021-08-08 ENCOUNTER — Ambulatory Visit (INDEPENDENT_AMBULATORY_CARE_PROVIDER_SITE_OTHER): Payer: Medicare HMO | Admitting: Podiatry

## 2021-08-08 DIAGNOSIS — S93401A Sprain of unspecified ligament of right ankle, initial encounter: Secondary | ICD-10-CM | POA: Diagnosis not present

## 2021-08-08 DIAGNOSIS — L97412 Non-pressure chronic ulcer of right heel and midfoot with fat layer exposed: Secondary | ICD-10-CM

## 2021-08-08 DIAGNOSIS — M7751 Other enthesopathy of right foot: Secondary | ICD-10-CM

## 2021-08-08 DIAGNOSIS — E08621 Diabetes mellitus due to underlying condition with foot ulcer: Secondary | ICD-10-CM

## 2021-08-08 DIAGNOSIS — S9001XA Contusion of right ankle, initial encounter: Secondary | ICD-10-CM | POA: Diagnosis not present

## 2021-08-09 ENCOUNTER — Other Ambulatory Visit: Payer: Self-pay | Admitting: Internal Medicine

## 2021-08-09 DIAGNOSIS — L03115 Cellulitis of right lower limb: Secondary | ICD-10-CM

## 2021-08-09 DIAGNOSIS — I10 Essential (primary) hypertension: Secondary | ICD-10-CM

## 2021-08-09 MED ORDER — MUPIROCIN 2 % EX OINT: 1.0000 "application " | TOPICAL_OINTMENT | Freq: Two times a day (BID) | CUTANEOUS | 0 refills | Status: DC

## 2021-08-14 ENCOUNTER — Other Ambulatory Visit: Payer: Self-pay

## 2021-08-14 ENCOUNTER — Ambulatory Visit (INDEPENDENT_AMBULATORY_CARE_PROVIDER_SITE_OTHER): Payer: Medicare HMO | Admitting: Internal Medicine

## 2021-08-14 ENCOUNTER — Encounter: Payer: Self-pay | Admitting: Internal Medicine

## 2021-08-14 VITALS — BP 152/74 | HR 112 | Resp 18 | Ht 67.0 in | Wt >= 6400 oz

## 2021-08-14 DIAGNOSIS — Z23 Encounter for immunization: Secondary | ICD-10-CM

## 2021-08-14 DIAGNOSIS — IMO0002 Reserved for concepts with insufficient information to code with codable children: Secondary | ICD-10-CM

## 2021-08-14 DIAGNOSIS — L97919 Non-pressure chronic ulcer of unspecified part of right lower leg with unspecified severity: Secondary | ICD-10-CM

## 2021-08-14 DIAGNOSIS — E1142 Type 2 diabetes mellitus with diabetic polyneuropathy: Secondary | ICD-10-CM | POA: Diagnosis not present

## 2021-08-14 DIAGNOSIS — E1139 Type 2 diabetes mellitus with other diabetic ophthalmic complication: Secondary | ICD-10-CM | POA: Diagnosis not present

## 2021-08-14 DIAGNOSIS — Z124 Encounter for screening for malignant neoplasm of cervix: Secondary | ICD-10-CM

## 2021-08-14 DIAGNOSIS — L680 Hirsutism: Secondary | ICD-10-CM | POA: Diagnosis not present

## 2021-08-14 DIAGNOSIS — K219 Gastro-esophageal reflux disease without esophagitis: Secondary | ICD-10-CM

## 2021-08-14 DIAGNOSIS — E1165 Type 2 diabetes mellitus with hyperglycemia: Secondary | ICD-10-CM

## 2021-08-14 DIAGNOSIS — I10 Essential (primary) hypertension: Secondary | ICD-10-CM | POA: Diagnosis not present

## 2021-08-14 DIAGNOSIS — Z89411 Acquired absence of right great toe: Secondary | ICD-10-CM | POA: Diagnosis not present

## 2021-08-14 LAB — POCT GLYCOSYLATED HEMOGLOBIN (HGB A1C): HbA1c, POC (controlled diabetic range): 11.5 % — AB (ref 0.0–7.0)

## 2021-08-14 MED ORDER — FAMOTIDINE 20 MG PO TABS: 20.0000 mg | ORAL_TABLET | Freq: Two times a day (BID) | ORAL | 2 refills | Status: DC

## 2021-08-14 MED ORDER — INSULIN DETEMIR 100 UNIT/ML FLEXPEN: 80.0000 [IU] | PEN_INJECTOR | Freq: Every day | SUBCUTANEOUS | 0 refills | Status: DC

## 2021-08-14 MED ORDER — GABAPENTIN 300 MG PO CAPS: 300.0000 mg | ORAL_CAPSULE | Freq: Two times a day (BID) | ORAL | 5 refills | Status: DC

## 2021-08-14 MED ORDER — SPIRONOLACTONE 50 MG PO TABS: 50.0000 mg | ORAL_TABLET | Freq: Two times a day (BID) | ORAL | 2 refills | Status: DC

## 2021-08-14 NOTE — Assessment & Plan Note (Signed)
On Gabapentin 

## 2021-08-14 NOTE — Patient Instructions (Addendum)
You are being referred to General surgeon for evaluation of leg ulcers.  Please start taking Levemir 80 U instead of 60 mg at nighttime. Please continue to take meal time insulin as well.  Please continue to check blood glucose regularly. Please continue to follow low carb diet and ambulate as tolerated.  Please contact Pulmonology for scheduling sleep study.

## 2021-08-14 NOTE — Assessment & Plan Note (Signed)
Was treated with Cephalexin for possible cellulitis Worse today, appears to have an ulcer Referred to General surgery for possible need for debridement Keep area clean and dry Continue Mupirocin ointment

## 2021-08-14 NOTE — Assessment & Plan Note (Signed)
BP Readings from Last 1 Encounters:  08/14/21 (!) 152/74   Uncontrolled with Lisinopril-HCTZ and Spironolactone Could be a component of pain and OSA/OHS, had referred for sleep study, but has not set up appointment yet Counseled for compliance with the medications Advised DASH diet and moderate exercise/walking, at least 150 mins/week

## 2021-08-14 NOTE — Progress Notes (Signed)
Established Patient Office Visit  Subjective:  Patient ID: Rachel George, female    DOB: 1981-10-01  Age: 40 y.o. MRN: [id number]  CC:  Chief Complaint  Patient presents with   Follow-up    6 week follow up hit the bed frame weeks ago and right leg got infected this still hurts and doesn't look good     HPI Rachel George is a 40 y.o. female with PMH of uncontrolled HTN, uncontrolled DM with polyneuropathy and retinopathy, HLD, GERD and morbid obesity who presents for follow-up o of her chronic medical conditions and complaint of right leg pain.  She has developed an ulcer over her right leg.  Of note, she had a televisit for possible cellulitis and was prescribed cephalexin for it.  She also has been applying mupirocin ointment over it.  She had injury from the bed frame, but did not get Tdap vaccine as it was advised during the televisit.  She denies any further injury in the right leg.  She follows up with podiatrist for right ankle pain and swelling.  She has a history of right great toe amputation due to infection.  She is going to see a vascular surgeon for possible PAD/PVD assessment.  HTN: Her blood pressure was elevated in the office today.  She has been taking lisinopril HCTZ and spironolactone.  Compliance is questionable.  Of note, she likely has OSA/OHS, for which she was referred to sleep specialist, but has not set up appointment yet.  She denies any chest pain, dyspnea or palpitations.  DM: Her HbA1c was 11.5 in the office today.  She has been taking Levemir 60 units and NovoLog as per sliding scale.  She has not set up appointment with Dr. Dorris Fetch yet since last visit.  She states that she has been following low-carb diet.  Denies any polyuria or polyphagia currently.  Past Medical History:  Diagnosis Date   Anemia    Cataract    Cellulitis of toe of right foot 10/07/2016   Depression    Diabetes mellitus without complication [facility])    dx age 28   Diabetic  foot ulcer ([place]) 10/15/2016   Fatty liver disease, nonalcoholic    Glaucoma    Hyperlipidemia    Hypertension    Macular degeneration    Morbid obesity with BMI of 50.0-59.9, adult ([place]) 05/01/2017   Neuropathy    Obesity, Class III, BMI 40-49.9 (morbid obesity) ([place]) 05/01/2017   Osteolysis, right ankle and foot 05/01/2017   Osteomyelitis ([place]) 12/13/2016   Partial blindness    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   Suicidal ideation 11/04/2017    Past Surgical History:  Procedure Laterality Date   AMPUTATION TOE Right 12/16/2016   Procedure: RIGHT FIRST TOE AMPUTATION;  Surgeon: Vickie Epley, MD;  Location: [facility];  Service: General;  Laterality: Right;  Right first toe amputation for osteomyelitis   eye lid transplant     RETINAL LASER PROCEDURE      Family History  Problem Relation Age of Onset   Diabetes Mother    Thyroid disease Mother    Diabetes Father     Social History   Socioeconomic History   Marital status: Legally Separated    Spouse name: Manuella Blackson   Number of children: 0   Years of education: Not on file   Highest education level: High school graduate  Occupational History   Occupation: CNA personal care giver  Tobacco Use  Smoking status: Former    Packs/day: 0.25    Types: Cigarettes    Quit date: 12/11/2016    Years since quitting: 4.6   Smokeless tobacco: Never  Vaping Use   Vaping Use: Never used  Substance and Sexual Activity   Alcohol use: No   Drug use: No   Sexual activity: Yes    Partners: Male    Birth control/protection: None  Other Topics Concern   Not on file  Social History Narrative   Separated from husband-married for 7 years      Lives alone right now, in a disability apartment.   Right handed      Enjoys walking       Diet: eat one meal a day- veggies, meats, low carb    Caffeine: tea-4 cups daily    Water: 4-6 bottles of water      Wears seat belt   Smoke detectors    Does not use phone while driving    Social Determinants of Health   Financial Resource Strain: Medium Risk   Difficulty of Paying Living Expenses: Somewhat hard  Food Insecurity: No Food Insecurity   Worried About Charity fundraiser in the Last Year: Never true   Ran Out of Food in the Last Year: Never true  Transportation Needs: No Transportation Needs   Lack of Transportation (Medical): No   Lack of Transportation (Non-Medical): No  Physical Activity: Insufficiently Active   Days of Exercise per Week: 2 days   Minutes of Exercise per Session: 30 min  Stress: No Stress Concern Present   Feeling of Stress : Only a little  Social Connections: Socially Isolated   Frequency of Communication with Friends and Family: Three times a week   Frequency of Social Gatherings with Friends and Family: Twice a week   Attends Religious Services: Never   Marine scientist or Organizations: No   Attends Music therapist: Never   Marital Status: Never married  Human resources officer Violence: Not At Risk   Fear of Current or Ex-Partner: No   Emotionally Abused: No   Physically Abused: No   Sexually Abused: No    Outpatient Medications Prior to Visit  Medication Sig Dispense Refill   atorvastatin (LIPITOR) 20 MG tablet Take 1 tablet (20 mg total) by mouth daily at 6 PM. 90 tablet 1   blood glucose meter kit and supplies Dispense based on patient and insurance preference. Use to test blood glucose 4 times daily. (FOR ICD-10 E10.9, E11.9). 1 each 0   diclofenac Sodium (VOLTAREN) 1 % GEL Apply 2 g topically 3 (three) times daily as needed. 50 g 0   dorzolamide-timolol (COSOPT) 22.3-6.8 MG/ML ophthalmic solution      ferrous sulfate 324 MG TBEC Take 65 mg by mouth.     gentamicin cream (GARAMYCIN) 0.1 % Apply 1 application topically 2 (two) times daily. 30 g 1   insulin aspart (NOVOLOG) 100 UNIT/ML FlexPen Inject 14-20 Units into the skin 3 (three) times daily with meals. 15 mL 11   lisinopril-hydrochlorothiazide  (ZESTORETIC) 20-12.5 MG tablet Take 1 tablet by mouth once daily 90 tablet 0   loperamide (IMODIUM) 2 MG capsule TAKE 1 CAPSULE BY MOUTH EVERY 6 HOURS AS NEEDED FOR DIARRHEA OR  LOOSE  STOOL 30 capsule 0   mupirocin ointment (BACTROBAN) 2 % Apply 1 application topically 2 (two) times daily. 22 g 0   Probiotic Product (BACID) CAPS Take 1 capsule by mouth in the morning  and at bedtime. 30 capsule 2   Vitamin D, Ergocalciferol, (DRISDOL) 1.25 MG (50000 UNIT) CAPS capsule Take 1 capsule by mouth once a week 12 capsule 0   famotidine (PEPCID) 20 MG tablet Take 1 tablet (20 mg total) by mouth 2 (two) times daily. 60 tablet 2   gabapentin (NEURONTIN) 300 MG capsule Take 1 capsule (300 mg total) by mouth 2 (two) times daily. For pain 60 capsule 0   insulin detemir (LEVEMIR) 100 UNIT/ML FlexPen Inject 60 Units into the skin daily. 15 mL 1   spironolactone (ALDACTONE) 50 MG tablet Take 1 tablet by mouth twice daily 60 tablet 0   cephALEXin (KEFLEX) 500 MG capsule Take 1 capsule (500 mg total) by mouth 3 (three) times daily. (Patient not taking: Reported on 08/14/2021) 15 capsule 0   MODERNA COVID-19 VACCINE 100 MCG/0.5ML injection  (Patient not taking: Reported on 08/14/2021)     No facility-administered medications prior to visit.    Allergies  Allergen Reactions   Other Anaphylaxis   Shellfish Allergy Anaphylaxis, Shortness Of Breath and Swelling    Facial Swelling   Oxycodone Nausea And Vomiting   Percocet [Oxycodone-Acetaminophen] Itching and Nausea And Vomiting    ROS Review of Systems  Constitutional:  Positive for fatigue. Negative for chills and fever.  HENT:  Negative for congestion, sinus pressure, sinus pain and sore throat.   Eyes:  Negative for pain and discharge.  Respiratory:  Negative for cough and shortness of breath.   Cardiovascular:  Negative for chest pain and palpitations.  Gastrointestinal:  Negative for abdominal pain, diarrhea, nausea and vomiting.  Endocrine: Negative  for polydipsia and polyuria.  Genitourinary:  Negative for dysuria and hematuria.  Musculoskeletal:  Negative for neck pain and neck stiffness.  Skin:  Positive for rash and wound.  Neurological:  Negative for dizziness and weakness.  Psychiatric/Behavioral:  Positive for sleep disturbance. Negative for agitation and behavioral problems.      Objective:    Physical Exam Vitals reviewed.  Constitutional:      General: She is not in acute distress.    Appearance: She is obese. She is not diaphoretic.  HENT:     Head: Normocephalic and atraumatic.     Nose: Nose normal. No congestion.     Mouth/Throat:     Mouth: Mucous membranes are moist.     Pharynx: No posterior oropharyngeal erythema.  Eyes:     General: No scleral icterus. Cardiovascular:     Rate and Rhythm: Normal rate and regular rhythm.     Heart sounds: Normal heart sounds. No murmur heard.    Comments: DPA 1+ b/l Pulmonary:     Breath sounds: Normal breath sounds. No wheezing or rales.  Abdominal:     Palpations: Abdomen is soft.     Tenderness: There is no abdominal tenderness.  Musculoskeletal:     Cervical back: Neck supple. No tenderness.     Comments: S/p amputation of great toe - right LE  Skin:    General: Skin is warm.     Comments: Ulcer - about 1 cm in diameter over right leg 2 additional superficial ulcerated lesions over right leg, no discharge noted Healthy granulation tissue noted surrounding the ulcer  Neurological:     General: No focal deficit present.     Mental Status: She is alert and oriented to person, place, and time.  Psychiatric:        Mood and Affect: Mood normal.  Behavior: Behavior normal.    BP (!) 152/74 (BP Location: Right Arm, Cuff Size: Normal)   Pulse (!) 112   Resp 18   Ht $R'5\' 7"'wd$  (1.702 m)   Wt (!) 427 lb 0.6 oz (193.7 kg)   SpO2 94%   BMI 66.88 kg/m  Wt Readings from Last 3 Encounters:  08/14/21 (!) 427 lb 0.6 oz (193.7 kg)  01/30/21 (!) 428 lb 6.4 oz (194.3  kg)  11/10/20 (!) 428 lb (194.1 kg)     Health Maintenance Due  Topic Date Due   PNEUMOCOCCAL POLYSACCHARIDE VACCINE AGE 59-64 HIGH RISK  Never done   Hepatitis C Screening  Never done   PAP SMEAR-Modifier  Never done   FOOT EXAM  09/09/2017   COVID-19 Vaccine (3 - Booster for Moderna series) 10/02/2020    There are no preventive care reminders to display for this patient.  Lab Results  Component Value Date   TSH 0.88 01/26/2020   Lab Results  Component Value Date   WBC 8.3 01/30/2021   HGB 10.8 (L) 01/30/2021   HCT 33.3 (L) 01/30/2021   MCV 80 01/30/2021   PLT 244 01/30/2021   Lab Results  Component Value Date   NA 146 (H) 01/30/2021   K 4.6 01/30/2021   CO2 17 (L) 01/30/2021   GLUCOSE 123 (H) 01/30/2021   BUN 15 01/30/2021   CREATININE 1.11 (H) 01/30/2021   BILITOT 0.3 07/13/2020   ALKPHOS 86 07/13/2020   AST 10 07/13/2020   ALT 7 07/13/2020   PROT 6.5 07/13/2020   ALBUMIN 3.5 (L) 07/13/2020   CALCIUM 9.3 01/30/2021   ANIONGAP 12 07/22/2019   EGFR 64 01/30/2021   Lab Results  Component Value Date   CHOL 129 01/30/2021   Lab Results  Component Value Date   HDL 53 01/30/2021   Lab Results  Component Value Date   LDLCALC 58 01/30/2021   Lab Results  Component Value Date   TRIG 95 01/30/2021   Lab Results  Component Value Date   CHOLHDL 2.4 01/30/2021   Lab Results  Component Value Date   HGBA1C 11.5 (A) 08/14/2021      Assessment & Plan:   Problem List Items Addressed This Visit       Cardiovascular and Mediastinum   Essential hypertension, benign    BP Readings from Last 1 Encounters:  08/14/21 (!) 152/74  Uncontrolled with Lisinopril-HCTZ and Spironolactone Could be a component of pain and OSA/OHS, had referred for sleep study, but has not set up appointment yet Counseled for compliance with the medications Advised DASH diet and moderate exercise/walking, at least 150 mins/week      Relevant Medications   spironolactone  (ALDACTONE) 50 MG tablet     Digestive   Gastroesophageal reflux disease    Refilled Pepcid Some of GI symptoms could be due to diabetic gastropathy. If persistent symptoms, can give trial of Reglan.      Relevant Medications   famotidine (PEPCID) 20 MG tablet     Endocrine   Diabetic polyneuropathy associated with type 2 diabetes mellitus (HCC)    On Gabapentin      Relevant Medications   gabapentin (NEURONTIN) 300 MG capsule   insulin detemir (LEVEMIR) 100 UNIT/ML FlexPen   DM (diabetes mellitus), type 2, uncontrolled w/ophthalmic complication (HCC)    Lab Results  Component Value Date   HGBA1C 11.5 (A) 08/14/2021  On Levemir 60 U QD and Novolog sliding scale Has not brought blood glucose log, states  it stays around 110, with only few readings above 200 Saw Dr Dorris Fetch in the past, referral sent again - helped set up next appointment  Increased Levemir to 80 U qHS for now Advised to follow diabetic diet On ACE and statin F/u CMP and lipid panel Diabetic eye exam: Advised to follow up with Ophthalmology for diabetic eye exam      Relevant Medications   insulin detemir (LEVEMIR) 100 UNIT/ML FlexPen   Other Relevant Orders   POCT glycosylated hemoglobin (Hb A1C) (Completed)     Musculoskeletal and Integument   Hirsutism    Takes Spironolactone 50 mg BID      Relevant Medications   spironolactone (ALDACTONE) 50 MG tablet     Other   Need for immunization against influenza   Relevant Orders   Flu Vaccine QUAD 9mo+IM (Fluarix, Fluzone & Alfiuria Quad PF) (Completed)   Ulcer of right lower extremity ([place]) - Primary    Was treated with Cephalexin for possible cellulitis Worse today, appears to have an ulcer Referred to General surgery for possible need for debridement Keep area clean and dry Continue Mupirocin ointment      Relevant Orders   Ambulatory referral to General Surgery   Other Visit Diagnoses     Need for viral immunization       Relevant Orders    Tdap vaccine greater than or equal to 7yo IM (Completed)   Routine cervical smear       Relevant Orders   Ambulatory referral to Obstetrics / Gynecology       Meds ordered this encounter  Medications   spironolactone (ALDACTONE) 50 MG tablet    Sig: Take 1 tablet (50 mg total) by mouth 2 (two) times daily.    Dispense:  60 tablet    Refill:  2   gabapentin (NEURONTIN) 300 MG capsule    Sig: Take 1 capsule (300 mg total) by mouth 2 (two) times daily. For pain    Dispense:  60 capsule    Refill:  5   famotidine (PEPCID) 20 MG tablet    Sig: Take 1 tablet (20 mg total) by mouth 2 (two) times daily.    Dispense:  60 tablet    Refill:  2   insulin detemir (LEVEMIR) 100 UNIT/ML FlexPen    Sig: Inject 80 Units into the skin at bedtime.    Dispense:  15 mL    Refill:  0    Dose change    Follow-up: Return in about 3 months (around 11/13/2021) for HTN, DM, OSA.    Lindell Spar, MD

## 2021-08-14 NOTE — Assessment & Plan Note (Signed)
Lab Results  Component Value Date   HGBA1C 11.5 (A) 08/14/2021   On Levemir 60 U QD and Novolog sliding scale Has not brought blood glucose log, states it stays around 110, with only few readings above 200 Saw Dr Fransico Him in the past, referral sent again - helped set up next appointment  Increased Levemir to 80 U qHS for now Advised to follow diabetic diet On ACE and statin F/u CMP and lipid panel Diabetic eye exam: Advised to follow up with Ophthalmology for diabetic eye exam

## 2021-08-14 NOTE — Assessment & Plan Note (Signed)
Refilled Pepcid Some of GI symptoms could be due to diabetic gastropathy. If persistent symptoms, can give trial of Reglan. 

## 2021-08-14 NOTE — Assessment & Plan Note (Signed)
Takes Spironolactone 50 mg BID

## 2021-08-20 ENCOUNTER — Ambulatory Visit: Payer: Medicare HMO | Admitting: Podiatry

## 2021-08-20 NOTE — Progress Notes (Signed)
.    Subjective:  40 y.o. female with PMHx of diabetes mellitus presenting today for follow-up evaluation of an ulcer to the right fifth toe.  Patient states that there continues to be some slight improvement.  Patient has a new complaint regarding pain and swelling to the right ankle.  Patient states that she sustained a fall injury at home on 07/28/2021.  She denies losing consciousness or head trauma.  She says that she hurt her right ankle.  DOI: 07/28/2021.  She presents for follow-up treatment and evaluation  Past Medical History:  Diagnosis Date   Anemia    Cataract    Cellulitis of toe of right foot 10/07/2016   Depression    Diabetes mellitus without complication [facility])    dx age 72   Diabetic foot ulcer (HCC) 10/15/2016   Fatty liver disease, nonalcoholic    Glaucoma    Hyperlipidemia    Hypertension    Macular degeneration    Morbid obesity with BMI of 50.0-59.9, adult (HCC) 05/01/2017   Neuropathy    Obesity, Class III, BMI 40-49.9 (morbid obesity) (HCC) 05/01/2017   Osteolysis, right ankle and foot 05/01/2017   Osteomyelitis (HCC) 12/13/2016   Partial blindness    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  Dermatology:  Wound #1 noted to the right fifth toe now measures 0.3 time 0.3 x 0.2 cm.  To the noted ulceration there is no eschar.  There is a moderate amount of slough fibrin and necrotic tissue noted.  There is no exposed bone muscle tendon ligament or joint.  Periwound integrity is intact. Skin is warm, dry and supple bilateral lower extremities.  Vascular: Palpable pedal pulses bilaterally. No edema or erythema noted. Capillary refill within normal limits.  Neurological: Epicritic and protective threshold diminished bilaterally.   Musculoskeletal Exam: History of right great toe amputation.  Radiographic exam: Normal osseous  mineralization.  Joint spaces preserved.  No fractures identified.  No cortical irregularities.    Assessment: 1.  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 diabetes mellitus w/ peripheral neuropathy 3.  History of right great toe amputation 4.  Ankle sprain right.  DOI: 07/28/2021    Plan of Care:  1. Patient was evaluated. 2.  Medically necessary excisional debridement including subcutaneous tissue was performed using a tissue nipper.  Excisional debridement of all necrotic nonviable tissue down to healthy bleeding viable tissue was performed with postdebridement measurement same as pre- 3.  Continue gentamicin cream daily 4.  In regards to the ankle sprain, Ace wrap was applied to the ankle and short cam boot dispensed.  Weightbearing as tolerated x4 weeks 5.  Return to clinic in 4 weeks for follow-up x-ray of the ankle and foot right  Felecia Shelling, DPM [facility]  Dr. Felecia Shelling, DPM    [address] [place], [place] [address]                Office [phone]  Fax [phone]

## 2021-08-27 ENCOUNTER — Ambulatory Visit: Payer: Medicare HMO | Admitting: "Endocrinology

## 2021-09-03 ENCOUNTER — Other Ambulatory Visit: Payer: Self-pay | Admitting: Internal Medicine

## 2021-09-03 ENCOUNTER — Other Ambulatory Visit: Payer: Self-pay | Admitting: Family Medicine

## 2021-09-03 DIAGNOSIS — IMO0002 Reserved for concepts with insufficient information to code with codable children: Secondary | ICD-10-CM

## 2021-09-03 DIAGNOSIS — R195 Other fecal abnormalities: Secondary | ICD-10-CM

## 2021-09-04 ENCOUNTER — Ambulatory Visit: Payer: Medicare HMO | Admitting: General Surgery

## 2021-09-05 ENCOUNTER — Other Ambulatory Visit: Payer: Self-pay | Admitting: Internal Medicine

## 2021-09-05 ENCOUNTER — Ambulatory Visit: Payer: Medicare HMO | Admitting: "Endocrinology

## 2021-09-05 ENCOUNTER — Other Ambulatory Visit: Payer: Self-pay | Admitting: Family Medicine

## 2021-09-05 ENCOUNTER — Other Ambulatory Visit: Payer: Self-pay | Admitting: Nurse Practitioner

## 2021-09-05 DIAGNOSIS — E559 Vitamin D deficiency, unspecified: Secondary | ICD-10-CM

## 2021-09-05 DIAGNOSIS — R195 Other fecal abnormalities: Secondary | ICD-10-CM

## 2021-09-05 DIAGNOSIS — E119 Type 2 diabetes mellitus without complications: Secondary | ICD-10-CM

## 2021-09-05 MED ORDER — LOPERAMIDE HCL 2 MG PO CAPS
2.0000 mg | ORAL_CAPSULE | Freq: Four times a day (QID) | ORAL | 0 refills | Status: DC
Start: 2021-09-05 — End: 2021-11-02

## 2021-09-05 MED ORDER — BLOOD GLUCOSE METER KIT: PACK | 0 refills | Status: DC

## 2021-09-10 ENCOUNTER — Other Ambulatory Visit: Payer: Self-pay

## 2021-09-10 ENCOUNTER — Ambulatory Visit (INDEPENDENT_AMBULATORY_CARE_PROVIDER_SITE_OTHER): Payer: Medicare HMO

## 2021-09-10 ENCOUNTER — Ambulatory Visit (INDEPENDENT_AMBULATORY_CARE_PROVIDER_SITE_OTHER): Payer: Medicare HMO | Admitting: Podiatry

## 2021-09-10 DIAGNOSIS — S9001XA Contusion of right ankle, initial encounter: Secondary | ICD-10-CM

## 2021-09-10 DIAGNOSIS — M7751 Other enthesopathy of right foot: Secondary | ICD-10-CM | POA: Diagnosis not present

## 2021-09-10 DIAGNOSIS — M79672 Pain in left foot: Secondary | ICD-10-CM

## 2021-09-10 DIAGNOSIS — E08621 Diabetes mellitus due to underlying condition with foot ulcer: Secondary | ICD-10-CM

## 2021-09-10 DIAGNOSIS — L97412 Non-pressure chronic ulcer of right heel and midfoot with fat layer exposed: Secondary | ICD-10-CM

## 2021-09-12 ENCOUNTER — Ambulatory Visit: Payer: Medicare HMO | Admitting: "Endocrinology

## 2021-09-23 NOTE — Progress Notes (Signed)
.    Subjective:  40 y.o. female with PMHx of diabetes mellitus presenting today for follow-up evaluation of an ulcer to the right fifth toe.  Patient states that she is doing much better.  She believes that the wound has healed to the fifth toe.  Patient also presents for follow-up treatment regarding pain and swelling to the right ankle.  Patient states that she sustained a fall injury at home on 07/28/2021.  She denies losing consciousness or head trauma.  She says that she hurt her right ankle.  DOI: 07/28/2021.  Patient states that her ankle is significantly better.  She no longer has any pain associated to the area.   Past Medical History:  Diagnosis Date   Anemia    Cataract    Cellulitis of toe of right foot 10/07/2016   Depression    Diabetes mellitus without complication [facility])    dx age 62   Diabetic foot ulcer (HCC) 10/15/2016   Fatty liver disease, nonalcoholic    Glaucoma    Hyperlipidemia    Hypertension    Macular degeneration    Morbid obesity with BMI of 50.0-59.9, adult (HCC) 05/01/2017   Neuropathy    Obesity, Class III, BMI 40-49.9 (morbid obesity) (HCC) 05/01/2017   Osteolysis, right ankle and foot 05/01/2017   Osteomyelitis (HCC) 12/13/2016   Partial blindness    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  Dermatology:  Wound #1 noted to the right fifth toe has healed.  Complete reepithelialization has occurred.  Skin is warm, dry and supple bilateral lower extremities.  Vascular: Palpable pedal pulses bilaterally. No edema or erythema noted. Capillary refill within normal limits.  Neurological: Epicritic and protective threshold diminished bilaterally.   Musculoskeletal Exam: History of right great toe amputation.  Negative for any significant pain on palpation to the ankle joint   Assessment: 1.  Ulcer right fifth toe secondary to  diabetes mellitus; healed 2. diabetes mellitus w/ peripheral neuropathy 3.  History of right great toe amputation 4.  Ankle sprain right.  DOI: 07/28/2021; resolved    Plan of Care:  1. Patient was evaluated. 2.  Patient may resume full activity no restrictions 3.  Recommend good foot hygiene and close monitoring of her feet 4.  Return to clinic as needed  Felecia Shelling, DPM [facility]  Dr. Felecia Shelling, DPM    [address] [place], [place] [address]                Office [phone]  Fax [phone]

## 2021-09-27 ENCOUNTER — Ambulatory Visit: Payer: Medicare HMO | Admitting: General Surgery

## 2021-10-02 ENCOUNTER — Encounter: Payer: Medicare HMO | Admitting: Adult Health

## 2021-10-05 ENCOUNTER — Other Ambulatory Visit: Payer: Self-pay | Admitting: Internal Medicine

## 2021-10-05 DIAGNOSIS — I10 Essential (primary) hypertension: Secondary | ICD-10-CM

## 2021-10-12 ENCOUNTER — Institutional Professional Consult (permissible substitution): Payer: Medicare HMO | Admitting: Pulmonary Disease

## 2021-10-18 ENCOUNTER — Ambulatory Visit: Payer: Medicare HMO | Admitting: General Surgery

## 2021-10-24 ENCOUNTER — Encounter: Payer: Medicare HMO | Admitting: Women's Health

## 2021-11-02 ENCOUNTER — Other Ambulatory Visit: Payer: Self-pay | Admitting: Nurse Practitioner

## 2021-11-02 ENCOUNTER — Other Ambulatory Visit: Payer: Self-pay | Admitting: Internal Medicine

## 2021-11-02 DIAGNOSIS — E785 Hyperlipidemia, unspecified: Secondary | ICD-10-CM

## 2021-11-02 DIAGNOSIS — E119 Type 2 diabetes mellitus without complications: Secondary | ICD-10-CM

## 2021-11-02 DIAGNOSIS — R195 Other fecal abnormalities: Secondary | ICD-10-CM

## 2021-11-14 ENCOUNTER — Ambulatory Visit: Payer: Medicare HMO | Admitting: Internal Medicine

## 2021-11-19 ENCOUNTER — Ambulatory Visit (INDEPENDENT_AMBULATORY_CARE_PROVIDER_SITE_OTHER): Payer: Medicare HMO

## 2021-11-19 ENCOUNTER — Other Ambulatory Visit: Payer: Self-pay

## 2021-11-19 DIAGNOSIS — Z0000 Encounter for general adult medical examination without abnormal findings: Secondary | ICD-10-CM | POA: Diagnosis not present

## 2021-11-19 DIAGNOSIS — Z1159 Encounter for screening for other viral diseases: Secondary | ICD-10-CM | POA: Diagnosis not present

## 2021-11-19 DIAGNOSIS — E1165 Type 2 diabetes mellitus with hyperglycemia: Secondary | ICD-10-CM | POA: Diagnosis not present

## 2021-11-19 DIAGNOSIS — Z794 Long term (current) use of insulin: Secondary | ICD-10-CM | POA: Diagnosis not present

## 2021-11-19 DIAGNOSIS — Z5941 Food insecurity: Secondary | ICD-10-CM | POA: Diagnosis not present

## 2021-11-19 MED ORDER — LEVEMIR FLEXTOUCH 100 UNIT/ML ~~LOC~~ SOPN: PEN_INJECTOR | SUBCUTANEOUS | 5 refills | Status: DC

## 2021-11-19 NOTE — Progress Notes (Signed)
I connected with  Rachel George on 11/19/21 by a audio enabled telemedicine application and verified that I am speaking with the correct person using two identifiers.  Patient Location: Home  Provider Location: Office/Clinic  I discussed the limitations of evaluation and management by telemedicine. The patient expressed understanding and agreed to proceed.  Subjective:   Rachel George is a 40 y.o. female who presents for Medicare Annual (Subsequent) preventive examination.  Review of Systems     Cardiac Risk Factors include: diabetes mellitus;dyslipidemia;hypertension;smoking/ tobacco exposure;obesity (BMI >30kg/m2)     Objective:    There were no vitals filed for this visit. There is no height or weight on file to calculate BMI.  Advanced Directives 11/19/2021 11/10/2020 07/22/2019 03/28/2019 03/28/2019 12/06/2018 12/05/2018  Does Patient Have a Medical Advance Directive? _0  No No  Would patient like information on creating a medical advance directive? Yes (ED - Information included in AVS) No - Patient declined No - Patient declined - No - Guardian declined No - Patient declined -  Some encounter information is confidential and restricted. Go to Review Flowsheets activity to see all data.    Current Medications (verified) Outpatient Encounter Medications as of 11/19/2021  Medication Sig   atorvastatin (LIPITOR) 20 MG tablet TAKE 1 TABLET BY MOUTH ONCE DAILY 6 IN THE EVENING   Blood Glucose Monitoring Suppl (ACCU-CHEK GUIDE ME) w/Device KIT USE TESTING SUPPLIES TO TEST 4 TIMES DAILY.   diclofenac Sodium (VOLTAREN) 1 % GEL Apply 2 g topically 3 (three) times daily as needed.   dorzolamide-timolol (COSOPT) 22.3-6.8 MG/ML ophthalmic solution    famotidine (PEPCID) 20 MG tablet Take 1 tablet (20 mg total) by mouth 2 (two) times daily.   ferrous sulfate 324 MG TBEC Take 65 mg by mouth.   gabapentin (NEURONTIN) 300 MG capsule Take 1 capsule (300 mg total) by  mouth 2 (two) times daily. For pain   gentamicin cream (GARAMYCIN) 0.1 % Apply 1 application topically 2 (two) times daily.   insulin aspart (NOVOLOG) 100 UNIT/ML FlexPen Inject 14-20 Units into the skin 3 (three) times daily with meals.   lisinopril-hydrochlorothiazide (ZESTORETIC) 20-12.5 MG tablet Take 1 tablet by mouth once daily   loperamide (IMODIUM) 2 MG capsule TAKE 1 CAPSULE BY MOUTH EVERY 6 HOURS   mupirocin ointment (BACTROBAN) 2 % Apply 1 application topically 2 (two) times daily.   Probiotic Product (BACID) CAPS Take 1 capsule by mouth in the morning and at bedtime.   spironolactone (ALDACTONE) 50 MG tablet Take 1 tablet (50 mg total) by mouth 2 (two) times daily.   Vitamin D, Ergocalciferol, (DRISDOL) 1.25 MG (50000 UNIT) CAPS capsule Take 1 capsule by mouth once a week   [DISCONTINUED] LEVEMIR FLEXTOUCH 100 UNIT/ML FlexTouch Pen INJECT 80 UNITS SUBCUTANEOUSLY AT BEDTIME   insulin detemir (LEVEMIR FLEXTOUCH) 100 UNIT/ML FlexPen INJECT 80 UNITS SUBCUTANEOUSLY AT BEDTIME   No facility-administered encounter medications on file as of 11/19/2021.    Allergies (verified) Other, Shellfish allergy, Oxycodone, and Percocet [oxycodone-acetaminophen]   History: Past Medical History:  Diagnosis Date   Anemia    Cataract    Cellulitis of toe of right foot 10/07/2016   Depression    Diabetes mellitus without complication [facility])    dx age 82   Diabetic foot ulcer ([place]) 10/15/2016   Fatty liver disease, nonalcoholic    Glaucoma    Hyperlipidemia    Hypertension    Macular degeneration    Morbid obesity with BMI  of 50.0-59.9, adult ([place]) 05/01/2017   Neuropathy    Obesity, Class III, BMI 40-49.9 (morbid obesity) ([place]) 05/01/2017   Osteolysis, right ankle and foot 05/01/2017   Osteomyelitis ([place]) 12/13/2016   Partial blindness    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   Suicidal ideation 11/04/2017   Past Surgical History:  Procedure Laterality Date   AMPUTATION TOE Right 12/16/2016    Procedure: RIGHT FIRST TOE AMPUTATION;  Surgeon: Vickie Epley, MD;  Location: [facility];  Service: General;  Laterality: Right;  Right first toe amputation for osteomyelitis   eye lid transplant     RETINAL LASER PROCEDURE     Family History  Problem Relation Age of Onset   Diabetes Mother    Thyroid disease Mother    Diabetes Father    Social History   Socioeconomic History   Marital status: Legally Separated    Spouse name: Lorynn Moeser   Number of children: 0   Years of education: Not on file   Highest education level: High school graduate  Occupational History   Occupation: CNA personal care giver  Tobacco Use   Smoking status: Former    Packs/day: 0.25    Types: Cigarettes    Quit date: 12/11/2016    Years since quitting: 4.9   Smokeless tobacco: Never  Vaping Use   Vaping Use: Never used  Substance and Sexual Activity   Alcohol use: No   Drug use: No   Sexual activity: Yes    Partners: Male    Birth control/protection: None  Other Topics Concern   Not on file  Social History Narrative   Separated from husband-married for 7 years      Lives alone right now, in a disability apartment.   Right handed      Enjoys walking       Diet: eat one meal a day- veggies, meats, low carb    Caffeine: tea-4 cups daily    Water: 4-6 bottles of water      Wears seat belt   Smoke detectors    Does not use phone while driving   Social Determinants of Health   Financial Resource Strain: Medium Risk   Difficulty of Paying Living Expenses: Somewhat hard  Food Insecurity: Landscape architect Present   Worried About Charity fundraiser in the Last Year: Often true   Arboriculturist in the Last Year: Often true  Transportation Needs: No Transportation Needs   Lack of Transportation (Medical): No   Lack of Transportation (Non-Medical): No  Physical Activity: Sufficiently Active   Days of Exercise per Week: 4 days   Minutes of Exercise per Session: 60 min  Stress: Not on  file  Social Connections: Socially Isolated   Frequency of Communication with Friends and Family: Three times a week   Frequency of Social Gatherings with Friends and Family: Twice a week   Attends Religious Services: Never   Marine scientist or Organizations: No   Attends Music therapist: Never   Marital Status: Never married    Tobacco Counseling Counseling given: Not Answered   Clinical Intake:  Pre-visit preparation completed: No  Pain : No/denies pain     Nutritional Status: BMI > 30  Obese Diabetes: Yes CBG done?: No Did pt. bring in CBG monitor from home?: No  How often do you need to have someone help you when you read instructions, pamphlets, or other written materials from your doctor or  pharmacy?: 1 - Never What is the last grade level you completed in school?: 12th  Diabetic?Nutrition Risk Assessment:  Has the patient had any N/V/D within the last 2 months?  No  Does the patient have any non-healing wounds?  No  Has the patient had any unintentional weight loss or weight gain?  No   Diabetes:  Is the patient diabetic?  Yes  If diabetic, was a CBG obtained today?  No  Did the patient bring in their glucometer from home?  no How often do you monitor your CBG's? Daily .   Financial Strains and Diabetes Management:  Are you having any financial strains with the device, your supplies or your medication? No .  Does the patient want to be seen by Chronic Care Management for management of their diabetes?  Yes  Would the patient like to be referred to a Nutritionist or for Diabetic Management?  No   Diabetic Exams:  Diabetic Eye Exam: Completed yes  Diabetic foot exam due Interpreter Needed?: No      Activities of Daily Living In your present state of health, do you have any difficulty performing the following activities: 11/19/2021  Hearing? N  Vision? N  Difficulty concentrating or making decisions? N  Walking or climbing  stairs? Y  Dressing or bathing? N  Doing errands, shopping? N  Preparing Food and eating ? N  Using the Toilet? N  In the past six months, have you accidently leaked urine? N  Do you have problems with loss of bowel control? N  Managing your Medications? N  Managing your Finances? N  Housekeeping or managing your Housekeeping? Y  Some recent data might be hidden    Patient Care Team: Lindell Spar, MD as PCP - General (Internal Medicine)  Indicate any recent Medical Services you may have received from other than [facility] providers in the past year (date may be approximate).     Assessment:   This is a routine wellness examination for Rachel George.  Hearing/Vision screen No results found.  Dietary issues and exercise activities discussed: Current Exercise Habits: Home exercise routine, Type of exercise: walking, Time (Minutes): 60, Frequency (Times/Week): 4, Weekly Exercise (Minutes/Week): 240, Intensity: Mild, Exercise limited by: orthopedic condition(s)   Goals Addressed   None    Depression Screen PHQ 2/9 Scores 11/19/2021 08/14/2021 07/30/2021 07/02/2021 01/30/2021 01/16/2021 11/10/2020  PHQ - 2 Score 0 0 0 0 0 0 2  PHQ- 9 Score - - - 0 - - 8    Fall Risk Fall Risk  11/19/2021 08/14/2021 07/30/2021 07/02/2021 01/30/2021  Falls in the past year? _0 0 0  Number falls in past yr: 0 1 1 0 0  Injury with Fall? 0 1 1 0 0  Risk for fall due to : - History of fall(s);Impaired balance/gait History of fall(s);Impaired balance/gait;Impaired mobility No Fall Risks No Fall Risks  Follow up - Falls evaluation completed;Education provided;Falls prevention discussed Falls evaluation completed;Education provided;Falls prevention discussed;Follow up appointment Falls evaluation completed Falls evaluation completed    FALL RISK PREVENTION PERTAINING TO THE HOME:  Any stairs in or around the home? No  If so, are there any without handrails? No  Home free of loose throw rugs in walkways, pet beds,  electrical cords, etc? Yes  Adequate lighting in your home to reduce risk of falls? Yes   ASSISTIVE DEVICES UTILIZED TO PREVENT FALLS:  Life alert? No  Use of a cane, walker or w/c? No  Grab bars  in the bathroom? Yes  Shower chair or bench in shower? No  Elevated toilet seat or a handicapped toilet? No    Cognitive Function:     6CIT Screen 11/19/2021 11/10/2020  What Year? 0 points 0 points  What month? 0 points 0 points  What time? 0 points 0 points  Count back from 20 0 points 0 points  Months in reverse 0 points 0 points  Repeat phrase 0 points 0 points  Total Score 0 0    Immunizations Immunization History  Administered Date(s) Administered   Influenza,inj,Quad PF,6+ Mos 12/14/2016, 09/11/2017, 12/08/2018, 10/03/2020, 08/14/2021   Influenza-Unspecified 09/01/2017   Moderna SARS-COV2 Booster Vaccination 03/10/2021   Moderna Sars-Covid-2 Vaccination 04/04/2020, 05/02/2020   Tdap 08/14/2021    TDAP status: Up to date  Flu Vaccine status: Up to date  Pneumococcal vaccine status: Due, Education has been provided regarding the importance of this vaccine. Advised may receive this vaccine at local pharmacy or Health Dept. Aware to provide a copy of the vaccination record if obtained from local pharmacy or Health Dept. Verbalized acceptance and understanding.  Covid-19 vaccine status: Completed vaccines  Qualifies for Shingles Vaccine? No   Zostavax completed No   Shingrix Completed?: No.    Education has been provided regarding the importance of this vaccine. Patient has been advised to call insurance company to determine out of pocket expense if they have not yet received this vaccine. Advised may also receive vaccine at local pharmacy or Health Dept. Verbalized acceptance and understanding.  Screening Tests Health Maintenance  Topic Date Due   Pneumococcal Vaccine 47-58 Years old (1 - PCV) Never done   Hepatitis C Screening  Never done   PAP SMEAR-Modifier  Never  done   FOOT EXAM  09/09/2017   COVID-19 Vaccine (3 - Booster for Moderna series) 05/05/2021   OPHTHALMOLOGY EXAM  11/28/2021   HEMOGLOBIN A1C  02/11/2022   TETANUS/TDAP  08/15/2031   INFLUENZA VACCINE  Completed   HIV Screening  Completed   HPV VACCINES  Aged Out    Health Maintenance  Health Maintenance Due  Topic Date Due   Pneumococcal Vaccine 45-40 Years old (1 - PCV) Never done   Hepatitis C Screening  Never done   PAP SMEAR-Modifier  Never done   FOOT EXAM  09/09/2017   COVID-19 Vaccine (3 - Booster for Moderna series) 05/05/2021    Colorectal cancer screening: No longer required.   Mammogram status: Ordered will wait until first of year. Pt provided with contact info and advised to call to schedule appt.     Lung Cancer Screening: (Low Dose CT Chest recommended if Age 18-80 years, 30 pack-year currently smoking OR have quit w/in 15years.) does not qualify.   Lung Cancer Screening Referral: no  Additional Screening:  Hepatitis C Screening: does qualify; ordered  Vision Screening: Recommended annual ophthalmology exams for early detection of glaucoma and other disorders of the eye. Is the patient up to date with their annual eye exam?  Yes  Who is the provider or what is the name of the office in which the patient attends annual eye exams? Yes, [facility]- dr Brigitte Pulse If pt is not established with a provider, would they like to be referred to a provider to establish care? .   Dental Screening: Recommended annual dental exams for proper oral hygiene  Community Resource Referral / Chronic Care Management: CRR required this visit?  No   CCM required this visit?  Yes  Plan:     I have personally reviewed and noted the following in the patients chart:   Medical and social history Use of alcohol, tobacco or illicit drugs  Current medications and supplements including opioid prescriptions.  Functional ability and status Nutritional  status Physical activity Advanced directives List of other physicians Hospitalizations, surgeries, and ER visits in previous 12 months Vitals Screenings to include cognitive, depression, and falls Referrals and appointments  In addition, I have reviewed and discussed with patient certain preventive protocols, quality metrics, and best practice recommendations. A written personalized care plan for preventive services as well as general preventive health recommendations were provided to patient.     Kate Sable, LPN, LPN   [id number]   Nurse Notes:  Rachel George , Thank you for taking time to come for your Medicare Wellness Visit. I appreciate your ongoing commitment to your health goals. Please review the following plan we discussed and let me know if I can assist you in the future.   These are the goals we discussed:  Goals      Weight (lb) < 200 lb (90.7 kg)     Wants to lose 75 lbs.        This is a list of the screening recommended for you and due dates:  Health Maintenance  Topic Date Due   Pneumococcal Vaccination (1 - PCV) Never done   Hepatitis C Screening: USPSTF Recommendation to screen - Ages 66-79 yo.  Never done   Pap Smear  Never done   Complete foot exam   09/09/2017   COVID-19 Vaccine (3 - Booster for Moderna series) 05/05/2021   Eye exam for diabetics  11/28/2021   Hemoglobin A1C  02/11/2022   Tetanus Vaccine  08/15/2031   Flu Shot  Completed   HIV Screening  Completed   HPV Vaccine  Aged Out

## 2021-11-19 NOTE — Patient Instructions (Signed)

## 2021-11-22 ENCOUNTER — Telehealth: Payer: Self-pay

## 2021-11-22 NOTE — Telephone Encounter (Signed)
° °  Telephone encounter was:  Successful.  11/22/2021 Name: [name] MRN: [id number] DOB: 08/30/1981  [name] is a 40 y.o. year old female who is a primary care patient of Anabel Halon, MD . The community resource team was consulted for assistance with Food Insecurity  Care guide performed the following interventions:  Pt does not qualify for meals on wheels at this time due to her age. However, pt was sent [facility], information to the [employer] and [facility] which they both offer Food & Nutrition Programs, Pantries, and Emergency Financial Assistance as pt mentioned billing encounters on our call . Patient advised she has received my e-mail and at this time, she does not have any more further questions or concerns.  Follow Up Plan:  No further follow up planned at this time. The patient has been provided with needed resources.  [facility] Guide, Embedded Care Coordination [facility]  [place], [place] [address]  Main Phone: [phone]   E-mail: [email or web address]  Website: [email or web address]

## 2021-12-02 ENCOUNTER — Other Ambulatory Visit: Payer: Self-pay | Admitting: Internal Medicine

## 2021-12-02 DIAGNOSIS — I10 Essential (primary) hypertension: Secondary | ICD-10-CM

## 2021-12-07 ENCOUNTER — Other Ambulatory Visit: Payer: Self-pay

## 2021-12-07 DIAGNOSIS — E559 Vitamin D deficiency, unspecified: Secondary | ICD-10-CM

## 2021-12-07 DIAGNOSIS — R195 Other fecal abnormalities: Secondary | ICD-10-CM

## 2021-12-07 DIAGNOSIS — E785 Hyperlipidemia, unspecified: Secondary | ICD-10-CM

## 2021-12-07 DIAGNOSIS — E119 Type 2 diabetes mellitus without complications: Secondary | ICD-10-CM

## 2021-12-07 DIAGNOSIS — L680 Hirsutism: Secondary | ICD-10-CM

## 2021-12-07 MED ORDER — INSULIN ASPART 100 UNIT/ML FLEXPEN: 14.0000 [IU] | PEN_INJECTOR | Freq: Three times a day (TID) | SUBCUTANEOUS | 11 refills | Status: DC

## 2021-12-07 MED ORDER — ATORVASTATIN CALCIUM 20 MG PO TABS: ORAL_TABLET | ORAL | 0 refills | Status: DC

## 2021-12-07 MED ORDER — ACCU-CHEK GUIDE ME W/DEVICE KIT: PACK | 0 refills | Status: AC

## 2021-12-07 MED ORDER — LEVEMIR FLEXTOUCH 100 UNIT/ML ~~LOC~~ SOPN: PEN_INJECTOR | SUBCUTANEOUS | 5 refills | Status: DC

## 2021-12-07 MED ORDER — VITAMIN D (ERGOCALCIFEROL) 1.25 MG (50000 UNIT) PO CAPS: 50000.0000 [IU] | ORAL_CAPSULE | ORAL | 0 refills | Status: DC

## 2021-12-07 MED ORDER — LOPERAMIDE HCL 2 MG PO CAPS: ORAL_CAPSULE | ORAL | 0 refills | Status: DC

## 2021-12-07 MED ORDER — SPIRONOLACTONE 50 MG PO TABS: 50.0000 mg | ORAL_TABLET | Freq: Two times a day (BID) | ORAL | 2 refills | Status: DC

## 2021-12-10 ENCOUNTER — Other Ambulatory Visit: Payer: Self-pay | Admitting: *Deleted

## 2021-12-10 ENCOUNTER — Ambulatory Visit: Payer: Medicare HMO | Admitting: Internal Medicine

## 2021-12-10 DIAGNOSIS — E1142 Type 2 diabetes mellitus with diabetic polyneuropathy: Secondary | ICD-10-CM

## 2021-12-10 DIAGNOSIS — K219 Gastro-esophageal reflux disease without esophagitis: Secondary | ICD-10-CM

## 2021-12-10 MED ORDER — GABAPENTIN 300 MG PO CAPS: 300.0000 mg | ORAL_CAPSULE | Freq: Two times a day (BID) | ORAL | 5 refills | Status: AC

## 2021-12-10 MED ORDER — FAMOTIDINE 20 MG PO TABS: 20.0000 mg | ORAL_TABLET | Freq: Two times a day (BID) | ORAL | 2 refills | Status: DC

## 2021-12-15 ENCOUNTER — Other Ambulatory Visit: Payer: Self-pay | Admitting: Internal Medicine

## 2021-12-15 DIAGNOSIS — R195 Other fecal abnormalities: Secondary | ICD-10-CM

## 2021-12-25 ENCOUNTER — Encounter: Payer: Self-pay | Admitting: Internal Medicine

## 2021-12-25 ENCOUNTER — Ambulatory Visit: Payer: Medicare HMO

## 2021-12-25 ENCOUNTER — Ambulatory Visit (INDEPENDENT_AMBULATORY_CARE_PROVIDER_SITE_OTHER): Payer: Medicare HMO | Admitting: Internal Medicine

## 2021-12-25 ENCOUNTER — Other Ambulatory Visit: Payer: Self-pay

## 2021-12-25 DIAGNOSIS — Z20822 Contact with and (suspected) exposure to covid-19: Secondary | ICD-10-CM | POA: Diagnosis not present

## 2021-12-25 DIAGNOSIS — J0110 Acute frontal sinusitis, unspecified: Secondary | ICD-10-CM

## 2021-12-25 MED ORDER — NOREL AD 4-10-325 MG PO TABS: 1.0000 | ORAL_TABLET | Freq: Three times a day (TID) | ORAL | 0 refills | Status: DC | PRN

## 2021-12-25 NOTE — Progress Notes (Signed)
Virtual Visit via Telephone Note   This visit type was conducted due to national recommendations for restrictions regarding the COVID-19 Pandemic (e.g. social distancing) in an effort to limit this patient's exposure and mitigate transmission in our community.  Due to her co-morbid illnesses, this patient is at least at moderate risk for complications without adequate follow up.  This format is felt to be most appropriate for this patient at this time.  The patient did not have access to video technology/had technical difficulties with video requiring transitioning to audio format only (telephone).  All issues noted in this document were discussed and addressed.  No physical exam could be performed with this format.  Evaluation Performed:  Follow-up visit  Date:  12/25/2021   ID:  Tian, Davison Dec 13, 1980, MRN [id number]  Patient Location: Home Provider Location: Office/Clinic  Participants: Patient Location of Patient: Home Location of Provider: Telehealth Consent was obtain for visit to be over via telehealth. I verified that I am speaking with the correct person using two identifiers.  PCP:  Lindell Spar, MD   Chief Complaint: Cough, nasal congestion and sinus pressure related headache  History of Present Illness:    Oda Placke is a 41 y.o. female who has a telemetry visit for complaint of cough, nasal congestion and sinus pressure related headache for last 2-3 days.  She denies any fever, chills or recent worsening of dyspnea or wheezing.  She has tried taking TheraFlu with mild relief.  The patient does have symptoms concerning for COVID-19 infection (fever, chills, cough, or new shortness of breath).   Past Medical, Surgical, Social History, Allergies, and Medications have been Reviewed.  Past Medical History:  Diagnosis Date   Anemia    Cataract    Cellulitis of toe of right foot 10/07/2016   Depression    Diabetes mellitus without  complication [facility])    dx age 21   Diabetic foot ulcer ([place]) 10/15/2016   Fatty liver disease, nonalcoholic    Glaucoma    Hyperlipidemia    Hypertension    Macular degeneration    Morbid obesity with BMI of 50.0-59.9, adult ([place]) 05/01/2017   Neuropathy    Obesity, Class III, BMI 40-49.9 (morbid obesity) ([place]) 05/01/2017   Osteolysis, right ankle and foot 05/01/2017   Osteomyelitis ([place]) 12/13/2016   Partial blindness    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   Chlorphen-PE-Acetaminophen (NOREL AD) 4-10-325 MG TABS Take 1 tablet by mouth 3 (three) times daily as needed.   diclofenac Sodium (VOLTAREN) 1 % GEL Apply 2 g topically 3 (three) times daily as needed.   dorzolamide-timolol (COSOPT) 22.3-6.8 MG/ML ophthalmic solution    famotidine (PEPCID) 20 MG tablet Take 1 tablet (20 mg total) by mouth 2 (two) times daily.   ferrous sulfate 324 MG TBEC Take 65  mg by mouth.   gabapentin (NEURONTIN) 300 MG capsule Take 1 capsule (300 mg total) by mouth 2 (two) times daily. For pain   gentamicin cream (GARAMYCIN) 0.1 % Apply 1 application topically 2 (two) times daily.   insulin aspart (NOVOLOG) 100 UNIT/ML FlexPen Inject 14-20 Units into the skin 3 (three) times daily with meals.   insulin detemir (LEVEMIR FLEXTOUCH) 100 UNIT/ML FlexPen INJECT 80 UNITS SUBCUTANEOUSLY AT BEDTIME   lisinopril-hydrochlorothiazide (ZESTORETIC)  20-12.5 MG tablet Take 1 tablet by mouth once daily   loperamide (IMODIUM) 2 MG capsule TAKE 1 CAPSULE BY MOUTH EVERY 6 HOURS   mupirocin ointment (BACTROBAN) 2 % Apply 1 application topically 2 (two) times daily.   Probiotic Product (BACID) CAPS Take 1 capsule by mouth in the morning and at bedtime.   spironolactone (ALDACTONE) 50 MG tablet Take 1 tablet (50 mg total) by mouth 2 (two) times daily.   Vitamin D, Ergocalciferol, (DRISDOL) 1.25 MG (50000 UNIT) CAPS capsule Take 1 capsule (50,000 Units total) by mouth once a week.     Allergies:   Other, Shellfish allergy, Oxycodone, and Percocet [oxycodone-acetaminophen]   ROS:   Please see the history of present illness.     All other systems reviewed and are negative.   Labs/Other Tests and Data Reviewed:    Recent Labs: 01/30/2021: BUN 15; Creatinine, Ser 1.11; Hemoglobin 10.8; Platelets 244; Potassium 4.6; Sodium 146   Recent Lipid Panel Lab Results  Component Value Date/Time   CHOL 129 01/30/2021 11:34 AM   TRIG 95 01/30/2021 11:34 AM   HDL 53 01/30/2021 11:34 AM   CHOLHDL 2.4 01/30/2021 11:34 AM   CHOLHDL 3.9 01/26/2020 02:47 PM   LDLCALC 58 01/30/2021 11:34 AM   LDLCALC 134 (H) 01/26/2020 02:47 PM    Wt Readings from Last 3 Encounters:  08/14/21 (!) 427 lb 0.6 oz (193.7 kg)  01/30/21 (!) 428 lb 6.4 oz (194.3 kg)  11/10/20 (!) 428 lb (194.1 kg)     ASSESSMENT & PLAN:    Acute sinusitis Suspected COVID-19 infection Check rapid flu and COVID RT-PCR Norel as needed for nasal congestion Mucinex or Robitussin as needed for cough Nasal saline spray as needed for nasal congestion  Time:   Today, I have spent 9 minutes reviewing the chart, including problem list, medications, and with the patient with telehealth technology discussing the above problems.   Medication Adjustments/Labs and Tests Ordered: Current medicines are reviewed at length with the patient today.  Concerns regarding medicines are outlined above.    Tests Ordered: No orders of the defined types were placed in this encounter.   Medication Changes: Meds ordered this encounter  Medications   Chlorphen-PE-Acetaminophen (NOREL AD) 4-10-325 MG TABS    Sig: Take 1 tablet by mouth 3 (three) times daily as needed.    Dispense:  30 tablet    Refill:  0     Note: This dictation was prepared with Dragon dictation along with smaller phrase technology. Similar sounding words can be transcribed inadequately or may not be corrected upon review. Any transcriptional errors that result from this process are unintentional.      Disposition:  Follow up  Signed, Lindell Spar, MD  12/25/2021 11:30 AM     [facility]

## 2021-12-25 NOTE — Patient Instructions (Signed)
Please come to office for rapid flu and COVID test.  Please take Norel for nasal congestion and fatigue.  Please use nasal saline spray for nasal congestion.

## 2022-01-18 ENCOUNTER — Ambulatory Visit: Payer: Medicare HMO | Admitting: Nurse Practitioner

## 2022-01-18 DIAGNOSIS — E11311 Type 2 diabetes mellitus with unspecified diabetic retinopathy with macular edema: Secondary | ICD-10-CM | POA: Diagnosis not present

## 2022-01-18 DIAGNOSIS — H4051X2 Glaucoma secondary to other eye disorders, right eye, moderate stage: Secondary | ICD-10-CM | POA: Diagnosis not present

## 2022-01-18 DIAGNOSIS — E113523 Type 2 diabetes mellitus with proliferative diabetic retinopathy with traction retinal detachment involving the macula, bilateral: Secondary | ICD-10-CM | POA: Diagnosis not present

## 2022-01-18 DIAGNOSIS — Z794 Long term (current) use of insulin: Secondary | ICD-10-CM | POA: Diagnosis not present

## 2022-01-21 ENCOUNTER — Ambulatory Visit: Payer: Medicare HMO | Admitting: Nurse Practitioner

## 2022-01-21 ENCOUNTER — Other Ambulatory Visit: Payer: Self-pay

## 2022-01-21 ENCOUNTER — Ambulatory Visit (INDEPENDENT_AMBULATORY_CARE_PROVIDER_SITE_OTHER): Payer: Medicare HMO | Admitting: Nurse Practitioner

## 2022-01-21 ENCOUNTER — Encounter: Payer: Self-pay | Admitting: Nurse Practitioner

## 2022-01-21 DIAGNOSIS — J029 Acute pharyngitis, unspecified: Secondary | ICD-10-CM

## 2022-01-21 DIAGNOSIS — J329 Chronic sinusitis, unspecified: Secondary | ICD-10-CM | POA: Diagnosis not present

## 2022-01-21 MED ORDER — AZITHROMYCIN 250 MG PO TABS: ORAL_TABLET | ORAL | 0 refills | Status: AC

## 2022-01-21 MED ORDER — SALINE SPRAY 0.65 % NA SOLN
1.0000 | NASAL | 0 refills | Status: DC | PRN
Start: 2022-01-21 — End: 2022-07-26

## 2022-01-21 MED ORDER — FLUTICASONE PROPIONATE 50 MCG/ACT NA SUSP: 2.0000 | Freq: Every day | NASAL | 6 refills | Status: DC

## 2022-01-21 NOTE — Progress Notes (Signed)
Virtual Visit via Telephone Note  I connected with Rachel George on 01/21/22 at  by telephone and verified that I am speaking with the correct person using two identifiers.I spent 8 minutes on this telephone encounter.  Location: Patient: home Provider: office   I discussed the limitations, risks, security and privacy concerns of performing an evaluation and management service by telephone and the availability of in person appointments. I also discussed with the patient that there may be a patient responsible charge related to this service. The patient expressed understanding and agreed to proceed.   History of Present Illness: Pt c/o sore throat since one day ago, she has been taking salt gargle and it is helping some , she has fever,body aches, fatigue,  sneezing , running nose, nasal congestion, has dark greenish colored nasal discharge, sinus pressure at night for weeks She has been taking claritin 10mg  daily but no improvement.  Pt does not have concerning symptoms for COVID since her symptoms has been going on weeks now. Pt denies any recent known sick contact.    Observations/Objective: Chronic sinusitis Will treat with azithromycin 250mg  due to history of T2DM and the length of time that he has been having symptoms , also due to systemic symptoms of fever, body aches , fatigue RX Azithromycin 250mg  tablets,  Take 2 tablets on day 1, then 1 tablet daily on days 2 through 5       Start Flonase nasal spray, use 2 spray daily Saline nasal spray as needed Continue Claritin 10 mg daily   Sore throat. Pt will come for rapid strep test.  Continue salt gargle PRN. Tylenol  650mg  6 hours PRN.  Drink warm water, soup.   Assessment and Plan:   Follow Up Instructions:    I discussed the assessment and treatment plan with the patient. The patient was provided an opportunity to ask questions and all were answered. The patient agreed with the plan and demonstrated an understanding of  the instructions.   The patient was advised to call back or seek an in-person evaluation if the symptoms worsen or if the condition fails to improve as anticipated.

## 2022-01-21 NOTE — Assessment & Plan Note (Signed)
Pt will come for rapid strep test.  Continue salt gargle PRN. Tylenol  650mg  6 hours PRN.  Drink warm water, soup.

## 2022-01-21 NOTE — Assessment & Plan Note (Signed)
Chronic sinusitis Will treat with azithromycin 250mg  due to history of T2DM and the length of time that he has been having symptoms , also due to systemic symptoms of fever, body aches , fatigue RX Azithromycin 250mg  tablets,  Take 2 tablets on day 1, then 1 tablet daily on days 2 through 5       Start Flonase nasal spray, use 2 spray daily Saline nasal spray as needed Continue Claritin 10 mg daily

## 2022-02-12 ENCOUNTER — Ambulatory Visit: Payer: Medicare HMO | Admitting: Nurse Practitioner

## 2022-02-13 ENCOUNTER — Other Ambulatory Visit: Payer: Self-pay | Admitting: Internal Medicine

## 2022-02-13 DIAGNOSIS — R195 Other fecal abnormalities: Secondary | ICD-10-CM

## 2022-02-18 ENCOUNTER — Telehealth: Payer: Self-pay

## 2022-02-18 NOTE — Telephone Encounter (Signed)
Patient called needs to know how much blood pressure meds to take and when. ? ?Call back # [phone] ?

## 2022-02-18 NOTE — Telephone Encounter (Signed)
LMTRC

## 2022-02-20 ENCOUNTER — Other Ambulatory Visit: Payer: Self-pay | Admitting: Internal Medicine

## 2022-02-20 DIAGNOSIS — K219 Gastro-esophageal reflux disease without esophagitis: Secondary | ICD-10-CM

## 2022-02-20 DIAGNOSIS — L680 Hirsutism: Secondary | ICD-10-CM

## 2022-02-20 DIAGNOSIS — E559 Vitamin D deficiency, unspecified: Secondary | ICD-10-CM

## 2022-03-20 ENCOUNTER — Telehealth: Payer: Medicare HMO | Admitting: Internal Medicine

## 2022-03-21 ENCOUNTER — Ambulatory Visit: Payer: Medicare HMO | Admitting: Nurse Practitioner

## 2022-03-25 ENCOUNTER — Ambulatory Visit: Payer: Medicare HMO | Admitting: Family Medicine

## 2022-03-29 ENCOUNTER — Ambulatory Visit: Payer: Medicare HMO | Admitting: Family Medicine

## 2022-04-17 ENCOUNTER — Ambulatory Visit: Payer: Medicare HMO | Admitting: Internal Medicine

## 2022-04-23 ENCOUNTER — Encounter: Payer: Self-pay | Admitting: Internal Medicine

## 2022-04-23 ENCOUNTER — Ambulatory Visit (INDEPENDENT_AMBULATORY_CARE_PROVIDER_SITE_OTHER): Payer: Medicare HMO | Admitting: Internal Medicine

## 2022-04-23 DIAGNOSIS — K529 Noninfective gastroenteritis and colitis, unspecified: Secondary | ICD-10-CM

## 2022-04-23 DIAGNOSIS — R109 Unspecified abdominal pain: Secondary | ICD-10-CM | POA: Diagnosis not present

## 2022-04-23 MED ORDER — DICYCLOMINE HCL 10 MG PO CAPS: 10.0000 mg | ORAL_CAPSULE | Freq: Three times a day (TID) | ORAL | 0 refills | Status: DC | PRN

## 2022-04-23 MED ORDER — ONDANSETRON HCL 4 MG PO TABS: 4.0000 mg | ORAL_TABLET | Freq: Three times a day (TID) | ORAL | 0 refills | Status: DC | PRN

## 2022-04-23 NOTE — Progress Notes (Signed)
Virtual Visit via Telephone Note   This visit type was conducted due to national recommendations for restrictions regarding the COVID-19 Pandemic (e.g. social distancing) in an effort to limit this patient's exposure and mitigate transmission in our community.  Due to her co-morbid illnesses, this patient is at least at moderate risk for complications without adequate follow up.  This format is felt to be most appropriate for this patient at this time.  The patient did not have access to video technology/had technical difficulties with video requiring transitioning to audio format only (telephone).  All issues noted in this document were discussed and addressed.  No physical exam could be performed with this format.  Evaluation Performed:  Follow-up visit  Date:  04/23/2022   ID:  Elanora, Quin 11-03-1981, MRN [id number]  Patient Location: Home Provider Location: Office/Clinic  Participants: Patient Location of Patient: Home Location of Provider: Telehealth Consent was obtain for visit to be over via telehealth. I verified that I am speaking with the correct person using two identifiers.  PCP:  Lindell Spar, MD   Chief Complaint: Diarrhea and nausea  History of Present Illness:    Monda Chastain is a 41 y.o. female who has a televisit for complaint of diarrhea and nausea for the last 3 days.  She has loose BM to watery diarrhea, but denies any melena, hematochezia, vomiting, fever or chills.  She has been eating more green vegetables recently.  She also reports abdominal cramping and bloating.  The patient does not have symptoms concerning for COVID-19 infection (fever, chills, cough, or new shortness of breath).   Past Medical, Surgical, Social History, Allergies, and Medications have been Reviewed.  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/01/2017   Osteomyelitis ([place]) 12/13/2016   Partial blindness    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   Chlorphen-PE-Acetaminophen (NOREL AD) 4-10-325 MG TABS Take 1 tablet by mouth 3 (three) times daily as needed.   diclofenac Sodium (VOLTAREN) 1 % GEL Apply 2 g topically 3 (three) times daily as needed.   dorzolamide-timolol (COSOPT) 22.3-6.8 MG/ML ophthalmic solution    famotidine (PEPCID) 20 MG tablet TAKE 1 TABLET TWICE DAILY   ferrous sulfate 324 MG TBEC Take 65 mg by mouth.  fluticasone (FLONASE) 50 MCG/ACT nasal spray Place 2 sprays into both nostrils daily.   gabapentin (NEURONTIN) 300 MG capsule Take 1 capsule (300 mg total) by mouth 2 (two) times daily. For pain   gentamicin cream (GARAMYCIN) 0.1 % Apply 1 application topically 2 (two) times daily.   insulin aspart (NOVOLOG) 100 UNIT/ML FlexPen Inject 14-20 Units into the skin 3 (three) times daily with meals.   insulin detemir (LEVEMIR FLEXTOUCH) 100 UNIT/ML FlexPen INJECT 80 UNITS SUBCUTANEOUSLY AT  BEDTIME   lisinopril-hydrochlorothiazide (ZESTORETIC) 20-12.5 MG tablet Take 1 tablet by mouth once daily   loperamide (IMODIUM) 2 MG capsule TAKE 1 CAPSULE BY MOUTH EVERY 6 HOURS   mupirocin ointment (BACTROBAN) 2 % Apply 1 application topically 2 (two) times daily.   olopatadine (PATANOL) 0.1 % ophthalmic solution Place 1 drop into both eyes 2 times daily.   Probiotic Product (BACID) CAPS Take 1 capsule by mouth in the morning and at bedtime.   sodium chloride (OCEAN) 0.65 % SOLN nasal spray Place 1 spray into both nostrils as needed for congestion.   spironolactone (ALDACTONE) 50 MG tablet TAKE 1 TABLET TWICE DAILY   Vitamin D, Ergocalciferol, (DRISDOL) 1.25 MG (50000 UNIT) CAPS capsule TAKE 1 CAPSULE EVERY WEEK     Allergies:   Other, Shellfish allergy, Oxycodone, and Percocet [oxycodone-acetaminophen]   ROS:   Please see the history of present illness.     All other systems reviewed and are negative.   Labs/Other Tests and Data Reviewed:    Recent Labs: No results found for requested labs within last 8760 hours.   Recent Lipid Panel Lab Results  Component Value Date/Time   CHOL 129 01/30/2021 11:34 AM   TRIG 95 01/30/2021 11:34 AM   HDL 53 01/30/2021 11:34 AM   CHOLHDL 2.4 01/30/2021 11:34 AM   CHOLHDL 3.9 01/26/2020 02:47 PM   LDLCALC 58 01/30/2021 11:34 AM   LDLCALC 134 (H) 01/26/2020 02:47 PM    Wt Readings from Last 3 Encounters:  08/14/21 (!) 427 lb 0.6 oz (193.7 kg)  01/30/21 (!) 428 lb 6.4 oz (194.3 kg)  11/10/20 (!) 428 lb (194.1 kg)     ASSESSMENT & PLAN:    Acute gastroenteritis Most of the gastroenteritis are self resolving Advised to maintain adequate hydration Zofran as needed for nausea Bentyl as needed for abdominal cramping Advised to go to ER if she is not able to tolerate p.o. intake  Time:   Today, I have spent 9 minutes reviewing the chart, including problem list, medications, and with the patient with telehealth technology discussing the  above problems.   Medication Adjustments/Labs and Tests Ordered: Current medicines are reviewed at length with the patient today.  Concerns regarding medicines are outlined above.   Tests Ordered: No orders of the defined types were placed in this encounter.   Medication Changes: No orders of the defined types were placed in this encounter.    Note: This dictation was prepared with Dragon dictation along with smaller phrase technology. Similar sounding words can be transcribed inadequately or may not be corrected upon review. Any transcriptional errors that result from this process are unintentional.      Disposition:  Follow up  Signed, Lindell Spar, MD  04/23/2022 1:58 PM     [facility]

## 2022-04-23 NOTE — Patient Instructions (Signed)
Please take Zofran as needed for nausea.  Please take Bentyl as needed for abdominal cramping.

## 2022-04-26 ENCOUNTER — Ambulatory Visit: Payer: Medicare HMO | Admitting: Nurse Practitioner

## 2022-04-26 ENCOUNTER — Telehealth: Payer: Self-pay | Admitting: Internal Medicine

## 2022-04-26 NOTE — Telephone Encounter (Signed)
PT called stating she is still having stomach issues & is wanting to know what she can do?

## 2022-05-09 ENCOUNTER — Encounter: Payer: Self-pay | Admitting: Internal Medicine

## 2022-05-09 ENCOUNTER — Ambulatory Visit (INDEPENDENT_AMBULATORY_CARE_PROVIDER_SITE_OTHER): Payer: Medicare HMO | Admitting: Internal Medicine

## 2022-05-09 VITALS — BP 140/86 | HR 87 | Resp 18 | Ht 67.0 in | Wt >= 6400 oz

## 2022-05-09 DIAGNOSIS — E1165 Type 2 diabetes mellitus with hyperglycemia: Secondary | ICD-10-CM

## 2022-05-09 DIAGNOSIS — I10 Essential (primary) hypertension: Secondary | ICD-10-CM | POA: Diagnosis not present

## 2022-05-09 DIAGNOSIS — I5032 Chronic diastolic (congestive) heart failure: Secondary | ICD-10-CM

## 2022-05-09 DIAGNOSIS — R1903 Right lower quadrant abdominal swelling, mass and lump: Secondary | ICD-10-CM | POA: Insufficient documentation

## 2022-05-09 DIAGNOSIS — G4733 Obstructive sleep apnea (adult) (pediatric): Secondary | ICD-10-CM | POA: Insufficient documentation

## 2022-05-09 DIAGNOSIS — Z794 Long term (current) use of insulin: Secondary | ICD-10-CM

## 2022-05-09 DIAGNOSIS — R079 Chest pain, unspecified: Secondary | ICD-10-CM | POA: Diagnosis not present

## 2022-05-09 DIAGNOSIS — E782 Mixed hyperlipidemia: Secondary | ICD-10-CM

## 2022-05-09 DIAGNOSIS — K219 Gastro-esophageal reflux disease without esophagitis: Secondary | ICD-10-CM

## 2022-05-09 MED ORDER — OMEPRAZOLE 40 MG PO CPDR: 40.0000 mg | DELAYED_RELEASE_CAPSULE | Freq: Every day | ORAL | 5 refills | Status: DC

## 2022-05-09 NOTE — Assessment & Plan Note (Addendum)
Has resting chest pain, due to her history of type II DM, HTN and morbid obesity, at risk of CAD EKG: Sinus rhythm.  No signs of active ischemia. Referred to cardiology

## 2022-05-09 NOTE — Assessment & Plan Note (Signed)
BP Readings from Last 1 Encounters:  05/09/22 140/86   Uncontrolled with Lisinopril-HCTZ and Spironolactone Could be a component of pain and OSA/OHS, had referred for sleep study, but has not set up appointment yet Counseled for compliance with the medications Advised DASH diet and moderate exercise/walking, at least 150 mins/week

## 2022-05-09 NOTE — Assessment & Plan Note (Signed)
Had tried to refer her to sleep specialist, but she could not follow up Will try home sleep study

## 2022-05-09 NOTE — Assessment & Plan Note (Signed)
On Lipitor 

## 2022-05-09 NOTE — Assessment & Plan Note (Signed)
Started Omeprazole Some of GI symptoms could be due to diabetic gastropathy. If persistent symptoms, can give trial of Reglan.

## 2022-05-09 NOTE — Progress Notes (Signed)
Established Patient Office Visit  Subjective:  Patient ID: Rachel George, female    DOB: 22-Oct-1981  Age: 41 y.o. MRN: [id number]  CC:  Chief Complaint  Patient presents with   Follow-up    Follow up pt cant sleep at night feels like pain in chest coughing feels like fluid had sleep study in 18 but never had results     HPI Rachel George is a 41 y.o. female with past medical history of HTN, uncontrolled DM with polyneuropathy and retinopathy, HLD, GERD and morbid obesity who presents for f/u of her chronic medical conditions.  She complains of episode of chest pain in the last week, which lasted for few mins.  She has chronic dyspnea, especially while lying down, which is partly due to OSA and could be due to HFpEF.  She also reports chronic leg swelling.  Of note, she also reports acid reflux and has run out of Pepcid.  She also reports having been RLQ abdominal mass, which is hard to touch.  She denies any abdominal pain, but has noticed a change in her bowel habits, having loose BM at times.  She denies any melena, hematochezia, vaginal bleeding or discharge currently.   HTN: Her blood pressure was slightly elevated in the office today.  She has been taking lisinopril HCTZ and spironolactone.  Compliance is questionable.  Of note, she likely has OSA/OHS, for which she was referred to sleep specialist, but has not set up appointment yet.  She denies any chest pain, or palpitations.   DM: Her HbA1c was 11.5 in 09/22.  She has been taking Levemir 60 units and NovoLog as per sliding scale.  She has not set up appointment with Dr. Dorris Fetch yet since last visit.  She states that she has been following low-carb diet.  Denies any polyuria or polyphagia currently.   Past Medical History:  Diagnosis Date   Anemia    Cataract    Cellulitis of toe of right foot 10/07/2016   Depression    Diabetes mellitus without complication [facility])    dx age 55   Diabetic foot ulcer ([place])  10/15/2016   Fatty liver disease, nonalcoholic    Glaucoma    Hyperlipidemia    Hypertension    Macular degeneration    Morbid obesity with BMI of 50.0-59.9, adult ([place]) 05/01/2017   Neuropathy    Obesity, Class III, BMI 40-49.9 (morbid obesity) ([place]) 05/01/2017   Osteolysis, right ankle and foot 05/01/2017   Osteomyelitis ([place]) 12/13/2016   Partial blindness    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   Suicidal ideation 11/04/2017    Past Surgical History:  Procedure Laterality Date   AMPUTATION TOE Right 12/16/2016   Procedure: RIGHT FIRST TOE AMPUTATION;  Surgeon: Vickie Epley, MD;  Location: [facility];  Service: General;  Laterality: Right;  Right first toe amputation for osteomyelitis   eye lid transplant     RETINAL LASER PROCEDURE      Family History  Problem Relation Age of Onset   Diabetes Mother    Thyroid disease Mother    Diabetes Father     Social History   Socioeconomic History   Marital status: Legally Separated    Spouse name: Sarit Sparano   Number of children: 0   Years of education: Not on file   Highest education level: High school graduate  Occupational History   Occupation: CNA personal care giver  Tobacco Use   Smoking status: Former  Packs/day: 0.25    Types: Cigarettes    Quit date: 12/11/2016    Years since quitting: 5.4   Smokeless tobacco: Never  Vaping Use   Vaping Use: Never used  Substance and Sexual Activity   Alcohol use: No   Drug use: No   Sexual activity: Yes    Partners: Male    Birth control/protection: None  Other Topics Concern   Not on file  Social History Narrative   Separated from husband-married for 7 years      Lives alone right now, in a disability apartment.   Right handed      Enjoys walking       Diet: eat one meal a day- veggies, meats, low carb    Caffeine: tea-4 cups daily    Water: 4-6 bottles of water      Wears seat belt   Smoke detectors    Does not use phone while driving   Social  Determinants of Health   Financial Resource Strain: Medium Risk (11/19/2021)   Overall Financial Resource Strain (CARDIA)    Difficulty of Paying Living Expenses: Somewhat hard  Food Insecurity: Food Insecurity Present (11/19/2021)   Hunger Vital Sign    Worried About Running Out of Food in the Last Year: Often true    Ran Out of Food in the Last Year: Often true  Transportation Needs: No Transportation Needs (11/19/2021)   PRAPARE - Hydrologist (Medical): No    Lack of Transportation (Non-Medical): No  Physical Activity: Sufficiently Active (11/19/2021)   Exercise Vital Sign    Days of Exercise per Week: 4 days    Minutes of Exercise per Session: 60 min  Stress: Not on file  Social Connections: Socially Isolated (11/19/2021)   Social Connection and Isolation Panel [NHANES]    Frequency of Communication with Friends and Family: Three times a week    Frequency of Social Gatherings with Friends and Family: Twice a week    Attends Religious Services: Never    Marine scientist or Organizations: No    Attends Music therapist: Never    Marital Status: Never married  Human resources officer Violence: Not on file    Outpatient Medications Prior to Visit  Medication Sig Dispense Refill   atorvastatin (LIPITOR) 20 MG tablet TAKE 1 TABLET BY MOUTH ONCE DAILY 6 IN THE EVENING 90 tablet 0   Blood Glucose Monitoring Suppl (ACCU-CHEK GUIDE ME) w/Device KIT USE TESTING SUPPLIES TO TEST 4 TIMES DAILY. 1 kit 0   Chlorphen-PE-Acetaminophen (NOREL AD) 4-10-325 MG TABS Take 1 tablet by mouth 3 (three) times daily as needed. 30 tablet 0   diclofenac Sodium (VOLTAREN) 1 % GEL Apply 2 g topically 3 (three) times daily as needed. 50 g 0   dicyclomine (BENTYL) 10 MG capsule Take 1 capsule (10 mg total) by mouth 3 (three) times daily as needed for spasms. 30 capsule 0   dorzolamide-timolol (COSOPT) 22.3-6.8 MG/ML ophthalmic solution      ferrous sulfate 324 MG TBEC  Take 65 mg by mouth.     fluticasone (FLONASE) 50 MCG/ACT nasal spray Place 2 sprays into both nostrils daily. 16 g 6   gabapentin (NEURONTIN) 300 MG capsule Take 1 capsule (300 mg total) by mouth 2 (two) times daily. For pain 60 capsule 5   gentamicin cream (GARAMYCIN) 0.1 % Apply 1 application topically 2 (two) times daily. 30 g 1   insulin aspart (NOVOLOG) 100 UNIT/ML FlexPen Inject  14-20 Units into the skin 3 (three) times daily with meals. 15 mL 11   insulin detemir (LEVEMIR FLEXTOUCH) 100 UNIT/ML FlexPen INJECT 80 UNITS SUBCUTANEOUSLY AT BEDTIME 30 mL 5   lisinopril-hydrochlorothiazide (ZESTORETIC) 20-12.5 MG tablet Take 1 tablet by mouth once daily 90 tablet 0   loperamide (IMODIUM) 2 MG capsule TAKE 1 CAPSULE BY MOUTH EVERY 6 HOURS 30 capsule 0   mupirocin ointment (BACTROBAN) 2 % Apply 1 application topically 2 (two) times daily. 22 g 0   olopatadine (PATANOL) 0.1 % ophthalmic solution Place 1 drop into both eyes 2 times daily.     ondansetron (ZOFRAN) 4 MG tablet Take 1 tablet (4 mg total) by mouth every 8 (eight) hours as needed for nausea or vomiting. 20 tablet 0   Probiotic Product (BACID) CAPS Take 1 capsule by mouth in the morning and at bedtime. 30 capsule 2   sodium chloride (OCEAN) 0.65 % SOLN nasal spray Place 1 spray into both nostrils as needed for congestion. 30 mL 0   spironolactone (ALDACTONE) 50 MG tablet TAKE 1 TABLET TWICE DAILY 180 tablet 0   Vitamin D, Ergocalciferol, (DRISDOL) 1.25 MG (50000 UNIT) CAPS capsule TAKE 1 CAPSULE EVERY WEEK 12 capsule 0   famotidine (PEPCID) 20 MG tablet TAKE 1 TABLET TWICE DAILY 180 tablet 1   No facility-administered medications prior to visit.    Allergies  Allergen Reactions   Other Anaphylaxis   Shellfish Allergy Anaphylaxis, Shortness Of Breath and Swelling    Facial Swelling   Oxycodone Nausea And Vomiting   Percocet [Oxycodone-Acetaminophen] Itching and Nausea And Vomiting    ROS Review of Systems  Constitutional:   Positive for fatigue. Negative for chills and fever.  HENT:  Negative for congestion, sinus pressure, sinus pain and sore throat.   Eyes:  Positive for visual disturbance. Negative for pain and discharge.  Respiratory:  Positive for shortness of breath. Negative for cough.   Cardiovascular:  Positive for chest pain. Negative for palpitations.  Gastrointestinal:  Positive for abdominal distention. Negative for abdominal pain, nausea and vomiting.  Endocrine: Negative for polydipsia and polyuria.  Genitourinary:  Negative for dysuria and hematuria.  Musculoskeletal:  Negative for neck pain and neck stiffness.  Skin:  Positive for rash.  Neurological:  Negative for dizziness and weakness.  Psychiatric/Behavioral:  Positive for sleep disturbance. Negative for agitation and behavioral problems.       Objective:    Physical Exam Vitals reviewed.  Constitutional:      General: She is not in acute distress.    Appearance: She is obese. She is not diaphoretic.  HENT:     Head: Normocephalic and atraumatic.     Nose: Nose normal. No congestion.     Mouth/Throat:     Mouth: Mucous membranes are moist.     Pharynx: No posterior oropharyngeal erythema.  Eyes:     General: No scleral icterus. Cardiovascular:     Rate and Rhythm: Normal rate and regular rhythm.     Heart sounds: Normal heart sounds. No murmur heard.    Comments: DPA 1+ b/l Pulmonary:     Breath sounds: No wheezing or rales.     Comments: Decreased breath sounds in lower lung fields Abdominal:     Palpations: Abdomen is soft. There is mass (RLQ and right pelvic mass, hard, nontender).     Tenderness: There is no abdominal tenderness.  Musculoskeletal:     Cervical back: Neck supple. No tenderness.     Comments: S/p  amputation of great toe - right LE  Skin:    General: Skin is warm.  Neurological:     General: No focal deficit present.     Mental Status: She is alert and oriented to person, place, and time.   Psychiatric:        Mood and Affect: Mood normal.        Behavior: Behavior normal.     BP 140/86 (BP Location: Left Arm, Patient Position: Sitting, Cuff Size: Normal)   Pulse 87   Resp 18   Ht $R'5\' 7"'xW$  (1.702 m)   Wt (!) 439 lb 12.8 oz (199.5 kg)   SpO2 98%   BMI 68.88 kg/m  Wt Readings from Last 3 Encounters:  05/09/22 (!) 439 lb 12.8 oz (199.5 kg)  08/14/21 (!) 427 lb 0.6 oz (193.7 kg)  01/30/21 (!) 428 lb 6.4 oz (194.3 kg)    Lab Results  Component Value Date   TSH 0.88 01/26/2020   Lab Results  Component Value Date   WBC 8.3 01/30/2021   HGB 10.8 (L) 01/30/2021   HCT 33.3 (L) 01/30/2021   MCV 80 01/30/2021   PLT 244 01/30/2021   Lab Results  Component Value Date   NA 146 (H) 01/30/2021   K 4.6 01/30/2021   CO2 17 (L) 01/30/2021   GLUCOSE 123 (H) 01/30/2021   BUN 15 01/30/2021   CREATININE 1.11 (H) 01/30/2021   BILITOT 0.3 07/13/2020   ALKPHOS 86 07/13/2020   AST 10 07/13/2020   ALT 7 07/13/2020   PROT 6.5 07/13/2020   ALBUMIN 3.5 (L) 07/13/2020   CALCIUM 9.3 01/30/2021   ANIONGAP 12 07/22/2019   EGFR 64 01/30/2021   Lab Results  Component Value Date   CHOL 129 01/30/2021   Lab Results  Component Value Date   HDL 53 01/30/2021   Lab Results  Component Value Date   LDLCALC 58 01/30/2021   Lab Results  Component Value Date   TRIG 95 01/30/2021   Lab Results  Component Value Date   CHOLHDL 2.4 01/30/2021   Lab Results  Component Value Date   HGBA1C 11.5 (A) 08/14/2021      Assessment & Plan:   Problem List Items Addressed This Visit       Cardiovascular and Mediastinum   Essential hypertension, benign    BP Readings from Last 1 Encounters:  05/09/22 140/86  Uncontrolled with Lisinopril-HCTZ and Spironolactone Could be a component of pain and OSA/OHS, had referred for sleep study, but has not set up appointment yet Counseled for compliance with the medications Advised DASH diet and moderate exercise/walking, at least 150  mins/week      Chronic heart failure with preserved ejection fraction (HFpEF) (HCC)    Her leg swelling and orthopnea likely due to CHF Referred to cardiology for echo -also has episodes of chest pain, due to her history of type II DM and HTN with morbid obesity, at risk for CAD Check CMP and BNP      Relevant Orders   Ambulatory referral to Cardiology   TSH   B Nat Peptide     Respiratory   OSA (obstructive sleep apnea)    Had tried to refer her to sleep specialist, but she could not follow up Will try home sleep study       Relevant Orders   Home sleep test     Digestive   Gastroesophageal reflux disease    Started Omeprazole Some of GI symptoms could be due  to diabetic gastropathy. If persistent symptoms, can give trial of Reglan.      Relevant Medications   omeprazole (PRILOSEC) 40 MG capsule     Endocrine   Uncontrolled type 2 diabetes mellitus with hyperglycemia, with long-term current use of insulin (HCC)    Lab Results  Component Value Date   HGBA1C 11.5 (A) 08/14/2021  On Levemir 80 U QD and Novolog sliding scale Has not brought blood glucose log, states it stays around 120, with only few readings above 200 Saw Dr Dorris Fetch in the past, referral sent again  Advised to follow diabetic diet On ACE and statin F/u CMP and lipid panel Diabetic eye exam: Advised to follow up with Ophthalmology for diabetic eye exam      Relevant Orders   Hemoglobin A1c   CMP14+EGFR   CBC with Differential/Platelet   Ambulatory referral to Endocrinology     Other   Mixed hyperlipidemia    On Lipitor      Relevant Orders   Lipid panel   RLQ abdominal mass    Has a large, hard to touch, nontender RLQ and pelvic mass Concern for ovarian/abdominal mass Check CT abdomen pelvis with contrast      Relevant Orders   CT Abdomen Pelvis W Contrast   Chest pain - Primary    Has resting chest pain, due to her history of type II DM, HTN and morbid obesity, at risk of CAD EKG:  Sinus rhythm.  No signs of active ischemia. Referred to cardiology      Relevant Orders   EKG 12-Lead (Completed)   Ambulatory referral to Cardiology   TSH    Meds ordered this encounter  Medications   omeprazole (PRILOSEC) 40 MG capsule    Sig: Take 1 capsule (40 mg total) by mouth daily.    Dispense:  30 capsule    Refill:  5    Follow-up: Return in about 4 weeks (around 06/06/2022) for Annual physical.    Lindell Spar, MD

## 2022-05-09 NOTE — Assessment & Plan Note (Signed)
Has a large, hard to touch, nontender RLQ and pelvic mass Concern for ovarian/abdominal mass Check CT abdomen pelvis with contrast

## 2022-05-09 NOTE — Assessment & Plan Note (Addendum)
Her leg swelling and orthopnea likely due to CHF Referred to cardiology for echo -also has episodes of chest pain, due to her history of type II DM and HTN with morbid obesity, at risk for CAD Check CMP and BNP

## 2022-05-09 NOTE — Assessment & Plan Note (Signed)
Lab Results  Component Value Date   HGBA1C 11.5 (A) 08/14/2021   On Levemir 80 U QD and Novolog sliding scale Has not brought blood glucose log, states it stays around 120, with only few readings above 200 Saw Dr Fransico Him in the past, referral sent again  Advised to follow diabetic diet On ACE and statin F/u CMP and lipid panel Diabetic eye exam: Advised to follow up with Ophthalmology for diabetic eye exam

## 2022-05-09 NOTE — Patient Instructions (Signed)
Please start taking Omeprazole instead of Pepcid for acid reflux.  Please continue taking other medications as prescribed.  Please follow low salt and low carb diet and ambulate as tolerated.  If you have chest pain with shortness of breath or palpitations, please go to ER.  You are being scheduled to get CT abdomen and pelvis.  You are being scheduled to get home sleep study.

## 2022-05-13 ENCOUNTER — Telehealth: Payer: Self-pay | Admitting: Internal Medicine

## 2022-05-13 NOTE — Telephone Encounter (Signed)
Rachel George with pre service center at [facility] called in on patient behalf   Patient is scheduled for CT abdomin pelvis on 6/14  [facility] insurance requires pre auth.  Call back  info   Rachel George [phone] ext [phone]

## 2022-05-14 NOTE — Telephone Encounter (Signed)
Called [employer] to get procedure approved, Authorization number is [id number], returned call to [facility] and gave her the auth number.

## 2022-05-15 ENCOUNTER — Ambulatory Visit (HOSPITAL_COMMUNITY): Payer: Medicare HMO

## 2022-05-17 ENCOUNTER — Other Ambulatory Visit: Payer: Self-pay | Admitting: Internal Medicine

## 2022-05-17 DIAGNOSIS — R195 Other fecal abnormalities: Secondary | ICD-10-CM

## 2022-05-17 DIAGNOSIS — I10 Essential (primary) hypertension: Secondary | ICD-10-CM

## 2022-05-20 ENCOUNTER — Other Ambulatory Visit: Payer: Self-pay

## 2022-05-20 ENCOUNTER — Telehealth: Payer: Self-pay

## 2022-05-20 DIAGNOSIS — E782 Mixed hyperlipidemia: Secondary | ICD-10-CM | POA: Diagnosis not present

## 2022-05-20 DIAGNOSIS — E1165 Type 2 diabetes mellitus with hyperglycemia: Secondary | ICD-10-CM | POA: Diagnosis not present

## 2022-05-20 DIAGNOSIS — R079 Chest pain, unspecified: Secondary | ICD-10-CM | POA: Diagnosis not present

## 2022-05-20 DIAGNOSIS — Z794 Long term (current) use of insulin: Secondary | ICD-10-CM | POA: Diagnosis not present

## 2022-05-20 DIAGNOSIS — I10 Essential (primary) hypertension: Secondary | ICD-10-CM

## 2022-05-20 DIAGNOSIS — I5032 Chronic diastolic (congestive) heart failure: Secondary | ICD-10-CM | POA: Diagnosis not present

## 2022-05-20 MED ORDER — LISINOPRIL-HYDROCHLOROTHIAZIDE 20-12.5 MG PO TABS: 1.0000 | ORAL_TABLET | Freq: Every day | ORAL | 0 refills | Status: DC

## 2022-05-20 NOTE — Telephone Encounter (Signed)
Refill sent.

## 2022-05-20 NOTE — Telephone Encounter (Signed)
Patient needs med refill, patient has ran out last night.  lisinopril-hydrochlorothiazide (ZESTORETIC) 20-12.5 MG tablet   Pharmacy: [facility]

## 2022-05-22 ENCOUNTER — Telehealth: Payer: Self-pay

## 2022-05-22 LAB — CBC WITH DIFFERENTIAL/PLATELET
Basophils Absolute: 0.1 10*3/uL (ref 0.0–0.2)
Basos: 1 %
EOS (ABSOLUTE): 0.1 10*3/uL (ref 0.0–0.4)
Eos: 1 %
Hematocrit: 34.9 % (ref 34.0–46.6)
Hemoglobin: 11 g/dL — ABNORMAL LOW (ref 11.1–15.9)
Immature Grans (Abs): 0 10*3/uL (ref 0.0–0.1)
Immature Granulocytes: 0 %
Lymphocytes Absolute: 6.1 10*3/uL — ABNORMAL HIGH (ref 0.7–3.1)
Lymphs: 70 %
MCH: 25.4 pg — ABNORMAL LOW (ref 26.6–33.0)
MCHC: 31.5 g/dL (ref 31.5–35.7)
MCV: 81 fL (ref 79–97)
Monocytes Absolute: 0.5 10*3/uL (ref 0.1–0.9)
Monocytes: 6 %
Neutrophils Absolute: 2 10*3/uL (ref 1.4–7.0)
Neutrophils: 22 %
Platelets: 222 10*3/uL (ref 150–450)
RBC: 4.33 x10E6/uL (ref 3.77–5.28)
RDW: 13.5 % (ref 11.7–15.4)
WBC: 8.7 10*3/uL (ref 3.4–10.8)

## 2022-05-22 LAB — CMP14+EGFR
ALT: 10 IU/L (ref 0–32)
AST: 14 IU/L (ref 0–40)
Albumin/Globulin Ratio: 1.1 — ABNORMAL LOW (ref 1.2–2.2)
Albumin: 3.7 g/dL — ABNORMAL LOW (ref 3.8–4.8)
Alkaline Phosphatase: 92 IU/L (ref 44–121)
BUN/Creatinine Ratio: 18 (ref 9–23)
BUN: 26 mg/dL — ABNORMAL HIGH (ref 6–24)
Bilirubin Total: 0.2 mg/dL (ref 0.0–1.2)
CO2: 20 mmol/L (ref 20–29)
Calcium: 9 mg/dL (ref 8.7–10.2)
Chloride: 107 mmol/L — ABNORMAL HIGH (ref 96–106)
Creatinine, Ser: 1.45 mg/dL — ABNORMAL HIGH (ref 0.57–1.00)
Globulin, Total: 3.3 g/dL (ref 1.5–4.5)
Glucose: 151 mg/dL — ABNORMAL HIGH (ref 70–99)
Potassium: 5 mmol/L (ref 3.5–5.2)
Sodium: 139 mmol/L (ref 134–144)
Total Protein: 7 g/dL (ref 6.0–8.5)
eGFR: 46 mL/min/{1.73_m2} — ABNORMAL LOW (ref >59)

## 2022-05-22 LAB — LIPID PANEL
Chol/HDL Ratio: 2.7 ratio (ref 0.0–4.4)
Cholesterol, Total: 116 mg/dL (ref 100–199)
HDL: 43 mg/dL (ref >39)
LDL Chol Calc (NIH): 50 mg/dL (ref 0–99)
Triglycerides: 128 mg/dL (ref 0–149)
VLDL Cholesterol Cal: 23 mg/dL (ref 5–40)

## 2022-05-22 LAB — HEMOGLOBIN A1C
Est. average glucose Bld gHb Est-mCnc: 223 mg/dL
Hgb A1c MFr Bld: 9.4 % — ABNORMAL HIGH (ref 4.8–5.6)

## 2022-05-22 LAB — BRAIN NATRIURETIC PEPTIDE: BNP: 6.8 pg/mL (ref 0.0–100.0)

## 2022-05-22 LAB — TSH: TSH: 1.42 u[IU]/mL (ref 0.450–4.500)

## 2022-05-22 NOTE — Telephone Encounter (Signed)
Patient returning lab result call 

## 2022-05-22 NOTE — Telephone Encounter (Signed)
Pt advised with verbal understanding  °

## 2022-05-25 ENCOUNTER — Other Ambulatory Visit: Payer: Self-pay | Admitting: Internal Medicine

## 2022-05-27 ENCOUNTER — Ambulatory Visit (HOSPITAL_COMMUNITY)
Admission: RE | Admit: 2022-05-27 | Discharge: 2022-05-27 | Disposition: A | Discharge status: Home / Self Care | Payer: Medicare HMO | Source: Ambulatory Visit | Attending: Internal Medicine | Admitting: Internal Medicine

## 2022-05-27 DIAGNOSIS — R590 Localized enlarged lymph nodes: Secondary | ICD-10-CM | POA: Diagnosis not present

## 2022-05-27 DIAGNOSIS — R1903 Right lower quadrant abdominal swelling, mass and lump: Secondary | ICD-10-CM | POA: Diagnosis not present

## 2022-05-27 MED ORDER — IOHEXOL 300 MG/ML  SOLN
100.0000 mL | Freq: Once | INTRAMUSCULAR | Status: AC | PRN
  Administered 2022-05-27: 100 mL via INTRAVENOUS

## 2022-06-05 ENCOUNTER — Encounter: Payer: Self-pay | Admitting: Internal Medicine

## 2022-06-05 ENCOUNTER — Ambulatory Visit (INDEPENDENT_AMBULATORY_CARE_PROVIDER_SITE_OTHER): Payer: Medicare HMO | Admitting: Internal Medicine

## 2022-06-05 ENCOUNTER — Telehealth: Payer: Self-pay | Admitting: Internal Medicine

## 2022-06-05 DIAGNOSIS — K047 Periapical abscess without sinus: Secondary | ICD-10-CM | POA: Diagnosis not present

## 2022-06-05 MED ORDER — AMOXICILLIN-POT CLAVULANATE 875-125 MG PO TABS: 1.0000 | ORAL_TABLET | Freq: Two times a day (BID) | ORAL | 0 refills | Status: DC

## 2022-06-05 NOTE — Progress Notes (Signed)
Virtual Visit via Telephone Note   This visit type was conducted due to national recommendations for restrictions regarding the COVID-19 Pandemic (e.g. social distancing) in an effort to limit this patient's exposure and mitigate transmission in our community.  Due to her co-morbid illnesses, this patient is at least at moderate risk for complications without adequate follow up.  This format is felt to be most appropriate for this patient at this time.  The patient did not have access to video technology/had technical difficulties with video requiring transitioning to audio format only (telephone).  All issues noted in this document were discussed and addressed.  No physical exam could be performed with this format.  Evaluation Performed:  Follow-up visit  Date:  06/05/2022   ID:  Rachel, George [birth year], MRN [id number]  Patient Location: Home Provider Location: Office/Clinic  Participants: Patient Location of Patient: Home Location of Provider: Telehealth Consent was obtain for visit to be over via telehealth. I verified that I am speaking with the correct person using two identifiers.  PCP:  Lindell Spar, MD   Chief Complaint: Tooth abscess  History of Present Illness:    Rachel George is a 41 y.o. female who has a televisit for complaint of pain and swelling over left upper molar teeth.  She denies any fever, chills currently.  She has tried to contact her dentist, but her dentist does not offer dental extraction and she is trying to find a new dentist.  The patient does not have symptoms concerning for COVID-19 infection (fever, chills, cough, or new shortness of breath).   Past Medical, Surgical, Social History, Allergies, and Medications have been Reviewed.  Past Medical History:  Diagnosis Date   Anemia    Cataract    Cellulitis of toe of right foot 10/07/2016   Depression    Diabetes mellitus without complication [facility])    dx age 12    Diabetic foot ulcer ([place]) 10/15/2016   Fatty liver disease, nonalcoholic    Glaucoma    Hyperlipidemia    Hypertension    Macular degeneration    Morbid obesity with BMI of 50.0-59.9, adult ([place]) 05/01/2017   Neuropathy    Obesity, Class III, BMI 40-49.9 (morbid obesity) ([place]) 05/01/2017   Osteolysis, right ankle and foot 05/01/2017   Osteomyelitis ([place]) 12/13/2016   Partial blindness    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   Chlorphen-PE-Acetaminophen (NOREL AD) 4-10-325 MG TABS Take 1 tablet by mouth 3 (three) times daily as needed.   diclofenac Sodium (VOLTAREN) 1 % GEL Apply 2 g topically 3 (three) times daily as needed.   dicyclomine (BENTYL) 10 MG capsule Take 1 capsule (10 mg total) by mouth 3 (three) times daily as needed for spasms.   dorzolamide-timolol (COSOPT) 22.3-6.8 MG/ML ophthalmic solution    ferrous sulfate 324  MG TBEC Take 65 mg by mouth.   fluticasone (FLONASE) 50 MCG/ACT nasal spray Place 2 sprays into both nostrils daily.   gabapentin (NEURONTIN) 300 MG capsule Take 1 capsule (300 mg total) by mouth 2 (two) times daily. For pain   gentamicin cream (GARAMYCIN) 0.1 % Apply 1 application topically 2 (two) times daily.   insulin aspart (NOVOLOG) 100 UNIT/ML FlexPen Inject 14-20 Units into the skin 3 (three) times daily with meals.   insulin detemir (LEVEMIR FLEXPEN) 100 UNIT/ML FlexPen INJECT 80  UNITS UNDER THE SKIN AT BEDTIME   lisinopril-hydrochlorothiazide (ZESTORETIC) 20-12.5 MG tablet Take 1 tablet by mouth daily.   loperamide (IMODIUM) 2 MG capsule TAKE 1 CAPSULE BY MOUTH EVERY 6 HOURS   mupirocin ointment (BACTROBAN) 2 % Apply 1 application topically 2 (two) times daily.   olopatadine (PATANOL) 0.1 % ophthalmic solution Place 1 drop into both eyes 2 times daily.   omeprazole (PRILOSEC) 40 MG capsule Take 1 capsule (40 mg total) by mouth daily.   ondansetron (ZOFRAN) 4 MG tablet Take 1 tablet (4 mg total) by mouth every 8 (eight) hours as needed for nausea or vomiting.   Probiotic Product (BACID) CAPS Take 1 capsule by mouth in the morning and at bedtime.   sodium chloride (OCEAN) 0.65 % SOLN nasal spray Place 1 spray into both nostrils as needed for congestion.   spironolactone (ALDACTONE) 50 MG tablet TAKE 1 TABLET TWICE DAILY   Vitamin D, Ergocalciferol, (DRISDOL) 1.25 MG (50000 UNIT) CAPS capsule TAKE 1 CAPSULE EVERY WEEK     Allergies:   Other, Shellfish allergy, Oxycodone, and Percocet [oxycodone-acetaminophen]   ROS:   Please see the history of present illness.     All other systems reviewed and are negative.   Labs/Other Tests and Data Reviewed:    Recent Labs: 05/20/2022: ALT 10; BNP 6.8; BUN 26; Creatinine, Ser 1.45; Hemoglobin 11.0; Platelets 222; Potassium 5.0; Sodium 139; TSH 1.420   Recent Lipid Panel Lab Results  Component Value Date/Time   CHOL 116 05/20/2022 08:36 AM   TRIG 128 05/20/2022 08:36 AM   HDL 43 05/20/2022 08:36 AM   CHOLHDL 2.7 05/20/2022 08:36 AM   CHOLHDL 3.9 01/26/2020 02:47 PM   LDLCALC 50 05/20/2022 08:36 AM   LDLCALC 134 (H) 01/26/2020 02:47 PM    Wt Readings from Last 3 Encounters:  05/09/22 (!) 439 lb 12.8 oz (199.5 kg)  08/14/21 (!) 427 lb 0.6 oz (193.7 kg)  01/30/21 (!) 428 lb 6.4 oz (194.3 kg)     ASSESSMENT & PLAN:    Tooth abscess Needs urgent dental evaluation Augmentin for now for anaerobic bacterial  coverage Maintain oral hygiene  Time:   Today, I have spent 8 minutes reviewing the chart, including problem list, medications, and with the patient with telehealth technology discussing the above problems.   Medication Adjustments/Labs and Tests Ordered: Current medicines are reviewed at length with the patient today.  Concerns regarding medicines are outlined above.   Tests Ordered: No orders of the defined types were placed in this encounter.   Medication Changes: No orders of the defined types were placed in this encounter.    Note: This dictation was prepared with Dragon dictation along with smaller phrase technology. Similar sounding words can be transcribed inadequately or may not be corrected upon review. Any transcriptional errors that result from this process are unintentional.      Disposition:  Follow up  Signed, Lindell Spar, MD  06/05/2022 3:41 PM  [facility]

## 2022-06-05 NOTE — Telephone Encounter (Signed)
Pt called stating that she has an infected impacted tooth & is unable to find a dentist that will except her. She is wanting to know if she can please get something for this till she can find a dentist?  States it has been going on since the weekend.

## 2022-06-05 NOTE — Telephone Encounter (Signed)
Patient needs an appointment

## 2022-06-06 ENCOUNTER — Telehealth: Payer: Medicare HMO | Admitting: Internal Medicine

## 2022-06-10 ENCOUNTER — Ambulatory Visit: Payer: Medicare HMO | Admitting: "Endocrinology

## 2022-06-17 ENCOUNTER — Ambulatory Visit: Payer: Medicare HMO | Admitting: Podiatry

## 2022-06-18 ENCOUNTER — Encounter: Payer: Medicare HMO | Admitting: Internal Medicine

## 2022-06-19 ENCOUNTER — Encounter: Payer: Self-pay | Admitting: Internal Medicine

## 2022-06-27 ENCOUNTER — Ambulatory Visit: Payer: Medicare HMO | Admitting: "Endocrinology

## 2022-07-03 ENCOUNTER — Encounter: Payer: Self-pay | Admitting: "Endocrinology

## 2022-07-04 ENCOUNTER — Other Ambulatory Visit: Payer: Self-pay | Admitting: Internal Medicine

## 2022-07-04 DIAGNOSIS — E785 Hyperlipidemia, unspecified: Secondary | ICD-10-CM

## 2022-07-09 ENCOUNTER — Ambulatory Visit: Payer: Medicare HMO | Admitting: Cardiology

## 2022-07-09 NOTE — Progress Notes (Deleted)
Clinical Summary Rachel George is a 41 y.o.female seen today as a new consult, referred by Dr Posey Pronto for the following medical problems.  Chest pain -     2. Leg edema - ?echo - history of LE edema Past Medical History:  Diagnosis Date   Anemia    Cataract    Cellulitis of toe of right foot 10/07/2016   Depression    Diabetes mellitus without complication [facility])    dx age 89   Diabetic foot ulcer ([place]) 10/15/2016   Fatty liver disease, nonalcoholic    Glaucoma    Hyperlipidemia    Hypertension    Macular degeneration    Morbid obesity with BMI of 50.0-59.9, adult ([place]) 05/01/2017   Neuropathy    Obesity, Class III, BMI 40-49.9 (morbid obesity) ([place]) 05/01/2017   Osteolysis, right ankle and foot 05/01/2017   Osteomyelitis ([place]) 12/13/2016   Partial blindness    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 14 tablet 0   atorvastatin (LIPITOR) 20 MG tablet TAKE 1 TABLET BY MOUTH ONCE DAILY AT  6  PM 90 tablet 0   Blood Glucose Monitoring Suppl (ACCU-CHEK GUIDE ME) w/Device KIT USE TESTING SUPPLIES TO TEST 4 TIMES DAILY. 1 kit 0   Chlorphen-PE-Acetaminophen (NOREL AD) 4-10-325 MG TABS Take 1 tablet by mouth 3 (three) times daily as needed. 30 tablet 0   diclofenac Sodium (VOLTAREN) 1 % GEL Apply 2 g topically 3 (three) times daily as needed. 50 g 0   dicyclomine (BENTYL) 10 MG capsule Take 1 capsule (10 mg total) by mouth 3 (three) times daily as needed for spasms. 30 capsule 0   dorzolamide-timolol (COSOPT) 22.3-6.8 MG/ML ophthalmic solution       ferrous sulfate 324 MG TBEC Take 65 mg by mouth.     fluticasone (FLONASE) 50 MCG/ACT nasal spray Place 2 sprays into both nostrils daily. 16 g 6   gabapentin (NEURONTIN) 300 MG capsule Take 1 capsule (300 mg total) by mouth 2 (two) times daily. For pain 60 capsule 5   gentamicin cream (GARAMYCIN) 0.1 % Apply 1 application topically 2 (two) times daily. 30 g 1   insulin aspart (NOVOLOG) 100 UNIT/ML FlexPen Inject 14-20 Units into the skin 3 (three) times daily with meals. 15 mL 11   insulin detemir (LEVEMIR FLEXPEN) 100 UNIT/ML FlexPen INJECT 80 UNITS UNDER THE SKIN AT BEDTIME 75 mL 0   lisinopril-hydrochlorothiazide (ZESTORETIC) 20-12.5 MG tablet Take 1 tablet by mouth daily. 90 tablet 0   loperamide (IMODIUM) 2 MG capsule TAKE 1 CAPSULE BY MOUTH EVERY 6 HOURS 30 capsule 0   mupirocin ointment (BACTROBAN) 2 % Apply 1 application topically 2 (two) times daily. 22 g 0   olopatadine (PATANOL) 0.1 % ophthalmic solution Place 1 drop into both eyes 2 times daily.     omeprazole (PRILOSEC) 40 MG capsule Take 1 capsule (40 mg total) by mouth daily. 30 capsule 5   ondansetron (ZOFRAN) 4 MG tablet Take 1 tablet (4 mg total) by mouth every 8 (eight) hours as needed  for nausea or vomiting. 20 tablet 0   Probiotic Product (BACID) CAPS Take 1 capsule by mouth in the morning and at bedtime. 30 capsule 2   sodium chloride (OCEAN) 0.65 % SOLN nasal spray Place 1 spray into both nostrils as needed for congestion. 30 mL 0   spironolactone (ALDACTONE) 50 MG tablet TAKE 1 TABLET TWICE DAILY 180 tablet 0   Vitamin D, Ergocalciferol, (DRISDOL) 1.25 MG (50000 UNIT) CAPS capsule TAKE 1 CAPSULE EVERY WEEK 12 capsule 0   No current facility-administered medications for this visit.     Past Surgical History:  Procedure Laterality Date   AMPUTATION TOE Right 12/16/2016   Procedure: RIGHT FIRST TOE AMPUTATION;  Surgeon: Vickie Epley, MD;  Location: [facility];  Service: General;  Laterality: Right;  Right first toe  amputation for osteomyelitis   eye lid transplant     RETINAL LASER PROCEDURE       Allergies  Allergen Reactions   Other Anaphylaxis   Shellfish Allergy Anaphylaxis, Shortness Of Breath and Swelling    Facial Swelling   Oxycodone Nausea And Vomiting   Percocet [Oxycodone-Acetaminophen] Itching and Nausea And Vomiting      Family History  Problem Relation Age of Onset   Diabetes Mother    Thyroid disease Mother    Diabetes Father      Social History Rachel George reports that she quit smoking about 5 years ago. Her smoking use included cigarettes. She smoked an average of .25 packs per day. She has never used smokeless tobacco. Rachel George reports no history of alcohol use.   Review of Systems CONSTITUTIONAL: No weight loss, fever, chills, weakness or fatigue.  HEENT: Eyes: No visual loss, blurred vision, double vision or yellow sclerae.No hearing loss, sneezing, congestion, runny nose or sore throat.  SKIN: No rash or itching.  CARDIOVASCULAR:  RESPIRATORY: No shortness of breath, cough or sputum.  GASTROINTESTINAL: No anorexia, nausea, vomiting or diarrhea. No abdominal pain or blood.  GENITOURINARY: No burning on urination, no polyuria NEUROLOGICAL: No headache, dizziness, syncope, paralysis, ataxia, numbness or tingling in the extremities. No change in bowel or bladder control.  MUSCULOSKELETAL: No muscle, back pain, joint pain or stiffness.  LYMPHATICS: No enlarged nodes. No history of splenectomy.  PSYCHIATRIC: No history of depression or anxiety.  ENDOCRINOLOGIC: No reports of sweating, cold or heat intolerance. No polyuria or polydipsia.  Marland Kitchen   Physical Examination There were no vitals filed for this visit. There were no vitals filed for this visit.  Gen: resting comfortably, no acute distress HEENT: no scleral icterus, pupils equal round and reactive, no palptable cervical adenopathy,  CV Resp: Clear to auscultation bilaterally GI: abdomen is soft,  non-tender, non-distended, normal bowel sounds, no hepatosplenomegaly MSK: extremities are warm, no edema.  Skin: warm, no rash Neuro:  no focal deficits Psych: appropriate affect   Diagnostic Studies     Assessment and Plan        Arnoldo Lenis, M.D., F.A.C.C.

## 2022-07-10 ENCOUNTER — Encounter: Payer: Medicare HMO | Admitting: Internal Medicine

## 2022-07-15 ENCOUNTER — Ambulatory Visit (INDEPENDENT_AMBULATORY_CARE_PROVIDER_SITE_OTHER): Payer: Medicare HMO | Admitting: Podiatry

## 2022-07-15 DIAGNOSIS — I739 Peripheral vascular disease, unspecified: Secondary | ICD-10-CM | POA: Diagnosis not present

## 2022-07-15 DIAGNOSIS — E0843 Diabetes mellitus due to underlying condition with diabetic autonomic (poly)neuropathy: Secondary | ICD-10-CM

## 2022-07-15 DIAGNOSIS — L97512 Non-pressure chronic ulcer of other part of right foot with fat layer exposed: Secondary | ICD-10-CM | POA: Diagnosis not present

## 2022-07-15 MED ORDER — GENTAMICIN SULFATE 0.1 % EX CREA: 1.0000 | TOPICAL_CREAM | Freq: Two times a day (BID) | CUTANEOUS | 1 refills | Status: AC

## 2022-07-15 NOTE — Progress Notes (Signed)
Chief Complaint  Patient presents with   foot care    DFC, callus trim, toe nail fungus    Subjective:  41 y.o. female with PMHx of diabetes mellitus presenting to the office today for follow-up evaluation of a wound to the lateral aspect of the right fifth toe.  Patient was last seen in the office October 2022 and at that time it had healed.  Most recently the patient states that she injured her right fifth toe when she stubbed it.  She has noticed a scab formation around the area.  She is also requesting a nail trim and nail evaluation today.  She presents for further treatment and evaluation   Past Medical History:  Diagnosis Date   Anemia    Cataract    Cellulitis of toe of right foot 10/07/2016   Depression    Diabetes mellitus without complication [facility])    dx age 66   Diabetic foot ulcer ([facility]) 10/15/2016   Fatty liver disease, nonalcoholic    Glaucoma    Hyperlipidemia    Hypertension    Macular degeneration    Morbid obesity with BMI of 50.0-59.9, adult ([facility]) 05/01/2017   Neuropathy    Obesity, Class III, BMI 40-49.9 (morbid obesity) ([facility]) 05/01/2017   Osteolysis, right ankle and foot 05/01/2017   Osteomyelitis ([facility]) 12/13/2016   Partial blindness    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   Suicidal ideation 11/04/2017    Past Surgical History:  Procedure Laterality Date   AMPUTATION TOE Right 12/16/2016   Procedure: RIGHT FIRST TOE AMPUTATION;  Surgeon: Ancil Linsey, MD;  Location: [facility];  Service: General;  Laterality: Right;  Right first toe amputation for osteomyelitis   eye lid transplant     RETINAL LASER PROCEDURE      Allergies  Allergen Reactions   Other Anaphylaxis   Shellfish Allergy Anaphylaxis, Shortness Of Breath and Swelling    Facial Swelling   Oxycodone Nausea And Vomiting   Percocet [Oxycodone-Acetaminophen] Itching and Nausea And Vomiting      Objective/Physical Exam General: The patient is alert and oriented x3 in no acute  distress.  Dermatology:  Wound #1 noted to the lateral aspect of the right fifth toe which measures about 0.3 x 0.2 x 0.1 cm (LxWxD).   To the noted ulceration(s), there is no eschar.  Minimal slough fibrin and necrotic tissue.  Wound base is red.  Mostly granulation tissue.  Periwound is heavily callused.  There is no exposed bone muscle tendon ligament or joint.  No drainage.  Remaining digits also have hyperkeratotic dystrophic nails bilateral.  Vascular: US ARTERIAL SEG MULTIPLE LE 11/21/2016 IMPRESSION: Preserved ABIs at rest bilaterally with evidence of nonocclusive peripheral vascular disease.  Skin is warm to touch.  Neurological: Epicritic and protective threshold diminished bilaterally.   Musculoskeletal Exam: History of prior amputation RT great toe with good healing  Assessment: 1.  Ulcer right fifth toe secondary to diabetes mellitus 2. diabetes mellitus w/ peripheral neuropathy 3.  Pain due to onychomycosis of toenails both    Plan of Care:  1. Patient was evaluated. 2. medically necessary excisional debridement including subcutaneous tissue was performed using a tissue nipper and a chisel blade. Excisional debridement of all the necrotic nonviable tissue down to healthy bleeding viable tissue was performed with post-debridement measurements same as pre-. 3. the wound was cleansed and dry sterile dressing applied. 4.  Prescription for gentamicin cream.  Apply daily with a light dressing  5.  Mechanical debridement of nails 1-5 bilateral was performed using a nail nipper without incident or bleeding  6.  Patient has history of diabetes with nonhealing wounds and longstanding history of PVD.  Updated order placed for ABIs  7.  Patient is to return to clinic in 4 weeks   Felecia Shelling, DPM [facility]  Dr. Felecia Shelling, DPM    [address] [place], [place] [address]                Office [phone]  Fax  [phone]

## 2022-07-16 DIAGNOSIS — H4051X2 Glaucoma secondary to other eye disorders, right eye, moderate stage: Secondary | ICD-10-CM | POA: Diagnosis not present

## 2022-07-16 DIAGNOSIS — E113523 Type 2 diabetes mellitus with proliferative diabetic retinopathy with traction retinal detachment involving the macula, bilateral: Secondary | ICD-10-CM | POA: Diagnosis not present

## 2022-07-16 DIAGNOSIS — Z794 Long term (current) use of insulin: Secondary | ICD-10-CM | POA: Diagnosis not present

## 2022-07-21 ENCOUNTER — Other Ambulatory Visit: Payer: Self-pay

## 2022-07-21 ENCOUNTER — Emergency Department (HOSPITAL_COMMUNITY)
Admission: EM | Admit: 2022-07-21 | Discharge: 2022-07-21 | Disposition: A | Discharge status: Home / Self Care | Payer: Medicare HMO | Attending: Emergency Medicine | Admitting: Emergency Medicine

## 2022-07-21 ENCOUNTER — Encounter (HOSPITAL_COMMUNITY): Payer: Self-pay | Admitting: *Deleted

## 2022-07-21 DIAGNOSIS — X100XXA Contact with hot drinks, initial encounter: Secondary | ICD-10-CM | POA: Insufficient documentation

## 2022-07-21 DIAGNOSIS — R1031 Right lower quadrant pain: Secondary | ICD-10-CM | POA: Diagnosis not present

## 2022-07-21 DIAGNOSIS — Z794 Long term (current) use of insulin: Secondary | ICD-10-CM | POA: Diagnosis not present

## 2022-07-21 DIAGNOSIS — T2122XA Burn of second degree of abdominal wall, initial encounter: Secondary | ICD-10-CM | POA: Diagnosis not present

## 2022-07-21 DIAGNOSIS — I10 Essential (primary) hypertension: Secondary | ICD-10-CM | POA: Diagnosis not present

## 2022-07-21 DIAGNOSIS — T310 Burns involving less than 10% of body surface: Secondary | ICD-10-CM | POA: Diagnosis not present

## 2022-07-21 MED ORDER — BACITRACIN ZINC 500 UNIT/GM EX OINT: 1.0000 | TOPICAL_OINTMENT | Freq: Two times a day (BID) | CUTANEOUS | 0 refills | Status: DC

## 2022-07-21 NOTE — ED Notes (Signed)
Wounds to abdomen cleanse with normal saline, xeroform and  gauze applied. Patient tolerated procedure.
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
## 2022-07-21 NOTE — Discharge Instructions (Addendum)
Please use bacitracin ointment or Neosporin over the abdomen and keep it covered.  I would like for you to follow-up with your primary care doctor for further evaluation.  Return to the emergency department for any worsening symptoms you might have.
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
## 2022-07-23 ENCOUNTER — Telehealth: Payer: Self-pay | Admitting: Internal Medicine
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
## 2022-07-24 ENCOUNTER — Telehealth: Payer: Self-pay

## 2022-07-24 ENCOUNTER — Other Ambulatory Visit: Payer: Self-pay | Admitting: Internal Medicine

## 2022-07-24 DIAGNOSIS — T300 Burn of unspecified body region, unspecified degree: Secondary | ICD-10-CM

## 2022-07-24 MED ORDER — SILVER SULFADIAZINE 1 % EX CREA: 1.0000 | TOPICAL_CREAM | Freq: Every day | CUTANEOUS | 0 refills | Status: DC

## 2022-07-24 NOTE — Telephone Encounter (Signed)
Dressing materials put at front desk patient aware

## 2022-07-24 NOTE — Telephone Encounter (Signed)
Patient called see if she could get wound care supplies from our office.  She has appt not until Friday. Call back # [phone].

## 2022-07-25 ENCOUNTER — Other Ambulatory Visit: Payer: Self-pay | Admitting: Internal Medicine

## 2022-07-25 DIAGNOSIS — E559 Vitamin D deficiency, unspecified: Secondary | ICD-10-CM

## 2022-07-26 ENCOUNTER — Encounter: Payer: Self-pay | Admitting: Internal Medicine

## 2022-07-26 ENCOUNTER — Ambulatory Visit (INDEPENDENT_AMBULATORY_CARE_PROVIDER_SITE_OTHER): Payer: Medicare HMO | Admitting: Internal Medicine

## 2022-07-26 VITALS — BP 152/84 | HR 98 | Resp 18 | Ht 67.0 in | Wt >= 6400 oz

## 2022-07-26 DIAGNOSIS — I5032 Chronic diastolic (congestive) heart failure: Secondary | ICD-10-CM | POA: Diagnosis not present

## 2022-07-26 DIAGNOSIS — Z794 Long term (current) use of insulin: Secondary | ICD-10-CM | POA: Diagnosis not present

## 2022-07-26 DIAGNOSIS — T300 Burn of unspecified body region, unspecified degree: Secondary | ICD-10-CM

## 2022-07-26 DIAGNOSIS — E1159 Type 2 diabetes mellitus with other circulatory complications: Secondary | ICD-10-CM

## 2022-07-26 DIAGNOSIS — I152 Hypertension secondary to endocrine disorders: Secondary | ICD-10-CM | POA: Diagnosis not present

## 2022-07-26 DIAGNOSIS — R5381 Other malaise: Secondary | ICD-10-CM | POA: Diagnosis not present

## 2022-07-26 DIAGNOSIS — E1165 Type 2 diabetes mellitus with hyperglycemia: Secondary | ICD-10-CM

## 2022-07-26 MED ORDER — DEXCOM G7 SENSOR MISC: 5 refills | Status: DC

## 2022-07-26 MED ORDER — CHLORTHALIDONE 25 MG PO TABS: 25.0000 mg | ORAL_TABLET | Freq: Every day | ORAL | 0 refills | Status: DC

## 2022-07-26 MED ORDER — OZEMPIC (0.25 OR 0.5 MG/DOSE) 2 MG/3ML ~~LOC~~ SOPN: 0.5000 mg | PEN_INJECTOR | SUBCUTANEOUS | 0 refills | Status: DC

## 2022-07-26 MED ORDER — DEXCOM G7 RECEIVER DEVI: 0 refills | Status: DC

## 2022-07-26 NOTE — Assessment & Plan Note (Signed)
Her leg swelling and orthopnea likely due to CHF Referred to cardiology for echo -also has episodes of chest pain, due to her history of type II DM and HTN with morbid obesity, at risk for CAD  On lisinopril-HCTZ and Spironolactone Added Chlorthalidone for uncontrolled HTN

## 2022-07-26 NOTE — Patient Instructions (Signed)
Please take Ozempic as prescribed. Please take Levemir 70 U once you start Ozempic.  Please take Chlorthalidone as prescribed.  Please continue taking other medications as prescribed.  Please follow low carb diet and ambulate as tolerated.

## 2022-07-26 NOTE — Progress Notes (Signed)
Established Patient Office Visit  Subjective:  Patient ID: Rachel George, female    DOB: 1981-08-04  Age: 41 y.o. MRN: [id number]  CC:  Chief Complaint  Patient presents with   Hypertension   Diabetes   Burn    pt got burned by boiling water on stomach 07-21-22 went to er was told to follow up with PCP    HPI Rachel George is a 41 y.o. female with past medical history of HTN, uncontrolled DM with polyneuropathy and retinopathy, HLD, GERD and morbid obesity who presents for f/u of her chronic medical conditions.  She had a burn injury from boiling water while cooking for her right-sided abdominal wall area.  She went to ER, where she had antibiotic dressing done.  She was later given silver sulfadiazine cream for dressing.  She states that the burn site is healing, and denies any foul-smelling discharge from the site.  HTN: Her blood pressure was slightly elevated in the office today.  She has been taking lisinopril HCTZ and spironolactone.  Compliance is questionable.  Of note, she likely has OSA/OHS, for which she was referred to sleep specialist, but has not set up appointment yet.  She denies any chest pain, or palpitations.   DM: Her HbA1c was 9.4 in 06/23.  She has been taking Levemir 80 units and NovoLog as per sliding scale.  She has not set up appointment with Dr. Dorris Fetch yet since last visit.  She states that she has been following low-carb diet.  Denies any polyuria or polyphagia currently.  She has not brought her blood glucose log or meter, but states that her blood glucose has been around 130 most of the time.  Denies any episode of hypoglycemia.  Past Medical History:  Diagnosis Date   Anemia    Cataract    Cellulitis of toe of right foot 10/07/2016   Depression    Diabetes mellitus without complication [facility])    dx age 23   Diabetic foot ulcer ([place]) 10/15/2016   Fatty liver disease, nonalcoholic    Glaucoma    Hyperlipidemia    Hypertension    Macular  degeneration    Morbid obesity with BMI of 50.0-59.9, adult ([place]) 05/01/2017   Neuropathy    Obesity, Class III, BMI 40-49.9 (morbid obesity) ([place]) 05/01/2017   Osteolysis, right ankle and foot 05/01/2017   Osteomyelitis ([place]) 12/13/2016   Partial blindness    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   Suicidal ideation 11/04/2017    Past Surgical History:  Procedure Laterality Date   AMPUTATION TOE Right 12/16/2016   Procedure: RIGHT FIRST TOE AMPUTATION;  Surgeon: Vickie Epley, MD;  Location: [facility];  Service: General;  Laterality: Right;  Right first toe amputation for osteomyelitis   eye lid transplant     RETINAL LASER PROCEDURE      Family History  Problem Relation Age of Onset   Diabetes Mother    Thyroid disease Mother    Diabetes Father     Social History   Socioeconomic History   Marital status: Legally Separated    Spouse name: Hanne Kegg   Number of children: 0   Years of education: Not on file   Highest education level: High school graduate  Occupational History   Occupation: CNA personal care giver  Tobacco Use   Smoking status: Former    Packs/day: 0.25    Types: Cigarettes    Quit date: 12/11/2016    Years since  quitting: 5.6   Smokeless tobacco: Never  Vaping Use   Vaping Use: Never used  Substance and Sexual Activity   Alcohol use: No   Drug use: No   Sexual activity: Yes    Partners: Male    Birth control/protection: None  Other Topics Concern   Not on file  Social History Narrative   Separated from husband-married for 7 years      Lives alone right now, in a disability apartment.   Right handed      Enjoys walking       Diet: eat one meal a day- veggies, meats, low carb    Caffeine: tea-4 cups daily    Water: 4-6 bottles of water      Wears seat belt   Smoke detectors    Does not use phone while driving   Social Determinants of Health   Financial Resource Strain: Medium Risk (11/19/2021)   Overall Financial Resource Strain  (CARDIA)    Difficulty of Paying Living Expenses: Somewhat hard  Food Insecurity: Food Insecurity Present (11/19/2021)   Hunger Vital Sign    Worried About Running Out of Food in the Last Year: Often true    Ran Out of Food in the Last Year: Often true  Transportation Needs: No Transportation Needs (11/19/2021)   PRAPARE - Hydrologist (Medical): No    Lack of Transportation (Non-Medical): No  Physical Activity: Sufficiently Active (11/19/2021)   Exercise Vital Sign    Days of Exercise per Week: 4 days    Minutes of Exercise per Session: 60 min  Stress: No Stress Concern Present (11/10/2020)   [place]    Feeling of Stress : Only a little  Social Connections: Socially Isolated (11/19/2021)   Social Connection and Isolation Panel [NHANES]    Frequency of Communication with Friends and Family: Three times a week    Frequency of Social Gatherings with Friends and Family: Twice a week    Attends Religious Services: Never    Marine scientist or Organizations: No    Attends Archivist Meetings: Never    Marital Status: Never married  Intimate Partner Violence: Not At Risk (11/10/2020)   Humiliation, Afraid, Rape, and Kick questionnaire    Fear of Current or Ex-Partner: No    Emotionally Abused: No    Physically Abused: No    Sexually Abused: No    Outpatient Medications Prior to Visit  Medication Sig Dispense Refill   atorvastatin (LIPITOR) 20 MG tablet TAKE 1 TABLET BY MOUTH ONCE DAILY AT  6  PM 90 tablet 0   bacitracin ointment Apply 1 Application topically 2 (two) times daily. 120 g 0   Blood Glucose Monitoring Suppl (ACCU-CHEK GUIDE ME) w/Device KIT USE TESTING SUPPLIES TO TEST 4 TIMES DAILY. 1 kit 0   diclofenac Sodium (VOLTAREN) 1 % GEL Apply 2 g topically 3 (three) times daily as needed. 50 g 0   dorzolamide-timolol (COSOPT) 22.3-6.8 MG/ML ophthalmic solution       ferrous sulfate 324 MG TBEC Take 65 mg by mouth.     fluticasone (FLONASE) 50 MCG/ACT nasal spray Place 2 sprays into both nostrils daily. 16 g 6   gabapentin (NEURONTIN) 300 MG capsule Take 1 capsule (300 mg total) by mouth 2 (two) times daily. For pain 60 capsule 5   gentamicin cream (GARAMYCIN) 0.1 % Apply 1 Application topically 2 (two) times daily. 30 g 1  insulin aspart (NOVOLOG) 100 UNIT/ML FlexPen Inject 14-20 Units into the skin 3 (three) times daily with meals. 15 mL 11   insulin detemir (LEVEMIR FLEXPEN) 100 UNIT/ML FlexPen INJECT 80 UNITS UNDER THE SKIN AT BEDTIME 75 mL 0   lisinopril-hydrochlorothiazide (ZESTORETIC) 20-12.5 MG tablet Take 1 tablet by mouth daily. 90 tablet 0   loperamide (IMODIUM) 2 MG capsule TAKE 1 CAPSULE BY MOUTH EVERY 6 HOURS 30 capsule 0   mupirocin ointment (BACTROBAN) 2 % Apply 1 application topically 2 (two) times daily. 22 g 0   olopatadine (PATANOL) 0.1 % ophthalmic solution Place 1 drop into both eyes 2 times daily.     omeprazole (PRILOSEC) 40 MG capsule Take 1 capsule (40 mg total) by mouth daily. 30 capsule 5   silver sulfADIAZINE (SILVADENE) 1 % cream Apply 1 Application topically daily. 50 g 0   spironolactone (ALDACTONE) 50 MG tablet TAKE 1 TABLET TWICE DAILY 180 tablet 0   Vitamin D, Ergocalciferol, (DRISDOL) 1.25 MG (50000 UNIT) CAPS capsule TAKE 1 CAPSULE EVERY WEEK 12 capsule 0   Probiotic Product (BACID) CAPS Take 1 capsule by mouth in the morning and at bedtime. 30 capsule 2   amoxicillin-clavulanate (AUGMENTIN) 875-125 MG tablet Take 1 tablet by mouth 2 (two) times daily. (Patient not taking: Reported on 07/26/2022) 14 tablet 0   Chlorphen-PE-Acetaminophen (NOREL AD) 4-10-325 MG TABS Take 1 tablet by mouth 3 (three) times daily as needed. (Patient not taking: Reported on 07/26/2022) 30 tablet 0   dicyclomine (BENTYL) 10 MG capsule Take 1 capsule (10 mg total) by mouth 3 (three) times daily as needed for spasms. (Patient not taking: Reported on  07/26/2022) 30 capsule 0   ondansetron (ZOFRAN) 4 MG tablet Take 1 tablet (4 mg total) by mouth every 8 (eight) hours as needed for nausea or vomiting. (Patient not taking: Reported on 07/26/2022) 20 tablet 0   sodium chloride (OCEAN) 0.65 % SOLN nasal spray Place 1 spray into both nostrils as needed for congestion. (Patient not taking: Reported on 07/26/2022) 30 mL 0   No facility-administered medications prior to visit.    Allergies  Allergen Reactions   Other Anaphylaxis   Shellfish Allergy Anaphylaxis, Shortness Of Breath and Swelling    Facial Swelling   Oxycodone Nausea And Vomiting   Percocet [Oxycodone-Acetaminophen] Itching and Nausea And Vomiting    ROS Review of Systems  Constitutional:  Positive for fatigue. Negative for chills and fever.  HENT:  Negative for congestion, sinus pressure, sinus pain and sore throat.   Eyes:  Positive for visual disturbance. Negative for pain and discharge.  Respiratory:  Positive for shortness of breath. Negative for cough.   Cardiovascular:  Negative for chest pain and palpitations.  Gastrointestinal:  Negative for nausea and vomiting.  Endocrine: Negative for polydipsia and polyuria.  Genitourinary:  Negative for dysuria and hematuria.  Musculoskeletal:  Negative for neck pain and neck stiffness.  Skin:  Positive for rash and wound.  Neurological:  Negative for dizziness and weakness.  Psychiatric/Behavioral:  Positive for sleep disturbance. Negative for agitation and behavioral problems.       Objective:    Physical Exam Vitals reviewed.  Constitutional:      General: She is not in acute distress.    Appearance: She is obese. She is not diaphoretic.  HENT:     Head: Normocephalic and atraumatic.     Nose: Nose normal. No congestion.     Mouth/Throat:     Mouth: Mucous membranes are moist.  Pharynx: No posterior oropharyngeal erythema.  Eyes:     General: No scleral icterus. Cardiovascular:     Rate and Rhythm: Normal rate  and regular rhythm.     Heart sounds: Normal heart sounds. No murmur heard.    Comments: DPA 1+ b/l Pulmonary:     Breath sounds: No wheezing or rales.     Comments: Decreased breath sounds in lower lung fields Abdominal:     Palpations: Abdomen is soft.     Tenderness: There is no abdominal tenderness.  Musculoskeletal:     Cervical back: Neck supple. No tenderness.     Comments: S/p amputation of great toe - right LE  Skin:    General: Skin is warm.     Comments: Burn injury over RLQ area - erythema and sloughing of skin, about 1% of body surface area  Neurological:     General: No focal deficit present.     Mental Status: She is alert and oriented to person, place, and time.     Sensory: Sensory deficit (B/l feet) present.     Motor: Weakness (B/l feet - 4/5) present.  Psychiatric:        Mood and Affect: Mood normal.        Behavior: Behavior normal.     BP (!) 152/84 (BP Location: Left Arm, Cuff Size: Normal)   Pulse 98   Resp 18   Ht _0  (1.702 m)   Wt (!) 442 lb (200.5 kg)   LMP 07/11/2022   SpO2 100%   BMI 69.23 kg/m  Wt Readings from Last 3 Encounters:  07/26/22 (!) 442 lb (200.5 kg)  05/09/22 (!) 439 lb 12.8 oz (199.5 kg)  08/14/21 (!) 427 lb 0.6 oz (193.7 kg)    Lab Results  Component Value Date   TSH 1.420 05/20/2022   Lab Results  Component Value Date   WBC 8.7 05/20/2022   HGB 11.0 (L) 05/20/2022   HCT 34.9 05/20/2022   MCV 81 05/20/2022   PLT 222 05/20/2022   Lab Results  Component Value Date   NA 139 05/20/2022   K 5.0 05/20/2022   CO2 20 05/20/2022   GLUCOSE 151 (H) 05/20/2022   BUN 26 (H) 05/20/2022   CREATININE 1.45 (H) 05/20/2022   BILITOT 0.2 05/20/2022   ALKPHOS 92 05/20/2022   AST 14 05/20/2022   ALT 10 05/20/2022   PROT 7.0 05/20/2022   ALBUMIN 3.7 (L) 05/20/2022   CALCIUM 9.0 05/20/2022   ANIONGAP 12 07/22/2019   EGFR 46 (L) 05/20/2022   Lab Results  Component Value Date   CHOL 116 05/20/2022   Lab Results   Component Value Date   HDL 43 05/20/2022   Lab Results  Component Value Date   LDLCALC 50 05/20/2022   Lab Results  Component Value Date   TRIG 128 05/20/2022   Lab Results  Component Value Date   CHOLHDL 2.7 05/20/2022   Lab Results  Component Value Date   HGBA1C 9.4 (H) 05/20/2022      Assessment & Plan:   Problem List Items Addressed This Visit       Cardiovascular and Mediastinum   Hypertension associated with diabetes ([place])    BP Readings from Last 1 Encounters:  07/26/22 (!) 152/84  Uncontrolled with Lisinopril-HCTZ and Spironolactone Added Chlorthalidone for uncontrolled HTN Could be a component of pain and OSA/OHS, had referred for sleep study, but has not set up appointment yet Counseled for compliance with the medications Advised DASH  diet and moderate exercise/walking, at least 150 mins/week      Relevant Medications   Semaglutide,0.25 or 0.5MG/DOS, (OZEMPIC, 0.25 OR 0.5 MG/DOSE,) 2 MG/3ML SOPN   chlorthalidone (HYGROTON) 25 MG tablet   Other Relevant Orders   Ambulatory referral to [place]   Chronic heart failure with preserved ejection fraction (HFpEF) (HCC)    Her leg swelling and orthopnea likely due to CHF Referred to cardiology for echo -also has episodes of chest pain, due to her history of type II DM and HTN with morbid obesity, at risk for CAD  On lisinopril-HCTZ and Spironolactone Added Chlorthalidone for uncontrolled HTN      Relevant Medications   chlorthalidone (HYGROTON) 25 MG tablet     Endocrine   Uncontrolled type 2 diabetes mellitus with hyperglycemia, with long-term current use of insulin (HCC) - Primary    Lab Results  Component Value Date   HGBA1C 9.4 (H) 05/20/2022  On Levemir 80 U QD and Novolog sliding scale Has not brought blood glucose log, states it stays around 130, with only few readings above 200 Added Ozempic, increase dose as tolerated Decreased Levemir to 70 units nightly Prescribed Dexcom G7, samples  provided for continuous glucose monitoring Saw Dr Dorris Fetch in the past, had referral sent again  Advised to follow diabetic diet On ACE and statin F/u CMP and lipid panel Diabetic eye exam: Advised to follow up with Ophthalmology for diabetic eye exam      Relevant Medications   Semaglutide,0.25 or 0.5MG/DOS, (OZEMPIC, 0.25 OR 0.5 MG/DOSE,) 2 MG/3ML SOPN   Continuous Blood Gluc Sensor (DEXCOM G7 SENSOR) MISC   Continuous Blood Gluc Receiver ([place]) DEVI   Other Relevant Orders   Ambulatory referral to [place]     Other   Morbid obesity ([place])    Contributing to uncontrolled DM and HTN Likely has OSA/OHS Added Ozempic for type 2 DM Emphasized to comply with diet recs      Relevant Medications   Semaglutide,0.25 or 0.5MG/DOS, (OZEMPIC, 0.25 OR 0.5 MG/DOSE,) 2 MG/3ML SOPN   Burn    Abdominal wall area burn - second degree burn due to boiling water Continue dressing with antibiotic ointment Silver sulfadiazine cream prescribed      Physical deconditioning    Likely due to morbid obesity, type II DM with neuropathy and retinopathy limiting her vision, OSA/OHS and CHF Referred to home health - can help with medication compliance and wound care       Meds ordered this encounter  Medications   Semaglutide,0.25 or 0.5MG/DOS, (OZEMPIC, 0.25 OR 0.5 MG/DOSE,) 2 MG/3ML SOPN    Sig: Inject 0.5 mg into the skin every 7 (seven) days.    Dispense:  3 mL    Refill:  0   Continuous Blood Gluc Sensor (DEXCOM G7 SENSOR) MISC    Sig: Please use it to check blood glucose as directed.    Dispense:  3 each    Refill:  5   Continuous Blood Gluc Receiver (DEXCOM G7 RECEIVER) DEVI    Sig: Please use it to check blood glucose as directed.    Dispense:  1 each    Refill:  0   chlorthalidone (HYGROTON) 25 MG tablet    Sig: Take 1 tablet (25 mg total) by mouth daily.    Dispense:  90 tablet    Refill:  0    Follow-up: Return in about 4 weeks (around 08/23/2022) for Annual  physical.    Lindell Spar, MD

## 2022-07-26 NOTE — Assessment & Plan Note (Signed)
Contributing to uncontrolled DM and HTN Likely has OSA/OHS Added Ozempic for type 2 DM Emphasized to comply with diet recs 

## 2022-07-26 NOTE — Assessment & Plan Note (Signed)
Abdominal wall area burn - second degree burn due to boiling water Continue dressing with antibiotic ointment Silver sulfadiazine cream prescribed

## 2022-07-26 NOTE — Assessment & Plan Note (Signed)
Likely due to morbid obesity, type II DM with neuropathy and retinopathy limiting her vision, OSA/OHS and CHF Referred to home health - can help with medication compliance and wound care

## 2022-07-26 NOTE — Assessment & Plan Note (Addendum)
Lab Results  Component Value Date   HGBA1C 9.4 (H) 05/20/2022   On Levemir 80 U QD and Novolog sliding scale Has not brought blood glucose log, states it stays around 130, with only few readings above 200 Added Ozempic, increase dose as tolerated Decreased Levemir to 70 units nightly Prescribed Dexcom G7, samples provided for continuous glucose monitoring Saw Dr Fransico Him in the past, had referral sent again  Advised to follow diabetic diet On ACE and statin F/u CMP and lipid panel Diabetic eye exam: Advised to follow up with Ophthalmology for diabetic eye exam

## 2022-07-26 NOTE — Assessment & Plan Note (Signed)
BP Readings from Last 1 Encounters:  07/26/22 (!) 152/84   Uncontrolled with Lisinopril-HCTZ and Spironolactone Added Chlorthalidone for uncontrolled HTN Could be a component of pain and OSA/OHS, had referred for sleep study, but has not set up appointment yet Counseled for compliance with the medications Advised DASH diet and moderate exercise/walking, at least 150 mins/week

## 2022-07-31 ENCOUNTER — Other Ambulatory Visit: Payer: Self-pay | Admitting: Internal Medicine

## 2022-07-31 DIAGNOSIS — L680 Hirsutism: Secondary | ICD-10-CM

## 2022-07-31 DIAGNOSIS — E119 Type 2 diabetes mellitus without complications: Secondary | ICD-10-CM

## 2022-08-01 ENCOUNTER — Ambulatory Visit: Payer: Medicare HMO | Admitting: Internal Medicine

## 2022-08-01 ENCOUNTER — Telehealth: Payer: Self-pay | Admitting: "Endocrinology

## 2022-08-01 NOTE — Telephone Encounter (Signed)
New message   Incoming referral to see Dr. Fransico Him. Last office visit in  2021.  Unable to contact patient  6/8 7/31 8/2- mail No Show letter  8/31 LVM to contact the office   Referral closed

## 2022-08-02 ENCOUNTER — Encounter (HOSPITAL_COMMUNITY): Payer: Medicare HMO

## 2022-08-08 ENCOUNTER — Ambulatory Visit (HOSPITAL_COMMUNITY)
Admission: RE | Admit: 2022-08-08 | Discharge: 2022-08-08 | Disposition: A | Discharge status: Home / Self Care | Payer: Medicare HMO | Source: Ambulatory Visit | Attending: Podiatry | Admitting: Podiatry

## 2022-08-08 DIAGNOSIS — L97512 Non-pressure chronic ulcer of other part of right foot with fat layer exposed: Secondary | ICD-10-CM | POA: Insufficient documentation

## 2022-08-08 DIAGNOSIS — E0843 Diabetes mellitus due to underlying condition with diabetic autonomic (poly)neuropathy: Secondary | ICD-10-CM | POA: Diagnosis not present

## 2022-08-09 ENCOUNTER — Other Ambulatory Visit: Payer: Self-pay | Admitting: Podiatry

## 2022-08-09 DIAGNOSIS — L97512 Non-pressure chronic ulcer of other part of right foot with fat layer exposed: Secondary | ICD-10-CM

## 2022-08-09 DIAGNOSIS — I739 Peripheral vascular disease, unspecified: Secondary | ICD-10-CM

## 2022-08-09 NOTE — Progress Notes (Signed)
Referral placed to [facility] vein and vascular to review abnormal ABIs.

## 2022-08-09 NOTE — Progress Notes (Signed)
Referral to vein and vascular due to abnormal ABIs

## 2022-08-13 DIAGNOSIS — K219 Gastro-esophageal reflux disease without esophagitis: Secondary | ICD-10-CM

## 2022-08-13 DIAGNOSIS — E1136 Type 2 diabetes mellitus with diabetic cataract: Secondary | ICD-10-CM

## 2022-08-13 DIAGNOSIS — I152 Hypertension secondary to endocrine disorders: Secondary | ICD-10-CM | POA: Diagnosis not present

## 2022-08-13 DIAGNOSIS — H21541 Posterior synechiae (iris), right eye: Secondary | ICD-10-CM | POA: Diagnosis not present

## 2022-08-13 DIAGNOSIS — E782 Mixed hyperlipidemia: Secondary | ICD-10-CM

## 2022-08-13 DIAGNOSIS — M25562 Pain in left knee: Secondary | ICD-10-CM | POA: Diagnosis not present

## 2022-08-13 DIAGNOSIS — F332 Major depressive disorder, recurrent severe without psychotic features: Secondary | ICD-10-CM

## 2022-08-13 DIAGNOSIS — E113523 Type 2 diabetes mellitus with proliferative diabetic retinopathy with traction retinal detachment involving the macula, bilateral: Secondary | ICD-10-CM | POA: Diagnosis not present

## 2022-08-13 DIAGNOSIS — H548 Legal blindness, as defined in USA: Secondary | ICD-10-CM | POA: Diagnosis not present

## 2022-08-13 DIAGNOSIS — E1159 Type 2 diabetes mellitus with other circulatory complications: Secondary | ICD-10-CM

## 2022-08-13 DIAGNOSIS — E1142 Type 2 diabetes mellitus with diabetic polyneuropathy: Secondary | ICD-10-CM

## 2022-08-13 DIAGNOSIS — M25561 Pain in right knee: Secondary | ICD-10-CM | POA: Diagnosis not present

## 2022-08-13 DIAGNOSIS — G4733 Obstructive sleep apnea (adult) (pediatric): Secondary | ICD-10-CM | POA: Diagnosis not present

## 2022-08-13 DIAGNOSIS — E559 Vitamin D deficiency, unspecified: Secondary | ICD-10-CM

## 2022-08-13 DIAGNOSIS — K760 Fatty (change of) liver, not elsewhere classified: Secondary | ICD-10-CM

## 2022-08-13 DIAGNOSIS — G8929 Other chronic pain: Secondary | ICD-10-CM | POA: Diagnosis not present

## 2022-08-13 DIAGNOSIS — D649 Anemia, unspecified: Secondary | ICD-10-CM

## 2022-08-13 DIAGNOSIS — I5032 Chronic diastolic (congestive) heart failure: Secondary | ICD-10-CM | POA: Diagnosis not present

## 2022-08-13 DIAGNOSIS — H2513 Age-related nuclear cataract, bilateral: Secondary | ICD-10-CM

## 2022-08-14 ENCOUNTER — Ambulatory Visit: Payer: Medicare HMO | Admitting: "Endocrinology

## 2022-08-15 ENCOUNTER — Encounter: Payer: Self-pay | Admitting: Cardiovascular Disease

## 2022-08-15 NOTE — Progress Notes (Unsigned)
Cardiology Office Note:    Date:  08/16/2022   ID:  Rachel, George 23-Jun-1981, MRN [id number]  PCP:  Lindell Spar, MD   [place] Providers Cardiologist: Rechelle Niebla    Referring MD: Lindell Spar, MD   Chief Complaint  Patient presents with   Congestive Heart Failure         History of Present Illness:    Rachel George is a 41 y.o. female with a hx of morbid obesity, CHF, HLD, HTN   We are asked to see her for further evaluation of her racing HR at night .  Has diabetic retinopathy Legally blind   Wakes up with racing HR and chest pain after she has been asleep for several hours Was told that she likely had OSA ( during R eye surgery )   had a sleep study in 2019 Never got the results  Never got a CPAP   Has leg edema  Severe DOE , cp .   Follows a low salt diet  Eats baked chicken, fish   Trying to lose weight .   Still eats lots of starch ( potatoes, pasta)  Cannot afford better quality food   Is unable to work ,  [place] working in 2018 due to poor eye sight  Has gained 100 lbs since that time Has stopped smoking   Going through some difficulty with the food stamp program  Gets pasta and mac and cheese for free ( Different programs will bring it to her appartment )  Able to get her meds at no cost   Had lower ext ABI        Past Medical History:  Diagnosis Date   Anemia    Cataract    Cellulitis of toe of right foot 10/07/2016   Depression    Diabetes mellitus without complication [facility])    dx age 33   Diabetic foot ulcer ([place]) 10/15/2016   Fatty liver disease, nonalcoholic    Glaucoma    Hyperlipidemia    Hypertension    Macular degeneration    Morbid obesity with BMI of 50.0-59.9, adult ([place]) 05/01/2017   Neuropathy    Obesity, Class III, BMI 40-49.9 (morbid obesity) ([place]) 05/01/2017   Osteolysis, right ankle and foot 05/01/2017   Osteomyelitis ([place]) 12/13/2016   Partial blindness    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   bacitracin-polymyxin b (POLYSPORIN) ophthalmic ointment PLACE OINTMENT INTO THE RIGHT EYE FOUR TIMES DAILY   Blood Glucose Monitoring Suppl (ACCU-CHEK GUIDE ME) w/Device KIT USE TESTING SUPPLIES TO TEST 4 TIMES DAILY.   chlorthalidone (HYGROTON) 25 MG tablet Take 1 tablet (25 mg total) by mouth daily.   diclofenac Sodium (VOLTAREN) 1 % GEL Apply 2 g topically 3 (three) times daily as needed.   dorzolamide-timolol (COSOPT) 22.3-6.8 MG/ML ophthalmic solution    ferrous  sulfate 324 MG TBEC Take 65 mg by mouth.   fluticasone (FLONASE) 50 MCG/ACT nasal spray Place 2 sprays into both nostrils daily.   gabapentin (NEURONTIN) 300 MG capsule Take 1 capsule (300 mg total) by mouth 2 (two) times daily. For pain   gentamicin cream (GARAMYCIN) 0.1 % Apply 1 Application topically 2 (two) times daily.   insulin detemir (LEVEMIR FLEXPEN) 100 UNIT/ML FlexPen INJECT 80 UNITS UNDER THE SKIN AT BEDTIME   lisinopril-hydrochlorothiazide (ZESTORETIC) 20-12.5 MG tablet Take 1 tablet by mouth daily.   loperamide (IMODIUM) 2 MG capsule TAKE 1 CAPSULE BY MOUTH EVERY 6 HOURS   LUMIGAN 0.01 % SOLN INSTILL 1 DROP INTO LEFT EYE ONCE DAILY   mupirocin ointment (BACTROBAN) 2 % Apply 1 application topically 2 (two) times daily.   NOVOLOG FLEXPEN 100 UNIT/ML FlexPen INJECT 14 TO 20 UNITS INTO THE SKIN 3 (THREE) TIMES DAILY WITH MEALS.   olopatadine  (PATANOL) 0.1 % ophthalmic solution Place 1 drop into both eyes 2 times daily.   omeprazole (PRILOSEC) 40 MG capsule Take 1 capsule (40 mg total) by mouth daily.   Semaglutide,0.25 or 0.5MG/DOS, (OZEMPIC, 0.25 OR 0.5 MG/DOSE,) 2 MG/3ML SOPN Inject 0.5 mg into the skin every 7 (seven) days.   silver sulfADIAZINE (SILVADENE) 1 % cream Apply 1 Application topically daily.   spironolactone (ALDACTONE) 50 MG tablet TAKE 1 TABLET TWICE DAILY   Vitamin D, Ergocalciferol, (DRISDOL) 1.25 MG (50000 UNIT) CAPS capsule TAKE 1 CAPSULE EVERY WEEK     Allergies:   Other, Shellfish allergy, Oxycodone, and Percocet [oxycodone-acetaminophen]   Social History   Socioeconomic History   Marital status: Legally Separated    Spouse name: Shara Hartis   Number of children: 0   Years of education: Not on file   Highest education level: High school graduate  Occupational History   Occupation: CNA personal care giver  Tobacco Use   Smoking status: Former    Packs/day: 0.25    Types: Cigarettes    Quit date: 12/11/2016    Years since quitting: 5.6   Smokeless tobacco: Never  Vaping Use   Vaping Use: Never used  Substance and Sexual Activity   Alcohol use: No   Drug use: No   Sexual activity: Yes    Partners: Male    Birth control/protection: None  Other Topics Concern   Not on file  Social History Narrative   Separated from husband-married for 7 years      Lives alone right now, in a disability apartment.   Right handed      Enjoys walking       Diet: eat one meal a day- veggies, meats, low carb    Caffeine: tea-4 cups daily    Water: 4-6 bottles of water      Wears seat belt   Smoke detectors    Does not use phone while driving   Social Determinants of Health   Financial Resource Strain: Medium Risk (11/19/2021)   Overall Financial Resource Strain (CARDIA)    Difficulty of Paying Living Expenses: Somewhat hard  Food Insecurity: Food Insecurity Present (08/16/2022)   Hunger Vital Sign     Worried About Running Out of Food in the Last Year: Sometimes true    Ran Out of Food in the Last Year: Sometimes true  Transportation Needs: No Transportation Needs (11/19/2021)   PRAPARE - Hydrologist (Medical): No    Lack of Transportation (Non-Medical): No  Physical Activity: Sufficiently Active (11/19/2021)  Exercise Vital Sign    Days of Exercise per Week: 4 days    Minutes of Exercise per Session: 60 min  Stress: No Stress Concern Present (11/10/2020)   [place]    Feeling of Stress : Only a little  Social Connections: Socially Isolated (11/19/2021)   Social Connection and Isolation Panel [NHANES]    Frequency of Communication with Friends and Family: Three times a week    Frequency of Social Gatherings with Friends and Family: Twice a week    Attends Religious Services: Never    Marine scientist or Organizations: No    Attends Music therapist: Never    Marital Status: Never married     Family History: The patient's family history includes Diabetes in her father and mother; Thyroid disease in her mother.  ROS:   Please see the history of present illness.     All other systems reviewed and are negative.  EKGs/Labs/Other Studies Reviewed:    The following studies were reviewed today:   EKG:   May 09, 2022: Normal sinus rhythm.  Poor R wave progression, likely due to lead placement  Recent Labs: 05/20/2022: ALT 10; BNP 6.8; BUN 26; Creatinine, Ser 1.45; Hemoglobin 11.0; Platelets 222; Potassium 5.0; Sodium 139; TSH 1.420  Recent Lipid Panel    Component Value Date/Time   CHOL 116 05/20/2022 0836   TRIG 128 05/20/2022 0836   HDL 43 05/20/2022 0836   CHOLHDL 2.7 05/20/2022 0836   CHOLHDL 3.9 01/26/2020 1447   VLDL 34 12/07/2018 0639   LDLCALC 50 05/20/2022 0836   LDLCALC 134 (H) 01/26/2020 1447     Risk Assessment/Calculations:            STOP-Bang Score:  6       Physical Exam:    VS:  BP 139/81   Pulse 85   Ht _0  (1.702 m)   Wt (!) 435 lb 6.4 oz (197.5 kg)   LMP 07/11/2022   SpO2 97%   BMI 68.19 kg/m     Wt Readings from Last 3 Encounters:  08/16/22 (!) 435 lb 6.4 oz (197.5 kg)  07/26/22 (!) 442 lb (200.5 kg)  05/09/22 (!) 439 lb 12.8 oz (199.5 kg)     GEN: morbidly obese female,  blind in R eye , in no acute distress HEENT: Normal NECK: No JVD; No carotid bruits LYMPHATICS: No lymphadenopathy CARDIAC: RRR, no murmurs, rubs, gallops RESPIRATORY:  Clear to auscultation without rales, wheezing or rhonchi  ABDOMEN: Soft, non-tender, non-distended MUSCULOSKELETAL:  No edema; No deformity  SKIN: Warm and dry NEUROLOGIC:  Alert and oriented x 3 PSYCHIATRIC:  Normal affect   ASSESSMENT:    1. OSA (obstructive sleep apnea)   2. Dyspnea on exertion    PLAN:    In order of problems listed above:  Obstructive sleep apnea: Al[place]na has been diagnosed with obstructive sleep apnea in 2019.  Since that time she is gained over 100 pounds.  She eats mostly Pasta and potatoes because those are given to her for free.  She really cannot afford healthy food.  Almost all of her symptoms in fact probably all of her symptoms are related to obstructive sleep apnea and her obesity.  I have strongly encouraged her to work on a better diet.  She is not really able to exercise.  We will arrange for her to get an Itamar sleep study. I think she would benefit from CPAP.  We will have her see Dr. Radford Pax for further evaluation and management of her obstructive sleep apnea.  2.  Morbid obesity: Have strongly encouraged her to work on improved diet and exercise program.  3.  Social issues.  I discussed the case with Dr. Harriet Masson.   She will reach out to some of her resources to see if they can help              Medication Adjustments/Labs and Tests Ordered: Current medicines are reviewed at length with the patient today.   Concerns regarding medicines are outlined above.  Orders Placed This Encounter  Procedures   ECHOCARDIOGRAM COMPLETE   Itamar Sleep Study   No orders of the defined types were placed in this encounter.   Patient Instructions  Medication Instructions:  Your physician recommends that you continue on your current medications as directed. Please refer to the Current Medication list given to you today.  *If you need a refill on your cardiac medications before your next appointment, please call your pharmacy*  Lab Work: If you have labs (blood work) drawn today and your tests are completely normal, you will receive your results only by: [place] (if you have MyChart) OR A paper copy in the mail If you have any lab test that is abnormal or we need to change your treatment, we will call you to review the results.  Testing/Procedures: Your physician has requested that you have an echocardiogram. Echocardiography is a painless test that uses sound waves to create images of your heart. It provides your doctor with information about the size and shape of your heart and how well your heart's chambers and valves are working. This procedure takes approximately one hour. There are no restrictions for this procedure.  Your physician has recommended that you have a sleep study. This test records several body functions during sleep, including: brain activity, eye movement, oxygen and carbon dioxide blood levels, heart rate and rhythm, breathing rate and rhythm, the flow of air through your mouth and nose, snoring, body muscle movements, and chest and belly movement.  Follow-Up: At [facility], you and your health needs are our priority.  As part of our continuing mission to provide you with exceptional heart care, we have created designated Provider Care Teams.  These Care Teams include your primary Cardiologist (physician) and Advanced Practice Providers (APPs -  Physician Assistants and  Nurse Practitioners) who all work together to provide you with the care you need, when you need it.  We recommend signing up for the patient portal called "MyChart".  Sign up information is provided on this After Visit Summary.  MyChart is used to connect with patients for Virtual Visits (Telemedicine).  Patients are able to view lab/test results, encounter notes, upcoming appointments, etc.  Non-urgent messages can be sent to your provider as well.   To learn more about what you can do with MyChart, go to [email or web address].    Your next appointment:   6 month(s)  The format for your next appointment:   In Person  Provider:   Dr. Vilinda Boehringer have been referred to Dr. Radford Pax for sleep issues.   Important Information About Sugar         Signed, Mertie Moores, MD  08/16/2022 6:12 PM    [place]

## 2022-08-16 ENCOUNTER — Telehealth: Payer: Self-pay | Admitting: *Deleted

## 2022-08-16 ENCOUNTER — Ambulatory Visit: Payer: Medicare HMO | Attending: Cardiology | Admitting: Cardiovascular Disease

## 2022-08-16 ENCOUNTER — Telehealth (HOSPITAL_COMMUNITY): Payer: Self-pay | Admitting: Licensed Clinical Social Worker

## 2022-08-16 ENCOUNTER — Encounter: Payer: Self-pay | Admitting: Cardiovascular Disease

## 2022-08-16 VITALS — BP 139/81 | HR 85 | Ht 67.0 in | Wt >= 6400 oz

## 2022-08-16 DIAGNOSIS — R0609 Other forms of dyspnea: Secondary | ICD-10-CM

## 2022-08-16 DIAGNOSIS — G4733 Obstructive sleep apnea (adult) (pediatric): Secondary | ICD-10-CM | POA: Diagnosis not present

## 2022-08-16 NOTE — Patient Instructions (Signed)
Medication Instructions:  Your physician recommends that you continue on your current medications as directed. Please refer to the Current Medication list given to you today.  *If you need a refill on your cardiac medications before your next appointment, please call your pharmacy*  Lab Work: If you have labs (blood work) drawn today and your tests are completely normal, you will receive your results only by: MyChart Message (if you have MyChart) OR A paper copy in the mail If you have any lab test that is abnormal or we need to change your treatment, we will call you to review the results.  Testing/Procedures: Your physician has requested that you have an echocardiogram. Echocardiography is a painless test that uses sound waves to create images of your heart. It provides your doctor with information about the size and shape of your heart and how well your heart's chambers and valves are working. This procedure takes approximately one hour. There are no restrictions for this procedure.  Your physician has recommended that you have a sleep study. This test records several body functions during sleep, including: brain activity, eye movement, oxygen and carbon dioxide blood levels, heart rate and rhythm, breathing rate and rhythm, the flow of air through your mouth and nose, snoring, body muscle movements, and chest and belly movement.  Follow-Up: At [facility], you and your health needs are our priority.  As part of our continuing mission to provide you with exceptional heart care, we have created designated Provider Care Teams.  These Care Teams include your primary Cardiologist (physician) and Advanced Practice Providers (APPs -  Physician Assistants and Nurse Practitioners) who all work together to provide you with the care you need, when you need it.  We recommend signing up for the patient portal called "MyChart".  Sign up information is provided on this After Visit Summary.  MyChart  is used to connect with patients for Virtual Visits (Telemedicine).  Patients are able to view lab/test results, encounter notes, upcoming appointments, etc.  Non-urgent messages can be sent to your provider as well.   To learn more about what you can do with MyChart, go to [email or web address].    Your next appointment:   6 month(s)  The format for your next appointment:   In Person  Provider:   Dr. Prescott Parma have been referred to Dr. Mayford Knife for sleep issues.   Important Information About Sugar

## 2022-08-16 NOTE — Telephone Encounter (Signed)
H&V Care Navigation CSW Progress Note  Clinical Social Worker consulted to speak with pt about concerns with being able to afford health food.  CSW called pt to discuss above concerns.  Pt reports that she has very limited funds for food and that she only gets $40 a month in food stamps.  Has been trying to get them increased but has had trouble providing verification for her rent increase- is working on this actively with [employer] and plans to turn in verification of her rent payments ASAP. Reports no assistance CSW can provide with this.  CSW sent pt list of local food pantries and also directed pt to call her [facility] Medicare plan to see if she has a Healthy Options Allowance which would provide her with a card to purchase healthy foods.    Pt reports no other SDOH concerns at this time.  SDOH Screenings   Food Insecurity: Food Insecurity Present (08/16/2022)  Housing: Low Risk  (11/19/2021)  Transportation Needs: No Transportation Needs (11/19/2021)  Alcohol Screen: Low Risk  (11/10/2020)  Depression (PHQ2-9): Low Risk  (07/26/2022)  Financial Resource Strain: Medium Risk (11/19/2021)  Physical Activity: Sufficiently Active (11/19/2021)  Social Connections: Socially Isolated (11/19/2021)  Stress: No Stress Concern Present (11/10/2020)  Tobacco Use: Medium Risk (08/16/2022)     Burna Sis, LCSW Clinical Social Worker [facility] Desk#: [phone] Cell#: [phone]

## 2022-08-16 NOTE — Telephone Encounter (Signed)
Pt set up with Itamar study today. Pt aware to not open the box until she haas been called with PIN#. Pt agreeable to signed waiver.

## 2022-08-19 ENCOUNTER — Ambulatory Visit: Payer: Medicare HMO | Admitting: Podiatry

## 2022-08-19 NOTE — Telephone Encounter (Signed)
Prior Authorization for ITAMAR sent to [employer] via web portal. Tracking Number . NO PA REQ 

## 2022-08-19 NOTE — Telephone Encounter (Signed)
Called and made the patient aware that she may proceed with the Itamar Home Sleep Study. PIN # provided to the patient. Patient made aware that she will be contacted after the test has been read with the results and any recommendations. Patient verbalized understanding and thanked me for the call.   Pt has been given PIN# [id number]. Pt will do sleep study one night this week.  

## 2022-08-20 ENCOUNTER — Encounter: Payer: Self-pay | Admitting: Podiatry

## 2022-08-23 ENCOUNTER — Ambulatory Visit: Payer: Medicare HMO | Admitting: Internal Medicine

## 2022-08-26 ENCOUNTER — Ambulatory Visit (INDEPENDENT_AMBULATORY_CARE_PROVIDER_SITE_OTHER): Payer: Medicare HMO | Admitting: Internal Medicine

## 2022-08-26 ENCOUNTER — Encounter: Payer: Self-pay | Admitting: Internal Medicine

## 2022-08-26 DIAGNOSIS — E1142 Type 2 diabetes mellitus with diabetic polyneuropathy: Secondary | ICD-10-CM | POA: Diagnosis not present

## 2022-08-26 DIAGNOSIS — I10 Essential (primary) hypertension: Secondary | ICD-10-CM | POA: Diagnosis not present

## 2022-08-26 DIAGNOSIS — E1165 Type 2 diabetes mellitus with hyperglycemia: Secondary | ICD-10-CM | POA: Diagnosis not present

## 2022-08-26 DIAGNOSIS — E113523 Type 2 diabetes mellitus with proliferative diabetic retinopathy with traction retinal detachment involving the macula, bilateral: Secondary | ICD-10-CM | POA: Diagnosis not present

## 2022-08-26 DIAGNOSIS — Z794 Long term (current) use of insulin: Secondary | ICD-10-CM | POA: Diagnosis not present

## 2022-08-26 MED ORDER — LEVEMIR FLEXPEN 100 UNIT/ML ~~LOC~~ SOPN: PEN_INJECTOR | SUBCUTANEOUS | 0 refills | Status: DC

## 2022-08-26 MED ORDER — SEMAGLUTIDE (1 MG/DOSE) 4 MG/3ML ~~LOC~~ SOPN: 1.0000 mg | PEN_INJECTOR | SUBCUTANEOUS | 3 refills | Status: DC

## 2022-08-26 NOTE — Patient Instructions (Signed)
Please start taking Levemir 60 units at bedtime instead of 70 units.  Please start taking Ozempic 1 mg as prescribed instead of 0.5 mg.  Please continue to follow low-carb diet.  Please bring the blood glucose log in the next visit.

## 2022-08-26 NOTE — Assessment & Plan Note (Signed)
Followed by retina clinic/Dr. Shah 

## 2022-08-26 NOTE — Assessment & Plan Note (Signed)
BP Readings from Last 1 Encounters:  08/16/22 139/81   Well-controlled with Lisinopril-HCTZ, Spironolactone and Chlorthalidone Could be a component of pain and OSA/OHS, had referred for sleep study, but has not set up appointment yet Counseled for compliance with the medications Advised DASH diet and moderate exercise/walking, at least 150 mins/week

## 2022-08-26 NOTE — Assessment & Plan Note (Signed)
On gabapentin, needs to be compliant 

## 2022-08-26 NOTE — Assessment & Plan Note (Signed)
Lab Results  Component Value Date   HGBA1C 9.4 (H) 05/20/2022   On Levemir 70 U QD and Novolog sliding scale States it stays around 100-150, with only few readings above 200 Had added Ozempic, increase dose as tolerated Decreased Levemir to 60 units nightly Tried to send Dexcom G7, but was not able to get it - advised to find preferred DME from insurance provider Saw Dr Dorris Fetch in the past, had referral sent again  Advised to follow diabetic diet On ACE and statin F/u CMP and lipid panel Diabetic eye exam: Advised to follow up with Ophthalmology for diabetic eye exam

## 2022-08-26 NOTE — Progress Notes (Signed)
Virtual Visit via Telephone Note   This visit type was conducted due to national recommendations for restrictions regarding the COVID-19 Pandemic (e.g. social distancing) in an effort to limit this patient's exposure and mitigate transmission in our community.  Due to her co-morbid illnesses, this patient is at least at moderate risk for complications without adequate follow up.  This format is felt to be most appropriate for this patient at this time.  The patient did not have access to video technology/had technical difficulties with video requiring transitioning to audio format only (telephone).  All issues noted in this document were discussed and addressed.  No physical exam could be performed with this format.  Evaluation Performed:  Follow-up visit  Date:  08/26/2022   ID:  Rachel George, Rachel George 1981/12/02, MRN [id number]  Patient Location: Home Provider Location: Office/Clinic  Participants: Patient Location of Patient: Home Location of Provider: Telehealth Consent was obtain for visit to be over via telehealth. I verified that I am speaking with the correct person using two identifiers.  PCP:  Lindell Spar, MD   Chief Complaint: Follow-up of DM and HTN  History of Present Illness:    Rachel George is a 41 y.o. female with past medical history of HTN, uncontrolled DM with polyneuropathy and retinopathy, HLD, GERD and morbid obesity who has a televisit for f/u of DM and HTN.  HTN: Her BP has been well controlled at home.  Her BP was WNL during recent cardiology visit.  She denies any headache, dizziness, chest pain or palpitations.  Type II DM: She has started taking Ozempic and has been tolerating it well.  She has decreased Levemir to 70 units nightly as advised, and reports that her blood glucose has been between 100-150 most of the time.  Denies any episode of hypoglycemia recently.  The patient does not have symptoms concerning for COVID-19 infection  (fever, chills, cough, or new shortness of breath).   Past Medical, Surgical, Social History, Allergies, and Medications have been Reviewed.  Past Medical History:  Diagnosis Date   Anemia    Cataract    Cellulitis of toe of right foot 10/07/2016   Depression    Diabetes mellitus without complication [facility])    dx age 60   Diabetic foot ulcer ([place]) 10/15/2016   Fatty liver disease, nonalcoholic    Glaucoma    Hyperlipidemia    Hypertension    Macular degeneration    Morbid obesity with BMI of 50.0-59.9, adult ([place]) 05/01/2017   Neuropathy    Obesity, Class III, BMI 40-49.9 (morbid obesity) ([place]) 05/01/2017   Osteolysis, right ankle and foot 05/01/2017   Osteomyelitis ([place]) 12/13/2016   Partial blindness    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   bacitracin-polymyxin b (POLYSPORIN) ophthalmic ointment PLACE OINTMENT INTO THE RIGHT EYE FOUR TIMES DAILY   Blood Glucose Monitoring  Suppl (ACCU-CHEK GUIDE ME) w/Device KIT USE TESTING SUPPLIES TO TEST 4 TIMES DAILY.   chlorthalidone (HYGROTON) 25 MG tablet Take 1 tablet (25 mg total) by mouth daily.   Continuous Blood Gluc Receiver ([place]) DEVI Please use it to check blood glucose as directed.   Continuous Blood Gluc Sensor (DEXCOM G7 SENSOR) MISC Please use it to check blood glucose as directed.   diclofenac Sodium (VOLTAREN) 1 % GEL Apply 2 g topically 3 (three) times daily as needed.    dorzolamide-timolol (COSOPT) 22.3-6.8 MG/ML ophthalmic solution    ferrous sulfate 324 MG TBEC Take 65 mg by mouth.   fluticasone (FLONASE) 50 MCG/ACT nasal spray Place 2 sprays into both nostrils daily.   gabapentin (NEURONTIN) 300 MG capsule Take 1 capsule (300 mg total) by mouth 2 (two) times daily. For pain   gentamicin cream (GARAMYCIN) 0.1 % Apply 1 Application topically 2 (two) times daily.   insulin detemir (LEVEMIR FLEXPEN) 100 UNIT/ML FlexPen INJECT 80 UNITS UNDER THE SKIN AT BEDTIME   loperamide (IMODIUM) 2 MG capsule TAKE 1 CAPSULE BY MOUTH EVERY 6 HOURS   LUMIGAN 0.01 % SOLN INSTILL 1 DROP INTO LEFT EYE ONCE DAILY   mupirocin ointment (BACTROBAN) 2 % Apply 1 application topically 2 (two) times daily.   NOVOLOG FLEXPEN 100 UNIT/ML FlexPen INJECT 14 TO 20 UNITS INTO THE SKIN 3 (THREE) TIMES DAILY WITH MEALS.   olopatadine (PATANOL) 0.1 % ophthalmic solution Place 1 drop into both eyes 2 times daily.   omeprazole (PRILOSEC) 40 MG capsule Take 1 capsule (40 mg total) by mouth daily.   Probiotic Product (BACID) CAPS Take 1 capsule by mouth in the morning and at bedtime.   Semaglutide,0.25 or 0.5MG /DOS, (OZEMPIC, 0.25 OR 0.5 MG/DOSE,) 2 MG/3ML SOPN Inject 0.5 mg into the skin every 7 (seven) days.   silver sulfADIAZINE (SILVADENE) 1 % cream Apply 1 Application topically daily.   spironolactone (ALDACTONE) 50 MG tablet TAKE 1 TABLET TWICE DAILY   Vitamin D, Ergocalciferol, (DRISDOL) 1.25 MG (50000 UNIT) CAPS capsule TAKE 1 CAPSULE EVERY WEEK     Allergies:   Other, Shellfish allergy, Oxycodone, and Percocet [oxycodone-acetaminophen]   ROS:   Please see the history of present illness.     All other systems reviewed and are negative.   Labs/Other Tests and Data Reviewed:    Recent Labs: 05/20/2022: ALT 10; BNP 6.8; BUN 26; Creatinine, Ser 1.45; Hemoglobin 11.0; Platelets 222; Potassium 5.0; Sodium 139; TSH 1.420   Recent Lipid Panel Lab Results  Component Value Date/Time   CHOL  116 05/20/2022 08:36 AM   TRIG 128 05/20/2022 08:36 AM   HDL 43 05/20/2022 08:36 AM   CHOLHDL 2.7 05/20/2022 08:36 AM   CHOLHDL 3.9 01/26/2020 02:47 PM   LDLCALC 50 05/20/2022 08:36 AM   LDLCALC 134 (H) 01/26/2020 02:47 PM    Wt Readings from Last 3 Encounters:  08/16/22 (!) 435 lb 6.4 oz (197.5 kg)  07/26/22 (!) 442 lb (200.5 kg)  05/09/22 (!) 439 lb 12.8 oz (199.5 kg)     ASSESSMENT & PLAN:    Essential hypertension, benign BP Readings from Last 1 Encounters:  08/16/22 139/81   Well-controlled with Lisinopril-HCTZ, Spironolactone and Chlorthalidone Could be a component of pain and OSA/OHS, had referred for sleep study, but has not set up appointment yet Counseled for compliance with the medications Advised DASH diet and moderate exercise/walking, at least 150 mins/week  Diabetic polyneuropathy associated with type 2 diabetes mellitus ([place]) On gabapentin, needs  to be compliant  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 Manuella Ghazi  Uncontrolled type 2 diabetes mellitus with hyperglycemia, with long-term current use of insulin (HCC) Lab Results  Component Value Date   HGBA1C 9.4 (H) 05/20/2022   On Levemir 70 U QD and Novolog sliding scale States it stays around 100-150, with only few readings above 200 Had added Ozempic, increase dose as tolerated Decreased Levemir to 60 units nightly Tried to send Dexcom G7, but was not able to get it - advised to find preferred DME from insurance provider Saw Dr Dorris Fetch in the past, had referral sent again  Advised to follow diabetic diet On ACE and statin F/u CMP and lipid panel Diabetic eye exam: Advised to follow up with Ophthalmology for diabetic eye exam   Time:   Today, I have spent 19 minutes reviewing the chart, including problem list, medications, and with the patient with telehealth  technology discussing the above problems.   Medication Adjustments/Labs and Tests Ordered: Current medicines are reviewed at length with the patient today.  Concerns regarding medicines are outlined above.   Tests Ordered: No orders of the defined types were placed in this encounter.   Medication Changes: No orders of the defined types were placed in this encounter.    Note: This dictation was prepared with Dragon dictation along with smaller phrase technology. Similar sounding words can be transcribed inadequately or may not be corrected upon review. Any transcriptional errors that result from this process are unintentional.      Disposition:  Follow up  Signed, Lindell Spar, MD  08/26/2022 12:04 PM     [place]

## 2022-08-28 ENCOUNTER — Encounter: Payer: Medicare HMO | Admitting: Vascular Surgery

## 2022-08-29 ENCOUNTER — Ambulatory Visit (HOSPITAL_COMMUNITY): Payer: Medicare HMO | Attending: Cardiology

## 2022-08-29 DIAGNOSIS — R0609 Other forms of dyspnea: Secondary | ICD-10-CM | POA: Insufficient documentation

## 2022-08-29 DIAGNOSIS — G4733 Obstructive sleep apnea (adult) (pediatric): Secondary | ICD-10-CM | POA: Insufficient documentation

## 2022-08-29 LAB — ECHOCARDIOGRAM COMPLETE
Area-P 1/2: 3.83 cm2
S' Lateral: 3.1 cm

## 2022-09-04 ENCOUNTER — Encounter: Payer: Medicare HMO | Admitting: Vascular Surgery

## 2022-09-05 ENCOUNTER — Telehealth: Payer: Self-pay | Admitting: Internal Medicine

## 2022-09-05 NOTE — Telephone Encounter (Signed)
PCS forms   Noted  Copied  Sleeved   Original in brown folder  Copy in brown folder   appt 10/13

## 2022-09-09 ENCOUNTER — Ambulatory Visit: Payer: Medicare HMO | Admitting: Family Medicine

## 2022-09-11 ENCOUNTER — Ambulatory Visit: Payer: Medicare HMO | Admitting: Podiatry

## 2022-09-13 ENCOUNTER — Ambulatory Visit: Payer: Medicare HMO | Admitting: Internal Medicine

## 2022-09-16 ENCOUNTER — Encounter: Payer: Self-pay | Admitting: Internal Medicine

## 2022-09-16 ENCOUNTER — Ambulatory Visit (INDEPENDENT_AMBULATORY_CARE_PROVIDER_SITE_OTHER): Payer: Medicare HMO | Admitting: Internal Medicine

## 2022-09-16 VITALS — BP 138/86 | HR 91 | Resp 18 | Ht 67.0 in | Wt >= 6400 oz

## 2022-09-16 DIAGNOSIS — I10 Essential (primary) hypertension: Secondary | ICD-10-CM | POA: Diagnosis not present

## 2022-09-16 DIAGNOSIS — G4733 Obstructive sleep apnea (adult) (pediatric): Secondary | ICD-10-CM | POA: Diagnosis not present

## 2022-09-16 DIAGNOSIS — E1165 Type 2 diabetes mellitus with hyperglycemia: Secondary | ICD-10-CM

## 2022-09-16 DIAGNOSIS — Z794 Long term (current) use of insulin: Secondary | ICD-10-CM | POA: Diagnosis not present

## 2022-09-16 DIAGNOSIS — Z89421 Acquired absence of other right toe(s): Secondary | ICD-10-CM | POA: Diagnosis not present

## 2022-09-16 DIAGNOSIS — Z1159 Encounter for screening for other viral diseases: Secondary | ICD-10-CM | POA: Diagnosis not present

## 2022-09-16 DIAGNOSIS — Z23 Encounter for immunization: Secondary | ICD-10-CM

## 2022-09-16 MED ORDER — LEVEMIR FLEXPEN 100 UNIT/ML ~~LOC~~ SOPN: PEN_INJECTOR | SUBCUTANEOUS | 0 refills | Status: DC

## 2022-09-16 MED ORDER — SEMAGLUTIDE (2 MG/DOSE) 8 MG/3ML ~~LOC~~ SOPN: 2.0000 mg | PEN_INJECTOR | SUBCUTANEOUS | 3 refills | Status: DC

## 2022-09-16 NOTE — Progress Notes (Unsigned)
Established Patient Office Visit  Subjective:  Patient ID: Rachel George, female    DOB: [birth year]  Age: 41 y.o. MRN: [id number]  CC:  Chief Complaint  Patient presents with   Follow-up    Pt needs a dr note for dss needing elctricity to survive also needs pcs forms filled out     HPI Rachel George is a 41 y.o. female with past medical history of HTN, uncontrolled DM with polyneuropathy and retinopathy, HLD, GERD and morbid obesity who presents for f/u of her chronic medical conditions.  HTN: Her blood pressure was slightly elevated in the office today.  She has been taking lisinopril HCTZ and spironolactone.  Compliance is questionable.  Of note, she likely has OSA/OHS, for which she was referred to sleep specialist, but has not done sleep study yet.  She denies any chest pain, or palpitations.  DM: Her HbA1c was 9.4 in 06/23.  She has been taking Levemir 60 units and NovoLog as per sliding scale.  She also takes Ozempic 1 mg qw, and has had about 8 lbs weight loss since 06/23.  She checks her blood glucose before meals, and states it stays around 100-150 most of the time.  She has to take NovoLog 9 units before mealtime without any additional need for dose of insulin. She has not set up appointment with Dr. Dorris Fetch yet since last visit.  She states that she has been following low-carb diet.  Denies any polyuria or polyphagia currently.  She has brought paperwork for personal aide.  She also requests letter for continuous electricity need, which is necessary for her chronic medical conditions.  Past Medical History:  Diagnosis Date   Anemia    Cataract    Cellulitis of toe of right foot 10/07/2016   Depression    Diabetes mellitus without complication [facility])    dx age 5   Diabetic foot ulcer ([place]) 10/15/2016   Fatty liver disease, nonalcoholic    Glaucoma    Hyperlipidemia    Hypertension    Macular degeneration    Morbid obesity with BMI of 50.0-59.9, adult ([place])  05/01/2017   Neuropathy    Obesity, Class III, BMI 40-49.9 (morbid obesity) ([place]) 05/01/2017   Osteolysis, right ankle and foot 05/01/2017   Osteomyelitis ([place]) 12/13/2016   Partial blindness    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   Suicidal ideation 11/04/2017    Past Surgical History:  Procedure Laterality Date   AMPUTATION TOE Right 12/16/2016   Procedure: RIGHT FIRST TOE AMPUTATION;  Surgeon: Vickie Epley, MD;  Location: [facility];  Service: General;  Laterality: Right;  Right first toe amputation for osteomyelitis   eye lid transplant     RETINAL LASER PROCEDURE      Family History  Problem Relation Age of Onset   Diabetes Mother    Thyroid disease Mother    Diabetes Father     Social History   Socioeconomic History   Marital status: Legally Separated    Spouse name: Hydeia Mcatee   Number of children: 0   Years of education: Not on file   Highest education level: High school graduate  Occupational History   Occupation: CNA personal care giver  Tobacco Use   Smoking status: Former    Packs/day: 0.25    Types: Cigarettes    Quit date: 12/11/2016    Years since quitting: 5.7   Smokeless tobacco: Never  Vaping Use   Vaping Use: Never used  Substance and Sexual Activity   Alcohol use: No   Drug use: No   Sexual activity: Yes    Partners: Male    Birth control/protection: None  Other Topics Concern   Not on file  Social History Narrative   Separated from husband-married for 7 years      Lives alone right now, in a disability apartment.   Right handed      Enjoys walking       Diet: eat one meal a day- veggies, meats, low carb    Caffeine: tea-4 cups daily    Water: 4-6 bottles of water      Wears seat belt   Smoke detectors    Does not use phone while driving   Social Determinants of Health   Financial Resource Strain: Medium Risk (11/19/2021)   Overall Financial Resource Strain (CARDIA)    Difficulty of Paying Living Expenses: Somewhat hard   Food Insecurity: Food Insecurity Present (08/16/2022)   Hunger Vital Sign    Worried About Running Out of Food in the Last Year: Sometimes true    Ran Out of Food in the Last Year: Sometimes true  Transportation Needs: No Transportation Needs (11/19/2021)   PRAPARE - Hydrologist (Medical): No    Lack of Transportation (Non-Medical): No  Physical Activity: Sufficiently Active (11/19/2021)   Exercise Vital Sign    Days of Exercise per Week: 4 days    Minutes of Exercise per Session: 60 min  Stress: No Stress Concern Present (11/10/2020)   [place]    Feeling of Stress : Only a little  Social Connections: Socially Isolated (11/19/2021)   Social Connection and Isolation Panel [NHANES]    Frequency of Communication with Friends and Family: Three times a week    Frequency of Social Gatherings with Friends and Family: Twice a week    Attends Religious Services: Never    Marine scientist or Organizations: No    Attends Archivist Meetings: Never    Marital Status: Never married  Intimate Partner Violence: Not At Risk (11/10/2020)   Humiliation, Afraid, Rape, and Kick questionnaire    Fear of Current or Ex-Partner: No    Emotionally Abused: No    Physically Abused: No    Sexually Abused: No    Outpatient Medications Prior to Visit  Medication Sig Dispense Refill   atorvastatin (LIPITOR) 20 MG tablet TAKE 1 TABLET BY MOUTH ONCE DAILY AT  6  PM 90 tablet 0   bacitracin ointment Apply 1 Application topically 2 (two) times daily. 120 g 0   bacitracin-polymyxin b (POLYSPORIN) ophthalmic ointment PLACE OINTMENT INTO THE RIGHT EYE FOUR TIMES DAILY     Blood Glucose Monitoring Suppl (ACCU-CHEK GUIDE ME) w/Device KIT USE TESTING SUPPLIES TO TEST 4 TIMES DAILY. 1 kit 0   chlorthalidone (HYGROTON) 25 MG tablet Take 1 tablet (25 mg total) by mouth daily. 90 tablet 0   Continuous Blood  Gluc Receiver ([place]) DEVI Please use it to check blood glucose as directed. 1 each 0   Continuous Blood Gluc Sensor (DEXCOM G7 SENSOR) MISC Please use it to check blood glucose as directed. 3 each 5   diclofenac Sodium (VOLTAREN) 1 % GEL Apply 2 g topically 3 (three) times daily as needed. 50 g 0   dorzolamide-timolol (COSOPT) 22.3-6.8 MG/ML ophthalmic solution      ferrous sulfate 324 MG TBEC Take 65 mg  by mouth.     fluticasone (FLONASE) 50 MCG/ACT nasal spray Place 2 sprays into both nostrils daily. 16 g 6   gabapentin (NEURONTIN) 300 MG capsule Take 1 capsule (300 mg total) by mouth 2 (two) times daily. For pain 60 capsule 5   gentamicin cream (GARAMYCIN) 0.1 % Apply 1 Application topically 2 (two) times daily. 30 g 1   loperamide (IMODIUM) 2 MG capsule TAKE 1 CAPSULE BY MOUTH EVERY 6 HOURS 30 capsule 0   LUMIGAN 0.01 % SOLN INSTILL 1 DROP INTO LEFT EYE ONCE DAILY     mupirocin ointment (BACTROBAN) 2 % Apply 1 application topically 2 (two) times daily. 22 g 0   NOVOLOG FLEXPEN 100 UNIT/ML FlexPen INJECT 14 TO 20 UNITS INTO THE SKIN 3 (THREE) TIMES DAILY WITH MEALS. 60 mL 0   olopatadine (PATANOL) 0.1 % ophthalmic solution Place 1 drop into both eyes 2 times daily.     omeprazole (PRILOSEC) 40 MG capsule Take 1 capsule (40 mg total) by mouth daily. 30 capsule 5   Probiotic Product (BACID) CAPS Take 1 capsule by mouth in the morning and at bedtime. 30 capsule 2   silver sulfADIAZINE (SILVADENE) 1 % cream Apply 1 Application topically daily. 50 g 0   spironolactone (ALDACTONE) 50 MG tablet TAKE 1 TABLET TWICE DAILY 180 tablet 0   Vitamin D, Ergocalciferol, (DRISDOL) 1.25 MG (50000 UNIT) CAPS capsule TAKE 1 CAPSULE EVERY WEEK 12 capsule 0   insulin detemir (LEVEMIR FLEXPEN) 100 UNIT/ML FlexPen INJECT 60 UNITS UNDER THE SKIN AT BEDTIME 60 mL 0   Semaglutide, 1 MG/DOSE, 4 MG/3ML SOPN Inject 1 mg as directed once a week. 3 mL 3   lisinopril-hydrochlorothiazide (ZESTORETIC) 20-12.5 MG  tablet Take 1 tablet by mouth daily. 90 tablet 0   No facility-administered medications prior to visit.    Allergies  Allergen Reactions   Other Anaphylaxis   Shellfish Allergy Anaphylaxis, Shortness Of Breath and Swelling    Facial Swelling   Oxycodone Nausea And Vomiting   Percocet [Oxycodone-Acetaminophen] Itching and Nausea And Vomiting    ROS Review of Systems  Constitutional:  Positive for fatigue. Negative for chills and fever.  HENT:  Negative for congestion, sinus pressure, sinus pain and sore throat.   Eyes:  Positive for visual disturbance. Negative for pain and discharge.  Respiratory:  Positive for shortness of breath. Negative for cough.   Cardiovascular:  Negative for chest pain and palpitations.  Gastrointestinal:  Negative for nausea and vomiting.  Endocrine: Negative for polydipsia and polyuria.  Genitourinary:  Negative for dysuria and hematuria.  Musculoskeletal:  Negative for neck pain and neck stiffness.  Skin:  Positive for rash.  Neurological:  Negative for dizziness and weakness.  Psychiatric/Behavioral:  Positive for sleep disturbance. Negative for agitation and behavioral problems.       Objective:    Physical Exam Vitals reviewed.  Constitutional:      General: She is not in acute distress.    Appearance: She is obese. She is not diaphoretic.  HENT:     Head: Normocephalic and atraumatic.     Nose: Nose normal. No congestion.     Mouth/Throat:     Mouth: Mucous membranes are moist.     Pharynx: No posterior oropharyngeal erythema.  Eyes:     General: No scleral icterus. Cardiovascular:     Rate and Rhythm: Normal rate and regular rhythm.     Heart sounds: Normal heart sounds. No murmur heard.    Comments: DPA  1+ b/l Pulmonary:     Breath sounds: No wheezing or rales.     Comments: Decreased breath sounds in lower lung fields Abdominal:     Palpations: Abdomen is soft.     Tenderness: There is no abdominal tenderness.   Musculoskeletal:     Cervical back: Neck supple. No tenderness.     Comments: S/p amputation of great toe - right LE  Skin:    General: Skin is warm.  Neurological:     General: No focal deficit present.     Mental Status: She is alert and oriented to person, place, and time.     Sensory: Sensory deficit (B/l feet) present.     Motor: Weakness (B/l feet - 4/5) present.  Psychiatric:        Mood and Affect: Mood normal.        Behavior: Behavior normal.     BP 138/86 (BP Location: Left Arm, Patient Position: Sitting, Cuff Size: Normal)   Pulse 91   Resp 18   Ht 5\' 7"  (1.702 m)   Wt (!) 434 lb (196.9 kg)   SpO2 99%   BMI 67.97 kg/m  Wt Readings from Last 3 Encounters:  09/16/22 (!) 434 lb (196.9 kg)  08/16/22 (!) 435 lb 6.4 oz (197.5 kg)  07/26/22 (!) 442 lb (200.5 kg)    Lab Results  Component Value Date   TSH 1.420 05/20/2022   Lab Results  Component Value Date   WBC 8.7 05/20/2022   HGB 11.0 (L) 05/20/2022   HCT 34.9 05/20/2022   MCV 81 05/20/2022   PLT 222 05/20/2022   Lab Results  Component Value Date   NA 138 09/16/2022   K 5.3 (H) 09/16/2022   CO2 19 (L) 09/16/2022   GLUCOSE 177 (H) 09/16/2022   BUN 21 09/16/2022   CREATININE 1.62 (H) 09/16/2022   BILITOT 0.2 09/16/2022   ALKPHOS 112 09/16/2022   AST 12 09/16/2022   ALT 10 09/16/2022   PROT 7.8 09/16/2022   ALBUMIN 4.2 09/16/2022   CALCIUM 9.0 09/16/2022   ANIONGAP 12 07/22/2019   EGFR 40 (L) 09/16/2022   Lab Results  Component Value Date   CHOL 116 05/20/2022   Lab Results  Component Value Date   HDL 43 05/20/2022   Lab Results  Component Value Date   LDLCALC 50 05/20/2022   Lab Results  Component Value Date   TRIG 128 05/20/2022   Lab Results  Component Value Date   CHOLHDL 2.7 05/20/2022   Lab Results  Component Value Date   HGBA1C 9.1 (H) 09/16/2022      Assessment & Plan:   Problem List Items Addressed This Visit       Cardiovascular and Mediastinum   Essential  hypertension, benign    BP Readings from Last 1 Encounters:  09/16/22 138/86  Well-controlled with Lisinopril-HCTZ, Spironolactone and Chlorthalidone Could be a component of pain and OSA/OHS, had referred for sleep study, but has not not had sleep study yet Counseled for compliance with the medications Advised DASH diet and moderate exercise/walking, at least 150 mins/week        Respiratory   OSA (obstructive sleep apnea)    Had tried to refer her to sleep specialist Needs to do home sleep study         Endocrine   Uncontrolled type 2 diabetes mellitus with hyperglycemia, with long-term current use of insulin (HCC) - Primary    Lab Results  Component Value Date  HGBA1C 9.1 (H) 09/16/2022  Uncontrolled On Levemir 60 U QD and Novolog sliding scale States it stays around 100-150, with only few readings above 200 Had added Ozempic, increase dose to 2 mg qw Decreased Levemir to 50 units nightly Tried to send Dexcom G7, but was not able to get it - advised to find preferred DME from insurance provider Saw Dr Dorris Fetch in the past, had referral sent again  Advised to follow diabetic diet On ACE and statin F/u CMP and lipid panel Diabetic eye exam: Advised to follow up with Ophthalmology for diabetic eye exam      Relevant Medications   Semaglutide, 2 MG/DOSE, 8 MG/3ML SOPN   insulin detemir (LEVEMIR FLEXPEN) 100 UNIT/ML FlexPen   Other Relevant Orders   Microalbumin / creatinine urine ratio   CMP14+EGFR (Completed)   Hemoglobin A1c (Completed)     Other   Morbid obesity ([place])    Contributing to uncontrolled DM and HTN Likely has OSA/OHS Added Ozempic for type 2 DM Emphasized to comply with diet recs      Relevant Medications   Semaglutide, 2 MG/DOSE, 8 MG/3ML SOPN   insulin detemir (LEVEMIR FLEXPEN) 100 UNIT/ML FlexPen   Other Visit Diagnoses     Need for immunization against influenza       Relevant Orders   Flu Vaccine QUAD 75mo+IM (Fluarix, Fluzone & Alfiuria  Quad PF) (Completed)   Need for hepatitis C screening test       Relevant Orders   Hepatitis C Antibody (Completed)       Meds ordered this encounter  Medications   Semaglutide, 2 MG/DOSE, 8 MG/3ML SOPN    Sig: Inject 2 mg as directed once a week.    Dispense:  3 mL    Refill:  3    DOSE CHANGE   insulin detemir (LEVEMIR FLEXPEN) 100 UNIT/ML FlexPen    Sig: INJECT 50 UNITS UNDER THE SKIN AT BEDTIME    Dispense:  60 mL    Refill:  0    Follow-up: Return in about 3 months (around 12/17/2022) for Annual physical.    Lindell Spar, MD

## 2022-09-16 NOTE — Patient Instructions (Signed)
Please start taking Ozempic 2 mg instead of 1 mg dose. Please follow small, frequent meals to avoid nausea.  Please take Levemir 50 U at bedtime instead of 60 U once you start taking Ozempic 2 mg dose.  Please follow low carb diet and ambulate as tolerated.  Please get sleep study done and contact sleep specialist for follow up after it.

## 2022-09-17 ENCOUNTER — Other Ambulatory Visit: Payer: Self-pay | Admitting: *Deleted

## 2022-09-17 DIAGNOSIS — N289 Disorder of kidney and ureter, unspecified: Secondary | ICD-10-CM

## 2022-09-17 LAB — CMP14+EGFR
ALT: 10 IU/L (ref 0–32)
AST: 12 IU/L (ref 0–40)
Albumin/Globulin Ratio: 1.2 (ref 1.2–2.2)
Albumin: 4.2 g/dL (ref 3.9–4.9)
Alkaline Phosphatase: 112 IU/L (ref 44–121)
BUN/Creatinine Ratio: 13 (ref 9–23)
BUN: 21 mg/dL (ref 6–24)
Bilirubin Total: 0.2 mg/dL (ref 0.0–1.2)
CO2: 19 mmol/L — ABNORMAL LOW (ref 20–29)
Calcium: 9 mg/dL (ref 8.7–10.2)
Chloride: 106 mmol/L (ref 96–106)
Creatinine, Ser: 1.62 mg/dL — ABNORMAL HIGH (ref 0.57–1.00)
Globulin, Total: 3.6 g/dL (ref 1.5–4.5)
Glucose: 177 mg/dL — ABNORMAL HIGH (ref 70–99)
Potassium: 5.3 mmol/L — ABNORMAL HIGH (ref 3.5–5.2)
Sodium: 138 mmol/L (ref 134–144)
Total Protein: 7.8 g/dL (ref 6.0–8.5)
eGFR: 40 mL/min/{1.73_m2} — ABNORMAL LOW (ref >59)

## 2022-09-17 LAB — HEMOGLOBIN A1C
Est. average glucose Bld gHb Est-mCnc: 214 mg/dL
Hgb A1c MFr Bld: 9.1 % — ABNORMAL HIGH (ref 4.8–5.6)

## 2022-09-17 LAB — HEPATITIS C ANTIBODY: Hep C Virus Ab: NONREACTIVE

## 2022-09-17 NOTE — Assessment & Plan Note (Signed)
Contributing to uncontrolled DM and HTN Likely has OSA/OHS Added Ozempic for type 2 DM Emphasized to comply with diet recs

## 2022-09-17 NOTE — Assessment & Plan Note (Signed)
BP Readings from Last 1 Encounters:  09/16/22 138/86   Well-controlled with Lisinopril-HCTZ, Spironolactone and Chlorthalidone Could be a component of pain and OSA/OHS, had referred for sleep study, but has not not had sleep study yet Counseled for compliance with the medications Advised DASH diet and moderate exercise/walking, at least 150 mins/week

## 2022-09-17 NOTE — Assessment & Plan Note (Addendum)
Lab Results  Component Value Date   HGBA1C 9.1 (H) 09/16/2022   Uncontrolled On Levemir 60 U QD and Novolog sliding scale States it stays around 100-150, with only few readings above 200 Had added Ozempic, increase dose to 2 mg qw Decreased Levemir to 50 units nightly Tried to send Dexcom G7, but was not able to get it - advised to find preferred DME from insurance provider Saw Dr Nida in the past, had referral sent again  Advised to follow diabetic diet On ACE and statin F/u CMP and lipid panel Diabetic eye exam: Advised to follow up with Ophthalmology for diabetic eye exam 

## 2022-09-17 NOTE — Assessment & Plan Note (Signed)
Had tried to refer her to sleep specialist Needs to do home sleep study

## 2022-09-18 LAB — MICROALBUMIN / CREATININE URINE RATIO
Creatinine, Urine: 119 mg/dL
Microalb/Creat Ratio: 380 mg/g creat — ABNORMAL HIGH (ref 0–29)
Microalbumin, Urine: 452.4 ug/mL

## 2022-09-23 ENCOUNTER — Ambulatory Visit: Payer: Medicare HMO | Admitting: Family Medicine

## 2022-09-23 ENCOUNTER — Other Ambulatory Visit: Payer: Self-pay | Admitting: Internal Medicine

## 2022-09-23 DIAGNOSIS — E785 Hyperlipidemia, unspecified: Secondary | ICD-10-CM

## 2022-09-23 DIAGNOSIS — I152 Hypertension secondary to endocrine disorders: Secondary | ICD-10-CM

## 2022-09-23 DIAGNOSIS — R195 Other fecal abnormalities: Secondary | ICD-10-CM

## 2022-09-25 ENCOUNTER — Telehealth: Payer: Self-pay | Admitting: *Deleted

## 2022-09-25 NOTE — Telephone Encounter (Signed)
I s/w the pt and she wanted to let us know she did not do her sleep study yet, see notes from 08/16/22; that her sister had become very sick and she has been at the hospital with her sister. Pt wanted to be sure she was not billed for not doing the study yet. I said no she has not but if she will do her study tonight that will be fine. Pt is agreeable to plan of care. Pt thanked me for the call back and for [place] helping her.

## 2022-09-25 NOTE — Telephone Encounter (Signed)
I s/w the pt and she wanted to let us know she did not do her sleep study yet, see notes from 08/16/22; that her sister had become very sick and she has been at the hospital with her sister. Pt wanted to be sure she was not billed for not doing the study yet. I said no she has not but if she will do her study tonight that will be fine. Pt is agreeable to plan of care. Pt thanked me for the call back and for us helping her.  

## 2022-09-25 NOTE — Telephone Encounter (Signed)
Patient is calling about sleep study. Please call back at [phone]

## 2022-09-27 NOTE — Telephone Encounter (Signed)
Left message for the pt I need her to do her sleep study by this weekend. See previous notes. If no results by early next week , will be sent to billing.

## 2022-10-01 ENCOUNTER — Encounter (HOSPITAL_BASED_OUTPATIENT_CLINIC_OR_DEPARTMENT_OTHER): Payer: Medicare HMO | Admitting: Cardiology

## 2022-10-01 DIAGNOSIS — G4733 Obstructive sleep apnea (adult) (pediatric): Secondary | ICD-10-CM | POA: Diagnosis not present

## 2022-10-02 NOTE — Procedures (Signed)
SLEEP STUDY REPORT Patient Information Study Date: 09/28/22 Patient Name: Rachel George Patient ID: [id number] Birth Date: 13-Sep-1981 Age: 41 Gender: Female BMI: 68.2 (W=434 lb, H=5' 7'') Stopbang: 6 Referring Physician: Mertie Moores, MD  TEST DESCRIPTION:  Home sleep apnea testing was completed using the WatchPat, a Type 1 device, utilizing peripheral arterial tonometry (PAT), chest movement, actigraphy, pulse oximetry, pulse rate, body position and snore.  AHI was calculated with apnea and hypopnea using valid sleep time as the denominator. RDI includes apneas, hypopneas, and RERAs.  The data acquired and the scoring of sleep and all associated events were performed in accordance with the recommended standards and specifications as outlined in the AASM Manual for the Scoring of Sleep and Associated Events 2.2.0 (2015).  FINDINGS:  1.  Moderate Obstructive Sleep Apnea with AHI 37/hr.   2.  No Central Sleep Apnea with pAHIc 2.8/hr.  3.  Oxygen desaturations as low as 60%.  4.  Severe snoring was present. O2 sats were < 88% for 77 min.  5.  Total sleep time was 4 hrs and 16 min.  6.  13.4% of total sleep time was spent in REM sleep.   7.  Normal sleep onset latency at 22 min  8.  Prolonged REM sleep onset latency at 217 min.   9.  Total awakenings were 20.  10. Arrhythmia detection:  None.  DIAGNOSIS:   Moderate Obstructive Sleep Apnea (G47.33) Nocturnal Hypoxemia  RECOMMENDATIONS:   1.  Clinical correlation of these findings is necessary.  The decision to treat obstructive sleep apnea (OSA) is usually based on the presence of apnea symptoms or the presence of associated medical conditions such as Hypertension, Congestive Heart Failure, Atrial Fibrillation or Obesity.  The most common symptoms of OSA are snoring, gasping for breath while sleeping, daytime sleepiness and fatigue.   2.  Initiating apnea therapy is recommended given the presence of symptoms and/or associated  conditions. Recommend proceeding with one of the following:     a.  Auto-CPAP therapy with a pressure range of 5-20cm H2O.     b.  An oral appliance (OA) that can be obtained from certain dentists with expertise in sleep medicine.  These are primarily of use in non-obese patients with mild and moderate disease.     c.  An ENT consultation which may be useful to look for specific causes of obstruction and possible treatment options.     d.  If patient is intolerant to PAP therapy, consider referral to ENT for evaluation for hypoglossal nerve stimulator.   3.  Close follow-up is necessary to ensure success with CPAP or oral appliance therapy for maximum benefit.  4.  A follow-up oximetry study on CPAP is recommended to assess the adequacy of therapy and determine the need for supplemental oxygen or the potential need for Bi-level therapy.  An arterial blood gas to determine the adequacy of baseline ventilation and oxygenation should also be considered.  5.  Healthy sleep recommendations include:  adequate nightly sleep (normal 7-9 hrs/night), avoidance of caffeine after noon and alcohol near bedtime, and maintaining a sleep environment that is cool, dark and quiet.  6.  Weight loss for overweight patients is recommended.  Even modest amounts of weight loss can significantly improve the severity of sleep apnea.  7.  Snoring recommendations include:  weight loss where appropriate, side sleeping, and avoidance of alcohol before bed.  8.  Operation of motor vehicle should not be performed when sleepy.  Signature: Krissy Orebaugh  Mayford Knife, MD; [facility]; Diplomat, American Board of Sleep Medicine Electronically Signed: 10/02/22

## 2022-10-03 ENCOUNTER — Ambulatory Visit: Payer: Medicare HMO | Attending: Cardiovascular Disease

## 2022-10-03 DIAGNOSIS — G4733 Obstructive sleep apnea (adult) (pediatric): Secondary | ICD-10-CM

## 2022-10-15 ENCOUNTER — Ambulatory Visit: Payer: Medicare HMO | Admitting: Family Medicine

## 2022-10-29 ENCOUNTER — Telehealth: Payer: Self-pay | Admitting: *Deleted

## 2022-10-29 DIAGNOSIS — I10 Essential (primary) hypertension: Secondary | ICD-10-CM

## 2022-10-29 DIAGNOSIS — G4733 Obstructive sleep apnea (adult) (pediatric): Secondary | ICD-10-CM

## 2022-10-29 NOTE — Telephone Encounter (Signed)
The patient has been notified of the result and verbalized understanding.  All questions (if any) were answered. Latrelle Dodrill, CMA 10/29/2022 3:56 PM    Will precert titration

## 2022-10-29 NOTE — Telephone Encounter (Signed)
-----   Message from Gaynelle Cage, [place] sent at 10/03/2022 10:23 AM EDT -----  ----- Message ----- From: Quintella Reichert, MD Sent: 10/02/2022   1:35 PM EDT To: [facility] Sleep Studies  Please let patient know that they have sleep apnea.  Recommend therapeutic CPAP titration for treatment of patient's sleep disordered breathing.  If unable to perform an in lab titration then initiate ResMed auto CPAP from 4 to 15cm H2O with heated humidity and mask of choice and overnight pulse ox on CPAP.

## 2022-10-30 ENCOUNTER — Encounter: Payer: Medicare HMO | Admitting: Vascular Surgery

## 2022-11-05 ENCOUNTER — Ambulatory Visit: Payer: Medicare HMO | Admitting: Internal Medicine

## 2022-11-08 ENCOUNTER — Encounter: Payer: Self-pay | Admitting: Family Medicine

## 2022-11-08 ENCOUNTER — Ambulatory Visit: Payer: Medicare HMO | Admitting: Family Medicine

## 2022-11-08 ENCOUNTER — Ambulatory Visit (INDEPENDENT_AMBULATORY_CARE_PROVIDER_SITE_OTHER): Payer: Medicare HMO | Admitting: Family Medicine

## 2022-11-08 DIAGNOSIS — K047 Periapical abscess without sinus: Secondary | ICD-10-CM

## 2022-11-08 MED ORDER — AMOXICILLIN-POT CLAVULANATE 875-125 MG PO TABS: 1.0000 | ORAL_TABLET | Freq: Two times a day (BID) | ORAL | 0 refills | Status: AC

## 2022-11-08 NOTE — Progress Notes (Unsigned)
Virtual Visit via Telephone Note   This visit type was conducted via telephone. This format is felt to be most appropriate for this patient at this time.  The patient did not have access to video technology/had technical difficulties with video requiring transitioning to audio format only (telephone).  All issues noted in this document were discussed and addressed.  No physical exam could be performed with this format.  Evaluation Performed:  Follow-up visit  Date:  11/09/2022   ID:  Rachel, George 04-12-1981, MRN [id number]  Patient Location: Home Provider Location: Clinic  Participants: Patient Location of Patient: Home Location of Provider: Clinic Consent was obtain for visit to be over via telehealth. I verified that I am speaking with the correct person using two identifiers.  PCP:  Lindell Spar, MD   Chief Complaint: Tooth abscess  History of Present Illness:    Rachel George is a 41 y.o. female with complaints of pain and swelling over the left upper molar tooth.  She has an appointment with her dentist on 11/13/2022.  She denies fever, chills currently and rates her pain 5 out of 10.  She will be following up with [facility] in [place].   The patient does not have symptoms concerning for COVID-19 infection (fever, chills, cough, or new shortness of breath).   Past Medical, Surgical, Social History, Allergies, and Medications have been Reviewed.  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   atorvastatin (LIPITOR) 20 MG tablet TAKE 1 TABLET BY MOUTH ONCE DAILY AT 6PM   bacitracin ointment Apply 1 Application topically 2 (two) times daily.   bacitracin-polymyxin b (POLYSPORIN) ophthalmic ointment PLACE OINTMENT INTO THE RIGHT EYE FOUR TIMES DAILY   Blood Glucose Monitoring Suppl (ACCU-CHEK GUIDE ME) w/Device KIT USE TESTING SUPPLIES TO TEST 4 TIMES DAILY.   chlorthalidone (HYGROTON) 25 MG tablet Take 1 tablet by mouth once daily   Continuous Blood Gluc Receiver ([place]) DEVI Please use it to check blood glucose as directed.   Continuous Blood Gluc Sensor (DEXCOM G7 SENSOR) MISC Please use it to check blood glucose as directed.   diclofenac Sodium (VOLTAREN) 1 % GEL Apply 2 g topically 3 (three) times daily as needed.   dorzolamide-timolol (  COSOPT) 22.3-6.8 MG/ML ophthalmic solution    ferrous sulfate 324 MG TBEC Take 65 mg by mouth.   fluticasone (FLONASE) 50 MCG/ACT nasal spray Place 2 sprays into both nostrils daily.   gabapentin (NEURONTIN) 300 MG capsule Take 1 capsule (300 mg total) by mouth 2 (two) times daily. For pain   gentamicin cream (GARAMYCIN) 0.1 % Apply 1 Application topically 2 (two) times daily.   insulin detemir (LEVEMIR FLEXPEN) 100 UNIT/ML FlexPen INJECT 50 UNITS UNDER THE SKIN AT BEDTIME   loperamide (IMODIUM) 2 MG capsule TAKE 1 CAPSULE BY  MOUTH EVERY 6 HOURS   LUMIGAN 0.01 % SOLN INSTILL 1 DROP INTO LEFT EYE ONCE DAILY   mupirocin ointment (BACTROBAN) 2 % Apply 1 application topically 2 (two) times daily.   NOVOLOG FLEXPEN 100 UNIT/ML FlexPen INJECT 14 TO 20 UNITS INTO THE SKIN 3 (THREE) TIMES DAILY WITH MEALS.   olopatadine (PATANOL) 0.1 % ophthalmic solution Place 1 drop into both eyes 2 times daily.   omeprazole (PRILOSEC) 40 MG capsule Take 1 capsule (40 mg total) by mouth daily.   Probiotic Product (BACID) CAPS Take 1 capsule by mouth in the morning and at bedtime.   Semaglutide, 2 MG/DOSE, 8 MG/3ML SOPN Inject 2 mg as directed once a week.   silver sulfADIAZINE (SILVADENE) 1 % cream Apply 1 Application topically daily.   spironolactone (ALDACTONE) 50 MG tablet TAKE 1 TABLET TWICE DAILY   Vitamin D, Ergocalciferol, (DRISDOL) 1.25 MG (50000 UNIT) CAPS capsule TAKE 1 CAPSULE EVERY WEEK     Allergies:   Other, Shellfish allergy, Oxycodone, and Percocet [oxycodone-acetaminophen]   ROS:   Please see the history of present illness.     All other systems reviewed and are negative.   Labs/Other Tests and Data Reviewed:    Recent Labs: 05/20/2022: BNP 6.8; Hemoglobin 11.0; Platelets 222; TSH 1.420 09/16/2022: ALT 10; BUN 21; Creatinine, Ser 1.62; Potassium 5.3; Sodium 138   Recent Lipid Panel Lab Results  Component Value Date/Time   CHOL 116 05/20/2022 08:36 AM   TRIG 128 05/20/2022 08:36 AM   HDL 43 05/20/2022 08:36 AM   CHOLHDL 2.7 05/20/2022 08:36 AM   CHOLHDL 3.9 01/26/2020 02:47 PM   LDLCALC 50 05/20/2022 08:36 AM   LDLCALC 134 (H) 01/26/2020 02:47 PM    Wt Readings from Last 3 Encounters:  09/16/22 (!) 434 lb (196.9 kg)  08/16/22 (!) 435 lb 6.4 oz (197.5 kg)  07/26/22 (!) 442 lb (200.5 kg)     Objective:    Vital Signs:  There were no vitals taken for this visit.     ASSESSMENT & PLAN:   Tooth abscess Will treat with Augmentin for anaerobic bacterial coverage for 7 days Encouraged the patient to  follow-up with her dentist as soon as possible  Time:   Today, I have spent 8 minutes reviewing the chart, including problem list, medications, and with the patient with telehealth technology discussing the above problems.   Medication Adjustments/Labs and Tests Ordered: Current medicines are reviewed at length with the patient today.  Concerns regarding medicines are outlined above.   Tests Ordered: No orders of the defined types were placed in this encounter.   Medication Changes: Meds ordered this encounter  Medications   amoxicillin-clavulanate (AUGMENTIN) 875-125 MG tablet    Sig: Take 1 tablet by mouth 2 (two) times daily for 7 days.    Dispense:  14 tablet    Refill:  0     Note: This dictation was prepared with  Dragon dictation along with smaller Company secretary. Similar sounding words can be transcribed inadequately or may not be corrected upon review. Any transcriptional errors that result from this process are unintentional.      Disposition:  Follow up  Signed, Alvira Monday, FNP  11/09/2022 10:38 AM     [place]

## 2022-11-11 ENCOUNTER — Other Ambulatory Visit: Payer: Self-pay | Admitting: Family Medicine

## 2022-11-11 ENCOUNTER — Telehealth: Payer: Self-pay | Admitting: Internal Medicine

## 2022-11-11 NOTE — Telephone Encounter (Signed)
Pt called stating she is taking the abx for the tooth abscess. States she is wanting to know if she can get something to ease the pain. States it has been hurting her all weekend.

## 2022-11-11 NOTE — Telephone Encounter (Signed)
I called the pharmacy and was informed that the antibiotic that was sent on 11/08/2022 was picked up today,11/11/22, at 12:41 PM.  Please encourage the patient to take Augmentin as prescribed with Tylenol.  As her infection subside with the antibiotic, her pain should be less severe.  I recommend that she continues taking extra strength Tylenol every 4-6 hours as needed for pain control.

## 2022-11-11 NOTE — Telephone Encounter (Signed)
Please encourage the patient to take Tylenol 650 mg q8hrs as needed for pain control

## 2022-11-12 NOTE — Telephone Encounter (Signed)
Patient advised.

## 2022-11-15 ENCOUNTER — Other Ambulatory Visit: Payer: Self-pay | Admitting: Family Medicine

## 2022-11-15 DIAGNOSIS — R195 Other fecal abnormalities: Secondary | ICD-10-CM

## 2022-11-18 ENCOUNTER — Telehealth: Payer: Self-pay | Admitting: Internal Medicine

## 2022-11-18 NOTE — Telephone Encounter (Signed)
[facility] called and said Rachel George requested a Dex Com G7 and needs authorizations for this.  Please call [phone]

## 2022-11-19 NOTE — Telephone Encounter (Signed)
Paperwork faxed 11/19/2022

## 2022-11-20 ENCOUNTER — Encounter: Payer: Medicare HMO | Admitting: Vascular Surgery

## 2022-12-04 ENCOUNTER — Encounter: Payer: Medicare HMO | Admitting: Vascular Surgery

## 2022-12-04 ENCOUNTER — Ambulatory Visit: Payer: Medicare HMO | Admitting: Family Medicine

## 2022-12-10 ENCOUNTER — Ambulatory Visit: Payer: Medicare HMO | Admitting: Family Medicine

## 2022-12-18 ENCOUNTER — Telehealth: Payer: Self-pay | Admitting: Podiatry

## 2022-12-18 NOTE — Telephone Encounter (Signed)
Referral by Dr Amalia Hailey is being cancelled by [facility]

## 2022-12-27 ENCOUNTER — Ambulatory Visit (INDEPENDENT_AMBULATORY_CARE_PROVIDER_SITE_OTHER): Payer: 59

## 2022-12-27 VITALS — Ht 67.0 in | Wt >= 6400 oz

## 2022-12-27 DIAGNOSIS — Z0000 Encounter for general adult medical examination without abnormal findings: Secondary | ICD-10-CM | POA: Diagnosis not present

## 2022-12-27 NOTE — Progress Notes (Signed)
Subjective:   Rachel George is a 42 y.o. female who presents for Medicare Annual (Subsequent) preventive examination.  Review of Systems     Objective:    There were no vitals filed for this visit. There is no height or weight on file to calculate BMI.     07/21/2022    6:18 PM 11/19/2021    9:30 AM 11/10/2020   11:10 AM 07/22/2019    5:11 PM 03/28/2019    8:23 PM 03/28/2019    2:21 PM 03/28/2019    1:51 PM  Advanced Directives  Does Patient Have a Medical Advance Directive? No No No No  No No  Would patient like information on creating a medical advance directive? No - Patient declined Yes (ED - Information included in AVS) No - Patient declined No - Patient declined   No - Guardian declined     Information is confidential and restricted. Go to Review Flowsheets to unlock data.     Current Medications (verified) Outpatient Encounter Medications as of 12/27/2022  Medication Sig   atorvastatin (LIPITOR) 20 MG tablet TAKE 1 TABLET BY MOUTH ONCE DAILY AT 6PM   bacitracin ointment Apply 1 Application topically 2 (two) times daily.   bacitracin-polymyxin b (POLYSPORIN) ophthalmic ointment PLACE OINTMENT INTO THE RIGHT EYE FOUR TIMES DAILY   Blood Glucose Monitoring Suppl (ACCU-CHEK GUIDE ME) w/Device KIT USE TESTING SUPPLIES TO TEST 4 TIMES DAILY.   chlorthalidone (HYGROTON) 25 MG tablet Take 1 tablet by mouth once daily   Continuous Blood Gluc Receiver (DEXCOM G7 RECEIVER) DEVI Please use it to check blood glucose as directed.   Continuous Blood Gluc Sensor (DEXCOM G7 SENSOR) MISC Please use it to check blood glucose as directed.   diclofenac Sodium (VOLTAREN) 1 % GEL Apply 2 g topically 3 (three) times daily as needed.   dorzolamide-timolol (COSOPT) 22.3-6.8 MG/ML ophthalmic solution    ferrous sulfate 324 MG TBEC Take 65 mg by mouth.   fluticasone (FLONASE) 50 MCG/ACT nasal spray Place 2 sprays into both nostrils daily.   gabapentin (NEURONTIN) 300 MG capsule Take 1  capsule (300 mg total) by mouth 2 (two) times daily. For pain   gentamicin cream (GARAMYCIN) 0.1 % Apply 1 Application topically 2 (two) times daily.   insulin detemir (LEVEMIR FLEXPEN) 100 UNIT/ML FlexPen INJECT 50 UNITS UNDER THE SKIN AT BEDTIME   lisinopril-hydrochlorothiazide (ZESTORETIC) 20-12.5 MG tablet Take 1 tablet by mouth daily.   loperamide (IMODIUM) 2 MG capsule TAKE 1 CAPSULE BY MOUTH EVERY 6 HOURS   LUMIGAN 0.01 % SOLN INSTILL 1 DROP INTO LEFT EYE ONCE DAILY   mupirocin ointment (BACTROBAN) 2 % Apply 1 application topically 2 (two) times daily.   NOVOLOG FLEXPEN 100 UNIT/ML FlexPen INJECT 14 TO 20 UNITS INTO THE SKIN 3 (THREE) TIMES DAILY WITH MEALS.   olopatadine (PATANOL) 0.1 % ophthalmic solution Place 1 drop into both eyes 2 times daily.   omeprazole (PRILOSEC) 40 MG capsule Take 1 capsule (40 mg total) by mouth daily.   Probiotic Product (BACID) CAPS Take 1 capsule by mouth in the morning and at bedtime.   Semaglutide, 2 MG/DOSE, 8 MG/3ML SOPN Inject 2 mg as directed once a week.   silver sulfADIAZINE (SILVADENE) 1 % cream Apply 1 Application topically daily.   spironolactone (ALDACTONE) 50 MG tablet TAKE 1 TABLET TWICE DAILY   Vitamin D, Ergocalciferol, (DRISDOL) 1.25 MG (50000 UNIT) CAPS capsule TAKE 1 CAPSULE EVERY WEEK   No facility-administered encounter medications on file as  of 12/27/2022.    Allergies (verified) Other, Shellfish allergy, Oxycodone, and Percocet [oxycodone-acetaminophen]   History: Past Medical History:  Diagnosis Date   Anemia    Cataract    Cellulitis of toe of right foot 10/07/2016   Depression    Diabetes mellitus without complication [facility])    dx age 59   Diabetic foot ulcer (HCC) 10/15/2016   Fatty liver disease, nonalcoholic    Glaucoma    Hyperlipidemia    Hypertension    Macular degeneration    Morbid obesity with BMI of 50.0-59.9, adult (HCC) 05/01/2017   Neuropathy    Obesity, Class III, BMI 40-49.9 (morbid obesity) (HCC)  05/01/2017   Osteolysis, right ankle and foot 05/01/2017   Osteomyelitis (HCC) 12/13/2016   Partial blindness    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   Suicidal ideation 11/04/2017   Past Surgical History:  Procedure Laterality Date   AMPUTATION TOE Right 12/16/2016   Procedure: RIGHT FIRST TOE AMPUTATION;  Surgeon: Ancil Linsey, MD;  Location: [facility];  Service: General;  Laterality: Right;  Right first toe amputation for osteomyelitis   eye lid transplant     RETINAL LASER PROCEDURE     Family History  Problem Relation Age of Onset   Diabetes Mother    Thyroid disease Mother    Diabetes Father    Social History   Socioeconomic History   Marital status: Legally Separated    Spouse name: Neveen Daponte   Number of children: 0   Years of education: Not on file   Highest education level: High school graduate  Occupational History   Occupation: CNA personal care giver  Tobacco Use   Smoking status: Former    Packs/day: 0.25    Types: Cigarettes    Quit date: 12/11/2016    Years since quitting: 6.0   Smokeless tobacco: Never  Vaping Use   Vaping Use: Never used  Substance and Sexual Activity   Alcohol use: No   Drug use: No   Sexual activity: Yes    Partners: Male    Birth control/protection: None  Other Topics Concern   Not on file  Social History Narrative   Separated from husband-married for 7 years      Lives alone right now, in a disability apartment.   Right handed      Enjoys walking       Diet: eat one meal a day- veggies, meats, low carb    Caffeine: tea-4 cups daily    Water: 4-6 bottles of water      Wears seat belt   Smoke detectors    Does not use phone while driving   Social Determinants of Health   Financial Resource Strain: Medium Risk (11/19/2021)   Overall Financial Resource Strain (CARDIA)    Difficulty of Paying Living Expenses: Somewhat hard  Food Insecurity: Food Insecurity Present (08/16/2022)   Hunger Vital Sign    Worried About  Running Out of Food in the Last Year: Sometimes true    Ran Out of Food in the Last Year: Sometimes true  Transportation Needs: No Transportation Needs (11/19/2021)   PRAPARE - Administrator, Civil Service (Medical): No    Lack of Transportation (Non-Medical): No  Physical Activity: Sufficiently Active (11/19/2021)   Exercise Vital Sign    Days of Exercise per Week: 4 days    Minutes of Exercise per Session: 60 min  Stress: No Stress Concern Present (11/10/2020)   [place]  [employer] - Occupational Stress Questionnaire    Feeling of Stress : Only a little  Social Connections: Socially Isolated (11/19/2021)   Social Connection and Isolation Panel [NHANES]    Frequency of Communication with Friends and Family: Three times a week    Frequency of Social Gatherings with Friends and Family: Twice a week    Attends Religious Services: Never    Marine scientist or Organizations: No    Attends Archivist Meetings: Never    Marital Status: Never married    Tobacco Counseling Counseling given: Not Answered   Clinical Intake:  Diabetic? Yes   Activities of Daily Living     No data to display           Patient Care Team: Lindell Spar, MD as PCP - General (Internal Medicine) Nahser, Wonda Cheng, MD as PCP - Cardiology (Cardiology)  Indicate any recent Medical Services you may have received from other than [facility] providers in the past year (date may be approximate).     Assessment:   This is a routine wellness examination for Keli.  Hearing/Vision screen No results found.  Dietary issues and exercise activities discussed:     Goals Addressed   None   Depression Screen    11/08/2022    1:09 PM 09/16/2022    2:39 PM 08/26/2022   10:03 AM 07/26/2022    9:12 AM 06/05/2022    3:15 PM 05/09/2022    3:52 PM 04/23/2022    1:37 PM  PHQ 2/9 Scores  PHQ - 2 Score 0 0 0 0 0 0 0  PHQ- 9 Score 0          Fall Risk    11/08/2022     1:08 PM 09/16/2022    2:39 PM 08/26/2022   10:03 AM 07/26/2022    9:12 AM 06/05/2022    3:15 PM  Fall Risk   Falls in the past year? 0 0 0 0 0  Number falls in past yr: 0 0 0 0 0  Injury with Fall? 0 0 0 0 0  Risk for fall due to : No Fall Risks No Fall Risks No Fall Risks No Fall Risks No Fall Risks  Follow up Falls evaluation completed Falls evaluation completed Falls evaluation completed Falls evaluation completed Falls evaluation completed    FALL RISK PREVENTION PERTAINING TO THE HOME:  Any stairs in or around the home? No  If so, are there any without handrails? No  Home free of loose throw rugs in walkways, pet beds, electrical cords, etc? Yes  Adequate lighting in your home to reduce risk of falls? Yes   ASSISTIVE DEVICES UTILIZED TO PREVENT FALLS:  Life alert? No  Use of a cane, walker or w/c? No  Grab bars in the bathroom? Yes  Shower chair or bench in shower? Yes  Elevated toilet seat or a handicapped toilet? No     Cognitive Function:        11/19/2021    9:34 AM 11/10/2020   11:11 AM  6CIT Screen  What Year? 0 points 0 points  What month? 0 points 0 points  What time? 0 points 0 points  Count back from 20 0 points 0 points  Months in reverse 0 points 0 points  Repeat phrase 0 points 0 points  Total Score 0 points 0 points    Immunizations Immunization History  Administered Date(s) Administered   Influenza,inj,Quad PF,6+ Mos 12/14/2016, 09/11/2017, 12/08/2018,  10/03/2020, 08/14/2021, 09/16/2022   Influenza-Unspecified 09/01/2017   Moderna SARS-COV2 Booster Vaccination 03/10/2021   Moderna Sars-Covid-2 Vaccination 04/04/2020, 05/02/2020   Tdap 08/14/2021    TDAP status: Up to date  Flu Vaccine status: Up to date  Pneumococcal vaccine status: Declined,  Education has been provided regarding the importance of this vaccine but patient still declined. Advised may receive this vaccine at local pharmacy or Health Dept. Aware to provide a copy of the  vaccination record if obtained from local pharmacy or Health Dept. Verbalized acceptance and understanding.   Covid-19 vaccine status: Completed vaccines  Qualifies for Shingles Vaccine? No   Zostavax completed No   Shingrix Completed?: No.    Education has been provided regarding the importance of this vaccine. Patient has been advised to call insurance company to determine out of pocket expense if they have not yet received this vaccine. Advised may also receive vaccine at local pharmacy or Health Dept. Verbalized acceptance and understanding.  Screening Tests Health Maintenance  Topic Date Due   PAP SMEAR-Modifier  Never done   COVID-19 Vaccine (4 - 2023-24 season) 08/02/2022   OPHTHALMOLOGY EXAM  01/18/2023   HEMOGLOBIN A1C  03/18/2023   Diabetic kidney evaluation - eGFR measurement  09/17/2023   Diabetic kidney evaluation - Urine ACR  09/17/2023   FOOT EXAM  09/17/2023   Medicare Annual Wellness (AWV)  12/28/2023   DTaP/Tdap/Td (2 - Td or Tdap) 08/15/2031   INFLUENZA VACCINE  Completed   Hepatitis C Screening  Completed   HIV Screening  Completed   HPV VACCINES  Aged Out    Health Maintenance  Health Maintenance Due  Topic Date Due   PAP SMEAR-Modifier  Never done   COVID-19 Vaccine (4 - 2023-24 season) 08/02/2022    Colorectal Cancer screening: not yet required.  Mammogram statues: not yet required due to under recommended age.   Bone Density Status: not yet required due to under recommended age.    Lung Cancer Screening: (Low Dose CT Chest recommended if Age 59-80 years, 30 pack-year currently smoking OR have quit w/in 15years.) does not qualify.   Lung Cancer Screening Referral: NA  Additional Screening:  Hepatitis C Screening: does not qualify; Completed   Vision Screening: Recommended annual ophthalmology exams for early detection of glaucoma and other disorders of the eye. Is the patient up to date with their annual eye exam?  Yes  Who is the provider or  what is the name of the office in which the patient attends annual eye exams? [facility] If pt is not established with a provider, would they like to be referred to a provider to establish care? No .   Dental Screening: Recommended annual dental exams for proper oral hygiene  Community Resource Referral / Chronic Care Management: CRR required this visit?  No   CCM required this visit?  No      Plan:     I have personally reviewed and noted the following in the patient's chart:   Medical and social history Use of alcohol, tobacco or illicit drugs  Current medications and supplements including opioid prescriptions. Patient is not currently taking opioid prescriptions. Functional ability and status Nutritional status Physical activity Advanced directives List of other physicians Hospitalizations, surgeries, and ER visits in previous 12 months Vitals Screenings to include cognitive, depression, and falls Referrals and appointments  In addition, I have reviewed and discussed with patient certain preventive protocols, quality metrics, and best practice recommendations. A written personalized care  plan for preventive services as well as general preventive health recommendations were provided to patient.     Smitty Knudsen, CMA   12/27/2022

## 2022-12-30 ENCOUNTER — Ambulatory Visit: Payer: Medicare HMO | Admitting: Family Medicine

## 2023-01-07 ENCOUNTER — Other Ambulatory Visit: Payer: Self-pay | Admitting: Family Medicine

## 2023-01-07 ENCOUNTER — Other Ambulatory Visit: Payer: Self-pay | Admitting: Internal Medicine

## 2023-01-07 DIAGNOSIS — R195 Other fecal abnormalities: Secondary | ICD-10-CM

## 2023-01-07 DIAGNOSIS — I10 Essential (primary) hypertension: Secondary | ICD-10-CM

## 2023-01-07 DIAGNOSIS — E785 Hyperlipidemia, unspecified: Secondary | ICD-10-CM

## 2023-01-10 ENCOUNTER — Ambulatory Visit (INDEPENDENT_AMBULATORY_CARE_PROVIDER_SITE_OTHER): Payer: 59 | Admitting: Family Medicine

## 2023-01-10 VITALS — BP 138/88 | HR 88 | Ht 67.0 in | Wt >= 6400 oz

## 2023-01-10 DIAGNOSIS — J069 Acute upper respiratory infection, unspecified: Secondary | ICD-10-CM | POA: Insufficient documentation

## 2023-01-10 MED ORDER — AZELASTINE-FLUTICASONE 137-50 MCG/ACT NA SUSP: 1.0000 | Freq: Two times a day (BID) | NASAL | 1 refills | Status: DC

## 2023-01-10 MED ORDER — ALBUTEROL SULFATE HFA 108 (90 BASE) MCG/ACT IN AERS: 2.0000 | INHALATION_SPRAY | Freq: Four times a day (QID) | RESPIRATORY_TRACT | 2 refills | Status: AC | PRN

## 2023-01-10 MED ORDER — GUAIFENESIN 100 MG/5ML PO LIQD: 5.0000 mL | ORAL | 0 refills | Status: DC | PRN

## 2023-01-10 MED ORDER — LEVOCETIRIZINE DIHYDROCHLORIDE 5 MG PO TABS: 5.0000 mg | ORAL_TABLET | Freq: Every evening | ORAL | 1 refills | Status: AC

## 2023-01-10 MED ORDER — AMOXICILLIN-POT CLAVULANATE 875-125 MG PO TABS: 1.0000 | ORAL_TABLET | Freq: Two times a day (BID) | ORAL | 0 refills | Status: DC

## 2023-01-10 NOTE — Patient Instructions (Signed)
It was a pleasure meeting with you today. Please take your medications as prescribed Please follow up for pap smear in 8 weeks

## 2023-01-10 NOTE — Assessment & Plan Note (Signed)
Prescribed amoxicillin-clavulanate 875-134m,  Albuterol inhaler, azelastine-fluicasone 137-542m, robitussuin,leveocetirizine 5 mg  Explained to patient symptomatic treatment, rest, increase oral fluid intake.  Follow-up for worsening or persistent symptoms. Patient verbalizes understanding regarding plan of care and all questions answered

## 2023-01-10 NOTE — Progress Notes (Signed)
Patient Office Visit   Subjective   Patient ID: Rachel George, female    DOB: 07-Apr-1981  Age: 42 y.o. MRN: [id number]  CC:  Chief Complaint  Patient presents with   Diabetes    Patient states she has no issues with her blood sugar, is taking insulin and checking sugar.    Hypertension    Patient complains of blood pressure running high. Says she checks it at home sometimes, and does not feel well when it is high.    Cough    Patient states she started with a cough and wheezing today. Says her apartment was sprayed for pest control a couple days ago.     HPI Rachel George presents to clinic for cough and shortness of breath . She  has a past medical history of Anemia, Cataract, Cellulitis of toe of right foot (10/07/2016), Depression, Diabetes mellitus without complication ([place]), Diabetic foot ulcer ([place]) (10/15/2016), Fatty liver disease, nonalcoholic, Glaucoma, Hyperlipidemia, Hypertension, Macular degeneration, Morbid obesity with BMI of 50.0-59.9, adult ([place]) (05/01/2017), Neuropathy, Obesity, Class III, BMI 40-49.9 (morbid obesity) ([place]) (05/01/2017), Osteolysis, right ankle and foot (05/01/2017), Osteomyelitis ([place]) (12/13/2016), Partial blindness, S/P amputation (02/06/2017), and Suicidal ideation (11/04/2017).  Patient complains cough, shortness of breath, facial pain nausea,malaise, green sputum production, and wheezing. Symptoms began a few days ago ago and are gradually worsening since that time. Patient denies chest pain. Treatment patient tried include none. Past pulmonary history is significant for frequent episodes of bronchitis.  Cough This is a new problem. The current episode started in the past 7 days. The problem has been gradually worsening. The problem occurs constantly. The cough is Productive of purulent sputum. Associated symptoms include chills, a fever, headaches, myalgias, nasal congestion, rhinorrhea, a sore throat, shortness of breath and wheezing.  Pertinent negatives include no ear pain or rash. Nothing aggravates the symptoms. She has tried nothing for the symptoms. Her past medical history is significant for bronchitis. There is no history of asthma or pneumonia.      Outpatient Encounter Medications as of 01/10/2023  Medication Sig   albuterol (VENTOLIN HFA) 108 (90 Base) MCG/ACT inhaler Inhale 2 puffs into the lungs every 6 (six) hours as needed for wheezing or shortness of breath.   amoxicillin-clavulanate (AUGMENTIN) 875-125 MG tablet Take 1 tablet by mouth 2 (two) times daily.   atorvastatin (LIPITOR) 20 MG tablet TAKE 1 TABLET BY MOUTH ONCE DAILY 6 IN THE EVENING   Azelastine-Fluticasone 137-50 MCG/ACT SUSP Place 1 spray into the nose every 12 (twelve) hours.   bacitracin ointment Apply 1 Application topically 2 (two) times daily.   bacitracin-polymyxin b (POLYSPORIN) ophthalmic ointment PLACE OINTMENT INTO THE RIGHT EYE FOUR TIMES DAILY   Blood Glucose Monitoring Suppl (ACCU-CHEK GUIDE ME) w/Device KIT USE TESTING SUPPLIES TO TEST 4 TIMES DAILY.   chlorthalidone (HYGROTON) 25 MG tablet Take 1 tablet by mouth once daily   Continuous Blood Gluc Receiver ([place]) DEVI Please use it to check blood glucose as directed.   Continuous Blood Gluc Sensor (DEXCOM G7 SENSOR) MISC Please use it to check blood glucose as directed.   diclofenac Sodium (VOLTAREN) 1 % GEL Apply 2 g topically 3 (three) times daily as needed.   dorzolamide-timolol (COSOPT) 22.3-6.8 MG/ML ophthalmic solution    ferrous sulfate 324 MG TBEC Take 65 mg by mouth.   gabapentin (NEURONTIN) 300 MG capsule Take 1 capsule (300 mg total) by mouth 2 (two) times daily. For pain   gentamicin cream (GARAMYCIN)  0.1 % Apply 1 Application topically 2 (two) times daily.   guaiFENesin (ROBITUSSIN) 100 MG/5ML liquid Take 5 mLs by mouth every 4 (four) hours as needed for cough or to loosen phlegm.   insulin detemir (LEVEMIR FLEXPEN) 100 UNIT/ML FlexPen INJECT 50 UNITS UNDER  THE SKIN AT BEDTIME   levocetirizine (XYZAL) 5 MG tablet Take 1 tablet (5 mg total) by mouth every evening.   lisinopril-hydrochlorothiazide (ZESTORETIC) 20-12.5 MG tablet Take 1 tablet by mouth once daily   loperamide (IMODIUM) 2 MG capsule TAKE 1 CAPSULE BY MOUTH EVERY 6 HOURS   LUMIGAN 0.01 % SOLN INSTILL 1 DROP INTO LEFT EYE ONCE DAILY   mupirocin ointment (BACTROBAN) 2 % Apply 1 application topically 2 (two) times daily.   NOVOLOG FLEXPEN 100 UNIT/ML FlexPen INJECT 14 TO 20 UNITS INTO THE SKIN 3 (THREE) TIMES DAILY WITH MEALS.   olopatadine (PATANOL) 0.1 % ophthalmic solution Place 1 drop into both eyes 2 times daily.   omeprazole (PRILOSEC) 40 MG capsule Take 1 capsule (40 mg total) by mouth daily.   Probiotic Product (BACID) CAPS Take 1 capsule by mouth in the morning and at bedtime.   Semaglutide, 2 MG/DOSE, 8 MG/3ML SOPN Inject 2 mg as directed once a week.   silver sulfADIAZINE (SILVADENE) 1 % cream Apply 1 Application topically daily.   spironolactone (ALDACTONE) 50 MG tablet TAKE 1 TABLET TWICE DAILY   Vitamin D, Ergocalciferol, (DRISDOL) 1.25 MG (50000 UNIT) CAPS capsule TAKE 1 CAPSULE EVERY WEEK   [DISCONTINUED] fluticasone (FLONASE) 50 MCG/ACT nasal spray Place 2 sprays into both nostrils daily.   No facility-administered encounter medications on file as of 01/10/2023.    Past Surgical History:  Procedure Laterality Date   AMPUTATION TOE Right 12/16/2016   Procedure: RIGHT FIRST TOE AMPUTATION;  Surgeon: Vickie Epley, MD;  Location: [facility];  Service: General;  Laterality: Right;  Right first toe amputation for osteomyelitis   eye lid transplant     RETINAL LASER PROCEDURE      Review of Systems  Constitutional:  Positive for chills, fever and malaise/fatigue.  HENT:  Positive for congestion, rhinorrhea, sinus pain and sore throat. Negative for ear discharge and ear pain.   Respiratory:  Positive for cough, sputum production, shortness of breath and wheezing.    Gastrointestinal:  Positive for nausea. Negative for abdominal pain and vomiting.  Musculoskeletal:  Positive for myalgias.  Skin:  Negative for itching and rash.  Neurological:  Positive for headaches.      Objective    BP 138/88   Pulse 88   Ht 5' 7"$  (1.702 m)   Wt (!) 438 lb (198.7 kg)   BMI 68.60 kg/m   Physical Exam Constitutional:      Appearance: She is obese.  HENT:     Right Ear: Tympanic membrane normal.     Left Ear: Tympanic membrane normal.     Nose: Congestion and rhinorrhea present.     Mouth/Throat:     Mouth: Mucous membranes are moist.     Pharynx: No oropharyngeal exudate or posterior oropharyngeal erythema.  Cardiovascular:     Rate and Rhythm: Normal rate.     Pulses: Normal pulses.  Pulmonary:     Effort: Pulmonary effort is normal. No respiratory distress.     Breath sounds: Wheezing and rhonchi present.  Musculoskeletal:     Cervical back: Neck supple.  Skin:    General: Skin is warm and dry.     Capillary Refill: Capillary refill  takes less than 2 seconds.  Neurological:     Mental Status: She is alert and oriented to person, place, and time.  Psychiatric:        Mood and Affect: Mood normal.       Assessment & Plan:  Upper respiratory tract infection, unspecified type Assessment & Plan: Prescribed amoxicillin-clavulanate 875-185m,  Albuterol inhaler, azelastine-fluicasone 137-523m, robitussuin,leveocetirizine 5 mg  Explained to patient symptomatic treatment, rest, increase oral fluid intake.  Follow-up for worsening or persistent symptoms. Patient verbalizes understanding regarding plan of care and all questions answered    Orders: -     Albuterol Sulfate HFA; Inhale 2 puffs into the lungs every 6 (six) hours as needed for wheezing or shortness of breath.  Dispense: 8 g; Refill: 2 -     Levocetirizine Dihydrochloride; Take 1 tablet (5 mg total) by mouth every evening.  Dispense: 15 tablet; Refill: 1 -     Azelastine-Fluticasone;  Place 1 spray into the nose every 12 (twelve) hours.  Dispense: 23 g; Refill: 1 -     guaiFENesin; Take 5 mLs by mouth every 4 (four) hours as needed for cough or to loosen phlegm.  Dispense: 120 mL; Refill: 0 -     Amoxicillin-Pot Clavulanate; Take 1 tablet by mouth 2 (two) times daily.  Dispense: 14 tablet; Refill: 0    Return in about 2 months (around 03/11/2023) for Pap Smear.   IlRenard HamperrRia CommentFNP

## 2023-01-13 DIAGNOSIS — Z0279 Encounter for issue of other medical certificate: Secondary | ICD-10-CM

## 2023-01-15 ENCOUNTER — Ambulatory Visit: Payer: 59 | Admitting: Internal Medicine

## 2023-01-28 DIAGNOSIS — E113523 Type 2 diabetes mellitus with proliferative diabetic retinopathy with traction retinal detachment involving the macula, bilateral: Secondary | ICD-10-CM | POA: Diagnosis not present

## 2023-01-28 DIAGNOSIS — H3581 Retinal edema: Secondary | ICD-10-CM | POA: Diagnosis not present

## 2023-01-28 DIAGNOSIS — H4051X2 Glaucoma secondary to other eye disorders, right eye, moderate stage: Secondary | ICD-10-CM | POA: Diagnosis not present

## 2023-01-28 DIAGNOSIS — Z794 Long term (current) use of insulin: Secondary | ICD-10-CM | POA: Diagnosis not present

## 2023-01-28 LAB — HM DIABETES EYE EXAM

## 2023-01-29 ENCOUNTER — Ambulatory Visit: Payer: 59 | Admitting: Internal Medicine

## 2023-02-11 ENCOUNTER — Ambulatory Visit: Payer: Medicare HMO | Admitting: Cardiovascular Disease

## 2023-02-12 ENCOUNTER — Ambulatory Visit: Payer: Self-pay | Admitting: Physician Assistant

## 2023-02-12 NOTE — Progress Notes (Deleted)
Office Visit    Patient Name: Rachel George Date of Encounter: 02/12/2023  PCP:  Lindell Spar, MD   [facility]  Cardiologist:  Mertie Moores, MD  Advanced Practice Provider:  No care team member to display Electrophysiologist:  None   HPI    Rachel George is a 42 y.o. female with a history of morbid obesity, CHF, HLD, HTN presents today for follow-up appointment.  We were asked to evaluate her for racing heart rate mostly during the night.  She has a history of diabetic retinopathy and is legally blind.  Wakes up with racing heart and chest pain after she has been asleep for several hours.  She was told that she likely had OSA and underwent a sleep study in 2019.  She never got the results and never got a CPAP.  She also has a history of leg edema and severe DOE, chest pain.  She follows a low sodium diet and eats baked chicken and fish.  She was trying to lose weight.  She is unable to work and stopped working in 2018 due to poor eyesight.  Gained 100 pounds since that time.  Has stopped smoking.  She was going through some difficulty with the food stamp program and gets [employer] and mac & cheese for free.  She is able to get her medications at no cost.  She presents today for follow-up visit.  Today, she***  Past Medical History    Past Medical History:  Diagnosis Date   Anemia    Cataract    Cellulitis of toe of right foot 10/07/2016   Depression    Diabetes mellitus without complication [facility])    dx age 46   Diabetic foot ulcer ([place]) 10/15/2016   Fatty liver disease, nonalcoholic    Glaucoma    Hyperlipidemia    Hypertension    Macular degeneration    Morbid obesity with BMI of 50.0-59.9, adult ([place]) 05/01/2017   Neuropathy    Obesity, Class III, BMI 40-49.9 (morbid obesity) ([place]) 05/01/2017   Osteolysis, right ankle and foot 05/01/2017   Osteomyelitis ([place]) 12/13/2016   Partial blindness    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    Suicidal ideation 11/04/2017   Past Surgical History:  Procedure Laterality Date   AMPUTATION TOE Right 12/16/2016   Procedure: RIGHT FIRST TOE AMPUTATION;  Surgeon: Vickie Epley, MD;  Location: [facility];  Service: General;  Laterality: Right;  Right first toe amputation for osteomyelitis   eye lid transplant     RETINAL LASER PROCEDURE      Allergies  Allergies  Allergen Reactions   Other Anaphylaxis   Shellfish Allergy Anaphylaxis, Shortness Of Breath and Swelling    Facial Swelling   Oxycodone Nausea And Vomiting   Percocet [Oxycodone-Acetaminophen] Itching and Nausea And Vomiting    EKGs/Labs/Other Studies Reviewed:   The following studies were reviewed today:  Echocardiogram 08/29/2022 IMPRESSIONS     1. Left ventricular ejection fraction, by estimation, is 60 to 65%. The  left ventricle has normal function. The left ventricle has no regional  wall motion abnormalities. Left ventricular diastolic parameters were  normal.   2. Right ventricular systolic function is normal. The right ventricular  size is normal.   3. The mitral valve is normal in structure. No evidence of mitral valve  regurgitation. No evidence of mitral stenosis.   4. The aortic valve has an indeterminant number of cusps. Aortic valve  regurgitation is not visualized. No aortic stenosis is present.   5. The inferior vena cava is normal in size with greater than 50%  respiratory variability, suggesting right atrial pressure of 3 mmHg.   Comparison(s): No prior Echocardiogram.   FINDINGS   Left Ventricle: Left ventricular ejection fraction, by estimation, is 60  to 65%. The left ventricle has normal function. The left ventricle has no  regional wall motion abnormalities. The left ventricular internal cavity  size was normal in size. There is   no left ventricular hypertrophy. Left ventricular diastolic parameters  were normal.   Right Ventricle: The right ventricular size is normal. Right  ventricular  systolic function is normal.   Left Atrium: Left atrial size was normal in size.   Right Atrium: Right atrial size was normal in size.   Pericardium: There is no evidence of pericardial effusion.   Mitral Valve: The mitral valve is normal in structure. No evidence of  mitral valve regurgitation. No evidence of mitral valve stenosis.   Tricuspid Valve: The tricuspid valve is normal in structure. Tricuspid  valve regurgitation is not demonstrated. No evidence of tricuspid  stenosis.   Aortic Valve: The aortic valve has an indeterminant number of cusps.  Aortic valve regurgitation is not visualized. No aortic stenosis is  present.   Pulmonic Valve: The pulmonic valve was normal in structure. Pulmonic valve  regurgitation is not visualized. No evidence of pulmonic stenosis.   Aorta: The aortic root is normal in size and structure.   Venous: The inferior vena cava is normal in size with greater than 50%  respiratory variability, suggesting right atrial pressure of 3 mmHg.   IAS/Shunts: No atrial level shunt detected by color flow Doppler.   EKG:  EKG is *** ordered today.  The ekg ordered today demonstrates ***  Recent Labs: 05/20/2022: BNP 6.8; Hemoglobin 11.0; Platelets 222; TSH 1.420 09/16/2022: ALT 10; BUN 21; Creatinine, Ser 1.62; Potassium 5.3; Sodium 138  Recent Lipid Panel    Component Value Date/Time   CHOL 116 05/20/2022 0836   TRIG 128 05/20/2022 0836   HDL 43 05/20/2022 0836   CHOLHDL 2.7 05/20/2022 0836   CHOLHDL 3.9 01/26/2020 1447   VLDL 34 12/07/2018 0639   LDLCALC 50 05/20/2022 0836   LDLCALC 134 (H) 01/26/2020 1447    Risk Assessment/Calculations:  {Does this patient have ATRIAL FIBRILLATION?:[id number]}  Home Medications   No outpatient medications have been marked as taking for the 02/12/23 encounter (Appointment) with Elgie Collard, PA-C.     Review of Systems   ***   All other systems reviewed and are otherwise negative except as  noted above.  Physical Exam    VS:  There were no vitals taken for this visit. , BMI There is no height or weight on file to calculate BMI.  Wt Readings from Last 3 Encounters:  01/10/23 (!) 438 lb (198.7 kg)  12/27/22 (!) 434 lb (196.9 kg)  09/16/22 (!) 434 lb (196.9 kg)     GEN: Well nourished, well developed, in no acute distress. HEENT: normal. Neck: Supple, no JVD, carotid bruits, or masses. Cardiac: ***RRR, no murmurs, rubs, or gallops. No clubbing, cyanosis, edema.  ***Radials/PT 2+ and equal bilaterally.  Respiratory:  ***Respirations regular and unlabored, clear to auscultation bilaterally. GI: Soft, nontender, nondistended. MS: No deformity or atrophy. Skin: Warm and dry, no rash. Neuro:  Strength and sensation are intact. Psych: Normal affect.  Assessment & Plan    CHF HTN Obstructive sleep apnea  Morbid obesity Social issues  No BP recorded.  {Refresh Note OR Click here to enter BP  :1}***      Disposition: Follow up {follow up:15908} with Mertie Moores, MD or APP.  Signed, Elgie Collard, PA-C 02/12/2023, 9:46 AM [place]

## 2023-02-13 ENCOUNTER — Ambulatory Visit (INDEPENDENT_AMBULATORY_CARE_PROVIDER_SITE_OTHER): Payer: 59 | Admitting: Internal Medicine

## 2023-02-13 ENCOUNTER — Encounter: Payer: Self-pay | Admitting: Internal Medicine

## 2023-02-13 VITALS — BP 156/74 | HR 105 | Ht 67.0 in | Wt >= 6400 oz

## 2023-02-13 DIAGNOSIS — Z794 Long term (current) use of insulin: Secondary | ICD-10-CM | POA: Diagnosis not present

## 2023-02-13 DIAGNOSIS — R079 Chest pain, unspecified: Secondary | ICD-10-CM

## 2023-02-13 DIAGNOSIS — E1142 Type 2 diabetes mellitus with diabetic polyneuropathy: Secondary | ICD-10-CM

## 2023-02-13 DIAGNOSIS — I10 Essential (primary) hypertension: Secondary | ICD-10-CM | POA: Diagnosis not present

## 2023-02-13 DIAGNOSIS — E113523 Type 2 diabetes mellitus with proliferative diabetic retinopathy with traction retinal detachment involving the macula, bilateral: Secondary | ICD-10-CM

## 2023-02-13 DIAGNOSIS — E1165 Type 2 diabetes mellitus with hyperglycemia: Secondary | ICD-10-CM

## 2023-02-13 DIAGNOSIS — I5032 Chronic diastolic (congestive) heart failure: Secondary | ICD-10-CM

## 2023-02-13 DIAGNOSIS — E782 Mixed hyperlipidemia: Secondary | ICD-10-CM

## 2023-02-13 MED ORDER — DEXCOM G7 SENSOR MISC: 5 refills | Status: DC

## 2023-02-13 MED ORDER — DEXCOM G7 RECEIVER DEVI: 0 refills | Status: AC

## 2023-02-13 MED ORDER — ISOSORBIDE MONONITRATE ER 30 MG PO TB24: 15.0000 mg | ORAL_TABLET | Freq: Every day | ORAL | 1 refills | Status: DC

## 2023-02-13 NOTE — Assessment & Plan Note (Addendum)
Lab Results  Component Value Date   HGBA1C 8.3 (H) 02/13/2023   Uncontrolled, but improving On Levemir 60 U QD and Novolog sliding scale States it stays around 150-200, with only few readings above 200 Had added Ozempic, increased dose to 2 mg qw  Tried to send Dexcom G7, but was not able to get it - advised to find preferred DME from insurance provider Saw Dr Dorris Fetch in the past, had referral sent again  Advised to follow diabetic diet On ACE and statin F/u CMP and lipid panel Diabetic eye exam: Advised to follow up with Ophthalmology for diabetic eye exam

## 2023-02-13 NOTE — Patient Instructions (Addendum)
Please start taking Vitamin D 5000 IU once daily.  Please continue to take medications as prescribed.  Please continue to follow low carb diet and ambulate as tolerated.

## 2023-02-13 NOTE — Assessment & Plan Note (Signed)
Followed by retina clinic/Dr. Manuella Ghazi

## 2023-02-13 NOTE — Assessment & Plan Note (Signed)
On gabapentin, needs to be compliant

## 2023-02-13 NOTE — Assessment & Plan Note (Addendum)
Contributing to uncontrolled DM and HTN Likely has OSA On Ozempic for type 2 DM Emphasized to comply with low carb diet

## 2023-02-14 NOTE — Assessment & Plan Note (Signed)
BP Readings from Last 1 Encounters:  02/13/23 (!) 156/74   Well-controlled with Lisinopril-HCTZ, Spironolactone and Chlorthalidone Added Imdur, would to help with chest pain Could be a component of pain and OSA, had referred for sleep study, needs CPAP device Counseled for compliance with the medications Advised DASH diet and moderate exercise/walking, at least 150 mins/week

## 2023-02-14 NOTE — Assessment & Plan Note (Signed)
Has resting chest pain, due to her history of type II DM, HTN and morbid obesity, at risk of CAD EKG: Sinus rhythm.  No signs of active ischemia. Referred to cardiology - needs to schedule appt

## 2023-02-14 NOTE — Assessment & Plan Note (Signed)
Her leg swelling and orthopnea likely due to CHF Referred to cardiology for echo -also has episodes of chest pain, due to her history of type II DM and HTN with morbid obesity, at risk for CAD, needs to follow up  On lisinopril-HCTZ and Spironolactone On Chlorthalidone for uncontrolled HTN Plan to add Jardiance, but due to her current BMI and functional status, risk of vulvovaginitis

## 2023-02-14 NOTE — Assessment & Plan Note (Signed)
On Lipitor 

## 2023-02-14 NOTE — Progress Notes (Signed)
Established Patient Office Visit  Subjective:  Patient ID: Rachel George, female    DOB: 1981/11/21  Age: 42 y.o. MRN: [id number]  CC:  Chief Complaint  Patient presents with   Diabetes    Follow up for diabetes. Patient states she feels like she is full of fluid and feels like her blood pressure is high lately. She complaining of shortness of breath    HPI Rachel George is a 42 y.o. female with past medical history of HTN, uncontrolled DM with polyneuropathy and retinopathy, HLD, GERD and morbid obesity who presents for f/u of her chronic medical conditions.  HTN: Her blood pressure was slightly elevated in the office today.  She has been taking lisinopril HCTZ and spironolactone.  Compliance is questionable.  Of note, she has OSA, for which she was referred to sleep specialist, and has done sleep study, but does not have CPAP device yet.  She has episodes of chest pain, for which she was referred to cardiologist, but has not seen them since the last visit. She denies any palpitations.  DM: Her HbA1c was 9.4 in 06/23.  She has been taking Levemir 60 units and NovoLog as per sliding scale.  She also takes Ozempic 2 mg qw. She checks her blood glucose before meals, and states it stays around 150-200 most of the time.  She has to take NovoLog 9 units before mealtime without any additional need for dose of insulin. She has not set up appointment with Dr. Dorris Fetch yet since last visit.  She states that she has been trying to follow low-carb diet.  Denies any polyuria or polyphagia currently.  HFpEF: She has chronic leg swelling and reports dyspnea upon minimal exertion.  Her morbid obesity is also likely contributing to her dyspnea.  She is on chlorthalidone and spironolactone currently.  Past Medical History:  Diagnosis Date   Anemia    Cataract    Cellulitis of toe of right foot 10/07/2016   Depression    Diabetes mellitus without complication [facility])    dx age 87   Diabetic  foot ulcer ([place]) 10/15/2016   Fatty liver disease, nonalcoholic    Glaucoma    Hyperlipidemia    Hypertension    Macular degeneration    Morbid obesity with BMI of 50.0-59.9, adult ([place]) 05/01/2017   Neuropathy    Obesity, Class III, BMI 40-49.9 (morbid obesity) ([place]) 05/01/2017   Osteolysis, right ankle and foot 05/01/2017   Osteomyelitis ([place]) 12/13/2016   Partial blindness    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   Suicidal ideation 11/04/2017    Past Surgical History:  Procedure Laterality Date   AMPUTATION TOE Right 12/16/2016   Procedure: RIGHT FIRST TOE AMPUTATION;  Surgeon: Vickie Epley, MD;  Location: [facility];  Service: General;  Laterality: Right;  Right first toe amputation for osteomyelitis   eye lid transplant     RETINAL LASER PROCEDURE      Family History  Problem Relation Age of Onset   Diabetes Mother    Thyroid disease Mother    Diabetes Father     Social History   Socioeconomic History   Marital status: Legally Separated    Spouse name: Rayvin Julio   Number of children: 0   Years of education: Not on file   Highest education level: High school graduate  Occupational History   Occupation: CNA personal care giver  Tobacco Use   Smoking status: Former    Packs/day: .25  Types: Cigarettes    Quit date: 12/11/2016    Years since quitting: 6.1   Smokeless tobacco: Never  Vaping Use   Vaping Use: Never used  Substance and Sexual Activity   Alcohol use: No   Drug use: No   Sexual activity: Yes    Partners: Male    Birth control/protection: None  Other Topics Concern   Not on file  Social History Narrative   Separated from husband-married for 7 years      Lives alone right now, in a disability apartment.   Right handed      Enjoys walking       Diet: eat one meal a day- veggies, meats, low carb    Caffeine: tea-4 cups daily    Water: 4-6 bottles of water      Wears seat belt   Smoke detectors    Does not use phone while driving    Social Determinants of Health   Financial Resource Strain: Low Risk  (12/27/2022)   Overall Financial Resource Strain (CARDIA)    Difficulty of Paying Living Expenses: Not hard at all  Food Insecurity: Food Insecurity Present (12/27/2022)   Hunger Vital Sign    Worried About Running Out of Food in the Last Year: Sometimes true    Ran Out of Food in the Last Year: Sometimes true  Transportation Needs: No Transportation Needs (12/27/2022)   PRAPARE - Hydrologist (Medical): No    Lack of Transportation (Non-Medical): No  Physical Activity: Insufficiently Active (12/27/2022)   Exercise Vital Sign    Days of Exercise per Week: 3 days    Minutes of Exercise per Session: 30 min  Stress: No Stress Concern Present (12/27/2022)   [place]    Feeling of Stress : Not at all  Social Connections: Moderately Isolated (12/27/2022)   Social Connection and Isolation Panel [NHANES]    Frequency of Communication with Friends and Family: Three times a week    Frequency of Social Gatherings with Friends and Family: Twice a week    Attends Religious Services: More than 4 times per year    Active Member of [employer] or Organizations: No    Attends Archivist Meetings: Never    Marital Status: Divorced  Human resources officer Violence: Not At Risk (12/27/2022)   Humiliation, Afraid, Rape, and Kick questionnaire    Fear of Current or Ex-Partner: No    Emotionally Abused: No    Physically Abused: No    Sexually Abused: No    Outpatient Medications Prior to Visit  Medication Sig Dispense Refill   albuterol (VENTOLIN HFA) 108 (90 Base) MCG/ACT inhaler Inhale 2 puffs into the lungs every 6 (six) hours as needed for wheezing or shortness of breath. 8 g 2   atorvastatin (LIPITOR) 20 MG tablet TAKE 1 TABLET BY MOUTH ONCE DAILY 6 IN THE EVENING 90 tablet 0   Azelastine-Fluticasone 137-50 MCG/ACT SUSP Place 1 spray  into the nose every 12 (twelve) hours. 23 g 1   bacitracin ointment Apply 1 Application topically 2 (two) times daily. 120 g 0   bacitracin-polymyxin b (POLYSPORIN) ophthalmic ointment PLACE OINTMENT INTO THE RIGHT EYE FOUR TIMES DAILY     Blood Glucose Monitoring Suppl (ACCU-CHEK GUIDE ME) w/Device KIT USE TESTING SUPPLIES TO TEST 4 TIMES DAILY. 1 kit 0   chlorthalidone (HYGROTON) 25 MG tablet Take 1 tablet by mouth once daily 90 tablet 0  diclofenac Sodium (VOLTAREN) 1 % GEL Apply 2 g topically 3 (three) times daily as needed. 50 g 0   dorzolamide-timolol (COSOPT) 22.3-6.8 MG/ML ophthalmic solution      ferrous sulfate 324 MG TBEC Take 65 mg by mouth.     gabapentin (NEURONTIN) 300 MG capsule Take 1 capsule (300 mg total) by mouth 2 (two) times daily. For pain 60 capsule 5   gentamicin cream (GARAMYCIN) 0.1 % Apply 1 Application topically 2 (two) times daily. 30 g 1   guaiFENesin (ROBITUSSIN) 100 MG/5ML liquid Take 5 mLs by mouth every 4 (four) hours as needed for cough or to loosen phlegm. 120 mL 0   insulin detemir (LEVEMIR FLEXPEN) 100 UNIT/ML FlexPen INJECT 50 UNITS UNDER THE SKIN AT BEDTIME 60 mL 0   levocetirizine (XYZAL) 5 MG tablet Take 1 tablet (5 mg total) by mouth every evening. 15 tablet 1   lisinopril-hydrochlorothiazide (ZESTORETIC) 20-12.5 MG tablet Take 1 tablet by mouth once daily 90 tablet 0   loperamide (IMODIUM) 2 MG capsule TAKE 1 CAPSULE BY MOUTH EVERY 6 HOURS 30 capsule 0   LUMIGAN 0.01 % SOLN INSTILL 1 DROP INTO LEFT EYE ONCE DAILY     mupirocin ointment (BACTROBAN) 2 % Apply 1 application topically 2 (two) times daily. 22 g 0   NOVOLOG FLEXPEN 100 UNIT/ML FlexPen INJECT 14 TO 20 UNITS INTO THE SKIN 3 (THREE) TIMES DAILY WITH MEALS. 60 mL 0   olopatadine (PATANOL) 0.1 % ophthalmic solution Place 1 drop into both eyes 2 times daily.     omeprazole (PRILOSEC) 40 MG capsule Take 1 capsule (40 mg total) by mouth daily. 30 capsule 5   Probiotic Product (BACID) CAPS Take 1  capsule by mouth in the morning and at bedtime. 30 capsule 2   Semaglutide, 2 MG/DOSE, 8 MG/3ML SOPN Inject 2 mg as directed once a week. 3 mL 3   spironolactone (ALDACTONE) 50 MG tablet TAKE 1 TABLET TWICE DAILY 180 tablet 0   amoxicillin-clavulanate (AUGMENTIN) 875-125 MG tablet Take 1 tablet by mouth 2 (two) times daily. 14 tablet 0   Continuous Blood Gluc Receiver ([place]) DEVI Please use it to check blood glucose as directed. 1 each 0   Continuous Blood Gluc Sensor (DEXCOM G7 SENSOR) MISC Please use it to check blood glucose as directed. 3 each 5   silver sulfADIAZINE (SILVADENE) 1 % cream Apply 1 Application topically daily. 50 g 0   Vitamin D, Ergocalciferol, (DRISDOL) 1.25 MG (50000 UNIT) CAPS capsule TAKE 1 CAPSULE EVERY WEEK 12 capsule 0   No facility-administered medications prior to visit.    Allergies  Allergen Reactions   Other Anaphylaxis   Shellfish Allergy Anaphylaxis, Shortness Of Breath and Swelling    Facial Swelling   Oxycodone Nausea And Vomiting   Percocet [Oxycodone-Acetaminophen] Itching and Nausea And Vomiting    ROS Review of Systems  Constitutional:  Positive for fatigue. Negative for chills and fever.  HENT:  Negative for congestion, sinus pressure, sinus pain and sore throat.   Eyes:  Positive for visual disturbance. Negative for pain and discharge.  Respiratory:  Positive for shortness of breath. Negative for cough.   Cardiovascular:  Positive for chest pain. Negative for palpitations.  Gastrointestinal:  Negative for nausea and vomiting.  Endocrine: Negative for polydipsia and polyuria.  Genitourinary:  Negative for dysuria and hematuria.  Musculoskeletal:  Negative for neck pain and neck stiffness.  Skin:  Positive for rash.  Neurological:  Negative for dizziness and weakness.  Psychiatric/Behavioral:  Positive for sleep disturbance. Negative for agitation and behavioral problems.       Objective:    Physical Exam Vitals reviewed.   Constitutional:      General: She is not in acute distress.    Appearance: She is obese. She is not diaphoretic.  HENT:     Head: Normocephalic and atraumatic.     Nose: Nose normal. No congestion.     Mouth/Throat:     Mouth: Mucous membranes are moist.     Pharynx: No posterior oropharyngeal erythema.  Eyes:     General: No scleral icterus. Cardiovascular:     Rate and Rhythm: Normal rate and regular rhythm.     Heart sounds: Normal heart sounds. No murmur heard.    Comments: DPA 1+ b/l Pulmonary:     Breath sounds: No wheezing or rales.     Comments: Decreased breath sounds in lower lung fields Abdominal:     Palpations: Abdomen is soft.     Tenderness: There is no abdominal tenderness.  Musculoskeletal:     Cervical back: Neck supple. No tenderness.     Comments: S/p amputation of great toe - right LE  Skin:    General: Skin is warm.  Neurological:     General: No focal deficit present.     Mental Status: She is alert and oriented to person, place, and time.     Sensory: Sensory deficit (B/l feet) present.     Motor: Weakness (B/l feet - 4/5) present.  Psychiatric:        Mood and Affect: Mood normal.        Behavior: Behavior normal.     BP (!) 156/74 (BP Location: Right Arm)   Pulse (!) 105   Ht 5\' 7"  (1.702 m)   Wt (!) 448 lb 6.4 oz (203.4 kg)   SpO2 98%   BMI 70.23 kg/m  Wt Readings from Last 3 Encounters:  02/13/23 (!) 448 lb 6.4 oz (203.4 kg)  01/10/23 (!) 438 lb (198.7 kg)  12/27/22 (!) 434 lb (196.9 kg)    Lab Results  Component Value Date   TSH 1.420 05/20/2022   Lab Results  Component Value Date   WBC 8.7 05/20/2022   HGB 11.0 (L) 05/20/2022   HCT 34.9 05/20/2022   MCV 81 05/20/2022   PLT 222 05/20/2022   Lab Results  Component Value Date   NA 140 02/13/2023   K 5.5 (H) 02/13/2023   CO2 17 (L) 02/13/2023   GLUCOSE 155 (H) 02/13/2023   BUN 34 (H) 02/13/2023   CREATININE 1.37 (H) 02/13/2023   BILITOT 0.2 02/13/2023   ALKPHOS 111  02/13/2023   AST 12 02/13/2023   ALT 11 02/13/2023   PROT 7.4 02/13/2023   ALBUMIN 4.0 02/13/2023   CALCIUM 9.6 02/13/2023   ANIONGAP 12 07/22/2019   EGFR 49 (L) 02/13/2023   Lab Results  Component Value Date   CHOL 121 02/13/2023   Lab Results  Component Value Date   HDL 43 02/13/2023   Lab Results  Component Value Date   LDLCALC 60 02/13/2023   Lab Results  Component Value Date   TRIG 92 02/13/2023   Lab Results  Component Value Date   CHOLHDL 2.8 02/13/2023   Lab Results  Component Value Date   HGBA1C 8.3 (H) 02/13/2023      Assessment & Plan:   Problem List Items Addressed This Visit       Cardiovascular and Mediastinum  Essential hypertension, benign    BP Readings from Last 1 Encounters:  02/13/23 (!) 156/74  Well-controlled with Lisinopril-HCTZ, Spironolactone and Chlorthalidone Added Imdur, would to help with chest pain Could be a component of pain and OSA, had referred for sleep study, needs CPAP device Counseled for compliance with the medications Advised DASH diet and moderate exercise/walking, at least 150 mins/week      Relevant Medications   isosorbide mononitrate (IMDUR) 30 MG 24 hr tablet   Chronic heart failure with preserved ejection fraction (HFpEF) (HCC)    Her leg swelling and orthopnea likely due to CHF Referred to cardiology for echo -also has episodes of chest pain, due to her history of type II DM and HTN with morbid obesity, at risk for CAD, needs to follow up  On lisinopril-HCTZ and Spironolactone On Chlorthalidone for uncontrolled HTN Plan to add Jardiance, but due to her current BMI and functional status, risk of vulvovaginitis      Relevant Medications   isosorbide mononitrate (IMDUR) 30 MG 24 hr tablet   Other Relevant Orders   B Nat Peptide (Completed)     Endocrine   Diabetic polyneuropathy associated with type 2 diabetes mellitus (HCC)    On gabapentin, needs to be compliant      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 Manuella Ghazi      Uncontrolled type 2 diabetes mellitus with hyperglycemia, with long-term current use of insulin ([place]) - Primary    Lab Results  Component Value Date   HGBA1C 8.3 (H) 02/13/2023  Uncontrolled, but improving On Levemir 60 U QD and Novolog sliding scale States it stays around 150-200, with only few readings above 200 Had added Ozempic, increased dose to 2 mg qw  Tried to send Dexcom G7, but was not able to get it - advised to find preferred DME from insurance provider Saw Dr Dorris Fetch in the past, had referral sent again  Advised to follow diabetic diet On ACE and statin F/u CMP and lipid panel Diabetic eye exam: Advised to follow up with Ophthalmology for diabetic eye exam      Relevant Medications   Continuous Blood Gluc Receiver ([place]) DEVI   Continuous Blood Gluc Sensor ([place]) MISC   Other Relevant Orders   CMP14+EGFR (Completed)   Hemoglobin A1c (Completed)     Other   Morbid obesity ([place])    Contributing to uncontrolled DM and HTN Likely has OSA On Ozempic for type 2 DM Emphasized to comply with low carb diet      Mixed hyperlipidemia    On Lipitor      Relevant Medications   isosorbide mononitrate (IMDUR) 30 MG 24 hr tablet   Other Relevant Orders   Lipid Profile (Completed)   Chest pain    Has resting chest pain, due to her history of type II DM, HTN and morbid obesity, at risk of CAD EKG: Sinus rhythm.  No signs of active ischemia. Referred to cardiology - needs to schedule appt      Relevant Medications   isosorbide mononitrate (IMDUR) 30 MG 24 hr tablet    Handicap placard form signed and provided.  Meds ordered this encounter  Medications   Continuous Blood Gluc Receiver (DEXCOM G7 RECEIVER) DEVI    Sig: Please use it to check blood glucose as directed.     Dispense:  1 each  Refill:  0   Continuous Blood Gluc Sensor (DEXCOM G7 SENSOR) MISC    Sig: Please use it to check blood glucose as directed.    Dispense:  3 each    Refill:  5   isosorbide mononitrate (IMDUR) 30 MG 24 hr tablet    Sig: Take 0.5 tablets (15 mg total) by mouth daily.    Dispense:  45 tablet    Refill:  1    Follow-up: Return in about 3 months (around 05/16/2023) for HTN and DM.    Lindell Spar, MD

## 2023-02-15 LAB — BRAIN NATRIURETIC PEPTIDE: BNP: 15.1 pg/mL (ref 0.0–100.0)

## 2023-02-15 LAB — CMP14+EGFR
ALT: 11 IU/L (ref 0–32)
AST: 12 IU/L (ref 0–40)
Albumin/Globulin Ratio: 1.2 (ref 1.2–2.2)
Albumin: 4 g/dL (ref 3.9–4.9)
Alkaline Phosphatase: 111 IU/L (ref 44–121)
BUN/Creatinine Ratio: 25 — ABNORMAL HIGH (ref 9–23)
BUN: 34 mg/dL — ABNORMAL HIGH (ref 6–24)
Bilirubin Total: 0.2 mg/dL (ref 0.0–1.2)
CO2: 17 mmol/L — ABNORMAL LOW (ref 20–29)
Calcium: 9.6 mg/dL (ref 8.7–10.2)
Chloride: 109 mmol/L — ABNORMAL HIGH (ref 96–106)
Creatinine, Ser: 1.37 mg/dL — ABNORMAL HIGH (ref 0.57–1.00)
Globulin, Total: 3.4 g/dL (ref 1.5–4.5)
Glucose: 155 mg/dL — ABNORMAL HIGH (ref 70–99)
Potassium: 5.5 mmol/L — ABNORMAL HIGH (ref 3.5–5.2)
Sodium: 140 mmol/L (ref 134–144)
Total Protein: 7.4 g/dL (ref 6.0–8.5)
eGFR: 49 mL/min/{1.73_m2} — ABNORMAL LOW (ref >59)

## 2023-02-15 LAB — LIPID PANEL
Chol/HDL Ratio: 2.8 ratio (ref 0.0–4.4)
Cholesterol, Total: 121 mg/dL (ref 100–199)
HDL: 43 mg/dL (ref >39)
LDL Chol Calc (NIH): 60 mg/dL (ref 0–99)
Triglycerides: 92 mg/dL (ref 0–149)
VLDL Cholesterol Cal: 18 mg/dL (ref 5–40)

## 2023-02-15 LAB — HEMOGLOBIN A1C
Est. average glucose Bld gHb Est-mCnc: 192 mg/dL
Hgb A1c MFr Bld: 8.3 % — ABNORMAL HIGH (ref 4.8–5.6)

## 2023-02-18 NOTE — Progress Notes (Signed)
3.19 returned call

## 2023-03-04 ENCOUNTER — Telehealth: Payer: Self-pay | Admitting: Internal Medicine

## 2023-03-04 NOTE — Telephone Encounter (Signed)
Mailed letter with lab results

## 2023-03-04 NOTE — Telephone Encounter (Signed)
Return call for labs  °

## 2023-03-12 ENCOUNTER — Ambulatory Visit: Payer: 59 | Admitting: Family Medicine

## 2023-03-21 ENCOUNTER — Other Ambulatory Visit: Payer: Self-pay | Admitting: Internal Medicine

## 2023-03-21 DIAGNOSIS — R195 Other fecal abnormalities: Secondary | ICD-10-CM

## 2023-03-21 DIAGNOSIS — K219 Gastro-esophageal reflux disease without esophagitis: Secondary | ICD-10-CM

## 2023-03-21 DIAGNOSIS — E1159 Type 2 diabetes mellitus with other circulatory complications: Secondary | ICD-10-CM

## 2023-03-22 ENCOUNTER — Other Ambulatory Visit: Payer: Self-pay | Admitting: Internal Medicine

## 2023-03-22 DIAGNOSIS — E1165 Type 2 diabetes mellitus with hyperglycemia: Secondary | ICD-10-CM

## 2023-03-24 ENCOUNTER — Other Ambulatory Visit: Payer: Self-pay | Admitting: Internal Medicine

## 2023-03-24 DIAGNOSIS — E1165 Type 2 diabetes mellitus with hyperglycemia: Secondary | ICD-10-CM

## 2023-03-24 MED ORDER — SEMAGLUTIDE (2 MG/DOSE) 8 MG/3ML ~~LOC~~ SOPN
2.0000 mg | PEN_INJECTOR | SUBCUTANEOUS | 5 refills | Status: DC
Start: 2023-03-24 — End: 2023-08-13

## 2023-03-25 ENCOUNTER — Telehealth: Payer: Self-pay | Admitting: Internal Medicine

## 2023-03-25 NOTE — Telephone Encounter (Signed)
Pcs  Noted  Copied Sleeved  Both copies at [employer]

## 2023-03-27 ENCOUNTER — Encounter: Payer: Self-pay | Admitting: Internal Medicine

## 2023-03-27 ENCOUNTER — Ambulatory Visit (INDEPENDENT_AMBULATORY_CARE_PROVIDER_SITE_OTHER): Payer: 59 | Admitting: Internal Medicine

## 2023-03-27 VITALS — BP 162/82 | HR 108 | Ht 67.0 in | Wt >= 6400 oz

## 2023-03-27 DIAGNOSIS — E1165 Type 2 diabetes mellitus with hyperglycemia: Secondary | ICD-10-CM | POA: Diagnosis not present

## 2023-03-27 DIAGNOSIS — Z794 Long term (current) use of insulin: Secondary | ICD-10-CM

## 2023-03-27 DIAGNOSIS — S81801A Unspecified open wound, right lower leg, initial encounter: Secondary | ICD-10-CM | POA: Diagnosis not present

## 2023-03-27 DIAGNOSIS — I10 Essential (primary) hypertension: Secondary | ICD-10-CM | POA: Diagnosis not present

## 2023-03-27 DIAGNOSIS — I739 Peripheral vascular disease, unspecified: Secondary | ICD-10-CM

## 2023-03-27 DIAGNOSIS — E875 Hyperkalemia: Secondary | ICD-10-CM | POA: Diagnosis not present

## 2023-03-27 DIAGNOSIS — L680 Hirsutism: Secondary | ICD-10-CM | POA: Diagnosis not present

## 2023-03-27 MED ORDER — MUPIROCIN 2 % EX OINT
1.0000 | TOPICAL_OINTMENT | Freq: Two times a day (BID) | CUTANEOUS | 0 refills | Status: DC
Start: 2023-03-27 — End: 2024-03-10

## 2023-03-27 MED ORDER — SPIRONOLACTONE 50 MG PO TABS
50.0000 mg | ORAL_TABLET | Freq: Two times a day (BID) | ORAL | 1 refills | Status: AC
Start: 2023-03-27 — End: ?

## 2023-03-27 NOTE — Assessment & Plan Note (Signed)
Lab Results  Component Value Date   HGBA1C 8.3 (H) 02/13/2023   Uncontrolled, but improving On Levemir 60 U QD and Novolog sliding scale States it stays around 150-200, with only few readings above 200 Had added Ozempic, increased dose to 2 mg qw  Tried to send Dexcom G7, but was not able to get it - advised to find preferred DME from insurance provider Saw Dr Nida in the past, had referral sent again  Advised to follow diabetic diet On ACE and statin F/u CMP and lipid panel Diabetic eye exam: Advised to follow up with Ophthalmology for diabetic eye exam 

## 2023-03-27 NOTE — Assessment & Plan Note (Signed)
Takes Spironolactone 50 mg BID 

## 2023-03-27 NOTE — Assessment & Plan Note (Signed)
BP Readings from Last 1 Encounters:  03/27/23 (!) 162/82   Uncontrolled with Lisinopril-HCTZ and Chlorthalidone Had added Imdur to help with chest pain Has run out of Spironolactone, refilled today Could be a component of pain and OSA, had referred for sleep study, needs CPAP device Counseled for compliance with the medications Advised DASH diet and moderate exercise/walking, at least 150 mins/week

## 2023-03-27 NOTE — Assessment & Plan Note (Addendum)
Has weak DPA pulse with likely venous ulcer Check [place] of [place] arterial study Advised to start taking aspirin 81 mg QD On statin currently Plan to add Plavix based on [place] of [place] arterial study

## 2023-03-27 NOTE — Patient Instructions (Signed)
Please apply Mupirocin ointment over right leg.  You are being referred to Wound care center.  Please get [place] of [place] done for peripheral vascular disease.

## 2023-03-27 NOTE — Assessment & Plan Note (Signed)
Last CMP showed hyperkalemia Recheck BMP She is on lisinopril-HCTZ and spironolactone, which can contribute to hyperkalemia, but has been on chlorthalidone since last CMP as well

## 2023-03-27 NOTE — Assessment & Plan Note (Addendum)
Poor healing of the right leg wound - concern for venous ulcer Mupirocin ointment for bacterial PPx Has weak DPA pulse, concern for PAD Referred to wound care clinic Needs [place] of [place] arterial study for PAD

## 2023-03-27 NOTE — Progress Notes (Addendum)
Acute Office Visit  Subjective:    Patient ID: Rachel George, female    DOB: [birth year], 42 y.o.   MRN: [id number]  Chief Complaint  Patient presents with   referral wanted    Patient would like to be referred to wound care    HPI Patient is in today for complaint of right leg wound.  She has had repetitive injury on the right leg due to hitting it on her bed while walking.  She has had poor healing of the wound for the last 1 month.  She has open wound currently, with serous discharge.  Denies any puslike discharge currently.  Denies any fever or chills.  Of note, she has history of uncontrolled HTN and DM with CKD and diabetic retinopathy.  HTN: Her BP is still elevated.  She has run out of spironolactone currently.  She was recently placed on chlorthalidone and Imdur.  Her chest pain episodes have resolved now with Imdur.  She also takes lisinopril-HCTZ currently.  Denies any headache, dizziness, or palpitations currently.  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   Suicidal ideation 11/04/2017    Past Surgical History:  Procedure Laterality Date   AMPUTATION TOE Right 12/16/2016   Procedure: RIGHT FIRST TOE AMPUTATION;  Surgeon: Ancil Linsey, MD;  Location: [facility];  Service: General;  Laterality: Right;  Right first toe amputation for osteomyelitis   eye lid transplant     RETINAL LASER PROCEDURE      Family History  Problem  Relation Age of Onset   Diabetes Mother    Thyroid disease Mother    Diabetes Father     Social History   Socioeconomic History   Marital status: Legally Separated    Spouse name: Rachel George   Number of children: 0   Years of education: Not on file   Highest education level: High school graduate  Occupational History   Occupation: CNA personal care giver  Tobacco Use   Smoking status: Former    Packs/day: .25    Types: Cigarettes    Quit date: 12/11/2016    Years since quitting: 6.2   Smokeless tobacco: Never  Vaping Use   Vaping Use: Never used  Substance and Sexual Activity   Alcohol use: No   Drug use: No   Sexual activity: Yes    Partners: Male    Birth control/protection: None  Other Topics Concern   Not on file  Social History Narrative   Separated from husband-married for 7 years      Lives alone right now, in a disability apartment.   Right handed      Enjoys walking       Diet: eat one meal a day- veggies, meats, low carb    Caffeine: tea-4 cups daily    Water: 4-6 bottles of  water      Wears seat belt   Smoke detectors    Does not use phone while driving   Social Determinants of Health   Financial Resource Strain: Low Risk  (12/27/2022)   Overall Financial Resource Strain (CARDIA)    Difficulty of Paying Living Expenses: Not hard at all  Food Insecurity: Food Insecurity Present (12/27/2022)   Hunger Vital Sign    Worried About Running Out of Food in the Last Year: Sometimes true    Ran Out of Food in the Last Year: Sometimes true  Transportation Needs: No Transportation Needs (12/27/2022)   PRAPARE - Administrator, Civil Service (Medical): No    Lack of Transportation (Non-Medical): No  Physical Activity: Insufficiently Active (12/27/2022)   Exercise Vital Sign    Days of Exercise per Week: 3 days    Minutes of Exercise per Session: 30 min  Stress: No Stress Concern Present (12/27/2022)   [employer] of Occupational Health  - Occupational Stress Questionnaire    Feeling of Stress : Not at all  Social Connections: Moderately Isolated (12/27/2022)   Social Connection and Isolation Panel [NHANES]    Frequency of Communication with Friends and Family: Three times a week    Frequency of Social Gatherings with Friends and Family: Twice a week    Attends Religious Services: More than 4 times per year    Active Member of [employer] or Organizations: No    Attends Banker Meetings: Never    Marital Status: Divorced  Catering manager Violence: Not At Risk (12/27/2022)   Humiliation, Afraid, Rape, and Kick questionnaire    Fear of Current or Ex-Partner: No    Emotionally Abused: No    Physically Abused: No    Sexually Abused: No    Outpatient Medications Prior to Visit  Medication Sig Dispense Refill   albuterol (VENTOLIN HFA) 108 (90 Base) MCG/ACT inhaler Inhale 2 puffs into the lungs every 6 (six) hours as needed for wheezing or shortness of breath. 8 g 2   atorvastatin (LIPITOR) 20 MG tablet TAKE 1 TABLET BY MOUTH ONCE DAILY 6 IN THE EVENING 90 tablet 0   bacitracin-polymyxin b (POLYSPORIN) ophthalmic ointment PLACE OINTMENT INTO THE RIGHT EYE FOUR TIMES DAILY     Blood Glucose Monitoring Suppl (ACCU-CHEK GUIDE ME) w/Device KIT USE TESTING SUPPLIES TO TEST 4 TIMES DAILY. 1 kit 0   chlorthalidone (HYGROTON) 25 MG tablet Take 1 tablet by mouth once daily 90 tablet 0   Continuous Blood Gluc Receiver (DEXCOM G7 RECEIVER) DEVI Please use it to check blood glucose as directed. 1 each 0   Continuous Blood Gluc Sensor (DEXCOM G7 SENSOR) MISC Please use it to check blood glucose as directed. 3 each 5   diclofenac Sodium (VOLTAREN) 1 % GEL Apply 2 g topically 3 (three) times daily as needed. 50 g 0   dorzolamide-timolol (COSOPT) 22.3-6.8 MG/ML ophthalmic solution      ferrous sulfate 324 MG TBEC Take 65 mg by mouth.     gabapentin (NEURONTIN) 300 MG capsule Take 1 capsule (300 mg total) by mouth 2 (two) times daily.  For pain 60 capsule 5   gentamicin cream (GARAMYCIN) 0.1 % Apply 1 Application topically 2 (two) times daily. 30 g 1   guaiFENesin (ROBITUSSIN) 100 MG/5ML liquid Take 5 mLs by mouth every 4 (four) hours as needed for cough or to loosen phlegm. 120 mL 0   insulin detemir (LEVEMIR FLEXPEN) 100 UNIT/ML FlexPen INJECT 50 UNITS  UNDER THE SKIN AT BEDTIME 60 mL 0   isosorbide mononitrate (IMDUR) 30 MG 24 hr tablet Take 0.5 tablets (15 mg total) by mouth daily. 45 tablet 1   levocetirizine (XYZAL) 5 MG tablet Take 1 tablet (5 mg total) by mouth every evening. 15 tablet 1   lisinopril-hydrochlorothiazide (ZESTORETIC) 20-12.5 MG tablet Take 1 tablet by mouth once daily 90 tablet 0   loperamide (IMODIUM) 2 MG capsule TAKE 1 CAPSULE BY MOUTH EVERY 6 HOURS 30 capsule 0   LUMIGAN 0.01 % SOLN INSTILL 1 DROP INTO LEFT EYE ONCE DAILY     NOVOLOG FLEXPEN 100 UNIT/ML FlexPen INJECT 14 TO 20 UNITS INTO THE SKIN 3 (THREE) TIMES DAILY WITH MEALS. 60 mL 0   olopatadine (PATANOL) 0.1 % ophthalmic solution Place 1 drop into both eyes 2 times daily.     omeprazole (PRILOSEC) 40 MG capsule Take 1 capsule by mouth once daily 30 capsule 0   Probiotic Product (BACID) CAPS Take 1 capsule by mouth in the morning and at bedtime. 30 capsule 2   Semaglutide, 2 MG/DOSE, 8 MG/3ML SOPN Inject 2 mg as directed once a week. 3 mL 5   Azelastine-Fluticasone 137-50 MCG/ACT SUSP Place 1 spray into the nose every 12 (twelve) hours. 23 g 1   bacitracin ointment Apply 1 Application topically 2 (two) times daily. 120 g 0   mupirocin ointment (BACTROBAN) 2 % Apply 1 application topically 2 (two) times daily. 22 g 0   spironolactone (ALDACTONE) 50 MG tablet TAKE 1 TABLET TWICE DAILY 180 tablet 0   No facility-administered medications prior to visit.    Allergies  Allergen Reactions   Other Anaphylaxis   Shellfish Allergy Anaphylaxis, Shortness Of Breath and Swelling    Facial Swelling   Oxycodone Nausea And Vomiting   Percocet  [Oxycodone-Acetaminophen] Itching and Nausea And Vomiting    Review of Systems  Constitutional:  Positive for fatigue. Negative for chills and fever.  HENT:  Negative for congestion, sinus pressure, sinus pain and sore throat.   Eyes:  Positive for visual disturbance. Negative for pain and discharge.  Respiratory:  Positive for shortness of breath. Negative for cough.   Cardiovascular:  Negative for chest pain and palpitations.  Gastrointestinal:  Negative for nausea and vomiting.  Endocrine: Negative for polydipsia and polyuria.  Genitourinary:  Negative for dysuria and hematuria.  Musculoskeletal:  Negative for neck pain and neck stiffness.  Skin:  Positive for rash and wound (Right leg).  Neurological:  Negative for dizziness and weakness.  Psychiatric/Behavioral:  Positive for sleep disturbance. Negative for agitation and behavioral problems.        Objective:    Physical Exam Vitals reviewed.  Constitutional:      General: She is not in acute distress.    Appearance: She is obese. She is not diaphoretic.  HENT:     Head: Normocephalic and atraumatic.     Nose: Nose normal. No congestion.     Mouth/Throat:     Mouth: Mucous membranes are moist.     Pharynx: No posterior oropharyngeal erythema.  Eyes:     General: No scleral icterus. Cardiovascular:     Rate and Rhythm: Normal rate and regular rhythm.     Heart sounds: Normal heart sounds. No murmur heard.    Comments: DPA 1+ b/l Pulmonary:     Breath sounds: No wheezing or rales.     Comments: Decreased breath sounds in lower lung fields Abdominal:     Palpations: Abdomen is  soft.     Tenderness: There is no abdominal tenderness.  Musculoskeletal:     Cervical back: Neck supple. No tenderness.     Comments: S/p amputation of great toe - right LE  Skin:    General: Skin is warm.     Comments: Right leg wound - about 5 cm in diameter, with surrounding necrosis  Neurological:     General: No focal deficit present.      Mental Status: She is alert and oriented to person, place, and time.     Sensory: Sensory deficit (B/l feet) present.     Motor: Weakness (B/l feet - 4/5) present.  Psychiatric:        Mood and Affect: Mood normal.        Behavior: Behavior normal.     BP (!) 162/82 (BP Location: Right Arm)   Pulse (!) 108   Ht  (1.702 m)   Wt (!) 445 lb 6.4 oz (202 kg)   SpO2 95%   BMI 69.76 kg/m  Wt Readings from Last 3 Encounters:  03/27/23 (!) 445 lb 6.4 oz (202 kg)  02/13/23 (!) 448 lb 6.4 oz (203.4 kg)  01/10/23 (!) 438 lb (198.7 kg)        Assessment & Plan:   Problem List Items Addressed This Visit       Cardiovascular and Mediastinum   Essential hypertension, benign    BP Readings from Last 1 Encounters:  03/27/23 (!) 162/82  Uncontrolled with Lisinopril-HCTZ and Chlorthalidone Had added Imdur to help with chest pain Has run out of Spironolactone, refilled today Could be a component of pain and OSA, had referred for sleep study, needs CPAP device Counseled for compliance with the medications Advised DASH diet and moderate exercise/walking, at least 150 mins/week      Relevant Medications   spironolactone (ALDACTONE) 50 MG tablet   PAD (peripheral artery disease)    Has weak DPA pulse with likely venous ulcer Check [place] of [place] arterial study Advised to start taking aspirin 81 mg QD On statin currently Plan to add Plavix based on [place] of [place] arterial study      Relevant Medications   spironolactone (ALDACTONE) 50 MG tablet   Other Relevant Orders   [place] Lower Ext Art Bilat     Endocrine   Uncontrolled type 2 diabetes mellitus with hyperglycemia, with long-term current use of insulin    Lab Results  Component Value Date   HGBA1C 8.3 (H) 02/13/2023  Uncontrolled, but improving On Levemir 60 U QD and Novolog sliding scale States it stays around 150-200, with only few readings above 200 Had added Ozempic, increased dose to 2 mg qw  Tried to send Dexcom G7, but  was not able to get it - advised to find preferred DME from insurance provider Saw Dr Fransico Him in the past, had referral sent again  Advised to follow diabetic diet On ACE and statin F/u CMP and lipid panel Diabetic eye exam: Advised to follow up with Ophthalmology for diabetic eye exam      Relevant Orders   Basic Metabolic Panel (BMET)   Hemoglobin A1c     Musculoskeletal and Integument   Hirsutism    Takes Spironolactone 50 mg BID      Relevant Medications   spironolactone (ALDACTONE) 50 MG tablet     Other   Leg wound, right, initial encounter - Primary    Poor healing of the right leg wound - concern for venous ulcer Mupirocin  ointment for bacterial PPx Has weak DPA pulse, concern for PAD Referred to wound care clinic Needs [place] of [place] arterial study for PAD      Relevant Medications   mupirocin ointment (BACTROBAN) 2 %   Other Relevant Orders   AMB referral to wound care center   Hyperkalemia    Last CMP showed hyperkalemia Recheck BMP She is on lisinopril-HCTZ and spironolactone, which can contribute to hyperkalemia, but has been on chlorthalidone since last CMP as well      Relevant Orders   Basic Metabolic Panel (BMET)     Meds ordered this encounter  Medications   mupirocin ointment (BACTROBAN) 2 %    Sig: Apply 1 Application topically 2 (two) times daily.    Dispense:  22 g    Refill:  0   spironolactone (ALDACTONE) 50 MG tablet    Sig: Take 1 tablet (50 mg total) by mouth 2 (two) times daily.    Dispense:  180 tablet    Refill:  1     Jeily Guthridge Concha Se, MD

## 2023-03-28 ENCOUNTER — Telehealth: Payer: Self-pay | Admitting: Internal Medicine

## 2023-03-28 NOTE — Telephone Encounter (Signed)
[facility] pharmacy called patient on 2 meds with drug interaction, make sure provider was aware of this?  Call [phone] # [phone].

## 2023-03-28 NOTE — Telephone Encounter (Signed)
Morrie Sheldon at [facility] advised

## 2023-04-02 ENCOUNTER — Ambulatory Visit: Payer: 59 | Admitting: Family Medicine

## 2023-04-07 ENCOUNTER — Ambulatory Visit: Payer: 59 | Admitting: Family Medicine

## 2023-04-09 ENCOUNTER — Ambulatory Visit: Payer: 59 | Admitting: Family Medicine

## 2023-04-10 ENCOUNTER — Other Ambulatory Visit: Payer: Self-pay | Admitting: Internal Medicine

## 2023-04-10 DIAGNOSIS — E785 Hyperlipidemia, unspecified: Secondary | ICD-10-CM

## 2023-04-10 DIAGNOSIS — I10 Essential (primary) hypertension: Secondary | ICD-10-CM

## 2023-04-24 ENCOUNTER — Ambulatory Visit
Admission: EM | Admit: 2023-04-24 | Discharge: 2023-04-24 | Disposition: A | Discharge status: Home / Self Care | Payer: 59 | Attending: Nurse Practitioner | Admitting: Nurse Practitioner

## 2023-04-24 ENCOUNTER — Other Ambulatory Visit: Payer: Self-pay

## 2023-04-24 ENCOUNTER — Encounter: Payer: Self-pay | Admitting: Emergency Medicine

## 2023-04-24 DIAGNOSIS — L02811 Cutaneous abscess of head [any part, except face]: Secondary | ICD-10-CM

## 2023-04-24 MED ORDER — DOXYCYCLINE HYCLATE 100 MG PO CAPS: 100.0000 mg | ORAL_CAPSULE | Freq: Two times a day (BID) | ORAL | 0 refills | Status: AC

## 2023-04-24 MED ORDER — IBUPROFEN 600 MG PO TABS: 600.0000 mg | ORAL_TABLET | Freq: Three times a day (TID) | ORAL | 0 refills | Status: DC | PRN

## 2023-04-24 NOTE — ED Notes (Signed)
Site dressed with bacitracin ointment, gauze, perforated medical tape. Pt tolerated well.   Site management and infection prevention education reviewed. Pt verbalized understanding.

## 2023-04-24 NOTE — Discharge Instructions (Addendum)
We attempted to drain the abscess on your scalp today; not very much was able to be drained.  Start the doxycycline to treat underlying infection and take Tylenol [phone] mg every 6 hours alternating with ibuprofen 600 mg every 8 hours as needed for pain.   Seek care if symptoms persist or worsen despite treatment.

## 2023-04-24 NOTE — ED Provider Notes (Signed)
[facility]    CSN: [id number] Arrival date & time: 04/24/23  1357      History   Chief Complaint Chief Complaint  Patient presents with   Skin Problem    HPI Rachel George is a 42 y.o. female.   Patient presents today with 3-day history of tenderness on the back right side of her scalp.  Reports she has been itching in her scalp and has been applying an oil to her scalp for the itchiness and thinks she may have developed an infection in the scalp.  She denies fever, body aches or chills, nausea/vomiting, however does feel poorly today.  Reports she has had decreased appetite, however has been checking her blood sugars which have been in the low 100s.    Past Medical History:  Diagnosis Date   Anemia    Cataract    Cellulitis of toe of right foot 10/07/2016   Depression    Diabetes mellitus without complication [facility])    dx age 14   Diabetic foot ulcer (HCC) 10/15/2016   Fatty liver disease, nonalcoholic    Glaucoma    Hyperlipidemia    Hypertension    Macular degeneration    Morbid obesity with BMI of 50.0-59.9, adult (HCC) 05/01/2017   Neuropathy    Obesity, Class III, BMI 40-49.9 (morbid obesity) (HCC) 05/01/2017   Osteolysis, right ankle and foot 05/01/2017   Osteomyelitis (HCC) 12/13/2016   Partial blindness    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   Suicidal ideation 11/04/2017    Patient Active Problem List   Diagnosis Date Noted   Leg wound, right, initial encounter 03/27/2023   Hyperkalemia 03/27/2023   PAD (peripheral artery disease) (HCC) 03/27/2023   Burn 07/26/2022   Physical deconditioning 07/26/2022   RLQ abdominal mass 05/09/2022   Chronic heart failure with preserved ejection fraction (HFpEF) (HCC) 05/09/2022   Chest pain 05/09/2022   OSA (obstructive sleep apnea) 05/09/2022   Chronic sinusitis 01/21/2022   Ulcer of right lower extremity (HCC) 08/14/2021   Irregular periods/menstrual cycles 10/04/2020   Hirsutism 06/20/2020    DM (diabetes mellitus), type 2, uncontrolled w/ophthalmic complication 03/16/2020   Essential hypertension, benign 03/16/2020   Chronic pain of both knees 03/01/2020   Vitamin D deficiency 01/30/2020   Hypertension associated with diabetes (HCC) 01/30/2020   Gastroesophageal reflux disease 01/30/2020   Major depressive disorder, recurrent severe without psychotic features (HCC) 03/28/2019   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 01/13/2018   Mixed hyperlipidemia 10/07/2017   Neovascular glaucoma of right eye, moderate stage 09/16/2017   Nuclear sclerotic cataract of both eyes 09/16/2017   Posterior synechiae, right eye 09/16/2017   Uncontrolled type 2 diabetes mellitus with hyperglycemia, with long-term current use of insulin (HCC) 05/01/2017   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, associated with type 2 diabetes mellitus (HCC) 03/18/2017   Morbid obesity (HCC) 09/09/2016   Diabetic polyneuropathy associated with type 2 diabetes mellitus (HCC) 09/09/2016    Past Surgical History:  Procedure Laterality Date   AMPUTATION TOE Right 12/16/2016   Procedure: RIGHT FIRST TOE AMPUTATION;  Surgeon: Ancil Linsey, MD;  Location: [facility];  Service: General;  Laterality: Right;  Right first toe amputation for osteomyelitis   eye lid transplant     RETINAL LASER PROCEDURE      OB History     Gravida  0   Para  0   Term  0  Preterm  0   AB  0   Living  0      SAB  0   IAB  0   Ectopic  0   Multiple  0   Live Births  0            Home Medications    Prior to Admission medications   Medication Sig Start Date End Date Taking? Authorizing Provider  doxycycline (VIBRAMYCIN) 100 MG capsule Take 1 capsule (100 mg total) by mouth 2 (two) times daily for 7 days. 04/24/23 05/01/23 Yes Valentino Nose, NP  ibuprofen (ADVIL) 600 MG tablet Take 1 tablet (600 mg total) by mouth every 8  (eight) hours as needed for moderate pain. 04/24/23  Yes Valentino Nose, NP  albuterol (VENTOLIN HFA) 108 (90 Base) MCG/ACT inhaler Inhale 2 puffs into the lungs every 6 (six) hours as needed for wheezing or shortness of breath. 01/10/23   Del Newman Nip, Tenna Child, FNP  atorvastatin (LIPITOR) 20 MG tablet TAKE 1 TABLET BY MOUTH ONCE DAILY IN THE EVENING 04/10/23   Anabel Halon, MD  bacitracin-polymyxin b (POLYSPORIN) ophthalmic ointment PLACE OINTMENT INTO THE RIGHT EYE FOUR TIMES DAILY 07/17/22   [provider]  Blood Glucose Monitoring Suppl (ACCU-CHEK GUIDE ME) w/Device KIT USE TESTING SUPPLIES TO TEST 4 TIMES DAILY. 12/07/21   Anabel Halon, MD  chlorthalidone (HYGROTON) 25 MG tablet Take 1 tablet by mouth once daily 03/21/23   Anabel Halon, MD  Continuous Blood Gluc Receiver (DEXCOM G7 RECEIVER) DEVI Please use it to check blood glucose as directed. 02/13/23   Anabel Halon, MD  Continuous Blood Gluc Sensor (DEXCOM G7 SENSOR) MISC Please use it to check blood glucose as directed. 02/13/23   Anabel Halon, MD  diclofenac Sodium (VOLTAREN) 1 % GEL Apply 2 g topically 3 (three) times daily as needed. 03/01/20   Freddy Finner, NP  dorzolamide-timolol (COSOPT) 22.3-6.8 MG/ML ophthalmic solution  03/12/21   [provider]  ferrous sulfate 324 MG TBEC Take 65 mg by mouth.    [provider]  gabapentin (NEURONTIN) 300 MG capsule Take 1 capsule (300 mg total) by mouth 2 (two) times daily. For pain 12/10/21   Anabel Halon, MD  gentamicin cream (GARAMYCIN) 0.1 % Apply 1 Application topically 2 (two) times daily. 07/15/22   Felecia Shelling, DPM  guaiFENesin (ROBITUSSIN) 100 MG/5ML liquid Take 5 mLs by mouth every 4 (four) hours as needed for cough or to loosen phlegm. 01/10/23   Del Newman Nip, Tenna Child, FNP  insulin detemir (LEVEMIR FLEXPEN) 100 UNIT/ML FlexPen INJECT 50 UNITS UNDER THE SKIN AT BEDTIME 09/16/22   Anabel Halon, MD  isosorbide mononitrate (IMDUR) 30 MG 24  hr tablet Take 0.5 tablets (15 mg total) by mouth daily. 02/13/23   Anabel Halon, MD  levocetirizine (XYZAL) 5 MG tablet Take 1 tablet (5 mg total) by mouth every evening. 01/10/23   Del Nigel Berthold, FNP  lisinopril-hydrochlorothiazide (ZESTORETIC) 20-12.5 MG tablet Take 1 tablet by mouth once daily 04/10/23   Anabel Halon, MD  loperamide (IMODIUM) 2 MG capsule TAKE 1 CAPSULE BY MOUTH EVERY 6 HOURS 03/21/23   Anabel Halon, MD  LUMIGAN 0.01 % SOLN INSTILL 1 DROP INTO LEFT EYE ONCE DAILY 07/17/22   [provider]  mupirocin ointment (BACTROBAN) 2 % Apply 1 Application topically 2 (two) times daily. 03/27/23   Anabel Halon, MD  NOVOLOG FLEXPEN 100 UNIT/ML FlexPen  INJECT 14 TO 20 UNITS INTO THE SKIN 3 (THREE) TIMES DAILY WITH MEALS. 08/01/22   Anabel Halon, MD  olopatadine (PATANOL) 0.1 % ophthalmic solution Place 1 drop into both eyes 2 times daily. 01/18/22   [provider]  omeprazole (PRILOSEC) 40 MG capsule Take 1 capsule by mouth once daily 03/21/23   Anabel Halon, MD  Probiotic Product [facility]) CAPS Take 1 capsule by mouth in the morning and at bedtime. 01/30/21   Anabel Halon, MD  Semaglutide, 2 MG/DOSE, 8 MG/3ML SOPN Inject 2 mg as directed once a week. 03/24/23   Anabel Halon, MD  spironolactone (ALDACTONE) 50 MG tablet Take 1 tablet (50 mg total) by mouth 2 (two) times daily. 03/27/23   Anabel Halon, MD    Family History Family History  Problem Relation Age of Onset   Diabetes Mother    Thyroid disease Mother    Diabetes Father     Social History Social History   Tobacco Use   Smoking status: Former    Packs/day: .25    Types: Cigarettes    Quit date: 12/11/2016    Years since quitting: 6.3   Smokeless tobacco: Never  Vaping Use   Vaping Use: Never used  Substance Use Topics   Alcohol use: No   Drug use: No     Allergies   Other, Shellfish allergy, Oxycodone, and Percocet [oxycodone-acetaminophen]   Review of Systems Review of  Systems Per HPI  Physical Exam Triage Vital Signs ED Triage Vitals [04/24/23 1410]  Enc Vitals Group     BP (!) 176/96     Pulse Rate 97     Resp 20     Temp 98 F (36.7 C)     Temp Source Oral     SpO2 97 %     Weight      Height      Head Circumference      Peak Flow      Pain Score 7     Pain Loc      Pain Edu?      Excl. in GC?    No data found.  Updated Vital Signs BP (!) 176/96 (BP Location: Right Arm)   Pulse 97   Temp 98 F (36.7 C) (Oral)   Resp 20   LMP 04/11/2023 (Approximate)   SpO2 97%   Visual Acuity Right Eye Distance:   Left Eye Distance:   Bilateral Distance:    Right Eye Near:   Left Eye Near:    Bilateral Near:     Physical Exam Vitals and nursing note reviewed.  Constitutional:      General: She is not in acute distress.    Appearance: Normal appearance. She is not toxic-appearing.  HENT:     Head: Normocephalic and atraumatic.      Comments: Abscess to right posterior scalp in approximately area marked; area is tender to touch and firm.  No active drainage, redness, or warmth.    Mouth/Throat:     Mouth: Mucous membranes are moist.     Pharynx: Oropharynx is clear.  Pulmonary:     Effort: Pulmonary effort is normal. No respiratory distress.  Skin:    Findings: Abscess present.  Neurological:     Mental Status: She is alert and oriented to person, place, and time.  Psychiatric:        Behavior: Behavior is cooperative.      UC Treatments / Results  Labs (all  labs ordered are listed, but only abnormal results are displayed) Labs Reviewed - No data to display  EKG   Radiology No results found.  Procedures Incision and Drainage  Date/Time: 04/24/2023 2:58 PM  Performed by: Valentino Nose, NP Authorized by: Valentino Nose, NP   Consent:    Consent obtained:  Verbal   Consent given by:  Patient   Risks, benefits, and alternatives were discussed: yes     Risks discussed:  Bleeding, incomplete drainage,  pain and infection   Alternatives discussed:  Alternative treatment (oral antibiotics only) Universal protocol:    Procedure explained and questions answered to patient or proxy's satisfaction: yes     Patient identity confirmed:  Verbally with patient Location:    Type:  Abscess   Size:  1.5 x 1.5 cm   Location:  Head   Head location:  Scalp Pre-procedure details:    Skin preparation:  Chlorhexidine Sedation:    Sedation type:  None Anesthesia:    Anesthesia method:  Local infiltration   Local anesthetic:  Lidocaine 2% w/o epi Procedure type:    Complexity:  Simple Procedure details:    Ultrasound guidance: no     Needle aspiration: no     Incision types:  Stab incision   Incision depth:  Dermal   Drainage:  Bloody   Drainage amount:  Scant   Wound treatment:  Wound left open   Packing materials:  None Post-procedure details:    Procedure completion:  Tolerated well, no immediate complications  (including critical care time)  Medications Ordered in UC Medications - No data to display  Initial Impression / Assessment and Plan / UC Course  I have reviewed the triage vital signs and the nursing notes.  Pertinent labs & imaging results that were available during my care of the patient were reviewed by me and considered in my medical decision making (see chart for details).   Patient is well-appearing, afebrile, not tachycardic, not tachypneic, oxygenating well on room air.  Patient is mildly hypertensive today in urgent care.  1. Scalp abscess I initially recommended observation and starting oral antibiotic therapy, however patient requests incision and drainage Incision and drainage performed as above Scant bloody drainage removed from abscess Continue warm compresses, start ibuprofen, start doxycycline Creatinine clearance greater than 60; therefore ibuprofen and renal dosing not indicated Seek care for persistent or worsening symptoms despite treatment  The patient  was given the opportunity to ask questions.  All questions answered to their satisfaction.  The patient is in agreement to this plan.    Final Clinical Impressions(s) / UC Diagnoses   Final diagnoses:  Scalp abscess     Discharge Instructions      We attempted to drain the abscess on your scalp today; not very much was able to be drained.  Start the doxycycline to treat underlying infection and take Tylenol [phone] mg every 6 hours alternating with ibuprofen 600 mg every 8 hours as needed for pain.   Seek care if symptoms persist or worsen despite treatment.     ED Prescriptions     Medication Sig Dispense Auth. Provider   doxycycline (VIBRAMYCIN) 100 MG capsule Take 1 capsule (100 mg total) by mouth 2 (two) times daily for 7 days. 14 capsule Cathlean Marseilles A, NP   ibuprofen (ADVIL) 600 MG tablet Take 1 tablet (600 mg total) by mouth every 8 (eight) hours as needed for moderate pain. 30 tablet Valentino Nose, NP  PDMP not reviewed this encounter.   Valentino Nose, NP 04/24/23 1459

## 2023-04-24 NOTE — ED Triage Notes (Signed)
Pt reports scalp tenderness to back of head x3 days. Pt reports possible infection and would like it evaluated.

## 2023-04-30 ENCOUNTER — Other Ambulatory Visit: Payer: Self-pay | Admitting: Internal Medicine

## 2023-04-30 DIAGNOSIS — K219 Gastro-esophageal reflux disease without esophagitis: Secondary | ICD-10-CM

## 2023-04-30 DIAGNOSIS — R195 Other fecal abnormalities: Secondary | ICD-10-CM

## 2023-04-30 DIAGNOSIS — E785 Hyperlipidemia, unspecified: Secondary | ICD-10-CM

## 2023-05-05 ENCOUNTER — Ambulatory Visit: Payer: 59 | Admitting: Family Medicine

## 2023-05-12 ENCOUNTER — Ambulatory Visit: Payer: 59 | Admitting: Family Medicine

## 2023-05-20 ENCOUNTER — Ambulatory Visit: Payer: 59 | Admitting: Internal Medicine

## 2023-05-26 ENCOUNTER — Ambulatory Visit: Payer: 59 | Admitting: Internal Medicine

## 2023-05-30 ENCOUNTER — Ambulatory Visit: Payer: 59 | Admitting: Podiatry

## 2023-05-30 ENCOUNTER — Ambulatory Visit: Payer: 59 | Admitting: Family Medicine

## 2023-06-09 ENCOUNTER — Ambulatory Visit (INDEPENDENT_AMBULATORY_CARE_PROVIDER_SITE_OTHER): Payer: 59 | Admitting: Podiatry

## 2023-06-09 ENCOUNTER — Ambulatory Visit: Payer: 59 | Admitting: Family Medicine

## 2023-06-09 ENCOUNTER — Ambulatory Visit (INDEPENDENT_AMBULATORY_CARE_PROVIDER_SITE_OTHER): Payer: 59

## 2023-06-09 ENCOUNTER — Ambulatory Visit: Payer: 59 | Admitting: Internal Medicine

## 2023-06-09 DIAGNOSIS — I739 Peripheral vascular disease, unspecified: Secondary | ICD-10-CM | POA: Diagnosis not present

## 2023-06-09 DIAGNOSIS — E0843 Diabetes mellitus due to underlying condition with diabetic autonomic (poly)neuropathy: Secondary | ICD-10-CM | POA: Diagnosis not present

## 2023-06-09 DIAGNOSIS — L97512 Non-pressure chronic ulcer of other part of right foot with fat layer exposed: Secondary | ICD-10-CM | POA: Diagnosis not present

## 2023-06-09 NOTE — Progress Notes (Signed)
Chief Complaint  Patient presents with   Diabetic Ulcer    Patient came in today for left foot diabetic ulcer, 5th toe, drainage,  A1c- 8.3    Subjective:  42 y.o. female with PMHx of diabetes mellitus presenting today for a new occurrence of an ulcer that developed in the right fifth toe.  She says that she has noticed some drainage.  She first noticed this about 2 weeks ago.  Denies a history of injury.  Presenting for further treatment and evaluation.  Currently has not done anything for treatment   Past Medical History:  Diagnosis Date   Anemia    Cataract    Cellulitis of toe of right foot 10/07/2016   Depression    Diabetes mellitus without complication [facility])    dx age 36   Diabetic foot ulcer (HCC) 10/15/2016   Fatty liver disease, nonalcoholic    Glaucoma    Hyperlipidemia    Hypertension    Macular degeneration    Morbid obesity with BMI of 50.0-59.9, adult (HCC) 05/01/2017   Neuropathy    Obesity, Class III, BMI 40-49.9 (morbid obesity) (HCC) 05/01/2017   Osteolysis, right ankle and foot 05/01/2017   Osteomyelitis (HCC) 12/13/2016   Partial blindness    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   Suicidal ideation 11/04/2017    Past Surgical History:  Procedure Laterality Date   AMPUTATION TOE Right 12/16/2016   Procedure: RIGHT FIRST TOE AMPUTATION;  Surgeon: Ancil Linsey, MD;  Location: [facility];  Service: General;  Laterality: Right;  Right first toe amputation for osteomyelitis   eye lid transplant     RETINAL LASER PROCEDURE      Allergies  Allergen Reactions   Other Anaphylaxis   Shellfish Allergy Anaphylaxis, Shortness Of Breath and Swelling    Facial Swelling   Oxycodone Nausea And Vomiting   Percocet [Oxycodone-Acetaminophen] Itching and Nausea And Vomiting     Objective/Physical Exam General: The patient is alert and oriented x3 in no acute distress.  Dermatology:  Wound #1 noted to the right fifth toe measuring approximately 1.0 x 0.3 x 0.1 cm  (LxWxD).   To the noted ulceration(s), there is no eschar. There is a moderate amount of slough, fibrin, and necrotic tissue noted. Granulation tissue and wound base is red. There is a minimal amount of serosanguineous drainage noted. There is no exposed bone muscle-tendon ligament or joint. There is no malodor. Periwound integrity is intact. Skin is warm, dry and supple bilateral lower extremities.  Vascular: VAS [place] ABI W/WO TBI 08/08/2022 ABI Findings:  +---------+------------------+-----+----------+--------+  Right   Rt Pressure (mmHg)IndexWaveform  Comment   +---------+------------------+-----+----------+--------+  Brachial 203                                        +---------+------------------+-----+----------+--------+  PTA     196               0.97 monophasic          +---------+------------------+-----+----------+--------+  DP      191               0.94 monophasic          +---------+------------------+-----+----------+--------+  Great Toe209               1.03 Normal              +---------+------------------+-----+----------+--------+   +---------+------------------+-----+----------+-------+  Left    Lt Pressure (mmHg)IndexWaveform  Comment  +---------+------------------+-----+----------+-------+  Brachial 202                                       +---------+------------------+-----+----------+-------+  PTA     112               0.55 monophasic         +---------+------------------+-----+----------+-------+  DP      200               0.99 monophasic         +---------+------------------+-----+----------+-------+  Great Toe152               0.75 Normal             +---------+------------------+-----+----------+-------+   +-------+-----------+-----------+------------+------------+  ABI/TBIToday's ABIToday's TBIPrevious ABIPrevious TBI  +-------+-----------+-----------+------------+------------+  Right 0.97        1.03       1.22                      +-------+-----------+-----------+------------+------------+  Left  0.99       0.75       1.36                      +-------+-----------+-----------+------------+------------+  Arterial wall calcification precludes accurate ankle pressures and ABIs.  Bilateral ABIs appear essentially unchanged.    Summary:  Right: Resting right ankle-brachial index is within normal range. The  right toe-brachial index is normal.   Left: Resting left ankle-brachial index is within normal range. The left  toe-brachial index is normal.    Neurological: Light touch and protective threshold absent  Musculoskeletal Exam: Prior amputation right great toe  Radiographic exam RT foot 06/09/2023: Absence of the right great toe noted at the level of the MTP.  Normal osseous mineralization.  No erosions around the fifth digit would be concerning for osteomyelitis.  Impression: Negative for osteo  Assessment: 1.  Ulcer right fifth digit secondary to diabetes mellitus 2. diabetes mellitus w/ peripheral neuropathy 3.  Peripheral vascular disease  Plan of Care:  1. Patient was evaluated.  X-rays reviewed.  Clinically or radiographically there is no indication of infection 2. medically necessary excisional debridement including subcutaneous tissue was performed using a tissue nipper and a chisel blade. Excisional debridement of all the necrotic nonviable tissue down to healthy bleeding viable tissue was performed with post-debridement measurements same as pre-. 3. the wound was cleansed and dry sterile dressing applied. 4.  Recommend triple antibiotic and a Band-Aid daily 5.  Postop shoe dispensed.  WBAT 6.  Today there was adequate perfusion and bleeding with debridement.  If the wound continues to progress without any indication of healing she may need referral over to vascular given the history of PVD 7.  Return to clinic 4 weeks   Felecia Shelling, DPM [facility]  Dr. Felecia Shelling, DPM    [address] [place], [place] [address]                Office [phone]  Fax [phone]

## 2023-06-10 ENCOUNTER — Other Ambulatory Visit: Payer: Self-pay | Admitting: Internal Medicine

## 2023-06-10 DIAGNOSIS — K219 Gastro-esophageal reflux disease without esophagitis: Secondary | ICD-10-CM

## 2023-07-07 ENCOUNTER — Ambulatory Visit: Payer: 59 | Admitting: Family Medicine

## 2023-07-07 ENCOUNTER — Ambulatory Visit: Payer: 59 | Admitting: Internal Medicine

## 2023-07-09 ENCOUNTER — Encounter: Payer: Self-pay | Admitting: Internal Medicine

## 2023-07-09 ENCOUNTER — Ambulatory Visit: Payer: 59 | Admitting: Podiatry

## 2023-07-18 ENCOUNTER — Other Ambulatory Visit: Payer: Self-pay | Admitting: Internal Medicine

## 2023-07-18 DIAGNOSIS — I10 Essential (primary) hypertension: Secondary | ICD-10-CM

## 2023-07-18 DIAGNOSIS — E1159 Type 2 diabetes mellitus with other circulatory complications: Secondary | ICD-10-CM

## 2023-07-18 DIAGNOSIS — R195 Other fecal abnormalities: Secondary | ICD-10-CM

## 2023-07-18 DIAGNOSIS — K219 Gastro-esophageal reflux disease without esophagitis: Secondary | ICD-10-CM

## 2023-07-29 ENCOUNTER — Encounter: Payer: Self-pay | Admitting: Family Medicine

## 2023-07-29 ENCOUNTER — Telehealth: Payer: Self-pay | Admitting: Internal Medicine

## 2023-07-29 ENCOUNTER — Encounter: Payer: 59 | Admitting: Family Medicine

## 2023-07-29 NOTE — Progress Notes (Signed)
I attempted to contact the patient twice regarding her tele visit. I initially called her mobile number and, upon receiving no response, subsequently reached out to her home phone. Unfortunately, I did not receive an answer on either call.

## 2023-07-29 NOTE — Telephone Encounter (Signed)
Patient calling says she has not received the link for her video visit today. Please advise Thank you

## 2023-07-30 ENCOUNTER — Ambulatory Visit: Payer: 59 | Admitting: Family Medicine

## 2023-07-30 ENCOUNTER — Telehealth (INDEPENDENT_AMBULATORY_CARE_PROVIDER_SITE_OTHER): Payer: 59 | Admitting: Family Medicine

## 2023-07-30 ENCOUNTER — Encounter: Payer: Self-pay | Admitting: Family Medicine

## 2023-07-30 ENCOUNTER — Ambulatory Visit: Payer: 59 | Admitting: Internal Medicine

## 2023-07-30 DIAGNOSIS — Z20822 Contact with and (suspected) exposure to covid-19: Secondary | ICD-10-CM | POA: Diagnosis not present

## 2023-07-30 DIAGNOSIS — B349 Viral infection, unspecified: Secondary | ICD-10-CM

## 2023-07-30 MED ORDER — GUAIFENESIN 100 MG/5ML PO LIQD
5.0000 mL | ORAL | 0 refills | Status: AC | PRN
Start: 2023-07-30 — End: ?

## 2023-07-30 MED ORDER — FLUTICASONE PROPIONATE 50 MCG/ACT NA SUSP
2.0000 | Freq: Every day | NASAL | 1 refills | Status: AC
Start: 2023-07-30 — End: ?

## 2023-07-30 NOTE — Progress Notes (Signed)
Virtual Visit via Video Note  I connected with Rachel George on 07/30/23 at  1:40 PM EDT by a video enabled telemedicine application and verified that I am speaking with the correct person using two identifiers.  Patient Location: Home Provider Location: Office/Clinic  I discussed the limitations, risks, security, and privacy concerns of performing an evaluation and management service by video and the availability of in person appointments. I also discussed with the patient that there may be a patient responsible charge related to this service. The patient expressed understanding and agreed to proceed.  Subjective: PCP: Anabel Halon, MD  Chief Complaint  Patient presents with   Nasal Congestion    Pt reports head congestion, sneezing, dark mucus, coughing, and sore throat.    HPI  The patient presents today with complaints of a sore throat, cough, nasal congestion, and feeling feverish. She reports that her symptoms began on July 26, 2023, following her cat becoming ill. She has been using over-the-counter cough and cold medications with some relief   ROS: Per HPI  Current Outpatient Medications:    fluticasone (FLONASE) 50 MCG/ACT nasal spray, Place 2 sprays into both nostrils daily., Disp: 16 g, Rfl: 1   albuterol (VENTOLIN HFA) 108 (90 Base) MCG/ACT inhaler, Inhale 2 puffs into the lungs every 6 (six) hours as needed for wheezing or shortness of breath., Disp: 8 g, Rfl: 2   atorvastatin (LIPITOR) 20 MG tablet, TAKE 1 TABLET BY MOUTH ONCE DAILY IN THE EVENING, Disp: 90 tablet, Rfl: 0   bacitracin-polymyxin b (POLYSPORIN) ophthalmic ointment, PLACE OINTMENT INTO THE RIGHT EYE FOUR TIMES DAILY, Disp: , Rfl:    Blood Glucose Monitoring Suppl (ACCU-CHEK GUIDE ME) w/Device KIT, USE TESTING SUPPLIES TO TEST 4 TIMES DAILY., Disp: 1 kit, Rfl: 0   chlorthalidone (HYGROTON) 25 MG tablet, Take 1 tablet by mouth once daily, Disp: 90 tablet, Rfl: 0   Continuous Blood Gluc  Receiver (DEXCOM G7 RECEIVER) DEVI, Please use it to check blood glucose as directed., Disp: 1 each, Rfl: 0   Continuous Blood Gluc Sensor (DEXCOM G7 SENSOR) MISC, Please use it to check blood glucose as directed., Disp: 3 each, Rfl: 5   diclofenac Sodium (VOLTAREN) 1 % GEL, Apply 2 g topically 3 (three) times daily as needed., Disp: 50 g, Rfl: 0   dorzolamide-timolol (COSOPT) 22.3-6.8 MG/ML ophthalmic solution, , Disp: , Rfl:    ferrous sulfate 324 MG TBEC, Take 65 mg by mouth., Disp: , Rfl:    gabapentin (NEURONTIN) 300 MG capsule, Take 1 capsule (300 mg total) by mouth 2 (two) times daily. For pain, Disp: 60 capsule, Rfl: 5   gentamicin cream (GARAMYCIN) 0.1 %, Apply 1 Application topically 2 (two) times daily., Disp: 30 g, Rfl: 1   guaiFENesin (ROBITUSSIN) 100 MG/5ML liquid, Take 5 mLs by mouth every 4 (four) hours as needed for cough or to loosen phlegm., Disp: 120 mL, Rfl: 0   ibuprofen (ADVIL) 600 MG tablet, Take 1 tablet (600 mg total) by mouth every 8 (eight) hours as needed for moderate pain., Disp: 30 tablet, Rfl: 0   insulin detemir (LEVEMIR FLEXPEN) 100 UNIT/ML FlexPen, INJECT 50 UNITS UNDER THE SKIN AT BEDTIME, Disp: 60 mL, Rfl: 0   isosorbide mononitrate (IMDUR) 30 MG 24 hr tablet, Take 0.5 tablets (15 mg total) by mouth daily., Disp: 45 tablet, Rfl: 1   levocetirizine (XYZAL) 5 MG tablet, Take 1 tablet (5 mg total) by mouth every evening., Disp: 15 tablet, Rfl: 1  lisinopril-hydrochlorothiazide (ZESTORETIC) 20-12.5 MG tablet, Take 1 tablet by mouth once daily, Disp: 90 tablet, Rfl: 0   loperamide (IMODIUM) 2 MG capsule, TAKE 1 CAPSULE BY MOUTH EVERY 6 HOURS, Disp: 30 capsule, Rfl: 0   LUMIGAN 0.01 % SOLN, INSTILL 1 DROP INTO LEFT EYE ONCE DAILY, Disp: , Rfl:    mupirocin ointment (BACTROBAN) 2 %, Apply 1 Application topically 2 (two) times daily., Disp: 22 g, Rfl: 0   NOVOLOG FLEXPEN 100 UNIT/ML FlexPen, INJECT 14 TO 20 UNITS INTO THE SKIN 3 (THREE) TIMES DAILY WITH MEALS., Disp: 60  mL, Rfl: 0   olopatadine (PATANOL) 0.1 % ophthalmic solution, Place 1 drop into both eyes 2 times daily., Disp: , Rfl:    omeprazole (PRILOSEC) 40 MG capsule, Take 1 capsule by mouth once daily, Disp: 30 capsule, Rfl: 0   Probiotic Product (BACID) CAPS, Take 1 capsule by mouth in the morning and at bedtime., Disp: 30 capsule, Rfl: 2   Semaglutide, 2 MG/DOSE, 8 MG/3ML SOPN, Inject 2 mg as directed once a week., Disp: 3 mL, Rfl: 5   spironolactone (ALDACTONE) 50 MG tablet, Take 1 tablet (50 mg total) by mouth 2 (two) times daily., Disp: 180 tablet, Rfl: 1  Observations/Objective: There were no vitals filed for this visit. Physical Exam  Assessment and Plan: Viral illness -     Fluticasone Propionate; Place 2 sprays into both nostrils daily.  Dispense: 16 g; Refill: 1 -     guaiFENesin; Take 5 mLs by mouth every 4 (four) hours as needed for cough or to loosen phlegm.  Dispense: 120 mL; Refill: 0  Suspected COVID-19 virus infection -     COVID-19, Flu A+B and RSV  The patient is scheduled to come to the clinic tomorrow, July 31, 2023, for COVID-19 testing. In the meantime, it is recommended to increase fluid intake and ensure ample rest. For pain, fever, or general discomfort, Tylenol or ibuprofen may be used as needed. To alleviate throat pain, warm salt water gargles should be performed 3-4 times daily. A humidifier can be used at bedtime to help with cough and nasal congestion.  Follow Up Instructions: Please follow up if symptoms do not improve. I discussed the assessment and treatment plan with the patient. The patient was provided an opportunity to ask questions, and all were answered. The patient agreed with the plan and demonstrated an understanding of the instructions.   The patient was advised to call back or seek an in-person evaluation if the symptoms worsen or if the condition fails to improve as anticipated.  The above assessment and management plan was discussed with the  patient. The patient verbalized understanding of and has agreed to the management plan.   Gilmore Laroche, FNP

## 2023-08-08 ENCOUNTER — Encounter: Payer: Self-pay | Admitting: Pharmacist

## 2023-08-13 ENCOUNTER — Ambulatory Visit (INDEPENDENT_AMBULATORY_CARE_PROVIDER_SITE_OTHER): Payer: 59 | Admitting: Internal Medicine

## 2023-08-13 ENCOUNTER — Encounter: Payer: Self-pay | Admitting: Internal Medicine

## 2023-08-13 VITALS — BP 148/80 | HR 99 | Ht 67.0 in | Wt >= 6400 oz

## 2023-08-13 DIAGNOSIS — Z794 Long term (current) use of insulin: Secondary | ICD-10-CM | POA: Diagnosis not present

## 2023-08-13 DIAGNOSIS — E1165 Type 2 diabetes mellitus with hyperglycemia: Secondary | ICD-10-CM | POA: Diagnosis not present

## 2023-08-13 DIAGNOSIS — Z23 Encounter for immunization: Secondary | ICD-10-CM

## 2023-08-13 DIAGNOSIS — I10 Essential (primary) hypertension: Secondary | ICD-10-CM

## 2023-08-13 DIAGNOSIS — G4733 Obstructive sleep apnea (adult) (pediatric): Secondary | ICD-10-CM

## 2023-08-13 DIAGNOSIS — E782 Mixed hyperlipidemia: Secondary | ICD-10-CM | POA: Diagnosis not present

## 2023-08-13 MED ORDER — AMLODIPINE BESYLATE 5 MG PO TABS
5.0000 mg | ORAL_TABLET | Freq: Every day | ORAL | 1 refills | Status: AC
Start: 2023-08-13 — End: ?

## 2023-08-13 MED ORDER — SEMAGLUTIDE (2 MG/DOSE) 8 MG/3ML ~~LOC~~ SOPN
2.0000 mg | PEN_INJECTOR | SUBCUTANEOUS | 5 refills | Status: AC
Start: 2023-08-13 — End: ?

## 2023-08-13 MED ORDER — LEVEMIR FLEXPEN 100 UNIT/ML ~~LOC~~ SOPN: 60.0000 [IU] | PEN_INJECTOR | Freq: Every day | SUBCUTANEOUS | 1 refills | Status: DC

## 2023-08-13 NOTE — Assessment & Plan Note (Signed)
On Lipitor 

## 2023-08-13 NOTE — Assessment & Plan Note (Addendum)
Has seen sleep specialist, had sleep study, needs CPAP device

## 2023-08-13 NOTE — Progress Notes (Signed)
Established Patient Office Visit  Subjective:  Patient ID: Rachel George, female    DOB: Sep 09, 1981  Age: 42 y.o. MRN: [id number]  CC:  Chief Complaint  Patient presents with   Hypertension    Follow up   Diabetes    Follow up    HPI Rachel George is a 42 y.o. female with past medical history of HTN, uncontrolled DM with polyneuropathy and retinopathy, HLD, GERD and morbid obesity who presents for f/u of her chronic medical conditions.  HTN: Her blood pressure was elevated in the office today.  She has been taking lisinopril HCTZ and spironolactone.  Compliance is questionable.  Of note, she has OSA, for which she was referred to sleep specialist, and has done sleep study, but does not have CPAP device yet.  She had episodes of chest pain, for which she was referred to cardiologist, but has not seen them since the last visit. She reports chest pain when she takes Imdur (?) and has stopped taking it now. She denies any palpitations.  DM: Her HbA1c was 8.3 in 03/24, which was actually better compared to prior. But she reports that she has stopped Ozempic and has been noncompliant to diet since losing her brother recently.  She has been taking Levemir 60 units and NovoLog as per sliding scale.  She used to take Ozempic 2 mg qw. She checks her blood glucose inconsistently, and states it stays around 200 most of the time. She has not set up appointment with Dr. Fransico Him yet since last visit. Denies any polyuria or polyphagia currently.  HFpEF: She has chronic leg swelling and reports dyspnea upon minimal exertion.  Her morbid obesity is also likely contributing to her dyspnea.  She is on chlorthalidone and spironolactone currently.  Past Medical History:  Diagnosis Date   Anemia    Cataract    Cellulitis of toe of right foot 10/07/2016   Depression    Diabetes mellitus without complication 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   Suicidal ideation 11/04/2017    Past Surgical History:  Procedure Laterality Date   AMPUTATION TOE Right 12/16/2016   Procedure: RIGHT FIRST TOE AMPUTATION;  Surgeon: Ancil Linsey, MD;  Location: [facility];  Service: General;  Laterality: Right;  Right first toe amputation for osteomyelitis   eye lid transplant     RETINAL LASER PROCEDURE      Family History  Problem Relation Age of Onset   Diabetes Mother    Thyroid disease Mother    Diabetes Father     Social History   Socioeconomic History   Marital status: Legally Separated    Spouse name: Jaanai Raia   Number of children: 0   Years of education: Not on file   Highest education level: High school graduate  Occupational History   Occupation: CNA personal care giver  Tobacco Use   Smoking status: Former    Current packs/day: 0.00    Types: Cigarettes    Quit  date: 12/11/2016    Years since quitting: 6.6   Smokeless tobacco: Never  Vaping Use   Vaping status: Never Used  Substance and Sexual Activity   Alcohol use: No   Drug use: No   Sexual activity: Yes    Partners: Male    Birth control/protection: None  Other Topics Concern   Not on file  Social History Narrative   Separated from husband-married for 7 years      Lives alone right now, in a disability apartment.   Right handed      Enjoys walking       Diet: eat one meal a day- veggies, meats, low carb    Caffeine: tea-4 cups daily    Water: 4-6 bottles of water      Wears seat belt   Smoke detectors    Does not use phone while driving   Social Determinants of Health    Financial Resource Strain: Low Risk  (12/27/2022)   Overall Financial Resource Strain (CARDIA)    Difficulty of Paying Living Expenses: Not hard at all  Food Insecurity: Food Insecurity Present (12/27/2022)   Hunger Vital Sign    Worried About Running Out of Food in the Last Year: Sometimes true    Ran Out of Food in the Last Year: Sometimes true  Transportation Needs: No Transportation Needs (12/27/2022)   PRAPARE - Administrator, Civil Service (Medical): No    Lack of Transportation (Non-Medical): No  Physical Activity: Insufficiently Active (12/27/2022)   Exercise Vital Sign    Days of Exercise per Week: 3 days    Minutes of Exercise per Session: 30 min  Stress: No Stress Concern Present (12/27/2022)   [employer] of Occupational Health - Occupational Stress Questionnaire    Feeling of Stress : Not at all  Social Connections: Moderately Isolated (12/27/2022)   Social Connection and Isolation Panel [NHANES]    Frequency of Communication with Friends and Family: Three times a week    Frequency of Social Gatherings with Friends and Family: Twice a week    Attends Religious Services: More than 4 times per year    Active Member of [employer] or Organizations: No    Attends Banker Meetings: Never    Marital Status: Divorced  Catering manager Violence: Not At Risk (12/27/2022)   Humiliation, Afraid, Rape, and Kick questionnaire    Fear of Current or Ex-Partner: No    Emotionally Abused: No    Physically Abused: No    Sexually Abused: No    Outpatient Medications Prior to Visit  Medication Sig Dispense Refill   albuterol (VENTOLIN HFA) 108 (90 Base) MCG/ACT inhaler Inhale 2 puffs into the lungs every 6 (six) hours as needed for wheezing or shortness of breath. 8 g 2   atorvastatin (LIPITOR) 20 MG tablet TAKE 1 TABLET BY MOUTH ONCE DAILY IN THE EVENING 90 tablet 0   bacitracin-polymyxin b (POLYSPORIN) ophthalmic ointment PLACE OINTMENT INTO THE RIGHT EYE FOUR  TIMES DAILY     Blood Glucose Monitoring Suppl (ACCU-CHEK GUIDE ME) w/Device KIT USE TESTING SUPPLIES TO TEST 4 TIMES DAILY. 1 kit 0   chlorthalidone (HYGROTON) 25 MG tablet Take 1 tablet by mouth once daily 90 tablet 0   Continuous Blood Gluc Receiver (DEXCOM G7 RECEIVER) DEVI Please use it to check blood glucose as directed. 1 each 0   Continuous Blood Gluc Sensor (DEXCOM G7 SENSOR) MISC Please use it to check blood glucose as directed. 3 each  5   diclofenac Sodium (VOLTAREN) 1 % GEL Apply 2 g topically 3 (three) times daily as needed. 50 g 0   dorzolamide-timolol (COSOPT) 22.3-6.8 MG/ML ophthalmic solution      ferrous sulfate 324 MG TBEC Take 65 mg by mouth.     fluticasone (FLONASE) 50 MCG/ACT nasal spray Place 2 sprays into both nostrils daily. 16 g 1   gabapentin (NEURONTIN) 300 MG capsule Take 1 capsule (300 mg total) by mouth 2 (two) times daily. For pain 60 capsule 5   gentamicin cream (GARAMYCIN) 0.1 % Apply 1 Application topically 2 (two) times daily. 30 g 1   guaiFENesin (ROBITUSSIN) 100 MG/5ML liquid Take 5 mLs by mouth every 4 (four) hours as needed for cough or to loosen phlegm. 120 mL 0   ibuprofen (ADVIL) 600 MG tablet Take 1 tablet (600 mg total) by mouth every 8 (eight) hours as needed for moderate pain. 30 tablet 0   levocetirizine (XYZAL) 5 MG tablet Take 1 tablet (5 mg total) by mouth every evening. 15 tablet 1   lisinopril-hydrochlorothiazide (ZESTORETIC) 20-12.5 MG tablet Take 1 tablet by mouth once daily 90 tablet 0   loperamide (IMODIUM) 2 MG capsule TAKE 1 CAPSULE BY MOUTH EVERY 6 HOURS 30 capsule 0   LUMIGAN 0.01 % SOLN INSTILL 1 DROP INTO LEFT EYE ONCE DAILY     mupirocin ointment (BACTROBAN) 2 % Apply 1 Application topically 2 (two) times daily. 22 g 0   NOVOLOG FLEXPEN 100 UNIT/ML FlexPen INJECT 14 TO 20 UNITS INTO THE SKIN 3 (THREE) TIMES DAILY WITH MEALS. 60 mL 0   olopatadine (PATANOL) 0.1 % ophthalmic solution Place 1 drop into both eyes 2 times daily.      omeprazole (PRILOSEC) 40 MG capsule Take 1 capsule by mouth once daily 30 capsule 0   Probiotic Product (BACID) CAPS Take 1 capsule by mouth in the morning and at bedtime. 30 capsule 2   spironolactone (ALDACTONE) 50 MG tablet Take 1 tablet (50 mg total) by mouth 2 (two) times daily. 180 tablet 1   insulin detemir (LEVEMIR FLEXPEN) 100 UNIT/ML FlexPen INJECT 50 UNITS UNDER THE SKIN AT BEDTIME 60 mL 0   isosorbide mononitrate (IMDUR) 30 MG 24 hr tablet Take 0.5 tablets (15 mg total) by mouth daily. 45 tablet 1   Semaglutide, 2 MG/DOSE, 8 MG/3ML SOPN Inject 2 mg as directed once a week. 3 mL 5   No facility-administered medications prior to visit.    Allergies  Allergen Reactions   Other Anaphylaxis   Shellfish Allergy Anaphylaxis, Shortness Of Breath and Swelling    Facial Swelling   Oxycodone Nausea And Vomiting   Percocet [Oxycodone-Acetaminophen] Itching and Nausea And Vomiting    ROS Review of Systems  Constitutional:  Positive for fatigue. Negative for chills and fever.  HENT:  Negative for congestion, sinus pressure, sinus pain and sore throat.   Eyes:  Positive for visual disturbance. Negative for pain and discharge.  Respiratory:  Positive for shortness of breath. Negative for cough.   Cardiovascular:  Positive for leg swelling. Negative for chest pain and palpitations.  Gastrointestinal:  Negative for nausea and vomiting.  Endocrine: Negative for polydipsia and polyuria.  Genitourinary:  Negative for dysuria and hematuria.  Musculoskeletal:  Negative for neck pain and neck stiffness.  Skin:  Positive for rash.  Neurological:  Negative for dizziness and weakness.  Psychiatric/Behavioral:  Positive for sleep disturbance. Negative for agitation and behavioral problems. The patient is nervous/anxious.  Objective:    Physical Exam Vitals reviewed.  Constitutional:      General: She is not in acute distress.    Appearance: She is obese. She is not diaphoretic.  HENT:      Head: Normocephalic and atraumatic.     Nose: Nose normal. No congestion.     Mouth/Throat:     Mouth: Mucous membranes are moist.     Pharynx: No posterior oropharyngeal erythema.  Eyes:     General: No scleral icterus. Cardiovascular:     Rate and Rhythm: Normal rate and regular rhythm.     Heart sounds: Normal heart sounds. No murmur heard.    Comments: DPA 1+ b/l Pulmonary:     Breath sounds: No wheezing or rales.     Comments: Decreased breath sounds in lower lung fields Abdominal:     Palpations: Abdomen is soft.     Tenderness: There is no abdominal tenderness.  Musculoskeletal:     Cervical back: Neck supple. No tenderness.     Comments: S/p amputation of great toe - right LE  Skin:    General: Skin is warm.  Neurological:     General: No focal deficit present.     Mental Status: She is alert and oriented to person, place, and time.     Sensory: Sensory deficit (B/l feet) present.     Motor: Weakness (B/l feet - 4/5) present.  Psychiatric:        Mood and Affect: Mood normal.        Behavior: Behavior normal.     BP (!) 148/80 (BP Location: Left Arm)   Pulse 99   Ht 5\' 7"  (1.702 m)   Wt (!) 451 lb 9.6 oz (204.8 kg)   SpO2 94%   BMI 70.73 kg/m  Wt Readings from Last 3 Encounters:  08/13/23 (!) 451 lb 9.6 oz (204.8 kg)  07/29/23 (!) 425 lb (192.8 kg)  03/27/23 (!) 445 lb 6.4 oz (202 kg)    Lab Results  Component Value Date   TSH 1.420 05/20/2022   Lab Results  Component Value Date   WBC 8.7 05/20/2022   HGB 11.0 (L) 05/20/2022   HCT 34.9 05/20/2022   MCV 81 05/20/2022   PLT 222 05/20/2022   Lab Results  Component Value Date   NA 140 02/13/2023   K 5.5 (H) 02/13/2023   CO2 17 (L) 02/13/2023   GLUCOSE 155 (H) 02/13/2023   BUN 34 (H) 02/13/2023   CREATININE 1.37 (H) 02/13/2023   BILITOT 0.2 02/13/2023   ALKPHOS 111 02/13/2023   AST 12 02/13/2023   ALT 11 02/13/2023   PROT 7.4 02/13/2023   ALBUMIN 4.0 02/13/2023   CALCIUM 9.6 02/13/2023    ANIONGAP 12 07/22/2019   EGFR 49 (L) 02/13/2023   Lab Results  Component Value Date   CHOL 121 02/13/2023   Lab Results  Component Value Date   HDL 43 02/13/2023   Lab Results  Component Value Date   LDLCALC 60 02/13/2023   Lab Results  Component Value Date   TRIG 92 02/13/2023   Lab Results  Component Value Date   CHOLHDL 2.8 02/13/2023   Lab Results  Component Value Date   HGBA1C 8.3 (H) 02/13/2023      Assessment & Plan:   Problem List Items Addressed This Visit       Cardiovascular and Mediastinum   Essential hypertension, benign - Primary    BP Readings from Last 1 Encounters:  08/13/23 Marland Kitchen)  148/80   Uncontrolled with Lisinopril-hydrochlorothiazide, Chlorthalidone and Spironolactone Had added Imdur to help with chest pain, but she stopped it Added Amlodipine 5 mg QD Could be a component of pain and OSA, had referred for sleep study, needs CPAP device - advised to contact Dr Norris Cross office as she was told that she needs sleep lab study Counseled for compliance with the medications Advised DASH diet and moderate exercise/walking as tolerated      Relevant Medications   amLODipine (NORVASC) 5 MG tablet   Other Relevant Orders   Amb ref to Medical Nutrition Therapy-[facility]NT   CMP14+EGFR     Respiratory   OSA (obstructive sleep apnea)    Has seen sleep specialist, had sleep study, needs CPAP device        Endocrine   Uncontrolled type 2 diabetes mellitus with hyperglycemia, with long-term current use of insulin (HCC)    Lab Results  Component Value Date   HGBA1C 8.3 (H) 02/13/2023   Uncontrolled On Levemir 60 U QD and Novolog sliding scale States it stays around 200, with only few readings above 300 Had added Ozempic, increased dose to 2 mg qw - but she has stopped it, needs to restart it  Tried to send Dexcom G7, but was not able to get it - advised to find preferred DME from insurance provider Saw Dr Fransico Him in the past, had referral sent  again  Advised to follow diabetic diet On ACE and statin F/u CMP and lipid panel Diabetic eye exam: Advised to follow up with Ophthalmology for diabetic eye exam      Relevant Medications   Semaglutide, 2 MG/DOSE, 8 MG/3ML SOPN   insulin detemir (LEVEMIR FLEXPEN) 100 UNIT/ML FlexPen   Other Relevant Orders   Amb ref to Medical Nutrition Therapy-[facility]   Hemoglobin A1C     Other   Mixed hyperlipidemia    On Lipitor      Relevant Medications   amLODipine (NORVASC) 5 MG tablet   Other Relevant Orders   Lipid panel   Other Visit Diagnoses     Encounter for immunization       Relevant Orders   Flu vaccine trivalent PF, 6mos and older(Flulaval,Afluria,Fluarix,Fluzone) (Completed)       Handicap placard form signed and provided.  Meds ordered this encounter  Medications   Semaglutide, 2 MG/DOSE, 8 MG/3ML SOPN    Sig: Inject 2 mg as directed once a week.    Dispense:  3 mL    Refill:  5    DOSE CHANGE   insulin detemir (LEVEMIR FLEXPEN) 100 UNIT/ML FlexPen    Sig: Inject 60 Units into the skin at bedtime.    Dispense:  54 mL    Refill:  1   amLODipine (NORVASC) 5 MG tablet    Sig: Take 1 tablet (5 mg total) by mouth daily.    Dispense:  90 tablet    Refill:  1    Follow-up: Return in about 3 months (around 11/12/2023) for DM and HTN.    Anabel Halon, MD

## 2023-08-13 NOTE — Assessment & Plan Note (Addendum)
Lab Results  Component Value Date   HGBA1C 8.3 (H) 02/13/2023   Uncontrolled On Levemir 60 U QD and Novolog sliding scale States it stays around 200, with only few readings above 300 Had added Ozempic, increased dose to 2 mg qw - but she has stopped it, needs to restart it  Tried to send Dexcom G7, but was not able to get it - advised to find preferred DME from insurance provider Saw Dr Fransico Him in the past, had referral sent again  Advised to follow diabetic diet On ACE and statin F/u CMP and lipid panel Diabetic eye exam: Advised to follow up with Ophthalmology for diabetic eye exam

## 2023-08-13 NOTE — Assessment & Plan Note (Addendum)
BP Readings from Last 1 Encounters:  08/13/23 (!) 148/80   Uncontrolled with Lisinopril-hydrochlorothiazide, Chlorthalidone and Spironolactone Had added Imdur to help with chest pain, but she stopped it Added Amlodipine 5 mg QD Could be a component of pain and OSA, had referred for sleep study, needs CPAP device - advised to contact Dr Norris Cross office as she was told that she needs sleep lab study Counseled for compliance with the medications Advised DASH diet and moderate exercise/walking as tolerated

## 2023-08-13 NOTE — Patient Instructions (Addendum)
Please contact Dr Gloris Manchester Turner's office at [phone] for sleep apnea management.  Please start taking Amlodipine in addition to other medications.  Please restart taking Ozempic as prescribed.  You are being referred to Nutrition counseling clinic.

## 2023-08-15 LAB — LIPID PANEL
Chol/HDL Ratio: 2.5 ratio (ref 0.0–4.4)
Cholesterol, Total: 123 mg/dL (ref 100–199)
HDL: 49 mg/dL (ref >39)
LDL Chol Calc (NIH): 53 mg/dL (ref 0–99)
Triglycerides: 117 mg/dL (ref 0–149)
VLDL Cholesterol Cal: 21 mg/dL (ref 5–40)

## 2023-08-15 LAB — CMP14+EGFR
ALT: 12 IU/L (ref 0–32)
AST: 13 IU/L (ref 0–40)
Albumin: 3.9 g/dL (ref 3.9–4.9)
Alkaline Phosphatase: 130 IU/L — ABNORMAL HIGH (ref 44–121)
BUN/Creatinine Ratio: 16 (ref 9–23)
BUN: 25 mg/dL — ABNORMAL HIGH (ref 6–24)
Bilirubin Total: 0.2 mg/dL (ref 0.0–1.2)
CO2: 19 mmol/L — ABNORMAL LOW (ref 20–29)
Calcium: 9.1 mg/dL (ref 8.7–10.2)
Chloride: 104 mmol/L (ref 96–106)
Creatinine, Ser: 1.53 mg/dL — ABNORMAL HIGH (ref 0.57–1.00)
Globulin, Total: 3.9 g/dL (ref 1.5–4.5)
Glucose: 334 mg/dL — ABNORMAL HIGH (ref 70–99)
Potassium: 5.1 mmol/L (ref 3.5–5.2)
Sodium: 136 mmol/L (ref 134–144)
Total Protein: 7.8 g/dL (ref 6.0–8.5)
eGFR: 43 mL/min/{1.73_m2} — ABNORMAL LOW (ref >59)

## 2023-08-15 LAB — HEMOGLOBIN A1C
Est. average glucose Bld gHb Est-mCnc: 301 mg/dL
Hgb A1c MFr Bld: 12.1 % — ABNORMAL HIGH (ref 4.8–5.6)

## 2023-08-18 ENCOUNTER — Ambulatory Visit (INDEPENDENT_AMBULATORY_CARE_PROVIDER_SITE_OTHER): Payer: 59 | Admitting: Podiatry

## 2023-08-18 DIAGNOSIS — Z91199 Patient's noncompliance with other medical treatment and regimen due to unspecified reason: Secondary | ICD-10-CM

## 2023-08-18 NOTE — Progress Notes (Signed)
   Complete physical exam  Patient: Rachel George   DOB: 09/21/2000   42 y.o. Female  MRN: [id number]  Subjective:    No chief complaint on file.   Rachel George is a 42 y.o. female who presents today for a complete physical exam. She reports consuming a {diet types:17450} diet. {types:19826} She generally feels {DESC; WELL/FAIRLY WELL/POORLY:18703}. She reports sleeping {DESC; WELL/FAIRLY WELL/POORLY:18703}. She {does/does not:200015} have additional problems to discuss today.    Most recent fall risk assessment:    05/29/2022   10:42 AM  Fall Risk   Falls in the past year? 0  Number falls in past yr: 0  Injury with Fall? 0  Risk for fall due to : No Fall Risks  Follow up Falls evaluation completed     Most recent depression screenings:    05/29/2022   10:42 AM 04/19/2021   10:46 AM  PHQ 2/9 Scores  PHQ - 2 Score 0 0  PHQ- 9 Score 5     {VISON DENTAL STD PSA (Optional):27386}  {History (Optional):23778}  Patient Care Team: Jessup, Joy, NP as PCP - General (Nurse Practitioner)   Outpatient Medications Prior to Visit  Medication Sig   fluticasone (FLONASE) 50 MCG/ACT nasal spray Place 2 sprays into both nostrils in the morning and at bedtime. After 7 days, reduce to once daily.   norgestimate-ethinyl estradiol (SPRINTEC 28) 0.25-35 MG-MCG tablet Take 1 tablet by mouth daily.   Nystatin POWD Apply liberally to affected area 2 times per day   spironolactone (ALDACTONE) 100 MG tablet Take 1 tablet (100 mg total) by mouth daily.   No facility-administered medications prior to visit.    ROS        Objective:     There were no vitals taken for this visit. {Vitals History (Optional):23777}  Physical Exam   No results found for any visits on 07/04/22. {Show previous labs (optional):23779}    Assessment & Plan:    Routine Health Maintenance and Physical Exam  Immunization History  Administered Date(s) Administered   DTaP 12/05/1999, 01/31/2000,  04/10/2000, 12/25/2000, 07/10/2004   Hepatitis A 05/06/2008, 05/12/2009   Hepatitis B 09/22/1999, 10/30/1999, 04/10/2000   HiB (PRP-OMP) 12/05/1999, 01/31/2000, 04/10/2000, 12/25/2000   IPV 12/05/1999, 01/31/2000, 09/29/2000, 07/10/2004   Influenza,inj,Quad PF,6+ Mos 08/12/2014   Influenza-Unspecified 11/11/2012   MMR 09/29/2001, 07/10/2004   Meningococcal Polysaccharide 05/11/2012   Pneumococcal Conjugate-13 12/25/2000   Pneumococcal-Unspecified 04/10/2000, 06/24/2000   Tdap 05/11/2012   Varicella 09/29/2000, 05/06/2008    Health Maintenance  Topic Date Due   HIV Screening  Never done   Hepatitis C Screening  Never done   INFLUENZA VACCINE  07/02/2022   PAP-Cervical Cytology Screening  07/04/2022 (Originally 09/20/2020)   PAP SMEAR-Modifier  07/04/2022 (Originally 09/20/2020)   TETANUS/TDAP  07/04/2022 (Originally 05/11/2022)   HPV VACCINES  Discontinued   COVID-19 Vaccine  Discontinued    Discussed health benefits of physical activity, and encouraged her to engage in regular exercise appropriate for her age and condition.  Problem List Items Addressed This Visit   None Visit Diagnoses     Annual physical exam    -  Primary   Cervical cancer screening       Need for Tdap vaccination          No follow-ups on file.     Joy Jessup, NP   

## 2023-08-21 ENCOUNTER — Ambulatory Visit: Payer: 59 | Admitting: Family Medicine

## 2023-08-29 ENCOUNTER — Other Ambulatory Visit: Payer: Self-pay | Admitting: Internal Medicine

## 2023-08-29 DIAGNOSIS — K219 Gastro-esophageal reflux disease without esophagitis: Secondary | ICD-10-CM

## 2023-08-29 DIAGNOSIS — R195 Other fecal abnormalities: Secondary | ICD-10-CM

## 2023-09-03 ENCOUNTER — Ambulatory Visit: Payer: 59 | Admitting: Family Medicine

## 2023-09-30 NOTE — Progress Notes (Unsigned)
Established Patient Office Visit   Subjective  Patient ID: Rachel George, female    DOB: 10-Aug-1981  Age: 42 y.o. MRN: [id number]  No chief complaint on file.   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  HPI  Patient presents to the clinic for    ROS    Objective:     There were no vitals taken for this visit. {Vitals History (Optional):23777}  Physical Exam   No results found for any visits on 10/01/23.  The ASCVD Risk score (Arnett DK, et al., 2019) failed to calculate for the following reasons:   The valid total cholesterol range is 130 to 320 mg/dL    Assessment & Plan:  There are no diagnoses linked to this encounter.  No follow-ups on file.   Cruzita Lederer Newman Nip, FNP

## 2023-09-30 NOTE — Patient Instructions (Addendum)
        Great to see you today.  I have refilled the medication(s) we provide.    - Please take medications as prescribed. - Follow up with your primary health provider if any health concerns arises. - If symptoms worsen please contact your primary care provider and/or visit the emergency department.  

## 2023-10-01 ENCOUNTER — Ambulatory Visit (INDEPENDENT_AMBULATORY_CARE_PROVIDER_SITE_OTHER): Payer: 59 | Admitting: Family Medicine

## 2023-10-01 VITALS — BP 94/51 | HR 97 | Ht 67.0 in | Wt >= 6400 oz

## 2023-10-01 DIAGNOSIS — M25551 Pain in right hip: Secondary | ICD-10-CM | POA: Insufficient documentation

## 2023-10-01 DIAGNOSIS — F419 Anxiety disorder, unspecified: Secondary | ICD-10-CM | POA: Insufficient documentation

## 2023-10-01 MED ORDER — CYCLOBENZAPRINE HCL 5 MG PO TABS
5.0000 mg | ORAL_TABLET | Freq: Three times a day (TID) | ORAL | 1 refills | Status: AC | PRN
Start: 2023-10-01 — End: ?

## 2023-10-01 MED ORDER — PREDNISONE 20 MG PO TABS
20.0000 mg | ORAL_TABLET | Freq: Two times a day (BID) | ORAL | 0 refills | Status: AC
Start: 2023-10-01 — End: 2023-10-06

## 2023-10-01 MED ORDER — LOPERAMIDE HCL 2 MG PO CAPS: ORAL_CAPSULE | ORAL | 0 refills | Status: DC

## 2023-10-01 MED ORDER — FAMOTIDINE 20 MG PO TABS: 20.0000 mg | ORAL_TABLET | Freq: Every day | ORAL | 2 refills | Status: AC

## 2023-10-01 MED ORDER — KETOROLAC TROMETHAMINE 60 MG/2ML IM SOLN
60.0000 mg | Freq: Once | INTRAMUSCULAR | Status: AC
Start: 2023-10-01 — End: 2023-10-01
  Administered 2023-10-01: 60 mg via INTRAMUSCULAR

## 2023-10-01 NOTE — Assessment & Plan Note (Signed)
Xray Ordered awaiting results will follow up Prednisone 20 mg twice daily x 5 days Flexeril 5 mg PRN Toradol 60 mg IM injection given today Advise  non-pharmacological interventions, including the importance of rest, avoiding twisting, improper bending, and straining the lower back. I demonstrated proper body mechanics to prevent further injury and advised alternating between ice and heat therapy for relief. Stretching exercises for both the back and legs were recommended to improve flexibility and support recovery. The patient was advised to follow up if symptoms worsen or persist. The patient expressed understanding of the treatment plan, and all questions were thoroughly addressed.

## 2023-10-17 DIAGNOSIS — H3342 Traction detachment of retina, left eye: Secondary | ICD-10-CM | POA: Diagnosis not present

## 2023-10-17 DIAGNOSIS — H3341 Traction detachment of retina, right eye: Secondary | ICD-10-CM | POA: Diagnosis not present

## 2023-10-17 DIAGNOSIS — H4051X2 Glaucoma secondary to other eye disorders, right eye, moderate stage: Secondary | ICD-10-CM | POA: Diagnosis not present

## 2023-10-17 DIAGNOSIS — E11311 Type 2 diabetes mellitus with unspecified diabetic retinopathy with macular edema: Secondary | ICD-10-CM | POA: Diagnosis not present

## 2023-10-17 DIAGNOSIS — E113523 Type 2 diabetes mellitus with proliferative diabetic retinopathy with traction retinal detachment involving the macula, bilateral: Secondary | ICD-10-CM | POA: Diagnosis not present

## 2023-11-05 ENCOUNTER — Other Ambulatory Visit: Payer: Self-pay | Admitting: Family Medicine

## 2023-11-05 ENCOUNTER — Other Ambulatory Visit: Payer: Self-pay | Admitting: Internal Medicine

## 2023-11-05 DIAGNOSIS — K219 Gastro-esophageal reflux disease without esophagitis: Secondary | ICD-10-CM

## 2023-11-05 DIAGNOSIS — I10 Essential (primary) hypertension: Secondary | ICD-10-CM

## 2023-11-17 ENCOUNTER — Other Ambulatory Visit: Payer: Self-pay | Admitting: Internal Medicine

## 2023-11-17 DIAGNOSIS — E1165 Type 2 diabetes mellitus with hyperglycemia: Secondary | ICD-10-CM

## 2023-11-17 MED ORDER — LANTUS SOLOSTAR 100 UNIT/ML ~~LOC~~ SOPN: 60.0000 [IU] | PEN_INJECTOR | Freq: Every day | SUBCUTANEOUS | 3 refills | Status: DC

## 2023-11-18 ENCOUNTER — Ambulatory Visit: Payer: 59 | Admitting: Internal Medicine

## 2023-11-24 ENCOUNTER — Ambulatory Visit: Payer: 59 | Admitting: Family Medicine

## 2023-12-09 ENCOUNTER — Ambulatory Visit: Payer: 59 | Admitting: Family Medicine

## 2023-12-12 ENCOUNTER — Telehealth: Payer: Self-pay

## 2023-12-12 NOTE — Progress Notes (Signed)
 Care Guide Pharmacy Note  12/12/2023 Name: Rachel George MRN: [id number] DOB: 1981-08-07  Referred By: Tobie Suzzane POUR, MD Reason for referral: Care Coordination (TNM Diabetes. )   Rachel George is a 43 y.o. year old female who is a primary care patient of Tobie Suzzane POUR, MD.  Rachel George was referred to the pharmacist for assistance related to: DMII  Successful contact was made with the patient to discuss pharmacy services including being ready for the pharmacist to call at least 5 minutes before the scheduled appointment time and to have medication bottles and any blood pressure readings ready for review. The patient agreed to meet with the pharmacist via telephone visit on (date/time). 12/24/23 at 10:00 a.m.   Rachel George [facility] Assistant [phone]

## 2023-12-18 ENCOUNTER — Other Ambulatory Visit: Payer: Self-pay | Admitting: Internal Medicine

## 2023-12-18 DIAGNOSIS — E785 Hyperlipidemia, unspecified: Secondary | ICD-10-CM

## 2023-12-18 DIAGNOSIS — K219 Gastro-esophageal reflux disease without esophagitis: Secondary | ICD-10-CM

## 2023-12-18 DIAGNOSIS — E119 Type 2 diabetes mellitus without complications: Secondary | ICD-10-CM

## 2023-12-22 ENCOUNTER — Other Ambulatory Visit: Payer: Self-pay | Admitting: Internal Medicine

## 2023-12-22 DIAGNOSIS — E1165 Type 2 diabetes mellitus with hyperglycemia: Secondary | ICD-10-CM

## 2023-12-22 MED ORDER — INSULIN LISPRO (1 UNIT DIAL) 100 UNIT/ML (KWIKPEN)
14.0000 [IU] | PEN_INJECTOR | Freq: Three times a day (TID) | SUBCUTANEOUS | 11 refills | Status: AC
Start: 2023-12-22 — End: ?

## 2023-12-24 ENCOUNTER — Other Ambulatory Visit: Payer: Self-pay | Admitting: Pharmacist

## 2023-12-24 ENCOUNTER — Encounter: Payer: Self-pay | Admitting: Pharmacist

## 2023-12-24 DIAGNOSIS — E1165 Type 2 diabetes mellitus with hyperglycemia: Secondary | ICD-10-CM

## 2023-12-24 MED ORDER — DEXCOM G7 SENSOR MISC: 5 refills | Status: AC

## 2023-12-24 NOTE — Progress Notes (Signed)
12/24/2023 Name: Rachel George MRN: [id number] DOB: 08/17/1981  Chief Complaint  Patient presents with   Diabetes    Rachel George is a 43 y.o. year old female who presented for a telephone visit.   They were referred to the pharmacist by a quality report for assistance in managing diabetes.    Subjective:  Patient reports she is doing well and her blood sugars have improved since her last PCP visit and A1c >15.5%.  She is currently on insulin and has not tolerated other non-insulin therapies for her T2DM.  Care Team: Primary Care Provider: Anabel Halon, MD ; Next Scheduled Visit: next week   Medication Access/Adherence  Current Pharmacy:  [facility] [address] [place] - [address] [place] [address] [place] [address] [place] [address] Phone: [phone] Fax: [phone]  [facility] - [place], [place] - [address] [place] [address] Phone: [phone] Fax: [phone]  [facility] [place] (New Address) - [place], [place] - [address] AT Previously: [address], [place] [address] [place] [address] Phone: [phone] Fax: [phone]   Patient reports affordability concerns with their medications: No  Patient reports access/transportation concerns to their pharmacy: No  Patient reports adherence concerns with their medications:  No    Diabetes:  Current medications: latnus 60 units daily, humalog sliding scale  Medications tried in the past: semaglutide (reports gas, ongoing stomach)--last filled 08/29/23; Alma Friendly, levemir, trajenta  Current glucose readings: mid to high 100s per patient  Using accucheck meter & dexcom G7 CGM  Patient denies hypoglycemic s/sx including dizziness, shakiness, sweating. Patient denies hyperglycemic symptoms including polyuria, polydipsia, polyphagia, nocturia, neuropathy,  blurred vision.  Current meal patterns:  Discussed meal planning options and Plate method for healthy eating Avoid sugary drinks and desserts Incorporate balanced protein, non starchy veggies, 1 serving of carbohydrate with each meal Increase water intake Increase physical activity as able  Current physical activity: encouraged as able  Current medication access support: medicare/medicaid   Objective:  Lab Results  Component Value Date   HGBA1C 12.1 (H) 08/13/2023    Lab Results  Component Value Date   CREATININE 1.53 (H) 08/13/2023   BUN 25 (H) 08/13/2023   NA 136 08/13/2023   K 5.1 08/13/2023   CL 104 08/13/2023   CO2 19 (L) 08/13/2023    Lab Results  Component Value Date   CHOL 123 08/13/2023   HDL 49 08/13/2023   LDLCALC 53 08/13/2023   TRIG 117 08/13/2023   CHOLHDL 2.5 08/13/2023    Medications Reviewed Today     Reviewed by Danella Maiers, [facility] (Pharmacist) on 12/24/23 at 1006  Med List Status: <None>   Medication Order Taking? Sig Documenting Provider Last Dose Status Informant  albuterol (VENTOLIN HFA) 108 (90 Base) MCG/ACT inhaler [id number]  Inhale 2 puffs into the lungs every 6 (six) hours as needed for wheezing or shortness of breath. Del Newman Nip, Tenna Child, FNP  Active   amLODipine (NORVASC) 5 MG tablet [id number]  Take 1 tablet (5 mg total) by mouth daily. Anabel Halon, MD  Active   atorvastatin (LIPITOR) 20 MG tablet [id number]  TAKE 1 TABLET BY MOUTH ONCE DAILY IN THE Jerilynn Birkenhead, Earlie Lou, MD  Active   atropine (ISOPTO ATROPINE) 1 % ophthalmic solution [id number]  Apply to eye. [provider]  Active   bacitracin-polymyxin b (POLYSPORIN) ophthalmic ointment  [id number]  PLACE OINTMENT INTO THE RIGHT EYE FOUR TIMES DAILY [provider]  Active   Blood Glucose Monitoring Suppl (ACCU-CHEK GUIDE ME) w/Device KIT [id number]  USE TESTING SUPPLIES TO TEST 4 TIMES DAILY. Anabel Halon, MD  Active   brimonidine (ALPHAGAN P) 0.1 % SOLN  [id number]  Apply to eye. [provider]  Active   chlorthalidone (HYGROTON) 25 MG tablet [id number]  Take 1 tablet by mouth once daily Anabel Halon, MD  Active   Continuous Blood Gluc Receiver [facility] G7 RECEIVER) DEVI [id number]  Please use it to check blood glucose as directed.  Patient not taking: Reported on 10/01/2023   Anabel Halon, MD  Active   Continuous Blood Gluc Sensor (DEXCOM G7 SENSOR) [place] [id number]  Please use it to check blood glucose as directed.  Patient not taking: Reported on 10/01/2023   Anabel Halon, MD  Active   cyclobenzaprine (FLEXERIL) 5 MG tablet [id number]  Take 1 tablet (5 mg total) by mouth 3 (three) times daily as needed for muscle spasms. Del Newman Nip, Tenna Child, FNP  Active   diclofenac Sodium (VOLTAREN) 1 % GEL [id number]  Apply 2 g topically 3 (three) times daily as needed. Freddy Finner, NP  Active   dorzolamide-timolol (COSOPT) 22.3-6.8 MG/ML ophthalmic solution [id number]   [provider]  Active   erythromycin ophthalmic ointment [id number]  Place 1 Application into the right eye once. [provider]  Active   famotidine (PEPCID) 20 MG tablet [id number]  Take 1 tablet (20 mg total) by mouth daily. Del Newman Nip, Tenna Child, FNP  Active   ferrous sulfate 324 MG TBEC [id number]  Take 65 mg by mouth. [provider]  Active   fluticasone (FLONASE) 50 MCG/ACT nasal spray [id number]  Place 2 sprays into both nostrils daily. Gilmore Laroche, FNP  Active   gabapentin (NEURONTIN) 300 MG capsule [id number]  Take 1 capsule (300 mg total) by mouth 2 (two) times daily. For pain Anabel Halon, MD  Active   gentamicin cream (GARAMYCIN) 0.1 % [id number]  Apply 1 Application topically 2 (two) times daily. Felecia Shelling, DPM  Active   guaiFENesin (ROBITUSSIN) 100 MG/5ML liquid [id number]  Take 5 mLs by mouth every 4 (four) hours as needed for cough or to loosen phlegm. Gilmore Laroche, FNP  Active   hydrOXYzine (ATARAX) 50 MG tablet  [id number]  Take 50 mg by mouth 3 (three) times daily as needed for anxiety. [provider]  Active   insulin glargine (LANTUS SOLOSTAR) 100 UNIT/ML Solostar Pen [id number]  Inject 60 Units into the skin daily. Anabel Halon, MD  Active   insulin lispro (HUMALOG KWIKPEN) 100 UNIT/ML KwikPen [id number]  Inject 14-20 Units into the skin with breakfast, with lunch, and with evening meal. Anabel Halon, MD  Active   levocetirizine (XYZAL) 5 MG tablet [id number]  Take 1 tablet (5 mg total) by mouth every evening. Del Newman Nip, Tenna Child, FNP  Active   lisinopril (ZESTRIL) 10 MG tablet [id number]  Take 10 mg by mouth daily. [provider]  Active   lisinopril-hydrochlorothiazide (ZESTORETIC) 20-12.5 MG tablet [id number]  Take 1 tablet by mouth once daily Anabel Halon, MD  Active   loperamide (IMODIUM) 2 MG capsule [id number]  TAKE 1 CAPSULE BY MOUTH EVERY 6 HOURS Anabel Halon, MD  Active   LUMIGAN 0.01 % SOLN [id number]  INSTILL 1 DROP INTO LEFT EYE ONCE DAILY [provider]  Active   moxifloxacin (VIGAMOX)  0.5 % ophthalmic solution [id number]  Place 1 drop into the left eye. [provider]  Active   mupirocin ointment (BACTROBAN) 2 % [id number]  Apply 1 Application topically 2 (two) times daily. Anabel Halon, MD  Active   olopatadine (PATANOL) 0.1 % ophthalmic solution [id number]  Place 1 drop into both eyes 2 times daily. [provider]  Active   omeprazole (PRILOSEC) 40 MG capsule [id number]  Take 1 capsule by mouth once daily Anabel Halon, MD  Active   Probiotic Product [facility]) CAPS [id number]  Take 1 capsule by mouth in the morning and at bedtime. Anabel Halon, MD  Active   Semaglutide, 2 MG/DOSE, 8 MG/3ML Namon Cirri [id number]  Inject 2 mg as directed once a week. Anabel Halon, MD  Active   spironolactone (ALDACTONE) 50 MG tablet [id number]  Take 1 tablet (50 mg total) by mouth 2 (two) times daily. Anabel Halon, MD  Active   Vitamin D,  Ergocalciferol, (DRISDOL) 1.25 MG (50000 UNIT) CAPS capsule [id number]  Take 50,000 Units by mouth every 7 (seven) days. [provider]  Active               Assessment/Plan:   Diabetes: - Currently uncontrolled;  patient reports she is improving  Consider Mounjaro if patient willing to try  Continue insulin and adjust per dexcom g7 at PCP f/u  Dexcom g7 refills sent to [facility]  - Reviewed long term cardiovascular and renal outcomes of uncontrolled blood sugar - Reviewed goal A1c, goal fasting, and goal 2 hour post prandial glucose - Patient denies personal or family history of multiple endocrine neoplasia type 2, medullary thyroid cancer; personal history of pancreatitis or gallbladder disease. - Recommend to check glucose continuously using Dexcom G7   Follow Up Plan: PCP next week  Kieth Brightly, PharmD, BCACP, CPP Clinical Pharmacist, [facility]

## 2023-12-30 ENCOUNTER — Ambulatory Visit: Payer: Self-pay | Admitting: Internal Medicine

## 2024-01-06 ENCOUNTER — Ambulatory Visit (INDEPENDENT_AMBULATORY_CARE_PROVIDER_SITE_OTHER): Payer: 59

## 2024-01-06 VITALS — Ht 67.0 in | Wt >= 6400 oz

## 2024-01-06 DIAGNOSIS — Z0000 Encounter for general adult medical examination without abnormal findings: Secondary | ICD-10-CM

## 2024-01-06 DIAGNOSIS — Z1231 Encounter for screening mammogram for malignant neoplasm of breast: Secondary | ICD-10-CM

## 2024-01-06 NOTE — Patient Instructions (Addendum)
Rachel George , Thank you for taking time to come for your Medicare Wellness Visit. I appreciate your ongoing commitment to your health goals. Please review the following plan we discussed and let me know if I can assist you in the future.   Referrals/Orders/Follow-Ups/Clinician Recommendations: Aim for 30 minutes of exercise or brisk walking, 6-8 glasses of water, and 5 servings of fruits and vegetables each day.   This is a list of the screening recommended for you and due dates:  Health Maintenance  Topic Date Due   Pneumococcal Vaccination (1 of 2 - PCV) Never done   Pap with HPV screening  Never done   COVID-19 Vaccine (4 - 2024-25 season) 08/03/2023   Yearly kidney health urinalysis for diabetes  09/17/2023   Complete foot exam   09/17/2023   Eye exam for diabetics  01/29/2024   Hemoglobin A1C  02/10/2024   Yearly kidney function blood test for diabetes  08/12/2024   Medicare Annual Wellness Visit  01/05/2025   DTaP/Tdap/Td vaccine (2 - Td or Tdap) 08/15/2031   Flu Shot  Completed   Hepatitis C Screening  Completed   HIV Screening  Completed   HPV Vaccine  Aged Out    Advanced directives: (Declined) Advance directive discussed with you today. Even though you declined this today, please call our office should you change your mind, and we can give you the proper paperwork for you to fill out.  Next Medicare Annual Wellness Visit scheduled for next year: Yes - 01/06/2025

## 2024-01-06 NOTE — Progress Notes (Signed)
 Subjective:   Rachel George is a 43 y.o. female who presents for Medicare Annual (Subsequent) preventive examination.  Visit Complete: Virtual I connected with  Rachel George on 01/06/24 by a audio enabled telemedicine application and verified that I am speaking with the correct person using two identifiers.  Patient Location: Home  Provider Location: Office/Clinic  I discussed the limitations of evaluation and management by telemedicine. The patient expressed understanding and agreed to proceed.  Vital Signs: Because this visit was a virtual/telehealth visit, some criteria may be missing or patient reported. Any vitals not documented were not able to be obtained and vitals that have been documented are patient reported.  Cardiac Risk Factors include: hypertension;diabetes mellitus;obesity (BMI >30kg/m2);dyslipidemia     Objective:    Today's Vitals   01/06/24 1430  Weight: (!) 451 lb (204.6 kg)  Height: 5' 7 (1.702 m)   Body mass index is 70.64 kg/m.     01/06/2024    2:26 PM 12/27/2022    2:36 PM 07/21/2022    6:18 PM 11/19/2021    9:30 AM 11/10/2020   11:10 AM 07/22/2019    5:11 PM 03/28/2019    8:23 PM  Advanced Directives  Does Patient Have a Medical Advance Directive? No No No No No No   Would patient like information on creating a medical advance directive?  No - Patient declined No - Patient declined Yes (ED - Information included in AVS) No - Patient declined No - Patient declined      Information is confidential and restricted. Go to Review Flowsheets to unlock data.    Current Medications (verified) Outpatient Encounter Medications as of 01/06/2024  Medication Sig   albuterol  (VENTOLIN  HFA) 108 (90 Base) MCG/ACT inhaler Inhale 2 puffs into the lungs every 6 (six) hours as needed for wheezing or shortness of breath.   amLODipine  (NORVASC ) 5 MG tablet Take 1 tablet (5 mg total) by mouth daily.   atorvastatin  (LIPITOR) 20 MG tablet TAKE 1 TABLET  BY MOUTH ONCE DAILY IN THE EVENING   atropine  (ISOPTO ATROPINE ) 1 % ophthalmic solution Apply to eye.   bacitracin -polymyxin b  (POLYSPORIN ) ophthalmic ointment PLACE OINTMENT INTO THE RIGHT EYE FOUR TIMES DAILY   Blood Glucose Monitoring Suppl (ACCU-CHEK GUIDE ME) w/Device KIT USE TESTING SUPPLIES TO TEST 4 TIMES DAILY.   brimonidine  (ALPHAGAN  P) 0.1 % SOLN Apply to eye.   chlorthalidone  (HYGROTON ) 25 MG tablet Take 1 tablet by mouth once daily   Continuous Blood Gluc Receiver (DEXCOM G7 RECEIVER) DEVI Please use it to check blood glucose as directed.   Continuous Glucose Sensor (DEXCOM G7 SENSOR) MISC Please use it to check blood glucose as directed. DX:E11.65   cyclobenzaprine  (FLEXERIL ) 5 MG tablet Take 1 tablet (5 mg total) by mouth 3 (three) times daily as needed for muscle spasms.   diclofenac  Sodium (VOLTAREN ) 1 % GEL Apply 2 g topically 3 (three) times daily as needed.   dorzolamide -timolol  (COSOPT ) 22.3-6.8 MG/ML ophthalmic solution    erythromycin  ophthalmic ointment Place 1 Application into the right eye once.   famotidine  (PEPCID ) 20 MG tablet Take 1 tablet (20 mg total) by mouth daily.   ferrous sulfate 324 MG TBEC Take 65 mg by mouth.   fluticasone  (FLONASE ) 50 MCG/ACT nasal spray Place 2 sprays into both nostrils daily.   gabapentin  (NEURONTIN ) 300 MG capsule Take 1 capsule (300 mg total) by mouth 2 (two) times daily. For pain   gentamicin  cream (GARAMYCIN ) 0.1 % Apply 1 Application  topically 2 (two) times daily.   guaiFENesin  (ROBITUSSIN) 100 MG/5ML liquid Take 5 mLs by mouth every 4 (four) hours as needed for cough or to loosen phlegm.   hydrOXYzine  (ATARAX ) 50 MG tablet Take 50 mg by mouth 3 (three) times daily as needed for anxiety.   insulin  glargine (LANTUS  SOLOSTAR) 100 UNIT/ML Solostar Pen Inject 60 Units into the skin daily.   insulin  lispro (HUMALOG  KWIKPEN) 100 UNIT/ML KwikPen Inject 14-20 Units into the skin with breakfast, with lunch, and with evening meal.    levocetirizine (XYZAL ) 5 MG tablet Take 1 tablet (5 mg total) by mouth every evening.   lisinopril  (ZESTRIL ) 10 MG tablet Take 10 mg by mouth daily.   lisinopril -hydrochlorothiazide  (ZESTORETIC ) 20-12.5 MG tablet Take 1 tablet by mouth once daily   loperamide  (IMODIUM ) 2 MG capsule TAKE 1 CAPSULE BY MOUTH EVERY 6 HOURS   LUMIGAN 0.01 % SOLN INSTILL 1 DROP INTO LEFT EYE ONCE DAILY   moxifloxacin  (VIGAMOX ) 0.5 % ophthalmic solution Place 1 drop into the left eye.   mupirocin  ointment (BACTROBAN ) 2 % Apply 1 Application topically 2 (two) times daily.   olopatadine (PATANOL) 0.1 % ophthalmic solution Place 1 drop into both eyes 2 times daily.   omeprazole  (PRILOSEC) 40 MG capsule Take 1 capsule by mouth once daily   Probiotic Product (BACID) CAPS Take 1 capsule by mouth in the morning and at bedtime.   Semaglutide , 2 MG/DOSE, 8 MG/3ML SOPN Inject 2 mg as directed once a week.   spironolactone  (ALDACTONE ) 50 MG tablet Take 1 tablet (50 mg total) by mouth 2 (two) times daily.   Vitamin D , Ergocalciferol , (DRISDOL ) 1.25 MG (50000 UNIT) CAPS capsule Take 50,000 Units by mouth every 7 (seven) days.   No facility-administered encounter medications on file as of 01/06/2024.    Allergies (verified) Other, Shellfish allergy, Oxycodone , Percocet [oxycodone -acetaminophen ], and Metformin  and related   History: Past Medical History:  Diagnosis Date   Anemia    Cataract    Cellulitis of toe of right foot 10/07/2016   Depression    Diabetes mellitus without complication [facility])    dx age 21   Diabetic foot ulcer (HCC) 10/15/2016   Fatty liver disease, nonalcoholic    Glaucoma    Hyperlipidemia    Hypertension    Macular degeneration    Morbid obesity with BMI of 50.0-59.9, adult (HCC) 05/01/2017   Neuropathy    Obesity, Class III, BMI 40-49.9 (morbid obesity) (HCC) 05/01/2017   Osteolysis, right ankle and foot 05/01/2017   Osteomyelitis (HCC) 12/13/2016   Partial blindness    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    R great toe   Suicidal ideation 11/04/2017   Past Surgical History:  Procedure Laterality Date   AMPUTATION TOE Right 12/16/2016   Procedure: RIGHT FIRST TOE AMPUTATION;  Surgeon: Selinda Artist Moats, MD;  Location: [facility];  Service: General;  Laterality: Right;  Right first toe amputation for osteomyelitis   eye lid transplant     RETINAL LASER PROCEDURE     Family History  Problem Relation Age of Onset   Diabetes Mother    Thyroid  disease Mother    Diabetes Father    Social History   Socioeconomic History   Marital status: Legally Separated    Spouse name: Megahn Killings   Number of children: 0   Years of education: Not on file   Highest education level: High school graduate  Occupational History   Occupation: CNA personal care giver  Tobacco Use  Smoking status: Former    Current packs/day: 0.00    Types: Cigarettes    Quit date: 12/11/2016    Years since quitting: 7.0   Smokeless tobacco: Never  Vaping Use   Vaping status: Never Used  Substance and Sexual Activity   Alcohol  use: No   Drug use: No   Sexual activity: Yes    Partners: Male    Birth control/protection: None  Other Topics Concern   Not on file  Social History Narrative   Separated from husband-married for 7 years      Lives alone right now, in a disability apartment.   Right handed      Enjoys walking       Diet: eat one meal a day- veggies, meats, low carb    Caffeine: tea-4 cups daily    Water: 4-6 bottles of water      Wears seat belt   Smoke detectors    Does not use phone while driving   Social Drivers of Health   Financial Resource Strain: Low Risk  (01/06/2024)   Overall Financial Resource Strain (CARDIA)    Difficulty of Paying Living Expenses: Not hard at all  Food Insecurity: No Food Insecurity (01/06/2024)   Hunger Vital Sign    Worried About Running Out of Food in the Last Year: Never true    Ran Out of Food in the Last Year: Never true  Transportation Needs: No Transportation  Needs (01/06/2024)   PRAPARE - Administrator, Civil Service (Medical): No    Lack of Transportation (Non-Medical): No  Physical Activity: Sufficiently Active (01/06/2024)   Exercise Vital Sign    Days of Exercise per Week: 4 days    Minutes of Exercise per Session: 40 min  Stress: No Stress Concern Present (01/06/2024)   [employer] of Occupational Health - Occupational Stress Questionnaire    Feeling of Stress : Not at all  Social Connections: Moderately Isolated (01/06/2024)   Social Connection and Isolation Panel [NHANES]    Frequency of Communication with Friends and Family: More than three times a week    Frequency of Social Gatherings with Friends and Family: Once a week    Attends Religious Services: More than 4 times per year    Active Member of [employer] or Organizations: No    Attends Banker Meetings: Never    Marital Status: Separated    Tobacco Counseling Counseling given: Not Answered   Clinical Intake:  Pre-visit preparation completed: Yes  Pain : No/denies pain     BMI - recorded: 70.64 Nutritional Status: BMI > 30  Obese Nutritional Risks: None Diabetes: Yes CBG done?: Yes (checked this morning Fasting Glucose - 92) CBG resulted in Enter/ Edit results?: Yes (fasting - 92)  How often do you need to have someone help you when you read instructions, pamphlets, or other written materials from your doctor or pharmacy?: 1 - Never  Interpreter Needed?: No  Information entered by :: Verdie Saba, CMA   Activities of Daily Living    01/06/2024    2:37 PM  In your present state of health, do you have any difficulty performing the following activities:  Hearing? 0  Vision? 0  Difficulty concentrating or making decisions? 0  Walking or climbing stairs? 0  Dressing or bathing? 0  Doing errands, shopping? 0  Preparing Food and eating ? N  Using the Toilet? N  In the past six months, have you accidently leaked urine? N  Do you have  problems with loss of bowel control? N  Managing your Medications? N  Managing your Finances? N  Housekeeping or managing your Housekeeping? N    Patient Care Team: Tobie Suzzane POUR, MD as PCP - General (Internal Medicine) Nahser, Aleene PARAS, MD as PCP - Cardiology (Cardiology)  Indicate any recent Medical Services you may have received from other than [facility] providers in the past year (date may be approximate).     Assessment:   This is a routine wellness examination for Rachel George.  Hearing/Vision screen Hearing Screening - Comments:: Denies hearing difficulties   Vision Screening - Comments:: Wears rx glasses - up to date with routine eye exams with Dr [facility]li at [facility]    Goals Addressed               This Visit's Progress     Weight (lb) < 200 lb (90.7 kg) (pt-stated)   451 lb (204.6 kg)     Patient stated that she'd like to lose about 200lbs       Depression Screen    01/06/2024    2:46 PM 10/01/2023    3:24 PM 08/13/2023    4:01 PM 07/30/2023    1:20 PM 07/29/2023    1:31 PM 03/27/2023    2:09 PM 02/13/2023    1:47 PM  PHQ 2/9 Scores  PHQ - 2 Score 0 0 2 0 0  2  PHQ- 9 Score    0 0  8  Exception Documentation      Patient refusal     Fall Risk    01/06/2024    2:39 PM 08/13/2023    4:00 PM 07/30/2023    1:20 PM 07/29/2023    1:31 PM 03/27/2023    2:09 PM  Fall Risk   Falls in the past year? 1 0 0 0 0  Number falls in past yr: 1 0 0 0 0  Comment 10/2023      Injury with Fall? 1 0 0 0 0  Comment hurt leg - seen by NP at PCP office      Risk for fall due to : Impaired balance/gait No Fall Risks No Fall Risks No Fall Risks   Follow up Falls prevention discussed Falls evaluation completed Falls evaluation completed Falls evaluation completed     MEDICARE RISK AT HOME: Medicare Risk at Home Any stairs in or around the home?: No If so, are there any without handrails?: No Home free of loose throw rugs in walkways, pet beds, electrical cords, etc?:  Yes Adequate lighting in your home to reduce risk of falls?: Yes Life alert?: No Use of a cane, walker or w/c?: No Grab bars in the bathroom?: Yes Shower chair or bench in shower?: Yes Elevated toilet seat or a handicapped toilet?: No  TIMED UP AND GO:  Was the test performed?  No    Cognitive Function:        01/06/2024    2:41 PM 12/27/2022    2:38 PM 11/19/2021    9:34 AM 11/10/2020   11:11 AM  6CIT Screen  What Year? 0 points 0 points 0 points 0 points  What month? 3 points 0 points 0 points 0 points  What time? 0 points 0 points 0 points 0 points  Count back from 20 0 points 0 points 0 points 0 points  Months in reverse 0 points 0 points 0 points 0 points  Repeat phrase 0 points 0 points  0 points 0 points  Total Score 3 points 0 points 0 points 0 points    Immunizations Immunization History  Administered Date(s) Administered   Influenza, Seasonal, Injecte, Preservative Fre 08/13/2023   Influenza,inj,Quad PF,6+ Mos 12/14/2016, 09/11/2017, 12/08/2018, 10/03/2020, 08/14/2021, 09/16/2022   Influenza-Unspecified 09/01/2017   Moderna SARS-COV2 Booster Vaccination 03/10/2021   Moderna Sars-Covid-2 Vaccination 04/04/2020, 05/02/2020   Tdap 08/14/2021    TDAP status: Up to date - 08/14/21  Flu Vaccine status: Up to date - 08/13/23  Pneumococcal vaccine status: Due, Education has been provided regarding the importance of this vaccine. Advised may receive this vaccine at local pharmacy or Health Dept. Aware to provide a copy of the vaccination record if obtained from local pharmacy or Health Dept. Verbalized acceptance and understanding.  Covid-19 vaccine status: Information provided on how to obtain vaccines.   Qualifies for Shingles Vaccine? No   Zostavax completed No   Shingrix Completed?: No.    Education has been provided regarding the importance of this vaccine. Patient has been advised to call insurance company to determine out of pocket expense if they have not yet  received this vaccine. Advised may also receive vaccine at local pharmacy or Health Dept. Verbalized acceptance and understanding.  Screening Tests Health Maintenance  Topic Date Due   Pneumococcal Vaccine 60-46 Years old (1 of 2 - PCV) Never done   Cervical Cancer Screening (HPV/Pap Cotest)  Never done   COVID-19 Vaccine (4 - 2024-25 season) 08/03/2023   Diabetic kidney evaluation - Urine ACR  09/17/2023   FOOT EXAM  09/17/2023   OPHTHALMOLOGY EXAM  01/29/2024   HEMOGLOBIN A1C  02/10/2024   Diabetic kidney evaluation - eGFR measurement  08/12/2024   Medicare Annual Wellness (AWV)  01/05/2025   DTaP/Tdap/Td (2 - Td or Tdap) 08/15/2031   INFLUENZA VACCINE  Completed   Hepatitis C Screening  Completed   HIV Screening  Completed   HPV VACCINES  Aged Out    Health Maintenance  Health Maintenance Due  Topic Date Due   Pneumococcal Vaccine 47-70 Years old (1 of 2 - PCV) Never done   Cervical Cancer Screening (HPV/Pap Cotest)  Never done   COVID-19 Vaccine (4 - 2024-25 season) 08/03/2023   Diabetic kidney evaluation - Urine ACR  09/17/2023   FOOT EXAM  09/17/2023      Mammogram status: Ordered 01/06/2024. Pt provided with contact info and advised to call to schedule appt.    Additional Screening:  Hepatitis C Screening: does qualify; Completed 09/16/2022  Vision Screening: Recommended annual ophthalmology exams for early detection of glaucoma and other disorders of the eye. Is the patient up to date with their annual eye exam?  Yes  Who is the provider or what is the name of the office in which the patient attends annual eye exams? 6 months ago - [facility] w/Dr Loreli   Dental Screening: Recommended annual dental exams for proper oral hygiene  Diabetic Foot Exam: Diabetic Foot Exam: Overdue, Pt has been advised about the importance in completing this exam. Pt is scheduled for diabetic foot exam on 09/16/22.  Community Resource Referral / Chronic Care  Management: CRR required this visit?  No   CCM required this visit?  No     Plan:     I have personally reviewed and noted the following in the patient's chart:   Medical and social history Use of alcohol , tobacco or illicit drugs  Current medications and supplements including opioid prescriptions. Patient is not  currently taking opioid prescriptions. Functional ability and status Nutritional status Physical activity Advanced directives List of other physicians Hospitalizations, surgeries, and ER visits in previous 12 months Vitals Screenings to include cognitive, depression, and falls Referrals and appointments  In addition, I have reviewed and discussed with patient certain preventive protocols, quality metrics, and best practice recommendations. A written personalized care plan for preventive services as well as general preventive health recommendations were provided to patient.     Verdie CHRISTELLA Saba, CMA   01/06/2024   After Visit Summary: (MyChart) Due to this being a telephonic visit, the after visit summary with patients personalized plan was offered to patient via MyChart   Nurse Notes: Mammogram ordered for the patient

## 2024-01-21 ENCOUNTER — Ambulatory Visit: Payer: Self-pay | Admitting: Internal Medicine

## 2024-01-26 ENCOUNTER — Ambulatory Visit: Payer: 59 | Admitting: Family Medicine

## 2024-01-30 ENCOUNTER — Other Ambulatory Visit: Payer: Self-pay | Admitting: Internal Medicine

## 2024-01-30 DIAGNOSIS — K219 Gastro-esophageal reflux disease without esophagitis: Secondary | ICD-10-CM

## 2024-02-16 ENCOUNTER — Other Ambulatory Visit: Payer: Self-pay | Admitting: Internal Medicine

## 2024-02-16 DIAGNOSIS — E1159 Type 2 diabetes mellitus with other circulatory complications: Secondary | ICD-10-CM

## 2024-02-16 DIAGNOSIS — I10 Essential (primary) hypertension: Secondary | ICD-10-CM

## 2024-02-18 ENCOUNTER — Telehealth: Payer: Self-pay

## 2024-02-18 NOTE — Telephone Encounter (Signed)
 Patient was identified as falling into the [place] Measure - Diabetes.   Patient was: Appointment scheduled for lab or office visit for A1c.

## 2024-03-03 ENCOUNTER — Ambulatory Visit (HOSPITAL_COMMUNITY)

## 2024-03-10 ENCOUNTER — Other Ambulatory Visit: Payer: Self-pay | Admitting: Internal Medicine

## 2024-03-10 DIAGNOSIS — S81801A Unspecified open wound, right lower leg, initial encounter: Secondary | ICD-10-CM

## 2024-03-10 DIAGNOSIS — K219 Gastro-esophageal reflux disease without esophagitis: Secondary | ICD-10-CM

## 2024-03-11 ENCOUNTER — Ambulatory Visit: Payer: 59 | Admitting: Family Medicine

## 2024-03-17 ENCOUNTER — Other Ambulatory Visit: Payer: Self-pay | Admitting: Internal Medicine

## 2024-03-17 ENCOUNTER — Ambulatory Visit: Payer: 59 | Admitting: Internal Medicine

## 2024-03-17 DIAGNOSIS — E785 Hyperlipidemia, unspecified: Secondary | ICD-10-CM

## 2024-03-19 DIAGNOSIS — E1142 Type 2 diabetes mellitus with diabetic polyneuropathy: Secondary | ICD-10-CM | POA: Diagnosis not present

## 2024-03-19 DIAGNOSIS — E1122 Type 2 diabetes mellitus with diabetic chronic kidney disease: Secondary | ICD-10-CM | POA: Diagnosis not present

## 2024-03-19 DIAGNOSIS — M25551 Pain in right hip: Secondary | ICD-10-CM | POA: Diagnosis not present

## 2024-03-19 DIAGNOSIS — N1832 Chronic kidney disease, stage 3b: Secondary | ICD-10-CM | POA: Diagnosis not present

## 2024-03-19 DIAGNOSIS — M7061 Trochanteric bursitis, right hip: Secondary | ICD-10-CM | POA: Diagnosis not present

## 2024-03-22 ENCOUNTER — Ambulatory Visit: Admitting: Internal Medicine

## 2024-04-07 ENCOUNTER — Other Ambulatory Visit: Payer: Self-pay | Admitting: Internal Medicine

## 2024-04-07 DIAGNOSIS — K219 Gastro-esophageal reflux disease without esophagitis: Secondary | ICD-10-CM

## 2024-05-19 ENCOUNTER — Other Ambulatory Visit: Payer: Self-pay | Admitting: Internal Medicine

## 2024-05-19 DIAGNOSIS — I10 Essential (primary) hypertension: Secondary | ICD-10-CM

## 2024-05-20 ENCOUNTER — Ambulatory Visit: Admitting: Family Medicine

## 2024-05-24 ENCOUNTER — Ambulatory Visit: Admitting: Internal Medicine

## 2024-05-26 ENCOUNTER — Other Ambulatory Visit: Payer: Self-pay | Admitting: Internal Medicine

## 2024-05-26 DIAGNOSIS — K219 Gastro-esophageal reflux disease without esophagitis: Secondary | ICD-10-CM

## 2024-05-26 DIAGNOSIS — I152 Hypertension secondary to endocrine disorders: Secondary | ICD-10-CM

## 2024-05-31 ENCOUNTER — Ambulatory Visit: Payer: Self-pay | Admitting: *Deleted

## 2024-05-31 ENCOUNTER — Ambulatory Visit: Admitting: Internal Medicine

## 2024-05-31 NOTE — Telephone Encounter (Signed)
 Message from Lamont C sent at 05/31/2024 10:15 AM EDT  Summary: left leg discomfort   The patient would like to be contacted to discuss ongoing discomfort and mobility concerns in their left leg. The patient shares that they have a history of ongoing concerns related to the leg. Please contact the patient further if/when possible          Call History  Contact Date/Time Type Contact Phone/Fax By  05/31/2024 10:11 AM EDT Phone (Incoming) Casimir Darice Doffing (Self) [phone] Lorretta Rosina LABOR

## 2024-05-31 NOTE — Telephone Encounter (Signed)
  3rd attempt to reach pt: no answer: left voicemail to return our call.- routing information to PCP office for review.

## 2024-05-31 NOTE — Telephone Encounter (Signed)
First attempt to return her call.   Left a voicemail to call back.

## 2024-05-31 NOTE — Telephone Encounter (Signed)
 2nd attempt to reach pt: no answer: left voicemail to return our call.

## 2024-06-08 ENCOUNTER — Ambulatory Visit: Admitting: Internal Medicine

## 2024-06-15 ENCOUNTER — Other Ambulatory Visit: Payer: Self-pay | Admitting: Internal Medicine

## 2024-06-15 DIAGNOSIS — E785 Hyperlipidemia, unspecified: Secondary | ICD-10-CM

## 2024-07-16 ENCOUNTER — Other Ambulatory Visit: Payer: Self-pay | Admitting: Internal Medicine

## 2024-07-16 DIAGNOSIS — S81801A Unspecified open wound, right lower leg, initial encounter: Secondary | ICD-10-CM

## 2024-07-16 DIAGNOSIS — K219 Gastro-esophageal reflux disease without esophagitis: Secondary | ICD-10-CM

## 2024-07-19 ENCOUNTER — Ambulatory Visit: Admitting: Family Medicine

## 2024-07-19 ENCOUNTER — Ambulatory Visit: Admitting: Internal Medicine

## 2024-07-23 NOTE — Progress Notes (Signed)
 Pharmacy Quality Measure Review  This patient is appearing on a report for being at risk of failing the adherence measure for cholesterol (statin) medications this calendar year.   Medication: atorvastatin  20 mg daily Last fill date: 07/16/24 for 90 day supply  Insurance report was not up to date. No action needed at this time.   Woodie Jock, PharmD PGY1 Pharmacy Resident

## 2024-08-18 ENCOUNTER — Ambulatory Visit: Admitting: Internal Medicine

## 2024-08-26 ENCOUNTER — Other Ambulatory Visit: Payer: Self-pay | Admitting: Internal Medicine

## 2024-08-26 DIAGNOSIS — S81801A Unspecified open wound, right lower leg, initial encounter: Secondary | ICD-10-CM

## 2024-08-26 DIAGNOSIS — I152 Hypertension secondary to endocrine disorders: Secondary | ICD-10-CM

## 2024-08-26 DIAGNOSIS — I10 Essential (primary) hypertension: Secondary | ICD-10-CM

## 2024-09-14 ENCOUNTER — Ambulatory Visit: Admitting: Internal Medicine

## 2024-09-16 ENCOUNTER — Ambulatory Visit: Admitting: Family Medicine

## 2024-10-05 NOTE — Progress Notes (Signed)
 Romayne Ticas                                          MRN: [id number]   10/05/2024   The [facility]BCI Quality Team Specialist reviewed this patient medical record for the purposes of chart review for care gap closure. The following were reviewed: chart review for care gap closure-kidney health evaluation for diabetes:eGFR  and uACR.    VBCI Quality Team

## 2024-10-12 ENCOUNTER — Other Ambulatory Visit: Payer: Self-pay | Admitting: Internal Medicine

## 2024-10-12 DIAGNOSIS — E785 Hyperlipidemia, unspecified: Secondary | ICD-10-CM

## 2024-10-14 ENCOUNTER — Other Ambulatory Visit: Payer: Self-pay | Admitting: Internal Medicine

## 2024-10-26 ENCOUNTER — Ambulatory Visit: Admitting: Internal Medicine

## 2024-11-09 ENCOUNTER — Ambulatory Visit: Admitting: Internal Medicine

## 2024-11-12 ENCOUNTER — Other Ambulatory Visit: Payer: Self-pay | Admitting: Internal Medicine

## 2024-11-12 DIAGNOSIS — E1165 Type 2 diabetes mellitus with hyperglycemia: Secondary | ICD-10-CM

## 2024-11-17 ENCOUNTER — Other Ambulatory Visit (HOSPITAL_COMMUNITY): Payer: Self-pay

## 2024-11-24 ENCOUNTER — Ambulatory Visit: Admitting: Internal Medicine

## 2024-12-10 ENCOUNTER — Other Ambulatory Visit: Payer: Self-pay | Admitting: Internal Medicine

## 2024-12-10 DIAGNOSIS — I152 Hypertension secondary to endocrine disorders: Secondary | ICD-10-CM

## 2024-12-10 DIAGNOSIS — I10 Essential (primary) hypertension: Secondary | ICD-10-CM

## 2024-12-10 DIAGNOSIS — S81801A Unspecified open wound, right lower leg, initial encounter: Secondary | ICD-10-CM

## 2024-12-27 ENCOUNTER — Ambulatory Visit: Admitting: Podiatry

## 2025-01-03 ENCOUNTER — Telehealth: Payer: Self-pay | Admitting: Internal Medicine

## 2025-01-03 NOTE — Telephone Encounter (Unsigned)
 Copied from CRM #[id number]. Topic: Clinical - Medical Advice >> Jan 03, 2025  1:38 PM Mia F wrote: Reason for CRM: Pt has questions about the flu shot. Please call pt  [phone]

## 2025-01-04 NOTE — Telephone Encounter (Signed)
 [email or web address]

## 2025-01-06 ENCOUNTER — Encounter: Payer: 59 | Admitting: Internal Medicine

## 2025-01-07 ENCOUNTER — Ambulatory Visit

## 2025-01-11 ENCOUNTER — Ambulatory Visit: Admitting: Family Medicine

## 2025-02-08 ENCOUNTER — Ambulatory Visit: Admitting: Internal Medicine

## 2025-03-01 ENCOUNTER — Ambulatory Visit

## 2025-03-02 ENCOUNTER — Ambulatory Visit: Admitting: Nurse Practitioner
